# Patient Record
Sex: Female | Born: 1935 | Race: White | Hispanic: No | Smoking: Never smoker
Health system: Southern US, Community
[De-identification: ages and names within clinical notes are randomized; demographics above are authoritative.]

## PROBLEM LIST (undated history)

## (undated) DIAGNOSIS — C3490 Malignant neoplasm of unspecified part of unspecified bronchus or lung: Secondary | ICD-10-CM

## (undated) DIAGNOSIS — F410 Panic disorder [episodic paroxysmal anxiety] without agoraphobia: Secondary | ICD-10-CM

## (undated) DIAGNOSIS — E785 Hyperlipidemia, unspecified: Secondary | ICD-10-CM

## (undated) DIAGNOSIS — M1990 Unspecified osteoarthritis, unspecified site: Secondary | ICD-10-CM

## (undated) DIAGNOSIS — T4145XA Adverse effect of unspecified anesthetic, initial encounter: Secondary | ICD-10-CM

## (undated) DIAGNOSIS — T8859XA Other complications of anesthesia, initial encounter: Secondary | ICD-10-CM

## (undated) DIAGNOSIS — C44301 Unspecified malignant neoplasm of skin of nose: Secondary | ICD-10-CM

## (undated) DIAGNOSIS — I219 Acute myocardial infarction, unspecified: Secondary | ICD-10-CM

## (undated) DIAGNOSIS — J189 Pneumonia, unspecified organism: Secondary | ICD-10-CM

## (undated) DIAGNOSIS — I10 Essential (primary) hypertension: Secondary | ICD-10-CM

## (undated) DIAGNOSIS — I447 Left bundle-branch block, unspecified: Secondary | ICD-10-CM

## (undated) DIAGNOSIS — R918 Other nonspecific abnormal finding of lung field: Secondary | ICD-10-CM

## (undated) DIAGNOSIS — Z952 Presence of prosthetic heart valve: Secondary | ICD-10-CM

## (undated) DIAGNOSIS — S2239XA Fracture of one rib, unspecified side, initial encounter for closed fracture: Secondary | ICD-10-CM

## (undated) DIAGNOSIS — J449 Chronic obstructive pulmonary disease, unspecified: Secondary | ICD-10-CM

## (undated) HISTORY — PX: CARDIAC SURGERY: SHX584

## (undated) HISTORY — PX: TONSILLECTOMY: SUR1361

## (undated) HISTORY — PX: SKIN CANCER EXCISION: SHX779

## (undated) HISTORY — PX: BYPASS GRAFT: SHX909

## (undated) HISTORY — PX: CARDIAC VALVE REPLACEMENT: SHX585

## (undated) HISTORY — PX: AORTIC VALVE REPLACEMENT: SHX41

---

## 1957-08-23 HISTORY — PX: CARPAL TUNNEL RELEASE: SHX101

## 1961-08-23 HISTORY — PX: DILATION AND CURETTAGE OF UTERUS: SHX78

## 2011-08-24 DIAGNOSIS — C3490 Malignant neoplasm of unspecified part of unspecified bronchus or lung: Secondary | ICD-10-CM

## 2011-08-24 HISTORY — PX: LOBECTOMY: SHX5089

## 2011-08-24 HISTORY — DX: Malignant neoplasm of unspecified part of unspecified bronchus or lung: C34.90

## 2012-02-13 ENCOUNTER — Encounter (HOSPITAL_BASED_OUTPATIENT_CLINIC_OR_DEPARTMENT_OTHER): Payer: Self-pay | Admitting: *Deleted

## 2012-02-13 ENCOUNTER — Emergency Department (HOSPITAL_BASED_OUTPATIENT_CLINIC_OR_DEPARTMENT_OTHER)
Admission: EM | Admit: 2012-02-13 | Discharge: 2012-02-13 | Disposition: A | Discharge status: Other Acute Inpatient Hospital | Payer: Medicare Other | Attending: Emergency Medicine | Admitting: Emergency Medicine

## 2012-02-13 ENCOUNTER — Emergency Department (HOSPITAL_BASED_OUTPATIENT_CLINIC_OR_DEPARTMENT_OTHER): Payer: Medicare Other

## 2012-02-13 DIAGNOSIS — J45901 Unspecified asthma with (acute) exacerbation: Secondary | ICD-10-CM | POA: Insufficient documentation

## 2012-02-13 DIAGNOSIS — I10 Essential (primary) hypertension: Secondary | ICD-10-CM | POA: Insufficient documentation

## 2012-02-13 DIAGNOSIS — E785 Hyperlipidemia, unspecified: Secondary | ICD-10-CM | POA: Insufficient documentation

## 2012-02-13 DIAGNOSIS — Z7982 Long term (current) use of aspirin: Secondary | ICD-10-CM | POA: Insufficient documentation

## 2012-02-13 DIAGNOSIS — Z79899 Other long term (current) drug therapy: Secondary | ICD-10-CM | POA: Insufficient documentation

## 2012-02-13 DIAGNOSIS — D3A090 Benign carcinoid tumor of the bronchus and lung: Secondary | ICD-10-CM | POA: Insufficient documentation

## 2012-02-13 HISTORY — DX: Other nonspecific abnormal finding of lung field: R91.8

## 2012-02-13 HISTORY — DX: Essential (primary) hypertension: I10

## 2012-02-13 HISTORY — DX: Presence of prosthetic heart valve: Z95.2

## 2012-02-13 HISTORY — DX: Hyperlipidemia, unspecified: E78.5

## 2012-02-13 HISTORY — DX: Fracture of one rib, unspecified side, initial encounter for closed fracture: S22.39XA

## 2012-02-13 HISTORY — DX: Acute myocardial infarction, unspecified: I21.9

## 2012-02-13 HISTORY — DX: Panic disorder (episodic paroxysmal anxiety): F41.0

## 2012-02-13 LAB — BASIC METABOLIC PANEL
BUN: 16 mg/dL (ref 6–23)
Calcium: 9.8 mg/dL (ref 8.4–10.5)
GFR calc Af Amer: 82 mL/min — ABNORMAL LOW (ref >90)
GFR calc non Af Amer: 70 mL/min — ABNORMAL LOW (ref >90)
Potassium: 4.1 mEq/L (ref 3.5–5.1)
Sodium: 139 mEq/L (ref 135–145)

## 2012-02-13 LAB — DIFFERENTIAL
Basophils Relative: 0 % (ref 0–1)
Eosinophils Absolute: 0.3 10*3/uL (ref 0.0–0.7)
Monocytes Relative: 8 % (ref 3–12)
Neutrophils Relative %: 68 % (ref 43–77)

## 2012-02-13 LAB — CBC
MCH: 30.1 pg (ref 26.0–34.0)
MCHC: 34.3 g/dL (ref 30.0–36.0)
Platelets: 225 10*3/uL (ref 150–400)

## 2012-02-13 MED ORDER — ALBUTEROL (5 MG/ML) CONTINUOUS INHALATION SOLN
10.0000 mg/h | INHALATION_SOLUTION | RESPIRATORY_TRACT | Status: AC
  Administered 2012-02-13: 10 mg/h via RESPIRATORY_TRACT

## 2012-02-13 MED ORDER — ALBUTEROL SULFATE (5 MG/ML) 0.5% IN NEBU
5.0000 mg | INHALATION_SOLUTION | Freq: Once | RESPIRATORY_TRACT | Status: AC
  Administered 2012-02-13: 5 mg via RESPIRATORY_TRACT
  Filled 2012-02-13: qty 1

## 2012-02-13 MED ORDER — IPRATROPIUM BROMIDE 0.02 % IN SOLN
RESPIRATORY_TRACT | Status: AC
  Administered 2012-02-13: 0.5 mg
  Filled 2012-02-13: qty 2.5

## 2012-02-13 MED ORDER — ALBUTEROL SULFATE (5 MG/ML) 0.5% IN NEBU
INHALATION_SOLUTION | RESPIRATORY_TRACT | Status: AC
  Filled 2012-02-13: qty 1

## 2012-02-13 MED ORDER — ALBUTEROL SULFATE (5 MG/ML) 0.5% IN NEBU
INHALATION_SOLUTION | RESPIRATORY_TRACT | Status: AC
  Filled 2012-02-13: qty 2

## 2012-02-13 MED ORDER — METHYLPREDNISOLONE SODIUM SUCC 125 MG IJ SOLR
125.0000 mg | Freq: Once | INTRAMUSCULAR | Status: AC
  Administered 2012-02-13: 125 mg via INTRAVENOUS
  Filled 2012-02-13: qty 2

## 2012-02-13 MED ORDER — ALBUTEROL SULFATE (5 MG/ML) 0.5% IN NEBU
5.0000 mg | INHALATION_SOLUTION | Freq: Once | RESPIRATORY_TRACT | Status: AC
  Administered 2012-02-13: 5 mg via RESPIRATORY_TRACT

## 2012-02-13 MED ORDER — LORAZEPAM 1 MG PO TABS
1.0000 mg | ORAL_TABLET | Freq: Once | ORAL | Status: AC
  Administered 2012-02-13: 1 mg via ORAL
  Filled 2012-02-13: qty 1

## 2012-02-13 NOTE — ED Notes (Addendum)
Walked unit off on room air with Portable Pulse OX.  HR 105, SpO2 92% at lowest, RR 24-26 while walking, BBS wheezes

## 2012-02-13 NOTE — ED Provider Notes (Signed)
History     CSN: [id number]  Arrival date & time 02/13/12  1107   First MD Initiated Contact with Patient 02/13/12 1114      Chief Complaint  Patient presents with  . 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 She has had some SOB over the last several days, worse this morning. She denies any cough or fever. She has no prior history of asthma but states she has had some baseline SOB for years improved with albuterol during recent PFTs. No CP, vomiting, diarrhea.   Past Medical History  Diagnosis Date  . Hypertension   . Hyperlipidemia   . Panic attack   . Lung mass   . Cancer   . Heart valve replaced   . Rib fracture     Past Surgical History  Procedure Date  . Tonsillectomy   . Valve replacement     History reviewed. No pertinent family history.  History  Substance Use Topics  . Smoking status: Never Smoker   . Smokeless tobacco: Never Used  . Alcohol Use: Yes     glass of wine every couple of weeks    OB History    Grav Para Term Preterm Abortions TAB SAB Ect Mult Living                  Review of Systems All other systems reviewed and are negative except as noted in HPI.   Allergies  Fig extract  Home Medications   Current Outpatient Rx  Name Route Sig Dispense Refill  . ASPIRIN 81 MG PO CHEW Oral Chew 81 mg by mouth daily.    Marland Kitchen LISINOPRIL-HYDROCHLOROTHIAZIDE 20-12.5 MG PO TABS Oral Take 1 tablet by mouth daily.    Marland Kitchen ROSUVASTATIN CALCIUM 20 MG PO TABS Oral Take 20 mg by mouth daily.    Marland Kitchen ZOLPIDEM TARTRATE 5 MG PO TABS Oral Take 7 mg by mouth at bedtime as needed.      BP 175/84  Pulse 80  Temp 97.7 F (36.5 C) (Oral)  Resp 28  Ht 5\' 4"  (1.626 m)  Wt 170 lb (77.111 kg)  BMI 29.18 kg/m2  SpO2 100%  Physical Exam  Nursing note and vitals  reviewed. Constitutional: She is oriented to person, place, and time. She appears well-developed and well-nourished.  HENT:  Head: Normocephalic and atraumatic.  Eyes: EOM are normal. Pupils are equal, round, and reactive to light.  Neck: Normal range of motion. Neck supple.  Cardiovascular: Normal rate, normal heart sounds and intact distal pulses.   Pulmonary/Chest: Tachypnea noted. She is in respiratory distress. She has wheezes. She has no rales. She exhibits no tenderness.  Abdominal: Bowel sounds are normal. She exhibits no distension. There is no tenderness.  Musculoskeletal: Normal range of motion. She exhibits no edema and no tenderness.  Neurological: She is alert and oriented to person, place, and time. She has normal strength. No cranial nerve deficit or sensory deficit.  Skin: Skin is warm and dry. No rash noted.  Psychiatric: She has a normal mood and affect.    ED Course  Procedures (including critical care time)  Labs Reviewed  BASIC METABOLIC PANEL - Abnormal; Notable for the following:    Glucose, Bld 103 (*)     GFR calc non Af Amer 70 (*)     GFR calc Af Amer 82 (*)     All other components within  normal limits  CBC  DIFFERENTIAL   Dg Chest 2 View  02/13/2012  *RADIOLOGY REPORT*  Clinical Data: Shortness of breath. Recently diagnosed with carcinoid tumor.  CHEST - 2 VIEW  Comparison: None.  Findings: There is a rounded 2.5 cm mass in the left lower lobe likely reflecting the patient's carcinoid tumor.  No infiltrates or effusions.  Mild bronchitic lung changes.  The cardiac silhouette, mediastinal and hilar contours are normal.  There are surgical changes from bypass surgery and valve replacement surgery.  The bony thorax is intact.  IMPRESSION: 2.5 cm left lower lobe lung mass. No acute cardiopulmonary findings.  Original Report Authenticated By: P. Loralie Champagne, M.D.     No diagnosis found.    MDM  Pt given 5mg  neb and 10mg  CAT along with solumedrol and  now feeling better. Will check room air sats.     2:49 PM Pt has sats drop to 92% on RA while ambulating and became short of breath again. Will admit for further treatment. Spoke with Dr. Benard Rink at [facility] who will accept for transfer.     Cordelle Dahmen B. Bernette Mayers, MD 02/13/12 [phone]

## 2012-02-13 NOTE — ED Notes (Signed)
Pt reports SOB since this am. States that she was prescribed an inhaler this past Friday and used it twice with some relief. States she woke up this am with extreme SOB. Pt reports being diagnosed recently with a malignant mass on her lung. Is supposed to go to her MD for follow up.

## 2012-04-28 ENCOUNTER — Encounter (HOSPITAL_BASED_OUTPATIENT_CLINIC_OR_DEPARTMENT_OTHER): Payer: Self-pay | Admitting: *Deleted

## 2012-04-28 ENCOUNTER — Emergency Department (HOSPITAL_BASED_OUTPATIENT_CLINIC_OR_DEPARTMENT_OTHER)
Admission: EM | Admit: 2012-04-28 | Discharge: 2012-04-28 | Disposition: A | Discharge status: Home / Self Care | Payer: Medicare Other | Attending: Emergency Medicine | Admitting: Emergency Medicine

## 2012-04-28 ENCOUNTER — Emergency Department (HOSPITAL_BASED_OUTPATIENT_CLINIC_OR_DEPARTMENT_OTHER): Payer: Medicare Other

## 2012-04-28 DIAGNOSIS — Z85118 Personal history of other malignant neoplasm of bronchus and lung: Secondary | ICD-10-CM | POA: Insufficient documentation

## 2012-04-28 DIAGNOSIS — M549 Dorsalgia, unspecified: Secondary | ICD-10-CM | POA: Insufficient documentation

## 2012-04-28 DIAGNOSIS — Z951 Presence of aortocoronary bypass graft: Secondary | ICD-10-CM | POA: Insufficient documentation

## 2012-04-28 DIAGNOSIS — K3189 Other diseases of stomach and duodenum: Secondary | ICD-10-CM | POA: Insufficient documentation

## 2012-04-28 DIAGNOSIS — R110 Nausea: Secondary | ICD-10-CM | POA: Insufficient documentation

## 2012-04-28 DIAGNOSIS — Z954 Presence of other heart-valve replacement: Secondary | ICD-10-CM | POA: Insufficient documentation

## 2012-04-28 DIAGNOSIS — Z9889 Other specified postprocedural states: Secondary | ICD-10-CM | POA: Insufficient documentation

## 2012-04-28 DIAGNOSIS — R1013 Epigastric pain: Secondary | ICD-10-CM

## 2012-04-28 DIAGNOSIS — I10 Essential (primary) hypertension: Secondary | ICD-10-CM | POA: Insufficient documentation

## 2012-04-28 DIAGNOSIS — R109 Unspecified abdominal pain: Secondary | ICD-10-CM

## 2012-04-28 HISTORY — DX: Malignant neoplasm of unspecified part of unspecified bronchus or lung: C34.90

## 2012-04-28 LAB — CBC WITH DIFFERENTIAL/PLATELET
Basophils Absolute: 0.1 10*3/uL (ref 0.0–0.1)
Basophils Relative: 1 % (ref 0–1)
Eosinophils Absolute: 0.4 10*3/uL (ref 0.0–0.7)
Eosinophils Relative: 6 % — ABNORMAL HIGH (ref 0–5)
Lymphocytes Relative: 21 % (ref 12–46)
MCHC: 34.2 g/dL (ref 30.0–36.0)
MCV: 88.1 fL (ref 78.0–100.0)
Platelets: 242 10*3/uL (ref 150–400)
RDW: 13 % (ref 11.5–15.5)
WBC: 6.7 10*3/uL (ref 4.0–10.5)

## 2012-04-28 LAB — COMPREHENSIVE METABOLIC PANEL
ALT: 12 U/L (ref 0–35)
AST: 19 U/L (ref 0–37)
Albumin: 3.7 g/dL (ref 3.5–5.2)
CO2: 22 mEq/L (ref 19–32)
Calcium: 9.3 mg/dL (ref 8.4–10.5)
Creatinine, Ser: 0.8 mg/dL (ref 0.50–1.10)
Sodium: 131 mEq/L — ABNORMAL LOW (ref 135–145)
Total Protein: 6.2 g/dL (ref 6.0–8.3)

## 2012-04-28 MED ORDER — FLEET ENEMA 7-19 GM/118ML RE ENEM: 1.0000 | ENEMA | Freq: Once | RECTAL | Status: DC

## 2012-04-28 MED ORDER — ONDANSETRON HCL 4 MG/2ML IJ SOLN
4.0000 mg | Freq: Once | INTRAMUSCULAR | Status: AC
  Administered 2012-04-28: 4 mg via INTRAVENOUS

## 2012-04-28 MED ORDER — MILK AND MOLASSES ENEMA
Freq: Once | RECTAL | Status: DC
  Filled 2012-04-28: qty 250

## 2012-04-28 MED ORDER — SODIUM CHLORIDE 0.9 % IV BOLUS (SEPSIS)
500.0000 mL | Freq: Once | INTRAVENOUS | Status: AC
  Administered 2012-04-28: 1000 mL via INTRAVENOUS

## 2012-04-28 MED ORDER — FAMOTIDINE 20 MG PO TABS
40.0000 mg | ORAL_TABLET | Freq: Once | ORAL | Status: AC
  Administered 2012-04-28: 40 mg via ORAL
  Filled 2012-04-28: qty 1

## 2012-04-28 MED ORDER — FLEET ENEMA 7-19 GM/118ML RE ENEM
ENEMA | RECTAL | Status: AC
  Administered 2012-04-28: 1
  Filled 2012-04-28: qty 1

## 2012-04-28 MED ORDER — IOHEXOL 300 MG/ML  SOLN
100.0000 mL | Freq: Once | INTRAMUSCULAR | Status: AC | PRN
  Administered 2012-04-28: 100 mL via INTRAVENOUS

## 2012-04-28 MED ORDER — ONDANSETRON HCL 4 MG/2ML IJ SOLN
INTRAMUSCULAR | Status: AC
  Filled 2012-04-28: qty 2

## 2012-04-28 MED ORDER — FAMOTIDINE 20 MG PO TABS: 20.0000 mg | ORAL_TABLET | Freq: Two times a day (BID) | ORAL | Status: DC

## 2012-04-28 MED ORDER — ALUM & MAG HYDROXIDE-SIMETH 200-200-20 MG/5ML PO SUSP
30.0000 mL | Freq: Once | ORAL | Status: AC
  Administered 2012-04-28: 30 mL via ORAL
  Filled 2012-04-28: qty 30

## 2012-04-28 NOTE — ED Provider Notes (Signed)
History     CSN: [id number]  Arrival date & time 04/28/12  1404   First MD Initiated Contact with Patient 04/28/12 1447      Chief Complaint  Patient presents with  . Abdominal Pain    (Consider location/radiation/quality/duration/timing/severity/associated sxs/prior treatment) HPI Hartlee Amedee is a 77 y.o. female is a pertinent medical history of carcinoid tumor that was removed 3 weeks ago today at [facility]. It was removed by VATS procedure. Her recovery has been uneventful, she has had decreasing pain and scope sites and incisions.  She does have some persistent pain near the left-sided scope site inferior lateral to her scapula, and some mild tenderness to the main incision site between her ribs, the pain here is been resolving slowly. Patient has been taking oxycodone for pain over the last 3 weeks has been on a bowel regimen and docusate and MiraLax once daily. Patient has not had a satisfying bowel movement in the last 3 weeks. She has had this epigastric pain constantly since being hospitalized 3 weeks ago. Is been mild to moderate every day - however today got a little bit worse and her daughter decided to come to the emergency department. It is a burning sensation it doesn't ascend to the center of the chest, it is be associated with eating. The pain is not radiate anywhere else, patient has not taken anything for reflux, and has not received relief from her regular medicines. She does have some associated nausea with the burning sensation in the epigastric region. She denies any shortness of breath, diaphoresis, radiation to the arms with this pain.   Past Medical History  Diagnosis Date  . Hypertension   . Hyperlipidemia   . Panic attack   . Lung mass   . Cancer   . Heart valve replaced   . Rib fracture   . Myocardial infarct   . Lung cancer     Past Surgical History  Procedure Date  . Tonsillectomy   . Valve replacement   . Cardiac surgery   . Bypass graft   .  Lobectomy     History reviewed. No pertinent family history.  History  Substance Use Topics  . Smoking status: Never Smoker   . Smokeless tobacco: Never Used  . Alcohol Use: Yes     glass of wine every couple of weeks    OB History    Grav Para Term Preterm Abortions TAB SAB Ect Mult Living                  Review of Systems  Allergies  Fig extract and Sulfa antibiotics  Home Medications   Current Outpatient Rx  Name Route Sig Dispense Refill  . DOCUSATE SODIUM 100 MG PO CAPS Oral Take 100 mg by mouth 2 (two) times daily.    Marland Kitchen ONDANSETRON HCL 4 MG PO TABS Oral Take 4 mg by mouth every 8 (eight) hours as needed.    . OXYCODONE HCL ER 10 MG PO TB12 Oral Take 10 mg by mouth every 12 (twelve) hours.    . ALBUTEROL SULFATE HFA 108 (90 BASE) MCG/ACT IN AERS Inhalation Inhale 2 puffs into the lungs every 6 (six) hours as needed. Patient uses 2 puffs of this medication at 6 am.    . ASPIRIN 81 MG PO CHEW Oral Chew 81 mg by mouth daily.    Marland Kitchen LISINOPRIL-HYDROCHLOROTHIAZIDE 20-12.5 MG PO TABS Oral Take 1 tablet by mouth daily.    Marland Kitchen ROSUVASTATIN CALCIUM  20 MG PO TABS Oral Take 20 mg by mouth daily.    Marland Kitchen ZOLPIDEM TARTRATE 5 MG PO TABS Oral Take 5 mg by mouth at bedtime as needed. Patient is using 7.5 milligrams of this medication.      BP 120/63  Pulse 77  Temp 97.9 F (36.6 C)  Resp 16  Ht 5\' 3"  (1.6 m)  Wt 168 lb (76.204 kg)  BMI 29.76 kg/m2  SpO2 100%  Physical Exam VITAL SIGNS:   Filed Vitals:   04/28/12 1409  BP: 120/63  Pulse: 77  Temp: 97.9 F (36.6 C)  Resp: 16   CONSTITUTIONAL: Awake, oriented, appears non-toxic HENT: Atraumatic, normocephalic, oral mucosa pink and moist, airway patent. Nares patent without drainage. External ears normal. EYES: Conjunctiva clear, EOMI, PERRLA NECK: Trachea midline, non-tender, supple CARDIOVASCULAR: Normal heart rate, Normal rhythm PULMONARY/CHEST: Clear to auscultation, no rhonchi, wheezes, or rales. Well-healed median  sternotomy scar. Very mild decrease breath sounds on the left base.. 2 cm incision inferior lateral to the left scapula well-healed and mildly tender to palpation. About 5 cm well-healed incision in the midaxillary line about the fifth rib well-healed mildly tender to palpation. ABDOMINAL: Non-distended, soft, mildly tender in the left lower quadrant - no rebound or guarding.  BS decreased. NEUROLOGIC: Non-focal, moving all four extremities, no gross sensory or motor deficits. EXTREMITIES: No clubbing, cyanosis, or edema SKIN: Warm, Dry, No erythema, No rash  ED Course  Procedures (including critical care time)  Labs Reviewed  CBC WITH DIFFERENTIAL - Abnormal; Notable for the following:    RBC 3.69 (*)     Hemoglobin 11.1 (*)     HCT 32.5 (*)     Eosinophils Relative 6 (*)     All other components within normal limits  COMPREHENSIVE METABOLIC PANEL - Abnormal; Notable for the following:    Sodium 131 (*)     Potassium 3.3 (*)     Glucose, Bld 128 (*)     GFR calc non Af Amer 70 (*)     GFR calc Af Amer 82 (*)     All other components within normal limits  TROPONIN I  LIPASE, BLOOD  LACTIC ACID, PLASMA  BILIRUBIN, DIRECT   No results found.   No diagnosis found.    MDM  Daryle Boyington is a 77 y.o. female presenting with epigastric pain and some incisional site pain. She says her incisional sites have decreased in pain since his surgery, but she's been concerned about the epigastric pain, and associated nausea which is typically resolved with Zofran but today was not. Patient has not had adequate bowel movements per her report. Patient is likely constipated. She is slightly tender in the left lower quadrant and she has slowed bowel sounds which are consistent with opioid use post op.  Pt likely is constipated but has a host of co-morbidities and is 77 years old increasing her risk for intra-abdominal problems, though her abdominal exam is benign, and my pre-test probability of  diverticulitis, viscus perforation, colitis, is extremely low.  Patient does have a heart history of MI, and does present with a left bundle branch block however her EKG shows no change from prior EKG done at [facility] 03/28/2012.  This epigastric pain could be representative of an atypical presentation of MI especially in an elderly female however this is been going on for 3 weeks, the patient's vital signs have been stable and within normal limits, and my suspicion for ACS at this point is extremely  low also.  EKG shows: A sinus rhythm 79 beats per minute with a left bundle branch block pattern the pattern is appropriately discordant she's having no chest pain makes me think this is ACS, and does not meet Sgarbossa criteria. QRS is borderline at 110 ms, otherwise normal axis normal intervals.  Patient's labs are relatively unremarkable mild anemia hemoglobin of 11.1, troponin is negative, mild decrease in sodium and potassium which may be due to dietary ounces the patient says she's not been eating very much due to her nausea and pain that occurs about 30 minutes after she eats.  Chest x-ray shows redemonstrated CABG and bowel replacement as well as small left pleural effusion with left basilar atelectasis.  I do not think this atelectasis represents consolidation.  CT is nonacute. Diverticulosis without diverticulitis, no acute inflammation. Normal stool transverse colon. And redemonstrates a small left pleural effusion and cholelithiasis as seen on prior CT as faxed by [facility] records which have been reviewed.    Patient is feeling fine right now, we'll give her some Maalox and start her on Pepcid, she can followup with her physician to reevaluate her dyspepsia. We'll give her milk of molasses enema she should get some relief out of the stool in the transverse colon. Patient has not had any acute intrathoracic or intra-abdominal process at this time.    I explained the diagnosis and have given explicit  precautions to return to the ER including chest pain, dyspnea, worsening abdominal pain or any other new or worsening symptoms. The patient understands and accepts the medical plan as it's been dictated and I have answered their questions. Discharge instructions concerning home care and prescriptions have been given.  The patient is STABLE and is discharged to home in good condition.         Jones Skene, MD 04/28/12 1721

## 2012-04-28 NOTE — ED Notes (Addendum)
Pt c/o upper abd pain and upper back pain  , recent lobectomy x 3 weeks ago for CA , little BM x 3 weeks pt taking oxycontin

## 2014-09-23 HISTORY — PX: KNEE ARTHROSCOPY: SHX127

## 2016-09-16 ENCOUNTER — Inpatient Hospital Stay (HOSPITAL_COMMUNITY): Payer: Medicare Other

## 2016-09-16 ENCOUNTER — Encounter (HOSPITAL_BASED_OUTPATIENT_CLINIC_OR_DEPARTMENT_OTHER): Payer: Self-pay

## 2016-09-16 ENCOUNTER — Inpatient Hospital Stay (HOSPITAL_BASED_OUTPATIENT_CLINIC_OR_DEPARTMENT_OTHER)
Admission: EM | Admit: 2016-09-16 | Discharge: 2016-09-19 | DRG: 417 | Disposition: A | Discharge status: Home / Self Care | Payer: Medicare Other | Attending: Internal Medicine | Admitting: Internal Medicine

## 2016-09-16 ENCOUNTER — Emergency Department (HOSPITAL_BASED_OUTPATIENT_CLINIC_OR_DEPARTMENT_OTHER): Payer: Medicare Other

## 2016-09-16 DIAGNOSIS — Z953 Presence of xenogenic heart valve: Secondary | ICD-10-CM

## 2016-09-16 DIAGNOSIS — M1990 Unspecified osteoarthritis, unspecified site: Secondary | ICD-10-CM | POA: Diagnosis not present

## 2016-09-16 DIAGNOSIS — Z952 Presence of prosthetic heart valve: Secondary | ICD-10-CM

## 2016-09-16 DIAGNOSIS — D72829 Elevated white blood cell count, unspecified: Secondary | ICD-10-CM

## 2016-09-16 DIAGNOSIS — K8000 Calculus of gallbladder with acute cholecystitis without obstruction: Principal | ICD-10-CM | POA: Diagnosis present

## 2016-09-16 DIAGNOSIS — E784 Other hyperlipidemia: Secondary | ICD-10-CM | POA: Diagnosis not present

## 2016-09-16 DIAGNOSIS — Z7982 Long term (current) use of aspirin: Secondary | ICD-10-CM | POA: Diagnosis not present

## 2016-09-16 DIAGNOSIS — R739 Hyperglycemia, unspecified: Secondary | ICD-10-CM

## 2016-09-16 DIAGNOSIS — K859 Acute pancreatitis without necrosis or infection, unspecified: Secondary | ICD-10-CM | POA: Diagnosis present

## 2016-09-16 DIAGNOSIS — R1011 Right upper quadrant pain: Secondary | ICD-10-CM | POA: Diagnosis present

## 2016-09-16 DIAGNOSIS — Z882 Allergy status to sulfonamides status: Secondary | ICD-10-CM | POA: Diagnosis not present

## 2016-09-16 DIAGNOSIS — Z951 Presence of aortocoronary bypass graft: Secondary | ICD-10-CM | POA: Diagnosis not present

## 2016-09-16 DIAGNOSIS — Z91018 Allergy to other foods: Secondary | ICD-10-CM | POA: Diagnosis not present

## 2016-09-16 DIAGNOSIS — R262 Difficulty in walking, not elsewhere classified: Secondary | ICD-10-CM

## 2016-09-16 DIAGNOSIS — J439 Emphysema, unspecified: Secondary | ICD-10-CM | POA: Diagnosis not present

## 2016-09-16 DIAGNOSIS — Z85828 Personal history of other malignant neoplasm of skin: Secondary | ICD-10-CM | POA: Diagnosis not present

## 2016-09-16 DIAGNOSIS — I10 Essential (primary) hypertension: Secondary | ICD-10-CM | POA: Diagnosis not present

## 2016-09-16 DIAGNOSIS — N281 Cyst of kidney, acquired: Secondary | ICD-10-CM | POA: Diagnosis not present

## 2016-09-16 DIAGNOSIS — J449 Chronic obstructive pulmonary disease, unspecified: Secondary | ICD-10-CM

## 2016-09-16 DIAGNOSIS — Z7951 Long term (current) use of inhaled steroids: Secondary | ICD-10-CM | POA: Diagnosis not present

## 2016-09-16 DIAGNOSIS — R011 Cardiac murmur, unspecified: Secondary | ICD-10-CM | POA: Diagnosis not present

## 2016-09-16 DIAGNOSIS — Z01818 Encounter for other preprocedural examination: Secondary | ICD-10-CM

## 2016-09-16 DIAGNOSIS — K819 Cholecystitis, unspecified: Secondary | ICD-10-CM | POA: Diagnosis not present

## 2016-09-16 DIAGNOSIS — Z85118 Personal history of other malignant neoplasm of bronchus and lung: Secondary | ICD-10-CM

## 2016-09-16 DIAGNOSIS — I119 Hypertensive heart disease without heart failure: Secondary | ICD-10-CM | POA: Diagnosis present

## 2016-09-16 DIAGNOSIS — E785 Hyperlipidemia, unspecified: Secondary | ICD-10-CM | POA: Diagnosis not present

## 2016-09-16 DIAGNOSIS — R600 Localized edema: Secondary | ICD-10-CM | POA: Diagnosis not present

## 2016-09-16 DIAGNOSIS — I252 Old myocardial infarction: Secondary | ICD-10-CM | POA: Diagnosis not present

## 2016-09-16 HISTORY — DX: Pneumonia, unspecified organism: J18.9

## 2016-09-16 HISTORY — DX: Adverse effect of unspecified anesthetic, initial encounter: T41.45XA

## 2016-09-16 HISTORY — DX: Other complications of anesthesia, initial encounter: T88.59XA

## 2016-09-16 HISTORY — DX: Unspecified malignant neoplasm of skin of nose: C44.301

## 2016-09-16 HISTORY — DX: Unspecified osteoarthritis, unspecified site: M19.90

## 2016-09-16 HISTORY — DX: Chronic obstructive pulmonary disease, unspecified: J44.9

## 2016-09-16 LAB — COMPREHENSIVE METABOLIC PANEL
ALBUMIN: 4.1 g/dL (ref 3.5–5.0)
ALT: 24 U/L (ref 14–54)
AST: 29 U/L (ref 15–41)
Alkaline Phosphatase: 76 U/L (ref 38–126)
Anion gap: 9 (ref 5–15)
BUN: 16 mg/dL (ref 6–20)
CHLORIDE: 101 mmol/L (ref 101–111)
CO2: 22 mmol/L (ref 22–32)
CREATININE: 0.84 mg/dL (ref 0.44–1.00)
Calcium: 9 mg/dL (ref 8.9–10.3)
GFR calc Af Amer: >60 mL/min (ref >60)
GLUCOSE: 157 mg/dL — AB (ref 65–99)
POTASSIUM: 3.6 mmol/L (ref 3.5–5.1)
Sodium: 132 mmol/L — ABNORMAL LOW (ref 135–145)
Total Bilirubin: 1 mg/dL (ref 0.3–1.2)
Total Protein: 6.9 g/dL (ref 6.5–8.1)

## 2016-09-16 LAB — BASIC METABOLIC PANEL
Anion gap: 10 (ref 5–15)
BUN: 11 mg/dL (ref 6–20)
CHLORIDE: 102 mmol/L (ref 101–111)
CO2: 21 mmol/L — ABNORMAL LOW (ref 22–32)
Calcium: 8.5 mg/dL — ABNORMAL LOW (ref 8.9–10.3)
Creatinine, Ser: 0.79 mg/dL (ref 0.44–1.00)
GFR calc Af Amer: >60 mL/min (ref >60)
GFR calc non Af Amer: >60 mL/min (ref >60)
Glucose, Bld: 145 mg/dL — ABNORMAL HIGH (ref 65–99)
POTASSIUM: 3.8 mmol/L (ref 3.5–5.1)
SODIUM: 133 mmol/L — AB (ref 135–145)

## 2016-09-16 LAB — CBC
HEMATOCRIT: 40.4 % (ref 36.0–46.0)
Hemoglobin: 13.8 g/dL (ref 12.0–15.0)
MCH: 30.3 pg (ref 26.0–34.0)
MCHC: 34.2 g/dL (ref 30.0–36.0)
MCV: 88.6 fL (ref 78.0–100.0)
Platelets: 175 10*3/uL (ref 150–400)
RBC: 4.56 MIL/uL (ref 3.87–5.11)
RDW: 13 % (ref 11.5–15.5)
WBC: 15.4 10*3/uL — AB (ref 4.0–10.5)

## 2016-09-16 LAB — GLUCOSE, CAPILLARY
GLUCOSE-CAPILLARY: 169 mg/dL — AB (ref 65–99)
Glucose-Capillary: 142 mg/dL — ABNORMAL HIGH (ref 65–99)

## 2016-09-16 LAB — PROTIME-INR
INR: 1.07
PROTHROMBIN TIME: 13.9 s (ref 11.4–15.2)

## 2016-09-16 LAB — CBC WITH DIFFERENTIAL/PLATELET
BASOS PCT: 0 %
Basophils Absolute: 0 10*3/uL (ref 0.0–0.1)
EOS PCT: 1 %
Eosinophils Absolute: 0.2 10*3/uL (ref 0.0–0.7)
HCT: 40.5 % (ref 36.0–46.0)
Hemoglobin: 13.9 g/dL (ref 12.0–15.0)
LYMPHS PCT: 9 %
Lymphs Abs: 1.3 10*3/uL (ref 0.7–4.0)
MCH: 30.5 pg (ref 26.0–34.0)
MCHC: 34.3 g/dL (ref 30.0–36.0)
MCV: 88.8 fL (ref 78.0–100.0)
Monocytes Absolute: 1.1 10*3/uL — ABNORMAL HIGH (ref 0.1–1.0)
Monocytes Relative: 7 %
NEUTROS ABS: 11.6 10*3/uL — AB (ref 1.7–7.7)
Neutrophils Relative %: 83 %
PLATELETS: 178 10*3/uL (ref 150–400)
RBC: 4.56 MIL/uL (ref 3.87–5.11)
RDW: 13 % (ref 11.5–15.5)
WBC: 14.2 10*3/uL — AB (ref 4.0–10.5)

## 2016-09-16 LAB — TROPONIN I: TROPONIN I: 0.05 ng/mL — AB (ref <0.03)

## 2016-09-16 LAB — APTT: APTT: 31 s (ref 24–36)

## 2016-09-16 LAB — I-STAT CG4 LACTIC ACID, ED: Lactic Acid, Venous: 1.08 mmol/L (ref 0.5–1.9)

## 2016-09-16 LAB — LIPASE, BLOOD: Lipase: 20 U/L (ref 11–51)

## 2016-09-16 LAB — BRAIN NATRIURETIC PEPTIDE: B Natriuretic Peptide: 471 pg/mL — ABNORMAL HIGH (ref 0.0–100.0)

## 2016-09-16 MED ORDER — SODIUM CHLORIDE 0.9 % IV SOLN
INTRAVENOUS | Status: DC
  Administered 2016-09-17: 11:00:00 via INTRAVENOUS

## 2016-09-16 MED ORDER — MOMETASONE FURO-FORMOTEROL FUM 200-5 MCG/ACT IN AERO
2.0000 | INHALATION_SPRAY | Freq: Two times a day (BID) | RESPIRATORY_TRACT | Status: DC
  Filled 2016-09-16 (×2): qty 8.8

## 2016-09-16 MED ORDER — FENTANYL CITRATE (PF) 100 MCG/2ML IJ SOLN
50.0000 ug | INTRAMUSCULAR | Status: DC | PRN
  Administered 2016-09-16 – 2016-09-17 (×9): 50 ug via INTRAVENOUS
  Filled 2016-09-16 (×10): qty 2

## 2016-09-16 MED ORDER — SODIUM CHLORIDE 0.9 % IV SOLN
INTRAVENOUS | Status: AC
  Administered 2016-09-16: 16:00:00 via INTRAVENOUS

## 2016-09-16 MED ORDER — PIPERACILLIN-TAZOBACTAM 3.375 G IVPB
3.3750 g | Freq: Three times a day (TID) | INTRAVENOUS | Status: DC
  Administered 2016-09-16 – 2016-09-19 (×8): 3.375 g via INTRAVENOUS
  Filled 2016-09-16 (×10): qty 50

## 2016-09-16 MED ORDER — TIOTROPIUM BROMIDE MONOHYDRATE 18 MCG IN CAPS
18.0000 ug | ORAL_CAPSULE | Freq: Every day | RESPIRATORY_TRACT | Status: DC
  Filled 2016-09-16 (×2): qty 5

## 2016-09-16 MED ORDER — ACETAMINOPHEN 325 MG PO TABS
650.0000 mg | ORAL_TABLET | Freq: Once | ORAL | Status: AC
  Administered 2016-09-16: 650 mg via ORAL
  Filled 2016-09-16: qty 2

## 2016-09-16 MED ORDER — ALBUTEROL SULFATE (2.5 MG/3ML) 0.083% IN NEBU: 2.5000 mg | INHALATION_SOLUTION | Freq: Four times a day (QID) | RESPIRATORY_TRACT | Status: DC | PRN

## 2016-09-16 MED ORDER — METRONIDAZOLE IN NACL 5-0.79 MG/ML-% IV SOLN
500.0000 mg | Freq: Once | INTRAVENOUS | Status: AC
  Administered 2016-09-16: 500 mg via INTRAVENOUS
  Filled 2016-09-16: qty 100

## 2016-09-16 MED ORDER — SODIUM CHLORIDE 0.9% FLUSH
3.0000 mL | Freq: Two times a day (BID) | INTRAVENOUS | Status: DC
  Administered 2016-09-17 – 2016-09-18 (×3): 3 mL via INTRAVENOUS

## 2016-09-16 MED ORDER — ONDANSETRON HCL 4 MG/2ML IJ SOLN
INTRAMUSCULAR | Status: AC
  Administered 2016-09-16: 4 mg
  Filled 2016-09-16: qty 2

## 2016-09-16 MED ORDER — FENTANYL CITRATE (PF) 100 MCG/2ML IJ SOLN
INTRAMUSCULAR | Status: AC
  Administered 2016-09-16: 100 ug
  Filled 2016-09-16: qty 2

## 2016-09-16 MED ORDER — LEVOFLOXACIN IN D5W 500 MG/100ML IV SOLN
500.0000 mg | Freq: Once | INTRAVENOUS | Status: AC
  Administered 2016-09-16: 500 mg via INTRAVENOUS
  Filled 2016-09-16: qty 100

## 2016-09-16 NOTE — Progress Notes (Signed)
CRITICAL VALUE STICKER  CRITICAL VALUE: Troponin 0.05  RECEIVER (on-site recipient of call):  DATE & TIME NOTIFIED: 09/16/16 1815  MESSENGER (representative from lab):  MD NOTIFIED: Marily Memos   TIME OF NOTIFICATION:1830  RESPONSE: none

## 2016-09-16 NOTE — Progress Notes (Signed)
Patient arrived from [place], alert and oriented, in moderate pain. Iv site intact, on room air, no family present at the moment. Patient oriented to room and staff, will continue to monitor.

## 2016-09-16 NOTE — ED Provider Notes (Signed)
[facility] Provider Note: Tracy Spurling, MD, [name]  CSN: [id number] MRN: [id number] ARRIVAL: 09/16/16 at [place]: [id number]   CHIEF COMPLAINT  Abdominal Pain   HISTORY OF PRESENT ILLNESS  Tracy Buck is a 82 y.o. female who has had some intermittent abdominal pain for about the last 48 hours. This was not severe until about an hour prior to arrival. The pain had her writhing and she described it is located in the upper abdomen, right upper quadrant and right lower quadrant. There was associated vomiting but no diarrhea. She was given IV Zofran and fentanyl prior to my evaluation with significant improvement in the pain. She now localizes the pain to the right upper quadrant radiating to back. Pain is worse with movement or palpation. She has no history of biliary colic and has not had a cholecystectomy.   Past Medical History:  Diagnosis Date  . Arthritis    "fingers, knees" (09/16/2016)  . Complication of anesthesia    "I often go into severe panic attacks when they try to bring me out and they have to put me back under; sometimes happens when they are starting the anesthesia also" (09/16/2016)  . COPD (chronic obstructive pulmonary disease) ([place])    "I think this has been recently dx'd" (09/16/2016)  . Heart valve replaced   . Hyperlipidemia   . Hypertension   . Lung cancer ([place]) 2013  . Lung mass   . Myocardial infarct   . Panic attack   . Pneumonia    "when I was very young" (09/16/2016)  . Rib fracture   . Skin cancer of nose     Past Surgical History:  Procedure Laterality Date  . AORTIC VALVE REPLACEMENT     "bovine valve"  . BYPASS GRAFT     does not know if she had CABG (09/16/2016)  . CARDIAC SURGERY     does not know if she had CABG (09/16/2016)  . CARDIAC VALVE REPLACEMENT    . CARPAL TUNNEL RELEASE Right 1959  . DILATION AND CURETTAGE OF UTERUS  1963  . KNEE ARTHROSCOPY Left 09/2014  . LOBECTOMY Left 2013   "took bottom lobe off"  .  SKIN CANCER EXCISION     "tip of my nose"  . TONSILLECTOMY      Family History  Problem Relation Age of Onset  . Transient ischemic attack Mother   . Colon cancer Father     Social History  Substance Use Topics  . Smoking status: Never Smoker  . Smokeless tobacco: Never Used  . Alcohol use Yes     Comment: 09/16/2016 "2-3 glasses of wine when at the beach; once/year"    Prior to Admission medications   Medication Sig Start Date End Date Taking? Authorizing Provider  acetaminophen (TYLENOL) 500 MG tablet Take 500 mg by mouth every 6 (six) hours as needed. For pain.   Yes Historical Provider, MD  albuterol (PROVENTIL HFA;VENTOLIN HFA) 108 (90 BASE) MCG/ACT inhaler Inhale 2 puffs into the lungs every 6 (six) hours as needed. Patient uses 2 puffs of this medication at 6 am.   Yes Historical Provider, MD  ALPRAZolam Duanne Moron) 0.25 MG tablet Take 0.25-0.375 mg by mouth daily as needed for anxiety. 08/20/16  Yes Historical Provider, MD  aspirin 81 MG chewable tablet Chew 81 mg by mouth daily.   Yes Historical Provider, MD  Fluticasone-Salmeterol (ADVAIR) 250-50 MCG/DOSE AEPB Inhale 1 puff into the lungs every 12 (twelve) hours.   Yes Historical Provider,  MD  lisinopril-hydrochlorothiazide (PRINZIDE,ZESTORETIC) 20-12.5 MG per tablet Take 0.5 tablets by mouth daily.    Yes Historical Provider, MD  loratadine (CLARITIN) 10 MG tablet Take 10 mg by mouth daily.   Yes Historical Provider, MD  rosuvastatin (CRESTOR) 20 MG tablet Take 20 mg by mouth daily.   Yes Historical Provider, MD  tiotropium (SPIRIVA HANDIHALER) 18 MCG inhalation capsule Place 18 mcg into inhaler and inhale daily.   Yes Historical Provider, MD  zolpidem (AMBIEN) 10 MG tablet Take 10 mg by mouth at bedtime as needed for sleep.   Yes Historical Provider, MD  albuterol (PROVENTIL) (2.5 MG/3ML) 0.083% nebulizer solution Take 2.5 mg by nebulization every 6 (six) hours as needed.    Historical Provider, MD  benzonatate (TESSALON) 200  MG capsule Take 200 mg by mouth 3 (three) times daily as needed. For cough.    Historical Provider, MD  calcium carbonate (TITRALAC) 420 MG CHEW Chew 420 mg by mouth daily as needed.    Historical Provider, MD  docusate sodium (COLACE) 100 MG capsule Take 100 mg by mouth 2 (two) times daily.    Historical Provider, MD  famotidine (PEPCID) 20 MG tablet Take 1 tablet (20 mg total) by mouth 2 (two) times daily. Patient not taking: Reported on 09/16/2016 04/28/12 09/16/16  Rhunette Croft, MD  ondansetron (ZOFRAN) 4 MG tablet Take 4 mg by mouth every 8 (eight) hours as needed.    Historical Provider, MD    Allergies Fig extract [ficus] and Sulfa antibiotics   REVIEW OF SYSTEMS  Negative except as noted here or in the History of Present Illness.   PHYSICAL EXAMINATION  Initial Vital Signs Blood pressure 170/75, temperature 97.7 F (36.5 C), temperature source Oral, resp. rate 20, height '5\' 4"'$  (1.626 m), weight 170 lb (77.1 kg), SpO2 99 %.  Examination General: Well-developed, well-nourished female in no acute distress; appearance consistent with age of record HENT: normocephalic; atraumatic Eyes: pupils equal, round and reactive to light; extraocular muscles intact Neck: supple Heart: regular rate and rhythm Lungs: clear to auscultation bilaterally Abdomen: soft; nondistended; right upper quadrant tenderness; no masses or hepatosplenomegaly; bowel sounds present Extremities: No deformity; full range of motion; pulses normal Neurologic: Awake, alert and oriented; motor function intact in all extremities and symmetric; no facial droop Skin: Warm and dry Psychiatric: Flat affect   RESULTS  Summary of this visit's results, reviewed by myself:   EKG Interpretation  Date/Time:    Ventricular Rate:    PR Interval:    QRS Duration:   QT Interval:    QTC Calculation:   R Axis:     Text Interpretation:        Laboratory Studies: Results for orders placed or performed during the  hospital encounter of 09/16/16 (from the past 24 hour(s))  CBC with Differential     Status: Abnormal   Collection Time: 09/16/16  4:40 AM  Result Value Ref Range   WBC 14.2 (H) 4.0 - 10.5 K/uL   RBC 4.56 3.87 - 5.11 MIL/uL   Hemoglobin 13.9 12.0 - 15.0 g/dL   HCT 40.5 36.0 - 46.0 %   MCV 88.8 78.0 - 100.0 fL   MCH 30.5 26.0 - 34.0 pg   MCHC 34.3 30.0 - 36.0 g/dL   RDW 13.0 11.5 - 15.5 %   Platelets 178 150 - 400 K/uL   Neutrophils Relative % 83 %   Neutro Abs 11.6 (H) 1.7 - 7.7 K/uL   Lymphocytes Relative 9 %  Lymphs Abs 1.3 0.7 - 4.0 K/uL   Monocytes Relative 7 %   Monocytes Absolute 1.1 (H) 0.1 - 1.0 K/uL   Eosinophils Relative 1 %   Eosinophils Absolute 0.2 0.0 - 0.7 K/uL   Basophils Relative 0 %   Basophils Absolute 0.0 0.0 - 0.1 K/uL  Comprehensive metabolic panel     Status: Abnormal   Collection Time: 09/16/16  4:40 AM  Result Value Ref Range   Sodium 132 (L) 135 - 145 mmol/L   Potassium 3.6 3.5 - 5.1 mmol/L   Chloride 101 101 - 111 mmol/L   CO2 22 22 - 32 mmol/L   Glucose, Bld 157 (H) 65 - 99 mg/dL   BUN 16 6 - 20 mg/dL   Creatinine, Ser 0.84 0.44 - 1.00 mg/dL   Calcium 9.0 8.9 - 10.3 mg/dL   Total Protein 6.9 6.5 - 8.1 g/dL   Albumin 4.1 3.5 - 5.0 g/dL   AST 29 15 - 41 U/L   ALT 24 14 - 54 U/L   Alkaline Phosphatase 76 38 - 126 U/L   Total Bilirubin 1.0 0.3 - 1.2 mg/dL   GFR calc non Af Amer >60 >60 mL/min   GFR calc Af Amer >60 >60 mL/min   Anion gap 9 5 - 15  Lipase, blood     Status: None   Collection Time: 09/16/16  4:40 AM  Result Value Ref Range   Lipase 20 11 - 51 U/L  I-Stat CG4 Lactic Acid, ED     Status: None   Collection Time: 09/16/16  4:51 AM  Result Value Ref Range   Lactic Acid, Venous 1.08 0.5 - 1.9 mmol/L  APTT     Status: None   Collection Time: 09/16/16  4:55 PM  Result Value Ref Range   aPTT 31 24 - 36 seconds  Protime-INR     Status: None   Collection Time: 09/16/16  4:55 PM  Result Value Ref Range   Prothrombin Time 13.9 11.4  - 15.2 seconds   INR 1.07   Troponin I     Status: Abnormal   Collection Time: 09/16/16  4:55 PM  Result Value Ref Range   Troponin I 0.05 (HH) <0.03 ng/mL  Brain natriuretic peptide     Status: Abnormal   Collection Time: 09/16/16  4:55 PM  Result Value Ref Range   B Natriuretic Peptide 471.0 (H) 0.0 - 100.0 pg/mL  Basic metabolic panel     Status: Abnormal   Collection Time: 09/16/16  4:55 PM  Result Value Ref Range   Sodium 133 (L) 135 - 145 mmol/L   Potassium 3.8 3.5 - 5.1 mmol/L   Chloride 102 101 - 111 mmol/L   CO2 21 (L) 22 - 32 mmol/L   Glucose, Bld 145 (H) 65 - 99 mg/dL   BUN 11 6 - 20 mg/dL   Creatinine, Ser 0.79 0.44 - 1.00 mg/dL   Calcium 8.5 (L) 8.9 - 10.3 mg/dL   GFR calc non Af Amer >60 >60 mL/min   GFR calc Af Amer >60 >60 mL/min   Anion gap 10 5 - 15  CBC     Status: Abnormal   Collection Time: 09/16/16  4:55 PM  Result Value Ref Range   WBC 15.4 (H) 4.0 - 10.5 K/uL   RBC 4.56 3.87 - 5.11 MIL/uL   Hemoglobin 13.8 12.0 - 15.0 g/dL   HCT 40.4 36.0 - 46.0 %   MCV 88.6 78.0 - 100.0 fL  MCH 30.3 26.0 - 34.0 pg   MCHC 34.2 30.0 - 36.0 g/dL   RDW 13.0 11.5 - 15.5 %   Platelets 175 150 - 400 K/uL  Glucose, capillary     Status: Abnormal   Collection Time: 09/16/16  5:10 PM  Result Value Ref Range   Glucose-Capillary 142 (H) 65 - 99 mg/dL   Comment 1 Notify RN   Glucose, capillary     Status: Abnormal   Collection Time: 09/16/16  9:12 PM  Result Value Ref Range   Glucose-Capillary 169 (H) 65 - 99 mg/dL   Imaging Studies: US Abdomen Complete  Result Date: 09/16/2016 CLINICAL DATA:  Zero severe right upper quadrant pain associated with nausea for the past 2 days. History of gallstones, hyperlipidemia, lung malignancy. EXAM: ABDOMEN ULTRASOUND COMPLETE COMPARISON:  Abdominal and pelvic CT scan of April 28, 2012. FINDINGS: The patient was unable to cooperate with deep breathing maneuvers which limits the study. Gallbladder: The gallbladder is adequately  distended. There are at least 2 echogenic mobile shadowing stones measuring up to 1.6 cm in diameter. Sludge is present as well. The gallbladder wall is top normal at 3 mm. There is a positive sonographic Murphy's sign reported by the technologist. Common bile duct: Diameter: 6 mm where visualized. Liver: The hepatic echotexture is normal. There is no discrete mass or ductal dilation. IVC: Bowel gas limits evaluation of the IVC. Bowel gas limits evaluation of the pancreas. Pancreas: Visualized portion unremarkable. Spleen: Size and appearance within normal limits. Right Kidney: Length: 10.4 cm. In the upper pole of the right kidney there is a cystic structure with a few internal echoes measuring 4.4 cm in diameter. A cyst was previously demonstrated on the 2013 CT scan. Left Kidney: Length: 11 cm. The echotexture is normal. There is a prominent column of Bertin. Abdominal aorta: Normal where visualized. Other findings: There is no ascites. IMPRESSION: Gallstones. Positive sonographic Murphy's sign and top-normal gallbladder wall thickness may reflect acute cholecystitis. Non-simple cystic structure in the upper pole of the right kidney. This has increased slightly in size since the CT scan of September 2013. Electronically Signed   By: David  Martinique M.D.   On: 09/16/2016 08:52   Dg Chest Port 1 View  Result Date: 09/16/2016 CLINICAL DATA:  Preoperative examination. History of hypertension, aortic valve replacement, previous MI, partial left lower lobectomy. EXAM: PORTABLE CHEST 1 VIEW COMPARISON:  Chest x-ray of September 16, 2016 FINDINGS: The lungs are adequately inflated. The cardiac apex is obscured on the current exam but was normal on the earlier study. The heart is normal in size. There is a prosthetic aortic valve. The sternal wires are intact. There is calcification in the wall of the aortic arch. The pulmonary vascularity is not clearly engorged. IMPRESSION: New obscuration of the left heart border since  this morning's acute abdominal series may reflect overlap of normal structures but acute lingular atelectasis is not excluded. Correlation with the patient's clinical and laboratory values is needed. No evidence of pulmonary edema. Thoracic aortic atherosclerosis. Electronically Signed   By: David  Martinique M.D.   On: 09/16/2016 17:12   Dg Abd Acute W/chest  Result Date: 09/16/2016 CLINICAL DATA:  Initial evaluation for periodic right upper and left upper quadrant pain, right flank pain, and right lower quadrant pain. EXAM: DG ABDOMEN ACUTE W/ 1V CHEST COMPARISON:  Prior radiograph from 04/28/2012. FINDINGS: Median sternotomy wires underlying surgical clips and prostatic valve noted. Mild cardiomegaly is stable. Mediastinal silhouette within normal limits. Aortic  atherosclerosis noted. Lungs normally inflated. Chronic coarsening of the interstitial markings. Blunting of left costophrenic angle felt to most likely reflect chronic pleural reaction/ scarring. No definite pleural effusion. No pulmonary edema. No focal infiltrates. No pneumothorax. Bowel gas pattern within normal limits without evidence for obstruction or ileus. No free air. No abnormal bowel wall thickening. Moderate amount of retained stool throughout the colon. No soft tissue mass or abnormal calcifications. Multilevel degenerate spondylolysis noted within the visualized spine. Osteoarthritic changes present about the hips. Osteopenia. No acute osseous abnormality. IMPRESSION: 1. Nonobstructive bowel gas pattern with no radiographic evidence for acute intra-abdominal process. 2. Moderate amount of retained stool throughout the colon, suggesting constipation. 3. Blunting of the left costophrenic angle, suspected to likely reflect chronic pleural reaction/scarring. No other active cardiopulmonary disease identified. Electronically Signed   By: Jeannine Boga M.D.   On: 09/16/2016 05:56    ED COURSE  Nursing notes and initial vitals signs,  including pulse oximetry, reviewed.  Vitals:   09/16/16 1414 09/16/16 1415 09/16/16 1548 09/16/16 2100  BP: 130/74  131/68 (!) 91/54  Pulse: 83 86 80 81  Resp: 20  18   Temp: 98.8 F (37.1 C)  99.4 F (37.4 C) 99.5 F (37.5 C)  TempSrc: Oral  Oral Oral  SpO2: 96% 94%  95%  Weight:      Height:        PROCEDURES    ED DIAGNOSES     ICD-9-CM ICD-10-CM   1. Cholecystitis 575.10 K81.9   2. Calculus of gallbladder with acute cholecystitis without obstruction 574.00 K80.00   3. Leukocytosis, unspecified type 288.60 D72.829   4. Pre-op evaluation V72.84 Z01.818 DG CHEST PORT 1 VIEW     DG CHEST PORT 1 VIEW       Shanon Rosser, MD 09/16/16 2238

## 2016-09-16 NOTE — ED Triage Notes (Signed)
Pt c/o upper abdominal pain radiating to URQ to [facility], states pain started Tuesday evening and increase in severity last night, too 3 ASA at 0315 with no relief

## 2016-09-16 NOTE — Consult Note (Signed)
Reason for Consult:cholecystitis Referring Physician: Dr. Jerilee Field is an 82 y.o. female.  HPI: The patient is a 82 year old female who comes in today secondary to a 2 day history of epigastric/right upper quadrant pain. Patient states that this began Tuesday night. She states that this will be continued. She states that it is localized to the right upper quadrant. Patient was seen at an outside ER and underwent an ultrasound which revealed gallstones and signs consistent with acute cholecystitis.   Patient was transferred to [facility] for surgical evaluation and further treatment.  Patient's has a history of valve replacement as well as CABG. Patient also had a left lower lobectomy for lung cancer.  Past Medical History:  Diagnosis Date  . Arthritis    "fingers, knees" (09/16/2016)  . Complication of anesthesia    "I often go into severe panic attacks when they try to bring me out and they have to put me back under; sometimes happens when they are starting the anesthesia also" (09/16/2016)  . COPD (chronic obstructive pulmonary disease) ([place])    "I think this has been recently dx'd" (09/16/2016)  . Heart valve replaced   . Hyperlipidemia   . Hypertension   . Lung cancer ([place]) 2013  . Lung mass   . Myocardial infarct   . Panic attack   . Pneumonia    "when I was very young" (09/16/2016)  . Rib fracture   . Skin cancer of nose     Past Surgical History:  Procedure Laterality Date  . AORTIC VALVE REPLACEMENT     "bovine valve"  . BYPASS GRAFT     does not know if she had CABG (09/16/2016)  . CARDIAC SURGERY     does not know if she had CABG (09/16/2016)  . CARDIAC VALVE REPLACEMENT    . CARPAL TUNNEL RELEASE Right 1959  . DILATION AND CURETTAGE OF UTERUS  1963  . KNEE ARTHROSCOPY Left 09/2014  . LOBECTOMY Left 2013   "took bottom lobe off"  . SKIN CANCER EXCISION     "tip of my nose"  . TONSILLECTOMY      Family History  Problem Relation Age of Onset  .  Transient ischemic attack Mother   . Colon cancer Father     Social History:  reports that she has never smoked. She has never used smokeless tobacco. She reports that she drinks alcohol. She reports that she does not use drugs.  Allergies:  Allergies  Allergen Reactions  . Fig Extract [Ficus] Anaphylaxis  . Sulfa Antibiotics Other (See Comments)    Severe nervousness.    Medications: I have reviewed the patient's current medications.  Results for orders placed or performed during the hospital encounter of 09/16/16 (from the past 48 hour(s))  CBC with Differential     Status: Abnormal   Collection Time: 09/16/16  4:40 AM  Result Value Ref Range   WBC 14.2 (H) 4.0 - 10.5 K/uL   RBC 4.56 3.87 - 5.11 MIL/uL   Hemoglobin 13.9 12.0 - 15.0 g/dL   HCT 40.5 36.0 - 46.0 %   MCV 88.8 78.0 - 100.0 fL   MCH 30.5 26.0 - 34.0 pg   MCHC 34.3 30.0 - 36.0 g/dL   RDW 13.0 11.5 - 15.5 %   Platelets 178 150 - 400 K/uL   Neutrophils Relative % 83 %   Neutro Abs 11.6 (H) 1.7 - 7.7 K/uL   Lymphocytes Relative 9 %   Lymphs Abs 1.3  0.7 - 4.0 K/uL   Monocytes Relative 7 %   Monocytes Absolute 1.1 (H) 0.1 - 1.0 K/uL   Eosinophils Relative 1 %   Eosinophils Absolute 0.2 0.0 - 0.7 K/uL   Basophils Relative 0 %   Basophils Absolute 0.0 0.0 - 0.1 K/uL  Comprehensive metabolic panel     Status: Abnormal   Collection Time: 09/16/16  4:40 AM  Result Value Ref Range   Sodium 132 (L) 135 - 145 mmol/L   Potassium 3.6 3.5 - 5.1 mmol/L   Chloride 101 101 - 111 mmol/L   CO2 22 22 - 32 mmol/L   Glucose, Bld 157 (H) 65 - 99 mg/dL   BUN 16 6 - 20 mg/dL   Creatinine, Ser 0.84 0.44 - 1.00 mg/dL   Calcium 9.0 8.9 - 10.3 mg/dL   Total Protein 6.9 6.5 - 8.1 g/dL   Albumin 4.1 3.5 - 5.0 g/dL   AST 29 15 - 41 U/L   ALT 24 14 - 54 U/L   Alkaline Phosphatase 76 38 - 126 U/L   Total Bilirubin 1.0 0.3 - 1.2 mg/dL   GFR calc non Af Amer >60 >60 mL/min   GFR calc Af Amer >60 >60 mL/min    Comment: (NOTE) The eGFR  has been calculated using the CKD EPI equation. This calculation has not been validated in all clinical situations. eGFR's persistently <60 mL/min signify possible Chronic Kidney Disease.    Anion gap 9 5 - 15  Lipase, blood     Status: None   Collection Time: 09/16/16  4:40 AM  Result Value Ref Range   Lipase 20 11 - 51 U/L  I-Stat CG4 Lactic Acid, ED     Status: None   Collection Time: 09/16/16  4:51 AM  Result Value Ref Range   Lactic Acid, Venous 1.08 0.5 - 1.9 mmol/L    US Abdomen Complete  Result Date: 09/16/2016 CLINICAL DATA:  Zero severe right upper quadrant pain associated with nausea for the past 2 days. History of gallstones, hyperlipidemia, lung malignancy. EXAM: ABDOMEN ULTRASOUND COMPLETE COMPARISON:  Abdominal and pelvic CT scan of April 28, 2012. FINDINGS: The patient was unable to cooperate with deep breathing maneuvers which limits the study. Gallbladder: The gallbladder is adequately distended. There are at least 2 echogenic mobile shadowing stones measuring up to 1.6 cm in diameter. Sludge is present as well. The gallbladder wall is top normal at 3 mm. There is a positive sonographic Murphy's sign reported by the technologist. Common bile duct: Diameter: 6 mm where visualized. Liver: The hepatic echotexture is normal. There is no discrete mass or ductal dilation. IVC: Bowel gas limits evaluation of the IVC. Bowel gas limits evaluation of the pancreas. Pancreas: Visualized portion unremarkable. Spleen: Size and appearance within normal limits. Right Kidney: Length: 10.4 cm. In the upper pole of the right kidney there is a cystic structure with a few internal echoes measuring 4.4 cm in diameter. A cyst was previously demonstrated on the 2013 CT scan. Left Kidney: Length: 11 cm. The echotexture is normal. There is a prominent column of Bertin. Abdominal aorta: Normal where visualized. Other findings: There is no ascites. IMPRESSION: Gallstones. Positive sonographic Murphy's  sign and top-normal gallbladder wall thickness may reflect acute cholecystitis. Non-simple cystic structure in the upper pole of the right kidney. This has increased slightly in size since the CT scan of September 2013. Electronically Signed   By: David  Martinique M.D.   On: 09/16/2016 08:52   Dg  Abd Acute W/chest  Result Date: 09/16/2016 CLINICAL DATA:  Initial evaluation for periodic right upper and left upper quadrant pain, right flank pain, and right lower quadrant pain. EXAM: DG ABDOMEN ACUTE W/ 1V CHEST COMPARISON:  Prior radiograph from 04/28/2012. FINDINGS: Median sternotomy wires underlying surgical clips and prostatic valve noted. Mild cardiomegaly is stable. Mediastinal silhouette within normal limits. Aortic atherosclerosis noted. Lungs normally inflated. Chronic coarsening of the interstitial markings. Blunting of left costophrenic angle felt to most likely reflect chronic pleural reaction/ scarring. No definite pleural effusion. No pulmonary edema. No focal infiltrates. No pneumothorax. Bowel gas pattern within normal limits without evidence for obstruction or ileus. No free air. No abnormal bowel wall thickening. Moderate amount of retained stool throughout the colon. No soft tissue mass or abnormal calcifications. Multilevel degenerate spondylolysis noted within the visualized spine. Osteoarthritic changes present about the hips. Osteopenia. No acute osseous abnormality. IMPRESSION: 1. Nonobstructive bowel gas pattern with no radiographic evidence for acute intra-abdominal process. 2. Moderate amount of retained stool throughout the colon, suggesting constipation. 3. Blunting of the left costophrenic angle, suspected to likely reflect chronic pleural reaction/scarring. No other active cardiopulmonary disease identified. Electronically Signed   By: Jeannine Boga M.D.   On: 09/16/2016 05:56    Review of Systems  Constitutional: Negative for chills, fever and weight loss.  HENT: Negative  for ear discharge, hearing loss and tinnitus.   Eyes: Negative for blurred vision, photophobia and pain.  Respiratory: Negative for cough, hemoptysis, sputum production and shortness of breath.   Cardiovascular: Negative for chest pain, palpitations and leg swelling.  Gastrointestinal: Positive for abdominal pain and nausea. Negative for constipation, diarrhea, heartburn and vomiting.  Genitourinary: Negative for dysuria, frequency and urgency.  Musculoskeletal: Negative for back pain, joint pain and myalgias.   Blood pressure 131/68, pulse 80, temperature 99.4 F (37.4 C), temperature source Oral, resp. rate 18, height 5' 4"  (1.626 m), weight 77.1 kg (170 lb), SpO2 94 %. Physical Exam  Constitutional: She is oriented to person, place, and time. She appears well-developed and well-nourished. No distress.  HENT:  Head: Normocephalic and atraumatic.  Right Ear: External ear normal.  Left Ear: External ear normal.  Eyes: Conjunctivae and EOM are normal. Pupils are equal, round, and reactive to light. Right eye exhibits no discharge. Left eye exhibits no discharge. No scleral icterus.  Neck: Normal range of motion. Neck supple. No JVD present. No tracheal deviation present. No thyromegaly present.  Cardiovascular: Normal rate, regular rhythm and normal heart sounds.  Exam reveals no gallop and no friction rub.   No murmur heard. Respiratory: Breath sounds normal. No stridor. No respiratory distress. She has no wheezes. She has no rales. She exhibits no tenderness.  GI: Soft. Bowel sounds are normal. She exhibits no distension and no mass. There is tenderness (RUQ). There is no rebound and no guarding.  Musculoskeletal: Normal range of motion. She exhibits no edema, tenderness or deformity.  Neurological: She is alert and oriented to person, place, and time. No cranial nerve deficit.  Skin: Skin is warm and dry. No rash noted. She is not diaphoretic. No erythema. No pallor.     Assessment/Plan: 82 year old female with previous cardiac history as stated above. Patient is to be acute cholecystitis.  1. We will have the patient cleared by cardiology prior to scheduling surgery. As long as the patient is cleared by cardiology we will proceed to the operating room tomorrow for a laparoscopic cholecystectomy. 2. Continue antibiotics 3. Nothing by mouth  Rosario Jacks., Bradan Congrove 09/16/2016, 5:13 PM

## 2016-09-16 NOTE — Progress Notes (Signed)
Pharmacy Antibiotic Note  Tracy Buck is a 82 y.o. female admitted on 09/16/2016 with cholecysititis.  Pharmacy has been consulted for Zosyn dosing.  Plan: Zosyn 3.375g IV q8h (4 hour infusion).  Height: '5\' 4"'$  (162.6 cm) Weight: 170 lb (77.1 kg) IBW/kg (Calculated) : 54.7  Temp (24hrs), Avg:98.6 F (37 C), Min:97.7 F (36.5 C), Max:99.4 F (37.4 C)   Recent Labs Lab 09/16/16 0440 09/16/16 0451  WBC 14.2*  --   CREATININE 0.84  --   LATICACIDVEN  --  1.08    Estimated Creatinine Clearance: 54.6 mL/min (by C-G formula based on SCr of 0.84 mg/dL).    Allergies  Allergen Reactions  . Fig Extract [Ficus] Anaphylaxis  . Sulfa Antibiotics Other (See Comments)    Severe nervousness.    Antimicrobials this admission: Zosyn 1/25 >>  Levaquin 1/25 x 1 Flagyl 1/25 x 1  Dose adjustments this admission:   Microbiology results: ** BCx: * ** UCx: * ** Sputum: *  ** MRSA PCR: *  Thank you for allowing pharmacy to be a part of this patient's care.  Uvaldo Rising, BCPS  Clinical Pharmacist Pager [phone]  09/16/2016 5:13 PM

## 2016-09-16 NOTE — Progress Notes (Signed)
   Coming from [facility] for apparent cholecystitis. Ultrasound positive with Murphy sign and gallbladder wall thickening with gallstones. Patient with an elevated white blood cell count 14. Patient originally requesting go to [facility] but there are no beds available and patient care has been transferred to [facility]. Patient except to [facility] confirmed that they will review the case with on-call surgical group as patient likely to need cholecystectomy. Patient appears to be afebrile and vital signs are stable.   Linna Darner, MD [facility] 09/16/2016, 10:24 AM

## 2016-09-16 NOTE — ED Provider Notes (Signed)
9:32 AM ultrasound shows gallstones and thickening of gallbladder wall c/w choleycystitis- elevated WBC is c/w this as well.  Pt's primary doctor is at [facility]- we have called [facility] and there are no beds available at this time.  Pt and family are agreeable to transfer to [facility] or [facility] if there are no [facility] beds.    10:22 AM d/w Dr. Saralyn Pilar, [facility] who accepts patient for admission [facility].  Inpatient med surg bed. Will notify surgery about patient as well.    10:54 AM d/w Dr. Rosendo Gros, surgery at [facility]- he is aware of the patient and will see in consultation.     Alfonzo Beers, MD 09/16/16 1055

## 2016-09-16 NOTE — H&P (Addendum)
History and Physical    Tracy Buck WCB:[id number] DOB: September 30, 1934 DOA: 09/16/2016  PCP: Drake Leach, MD Patient [facility]: [facility] [place]  Chief Complaint: Abd pain  HPI: Tracy Buck is a 82 y.o. female with medical history significant of  lung cancer, COPD, hypertension, hyperlipidemia, aortic valve replacement with bovine valve. Patient presenting with approximately 2 day history of initially intermittent right upper quadrant abdominal pain. Associated with nausea and vomiting. Pain radiates to the right upper back. Overnight pain became constant. Getting worse. Worse with movement and with meals. Fever reported by EMS during transport from [facility] [place] [facility] 100.4. Pain relieved with IV narcotics. Patient's last oral intake was at approximately 1900 on 09/15/2016.  ED Course: Objective findings outlined below. CCS consult. Multiple rounds of IV narcotics given with relief of pain. Flagyl and Levaquin.  Review of Systems: As per HPI otherwise 10 point review of systems negative.   Ambulatory Status: no restrictions  Past Medical History:  Diagnosis Date  . Arthritis    "fingers, knees" (09/16/2016)  . Complication of anesthesia    "I often go into severe panic attacks when they try to bring me out and they have to put me back under; sometimes happens when they are starting the anesthesia also" (09/16/2016)  . COPD (chronic obstructive pulmonary disease) ([place])    "I think this has been recently dx'd" (09/16/2016)  . Heart valve replaced   . Hyperlipidemia   . Hypertension   . Lung cancer ([place]) 2013  . Lung mass   . Myocardial infarct   . Panic attack   . Pneumonia    "when I was very young" (09/16/2016)  . Rib fracture   . Skin cancer of nose     Past Surgical History:  Procedure Laterality Date  . AORTIC VALVE REPLACEMENT     "bovine valve"  . BYPASS GRAFT     does not know if she had CABG (09/16/2016)  . CARDIAC SURGERY     does  not know if she had CABG (09/16/2016)  . CARDIAC VALVE REPLACEMENT    . CARPAL TUNNEL RELEASE Right 1959  . DILATION AND CURETTAGE OF UTERUS  1963  . KNEE ARTHROSCOPY Left 09/2014  . LOBECTOMY Left 2013   "took bottom lobe off"  . SKIN CANCER EXCISION     "tip of my nose"  . TONSILLECTOMY      Social History   Social History  . Marital status: Widowed    Spouse name: N/A  . Number of children: N/A  . Years of education: N/A   Occupational History  . Not on file.   Social History Main Topics  . Smoking status: Never Smoker  . Smokeless tobacco: Never Used  . Alcohol use Yes     Comment: 09/16/2016 "2-3 glasses of wine when at the beach; once/year"  . Drug use: No  . Sexual activity: No   Other Topics Concern  . Not on file   Social History Narrative  . No narrative on file    Allergies  Allergen Reactions  . Fig Extract [Ficus] Anaphylaxis  . Sulfa Antibiotics Other (See Comments)    Severe nervousness.    Family History  Problem Relation Age of Onset  . Transient ischemic attack Mother   . Colon cancer Father     Prior to Admission medications   Medication Sig Start Date End Date Taking? Authorizing Provider  acetaminophen (TYLENOL) 500 MG tablet Take 500 mg  by mouth every 6 (six) hours as needed. For pain.    Historical Provider, MD  albuterol (PROVENTIL HFA;VENTOLIN HFA) 108 (90 BASE) MCG/ACT inhaler Inhale 2 puffs into the lungs every 6 (six) hours as needed. Patient uses 2 puffs of this medication at 6 am.    Historical Provider, MD  albuterol (PROVENTIL) (2.5 MG/3ML) 0.083% nebulizer solution Take 2.5 mg by nebulization every 6 (six) hours as needed.    Historical Provider, MD  aspirin 81 MG chewable tablet Chew 81 mg by mouth daily.    Historical Provider, MD  benzonatate (TESSALON) 200 MG capsule Take 200 mg by mouth 3 (three) times daily as needed. For cough.    Historical Provider, MD  calcium carbonate (TITRALAC) 420 MG CHEW Chew 420 mg by mouth daily  as needed.    Historical Provider, MD  docusate sodium (COLACE) 100 MG capsule Take 100 mg by mouth 2 (two) times daily.    Historical Provider, MD  famotidine (PEPCID) 20 MG tablet Take 1 tablet (20 mg total) by mouth 2 (two) times daily. 04/28/12 04/28/13  John-Adam Bonk, MD  Fluticasone-Salmeterol (ADVAIR) 250-50 MCG/DOSE AEPB Inhale 1 puff into the lungs every 12 (twelve) hours.    Historical Provider, MD  lisinopril-hydrochlorothiazide (PRINZIDE,ZESTORETIC) 20-12.5 MG per tablet Take 1 tablet by mouth daily.    Historical Provider, MD  ondansetron (ZOFRAN) 4 MG tablet Take 4 mg by mouth every 8 (eight) hours as needed.    Historical Provider, MD  oxyCODONE (OXYCONTIN) 10 MG 12 hr tablet Take 10 mg by mouth every 12 (twelve) hours.    Historical Provider, MD  rosuvastatin (CRESTOR) 20 MG tablet Take 20 mg by mouth daily.    Historical Provider, MD  tiotropium (SPIRIVA HANDIHALER) 18 MCG inhalation capsule Place 18 mcg into inhaler and inhale daily.    Historical Provider, MD  zolpidem (AMBIEN) 5 MG tablet Take 5 mg by mouth at bedtime as needed. Patient is using 7.5 milligrams of this medication.    Historical Provider, MD    Physical Exam: Vitals:   09/16/16 1400 09/16/16 1414 09/16/16 1415 09/16/16 1548  BP: 130/74 130/74  131/68  Pulse: 84 83 86 80  Resp:  20  18  Temp:  98.8 F (37.1 C)  99.4 F (37.4 C)  TempSrc:  Oral  Oral  SpO2: 96% 96% 94%   Weight:      Height:         General:  Appears calm and comfortable Eyes:  PERRL, EOMI, normal lids, iris ENT:  grossly normal hearing, lips & tongue, mmm Neck:  no LAD, masses or thyromegaly Cardiovascular:  Regular rate and rhythm, 4/6 systolic murmur, 4-4+ lower extremity pitting edema.  Respiratory:  CTA bilaterally, no w/r/r. Normal respiratory effort. Abdomen:  Hypoactive bowel sounds, positive Murphy sign, nondistended` Skin:  no rash or induration seen on limited exam Musculoskeletal:  grossly normal tone BUE/BLE, good ROM, no  bony abnormality Psychiatric:  grossly normal mood and affect, speech fluent and appropriate, AOx3 Neurologic:  CN 2-12 grossly intact, moves all extremities in coordinated fashion, sensation intact  Labs on Admission: I have personally reviewed following labs and imaging studies  CBC:  Recent Labs Lab 09/16/16 0440  WBC 14.2*  NEUTROABS 11.6*  HGB 13.9  HCT 40.5  MCV 88.8  PLT 010   Basic Metabolic Panel:  Recent Labs Lab 09/16/16 0440  NA 132*  K 3.6  CL 101  CO2 22  GLUCOSE 157*  BUN 16  CREATININE  0.84  CALCIUM 9.0   GFR: Estimated Creatinine Clearance: 54.6 mL/min (by C-G formula based on SCr of 0.84 mg/dL). Liver Function Tests:  Recent Labs Lab 09/16/16 0440  AST 29  ALT 24  ALKPHOS 76  BILITOT 1.0  PROT 6.9  ALBUMIN 4.1    Recent Labs Lab 09/16/16 0440  LIPASE 20   No results for input(s): AMMONIA in the last 168 hours. Coagulation Profile: No results for input(s): INR, PROTIME in the last 168 hours. Cardiac Enzymes: No results for input(s): CKTOTAL, CKMB, CKMBINDEX, TROPONINI in the last 168 hours. BNP (last 3 results) No results for input(s): PROBNP in the last 8760 hours. HbA1C: No results for input(s): HGBA1C in the last 72 hours. CBG:  Recent Labs Lab 09/16/16 1710  GLUCAP 142*   Lipid Profile: No results for input(s): CHOL, HDL, LDLCALC, TRIG, CHOLHDL, LDLDIRECT in the last 72 hours. Thyroid Function Tests: No results for input(s): TSH, T4TOTAL, FREET4, T3FREE, THYROIDAB in the last 72 hours. Anemia Panel: No results for input(s): VITAMINB12, FOLATE, FERRITIN, TIBC, IRON, RETICCTPCT in the last 72 hours. Urine analysis: No results found for: COLORURINE, APPEARANCEUR, LABSPEC, PHURINE, GLUCOSEU, HGBUR, BILIRUBINUR, KETONESUR, PROTEINUR, UROBILINOGEN, NITRITE, LEUKOCYTESUR  Creatinine Clearance: Estimated Creatinine Clearance: 54.6 mL/min (by C-G formula based on SCr of 0.84 mg/dL).  Sepsis  Labs: '@LABRCNTIP'$ (procalcitonin:4,lacticidven:4) )No results found for this or any previous visit (from the past 240 hour(s)).   Radiological Exams on Admission: US Abdomen Complete  Result Date: 09/16/2016 CLINICAL DATA:  Zero severe right upper quadrant pain associated with nausea for the past 2 days. History of gallstones, hyperlipidemia, lung malignancy. EXAM: ABDOMEN ULTRASOUND COMPLETE COMPARISON:  Abdominal and pelvic CT scan of April 28, 2012. FINDINGS: The patient was unable to cooperate with deep breathing maneuvers which limits the study. Gallbladder: The gallbladder is adequately distended. There are at least 2 echogenic mobile shadowing stones measuring up to 1.6 cm in diameter. Sludge is present as well. The gallbladder wall is top normal at 3 mm. There is a positive sonographic Murphy's sign reported by the technologist. Common bile duct: Diameter: 6 mm where visualized. Liver: The hepatic echotexture is normal. There is no discrete mass or ductal dilation. IVC: Bowel gas limits evaluation of the IVC. Bowel gas limits evaluation of the pancreas. Pancreas: Visualized portion unremarkable. Spleen: Size and appearance within normal limits. Right Kidney: Length: 10.4 cm. In the upper pole of the right kidney there is a cystic structure with a few internal echoes measuring 4.4 cm in diameter. A cyst was previously demonstrated on the 2013 CT scan. Left Kidney: Length: 11 cm. The echotexture is normal. There is a prominent column of Bertin. Abdominal aorta: Normal where visualized. Other findings: There is no ascites. IMPRESSION: Gallstones. Positive sonographic Murphy's sign and top-normal gallbladder wall thickness may reflect acute cholecystitis. Non-simple cystic structure in the upper pole of the right kidney. This has increased slightly in size since the CT scan of September 2013. Electronically Signed   By: Zamari Bonsall  [place] M.D.   On: 09/16/2016 08:52   Dg Chest Port 1 View  Result Date:  09/16/2016 CLINICAL DATA:  Preoperative examination. History of hypertension, aortic valve replacement, previous MI, partial left lower lobectomy. EXAM: PORTABLE CHEST 1 VIEW COMPARISON:  Chest x-ray of September 16, 2016 FINDINGS: The lungs are adequately inflated. The cardiac apex is obscured on the current exam but was normal on the earlier study. The heart is normal in size. There is a prosthetic aortic valve. The sternal wires are  intact. There is calcification in the wall of the aortic arch. The pulmonary vascularity is not clearly engorged. IMPRESSION: New obscuration of the left heart border since this morning's acute abdominal series may reflect overlap of normal structures but acute lingular atelectasis is not excluded. Correlation with the patient's clinical and laboratory values is needed. No evidence of pulmonary edema. Thoracic aortic atherosclerosis. Electronically Signed   By: Arley Garant  [place] M.D.   On: 09/16/2016 17:12   Dg Abd Acute W/chest  Result Date: 09/16/2016 CLINICAL DATA:  Initial evaluation for periodic right upper and left upper quadrant pain, right flank pain, and right lower quadrant pain. EXAM: DG ABDOMEN ACUTE W/ 1V CHEST COMPARISON:  Prior radiograph from 04/28/2012. FINDINGS: Median sternotomy wires underlying surgical clips and prostatic valve noted. Mild cardiomegaly is stable. Mediastinal silhouette within normal limits. Aortic atherosclerosis noted. Lungs normally inflated. Chronic coarsening of the interstitial markings. Blunting of left costophrenic angle felt to most likely reflect chronic pleural reaction/ scarring. No definite pleural effusion. No pulmonary edema. No focal infiltrates. No pneumothorax. Bowel gas pattern within normal limits without evidence for obstruction or ileus. No free air. No abnormal bowel wall thickening. Moderate amount of retained stool throughout the colon. No soft tissue mass or abnormal calcifications. Multilevel degenerate spondylolysis noted  within the visualized spine. Osteoarthritic changes present about the hips. Osteopenia. No acute osseous abnormality. IMPRESSION: 1. Nonobstructive bowel gas pattern with no radiographic evidence for acute intra-abdominal process. 2. Moderate amount of retained stool throughout the colon, suggesting constipation. 3. Blunting of the left costophrenic angle, suspected to likely reflect chronic pleural reaction/scarring. No other active cardiopulmonary disease identified. Electronically Signed   By: Jeannine Boga M.D.   On: 09/16/2016 05:56    MMI:[id number].  Assessment/Plan Active Problems:   Hyperlipidemia   Cholecystitis   S/P AVR   S/P CABG (coronary artery bypass graft)   Essential hypertension   COPD (chronic obstructive pulmonary disease) ([facility])   Hyperglycemia    Acute cholecystitis: confirmed on Abd [place]. WBC 14. Fever to 100.4 by EMS. Surgery following and planning cholecystectomy on 09/17/16 (Dr. Rosendo Gros) - Zosyn - Pre-op labs/CXR - IVF - NPO after midnight - Fentanyl - further mgt per surgery  S/p AVR/CABG: IV/VI systolic murmur w/ 2+ LE edema. - Cards consulted for pre-op clearance - EKG, Trop - Cont asa, lipitor when taking PO - mild cardiomegaly on CXR. Will obtain Echo and BNP  HTN: - resume lisinopril/HCTZ when taking PO - Hydralazine IV prn  Hyperglycemia: likely stress related - CBG monitoring - A1c  COPD: - Continue Spiriva, Dulera   DVT prophylaxis: SCD  Code Status: FULL  Family Communication: daughter  Disposition Plan: pending cholecystectomy  Consults called: CCS  Admission status: Inpt    Heather Mckendree J MD [facility]  If 7PM-7AM, please contact night-coverage [email or web address] Password [email or web address]  09/16/2016, 5:17 PM

## 2016-09-17 ENCOUNTER — Inpatient Hospital Stay (HOSPITAL_COMMUNITY): Payer: Medicare Other | Admitting: Certified Registered"

## 2016-09-17 ENCOUNTER — Inpatient Hospital Stay (HOSPITAL_COMMUNITY): Payer: Medicare Other

## 2016-09-17 ENCOUNTER — Encounter (HOSPITAL_COMMUNITY): Payer: Self-pay | Admitting: *Deleted

## 2016-09-17 ENCOUNTER — Encounter (HOSPITAL_COMMUNITY)
Admission: EM | Disposition: A | Discharge status: Home / Self Care | Payer: Self-pay | Source: Home / Self Care | Attending: Internal Medicine

## 2016-09-17 DIAGNOSIS — E784 Other hyperlipidemia: Secondary | ICD-10-CM

## 2016-09-17 DIAGNOSIS — J439 Emphysema, unspecified: Secondary | ICD-10-CM

## 2016-09-17 DIAGNOSIS — K819 Cholecystitis, unspecified: Secondary | ICD-10-CM

## 2016-09-17 DIAGNOSIS — R011 Cardiac murmur, unspecified: Secondary | ICD-10-CM

## 2016-09-17 DIAGNOSIS — I10 Essential (primary) hypertension: Secondary | ICD-10-CM

## 2016-09-17 HISTORY — PX: CHOLECYSTECTOMY: SHX55

## 2016-09-17 LAB — BASIC METABOLIC PANEL
ANION GAP: 5 (ref 5–15)
BUN: 12 mg/dL (ref 6–20)
CALCIUM: 8.3 mg/dL — AB (ref 8.9–10.3)
CO2: 24 mmol/L (ref 22–32)
Chloride: 104 mmol/L (ref 101–111)
Creatinine, Ser: 1.05 mg/dL — ABNORMAL HIGH (ref 0.44–1.00)
GFR calc non Af Amer: 49 mL/min — ABNORMAL LOW (ref >60)
GFR, EST AFRICAN AMERICAN: 57 mL/min — AB (ref >60)
Glucose, Bld: 141 mg/dL — ABNORMAL HIGH (ref 65–99)
POTASSIUM: 3.7 mmol/L (ref 3.5–5.1)
Sodium: 133 mmol/L — ABNORMAL LOW (ref 135–145)

## 2016-09-17 LAB — MRSA PCR SCREENING: MRSA BY PCR: NEGATIVE

## 2016-09-17 LAB — CBC
HEMATOCRIT: 37.6 % (ref 36.0–46.0)
HEMOGLOBIN: 12.7 g/dL (ref 12.0–15.0)
MCH: 30.1 pg (ref 26.0–34.0)
MCHC: 33.8 g/dL (ref 30.0–36.0)
MCV: 89.1 fL (ref 78.0–100.0)
Platelets: 166 10*3/uL (ref 150–400)
RBC: 4.22 MIL/uL (ref 3.87–5.11)
RDW: 13.4 % (ref 11.5–15.5)
WBC: 17.9 10*3/uL — AB (ref 4.0–10.5)

## 2016-09-17 LAB — GLUCOSE, CAPILLARY
GLUCOSE-CAPILLARY: 140 mg/dL — AB (ref 65–99)
GLUCOSE-CAPILLARY: 133 mg/dL — AB (ref 65–99)
Glucose-Capillary: 117 mg/dL — ABNORMAL HIGH (ref 65–99)
Glucose-Capillary: 191 mg/dL — ABNORMAL HIGH (ref 65–99)

## 2016-09-17 LAB — ECHOCARDIOGRAM COMPLETE
HEIGHTINCHES: 64 in
Weight: 2720 oz

## 2016-09-17 SURGERY — LAPAROSCOPIC CHOLECYSTECTOMY WITH INTRAOPERATIVE CHOLANGIOGRAM
Anesthesia: General | Site: Abdomen

## 2016-09-17 MED ORDER — LIDOCAINE 2% (20 MG/ML) 5 ML SYRINGE
INTRAMUSCULAR | Status: AC
  Filled 2016-09-17: qty 5

## 2016-09-17 MED ORDER — LIDOCAINE 2% (20 MG/ML) 5 ML SYRINGE
INTRAMUSCULAR | Status: AC
Start: 2016-09-17 — End: 2016-09-17
  Filled 2016-09-17: qty 5

## 2016-09-17 MED ORDER — OXYCODONE HCL 5 MG/5ML PO SOLN: 5.0000 mg | Freq: Once | ORAL | Status: DC | PRN

## 2016-09-17 MED ORDER — SUCCINYLCHOLINE CHLORIDE 200 MG/10ML IV SOSY
PREFILLED_SYRINGE | INTRAVENOUS | Status: AC
  Filled 2016-09-17: qty 10

## 2016-09-17 MED ORDER — ROCURONIUM BROMIDE 50 MG/5ML IV SOSY
PREFILLED_SYRINGE | INTRAVENOUS | Status: AC
  Filled 2016-09-17: qty 5

## 2016-09-17 MED ORDER — PHENYLEPHRINE 40 MCG/ML (10ML) SYRINGE FOR IV PUSH (FOR BLOOD PRESSURE SUPPORT)
PREFILLED_SYRINGE | INTRAVENOUS | Status: DC | PRN
  Administered 2016-09-17: 80 ug via INTRAVENOUS

## 2016-09-17 MED ORDER — LACTATED RINGERS IV SOLN
INTRAVENOUS | Status: DC | PRN
  Administered 2016-09-17 (×2): via INTRAVENOUS

## 2016-09-17 MED ORDER — ONDANSETRON HCL 4 MG/2ML IJ SOLN: 4.0000 mg | Freq: Four times a day (QID) | INTRAMUSCULAR | Status: DC | PRN

## 2016-09-17 MED ORDER — ESMOLOL HCL 100 MG/10ML IV SOLN
INTRAVENOUS | Status: DC | PRN
  Administered 2016-09-17: 10 mg via INTRAVENOUS
  Administered 2016-09-17 (×2): 20 mg via INTRAVENOUS

## 2016-09-17 MED ORDER — ESMOLOL HCL 100 MG/10ML IV SOLN
INTRAVENOUS | Status: AC
  Filled 2016-09-17: qty 10

## 2016-09-17 MED ORDER — FENTANYL CITRATE (PF) 100 MCG/2ML IJ SOLN
INTRAMUSCULAR | Status: AC
  Filled 2016-09-17: qty 2

## 2016-09-17 MED ORDER — IOPAMIDOL (ISOVUE-300) INJECTION 61%
INTRAVENOUS | Status: AC
  Filled 2016-09-17: qty 50

## 2016-09-17 MED ORDER — LIDOCAINE 2% (20 MG/ML) 5 ML SYRINGE
INTRAMUSCULAR | Status: DC | PRN
  Administered 2016-09-17: 60 mg via INTRAVENOUS

## 2016-09-17 MED ORDER — SUGAMMADEX SODIUM 200 MG/2ML IV SOLN
INTRAVENOUS | Status: AC
  Filled 2016-09-17: qty 2

## 2016-09-17 MED ORDER — OXYCODONE HCL 5 MG PO TABS: 5.0000 mg | ORAL_TABLET | Freq: Once | ORAL | Status: DC | PRN

## 2016-09-17 MED ORDER — PHENYLEPHRINE 40 MCG/ML (10ML) SYRINGE FOR IV PUSH (FOR BLOOD PRESSURE SUPPORT)
PREFILLED_SYRINGE | INTRAVENOUS | Status: AC
  Filled 2016-09-17: qty 10

## 2016-09-17 MED ORDER — 0.9 % SODIUM CHLORIDE (POUR BTL) OPTIME
TOPICAL | Status: DC | PRN
  Administered 2016-09-17: 1000 mL

## 2016-09-17 MED ORDER — HYDROMORPHONE HCL 2 MG/ML IJ SOLN
INTRAMUSCULAR | Status: AC
  Administered 2016-09-17: 0.5 mg
  Filled 2016-09-17: qty 1

## 2016-09-17 MED ORDER — FENTANYL CITRATE (PF) 100 MCG/2ML IJ SOLN
INTRAMUSCULAR | Status: DC | PRN
  Administered 2016-09-17: 100 ug via INTRAVENOUS
  Administered 2016-09-17: 50 ug via INTRAVENOUS
  Administered 2016-09-17: 100 ug via INTRAVENOUS
  Administered 2016-09-17: 50 ug via INTRAVENOUS

## 2016-09-17 MED ORDER — BUPIVACAINE HCL 0.25 % IJ SOLN
INTRAMUSCULAR | Status: DC | PRN
  Administered 2016-09-17: 8 mL

## 2016-09-17 MED ORDER — BUPIVACAINE HCL (PF) 0.25 % IJ SOLN
INTRAMUSCULAR | Status: AC
  Filled 2016-09-17: qty 30

## 2016-09-17 MED ORDER — SODIUM CHLORIDE 0.9 % IR SOLN
Status: DC | PRN
  Administered 2016-09-17 (×2): 1000 mL

## 2016-09-17 MED ORDER — ONDANSETRON HCL 4 MG/2ML IJ SOLN
INTRAMUSCULAR | Status: AC
  Filled 2016-09-17: qty 2

## 2016-09-17 MED ORDER — ONDANSETRON HCL 4 MG/2ML IJ SOLN
INTRAMUSCULAR | Status: DC | PRN
  Administered 2016-09-17: 4 mg via INTRAVENOUS

## 2016-09-17 MED ORDER — HYDROMORPHONE HCL 1 MG/ML IJ SOLN
0.2500 mg | INTRAMUSCULAR | Status: DC | PRN
Start: 2016-09-17 — End: 2016-09-17
  Administered 2016-09-17: 0.5 mg via INTRAVENOUS

## 2016-09-17 MED ORDER — ROCURONIUM BROMIDE 10 MG/ML (PF) SYRINGE
PREFILLED_SYRINGE | INTRAVENOUS | Status: DC | PRN
  Administered 2016-09-17: 40 mg via INTRAVENOUS
  Administered 2016-09-17: 10 mg via INTRAVENOUS

## 2016-09-17 MED ORDER — PROPOFOL 10 MG/ML IV BOLUS
INTRAVENOUS | Status: DC | PRN
  Administered 2016-09-17: 160 mg via INTRAVENOUS

## 2016-09-17 MED ORDER — PROPOFOL 10 MG/ML IV BOLUS
INTRAVENOUS | Status: AC
  Filled 2016-09-17: qty 20

## 2016-09-17 MED ORDER — SUGAMMADEX SODIUM 200 MG/2ML IV SOLN
INTRAVENOUS | Status: DC | PRN
  Administered 2016-09-17: 200 mg via INTRAVENOUS

## 2016-09-17 MED ORDER — HEMOSTATIC AGENTS (NO CHARGE) OPTIME
TOPICAL | Status: DC | PRN
  Administered 2016-09-17: 1 via TOPICAL

## 2016-09-17 MED ORDER — PERFLUTREN LIPID MICROSPHERE
1.0000 mL | INTRAVENOUS | Status: AC | PRN
  Administered 2016-09-17: 4 mL via INTRAVENOUS
  Filled 2016-09-17: qty 10

## 2016-09-17 MED ORDER — HYDROCODONE-ACETAMINOPHEN 5-325 MG PO TABS
1.0000 | ORAL_TABLET | ORAL | Status: DC | PRN
  Administered 2016-09-17 – 2016-09-19 (×8): 2 via ORAL
  Filled 2016-09-17 (×8): qty 2

## 2016-09-17 SURGICAL SUPPLY — 48 items
APPLIER CLIP 5 13 M/L LIGAMAX5 (MISCELLANEOUS)
BENZOIN TINCTURE PRP APPL 2/3 (GAUZE/BANDAGES/DRESSINGS) ×3 IMPLANT
CANISTER SUCTION 2500CC (MISCELLANEOUS) ×3 IMPLANT
CHLORAPREP W/TINT 26ML (MISCELLANEOUS) ×3 IMPLANT
CLIP APPLIE 5 13 M/L LIGAMAX5 (MISCELLANEOUS) IMPLANT
CLIP LIGATING HEMO O LOK GREEN (MISCELLANEOUS) ×3 IMPLANT
CLOSURE WOUND 1/2 X4 (GAUZE/BANDAGES/DRESSINGS) ×1
COVER MAYO STAND STRL (DRAPES) ×3 IMPLANT
COVER SURGICAL LIGHT HANDLE (MISCELLANEOUS) ×3 IMPLANT
COVER TRANSDUCER ULTRASND (DRAPES) IMPLANT
DEVICE TROCAR PUNCTURE CLOSURE (ENDOMECHANICALS) ×3 IMPLANT
DRAPE C-ARM 42X72 X-RAY (DRAPES) ×3 IMPLANT
ELECT REM PT RETURN 9FT ADLT (ELECTROSURGICAL) ×3
ELECTRODE REM PT RTRN 9FT ADLT (ELECTROSURGICAL) ×1 IMPLANT
ENDOLOOP SUT PDS II  0 18 (SUTURE) ×2
ENDOLOOP SUT PDS II 0 18 (SUTURE) ×1 IMPLANT
GAUZE SPONGE 2X2 8PLY STRL LF (GAUZE/BANDAGES/DRESSINGS) ×1 IMPLANT
GLOVE BIO SURGEON STRL SZ7.5 (GLOVE) ×3 IMPLANT
GOWN STRL REUS W/ TWL LRG LVL3 (GOWN DISPOSABLE) ×2 IMPLANT
GOWN STRL REUS W/ TWL XL LVL3 (GOWN DISPOSABLE) ×1 IMPLANT
GOWN STRL REUS W/TWL LRG LVL3 (GOWN DISPOSABLE) ×4
GOWN STRL REUS W/TWL XL LVL3 (GOWN DISPOSABLE) ×2
HEMOSTAT SNOW SURGICEL 2X4 (HEMOSTASIS) ×3 IMPLANT
IV CATH 14GX2 1/4 (CATHETERS) ×3 IMPLANT
KIT BASIN OR (CUSTOM PROCEDURE TRAY) ×3 IMPLANT
KIT ROOM TURNOVER OR (KITS) ×3 IMPLANT
NEEDLE INSUFFLATION 14GA 120MM (NEEDLE) ×3 IMPLANT
NS IRRIG 1000ML POUR BTL (IV SOLUTION) ×3 IMPLANT
PAD ARMBOARD 7.5X6 YLW CONV (MISCELLANEOUS) ×6 IMPLANT
POUCH RETRIEVAL ECOSAC 10 (ENDOMECHANICALS) ×1 IMPLANT
POUCH RETRIEVAL ECOSAC 10MM (ENDOMECHANICALS) ×2
POUCH SPECIMEN RETRIEVAL 10MM (ENDOMECHANICALS) IMPLANT
SCISSORS LAP 5X35 DISP (ENDOMECHANICALS) ×3 IMPLANT
SET CHOLANGIOGRAPHY FRANKLIN (SET/KITS/TRAYS/PACK) ×3 IMPLANT
SET IRRIG TUBING LAPAROSCOPIC (IRRIGATION / IRRIGATOR) ×6 IMPLANT
SLEEVE ENDOPATH XCEL 5M (ENDOMECHANICALS) ×6 IMPLANT
SPECIMEN JAR SMALL (MISCELLANEOUS) ×3 IMPLANT
SPONGE GAUZE 2X2 STER 10/PKG (GAUZE/BANDAGES/DRESSINGS) ×2
STRIP CLOSURE SKIN 1/2X4 (GAUZE/BANDAGES/DRESSINGS) ×2 IMPLANT
SUT MNCRL AB 3-0 PS2 18 (SUTURE) ×3 IMPLANT
SUT MNCRL AB 4-0 PS2 18 (SUTURE) ×3 IMPLANT
SUT VICRYL 0 UR6 27IN ABS (SUTURE) ×3 IMPLANT
TOWEL OR 17X24 6PK STRL BLUE (TOWEL DISPOSABLE) ×3 IMPLANT
TOWEL OR 17X26 10 PK STRL BLUE (TOWEL DISPOSABLE) ×3 IMPLANT
TRAY LAPAROSCOPIC MC (CUSTOM PROCEDURE TRAY) ×3 IMPLANT
TROCAR XCEL NON-BLD 11X100MML (ENDOMECHANICALS) ×3 IMPLANT
TROCAR XCEL NON-BLD 5MMX100MML (ENDOMECHANICALS) ×3 IMPLANT
TUBING INSUFFLATION (TUBING) ×3 IMPLANT

## 2016-09-17 NOTE — Anesthesia Procedure Notes (Signed)
Procedure Name: Intubation Date/Time: 09/17/2016 1:07 PM Performed by: Myna Bright Pre-anesthesia Checklist: Patient identified, Emergency Drugs available, Suction available and Patient being monitored Patient Re-evaluated:Patient Re-evaluated prior to inductionOxygen Delivery Method: Circle system utilized Preoxygenation: Pre-oxygenation with 100% oxygen Intubation Type: IV induction Ventilation: Mask ventilation without difficulty Laryngoscope Size: Mac and 3 Grade View: Grade I Tube type: Oral Tube size: 7.0 mm Number of attempts: 1 Airway Equipment and Method: Stylet Placement Confirmation: ETT inserted through vocal cords under direct vision,  positive ETCO2 and breath sounds checked- equal and bilateral Secured at: 21 cm Tube secured with: Tape Dental Injury: Teeth and Oropharynx as per pre-operative assessment

## 2016-09-17 NOTE — Progress Notes (Signed)
Patient arrived from PACU back to 6N rm22 alert and oriented in moderate pain but in and out of sleep. Iv fluids running, telemetry applied, on 2L o2, family at bedside, will continue to monitor.

## 2016-09-17 NOTE — Anesthesia Preprocedure Evaluation (Signed)
Anesthesia Evaluation  Patient identified by MRN, date of birth, ID band Patient awake    Reviewed: Allergy & Precautions, H&P , NPO status , Patient's Chart, lab work & pertinent test results  Airway Mallampati: II   Neck ROM: full    Dental   Pulmonary COPD,    breath sounds clear to auscultation       Cardiovascular hypertension, + Valvular Problems/Murmurs  Rhythm:regular Rate:Normal  S/p AVR (bioprosthetic)  TTE (09/17/16): normal LV function.     Neuro/Psych PSYCHIATRIC DISORDERS Anxiety    GI/Hepatic   Endo/Other    Renal/GU      Musculoskeletal  (+) Arthritis ,   Abdominal   Peds  Hematology   Anesthesia Other Findings   Reproductive/Obstetrics                             Anesthesia Physical Anesthesia Plan  ASA: III  Anesthesia Plan: General   Post-op Pain Management:    Induction: Intravenous  Airway Management Planned: Oral ETT  Additional Equipment:   Intra-op Plan:   Post-operative Plan: Extubation in OR  Informed Consent: I have reviewed the patients History and Physical, chart, labs and discussed the procedure including the risks, benefits and alternatives for the proposed anesthesia with the patient or authorized representative who has indicated his/her understanding and acceptance.     Plan Discussed with: CRNA, Anesthesiologist and Surgeon  Anesthesia Plan Comments:         Anesthesia Quick Evaluation

## 2016-09-17 NOTE — Progress Notes (Signed)
PROGRESS NOTE        PATIENT DETAILS Name: Tracy Buck Age: 82 y.o. Sex: female Date of Birth: [birth year] Admit Date: 09/16/2016 Admitting Physician Waldemar Dickens, MD GYJ:EHUDJSH, Meda Coffee, MD  Brief Narrative: Patient is a 82 y.o. Female with h/o lung cancer, COPD, HTN, HLD, and aortic valve replacement with bovine valve. She presented to the [facility] ED on 09/16/16 with hx X2 days of RUQ abdominal pain. Overnight pain became constant and worsened with movement and meals. During EMS transport, fever spiked to 100.4. Abdominal U/S found cholelithiasis with thickening of gallbladder wall consistent with cholecystitis. No beds were available at [facility], admitted to [facility] for further management and GI surgical consult.   Subjective: Patient is a 82 y.o. Female who appears in no acute distress with pain controlled this morning.   Assessment/Plan: Acute cholecystitis with cholelithiasis: Continues to have RUQ pain this morning, remains on empiric Zosyn. Dr Sloan Leiter spoke with Dr Terrence Dupont this morning-Cardiologist on call to assess for pre-operative evaluation. Tentative plan is cholecystectomy pending cardiology clearance. Waiting for today' echocardiogram results. Continue NPO and pain management. RUQ Ultrasound on 1/25 suggestive of cholecystitis.  Acute Hyperglycemia: Due to acute pancreatitis. Glucose 141 today. No previous dx of DM found in chart, no DM home medications listed, no HgbA1C on file. Continue to monitor glucose with NPO status. Suspect will resolve with pancreatitis improvement.    COPD (chronic obstructive pulmonary disease): No exacerbations so far during this admission, O2 sat is 95% Ra. Continue daily tiotropium, and mometasone-formoterol inhalers. Use albuerol nebulizer prn.      Essential Hypertension: BP today is 114/52. Remain NPO, continue lisinopril-HCTZ post pending surgery.   Hyperlipidemia: Remain NPO, continue rosuvastatin post pending  surgery.    Hx of Right Renal Cyst: Originally found on 2013 CT scan. Present on abdominal u/s 09/16/16 as well.   S/P AVR: Bovine valve replacement, patient does not remember date. Systolic murmur audible with auscultation.Today's echocardiogram results pending. Awaiting cardiology clearance prior to cholecystectomy. On Zosyn empirically  S/P CABG (coronary artery bypass graft): Patient cannot recall the date of this procedure. ED 09/16/16 state, "does not know if she had CABG"  Awaiting today's echocardiogram results and cardiac clearance for cholecystectomy.     S/P Lobectomy: Left lower lobe 2013.    DVT Prophylaxis: Compression boot therapy  Code Status: Full code  Family Communication: None  Disposition Plan: Remain inpatient  Antimicrobial agents: Anti-infectives    Start     Dose/Rate Route Frequency Ordered Stop   09/16/16 1715  piperacillin-tazobactam (ZOSYN) IVPB 3.375 g     3.375 g 12.5 mL/hr over 240 Minutes Intravenous Every 8 hours 09/16/16 1710     09/16/16 0945  levofloxacin (LEVAQUIN) IVPB 500 mg     500 mg 100 mL/hr over 60 Minutes Intravenous  Once 09/16/16 0939 09/16/16 1415   09/16/16 0945  metroNIDAZOLE (FLAGYL) IVPB 500 mg     500 mg 100 mL/hr over 60 Minutes Intravenous  Once 09/16/16 7026 09/16/16 1148      Procedures: Echocardiogram   CONSULTS: Cardiology-spoke with Dr Terrence Dupont Gen Surgery  Time spent: 25 minutes-Greater than 50% of this time was spent in counseling, explanation of diagnosis, planning of further management, and coordination of care.  MEDICATIONS: Scheduled Meds: . mometasone-formoterol  2 puff Inhalation BID  . piperacillin-tazobactam (ZOSYN)  IV  3.375 g  Intravenous Q8H  . sodium chloride flush  3 mL Intravenous Q12H  . tiotropium  18 mcg Inhalation Daily   Continuous Infusions: . sodium chloride 50 mL/hr at 09/17/16 1103   PRN Meds:.albuterol, fentaNYL (SUBLIMAZE) injection, perflutren lipid microspheres (DEFINITY) IV  suspension   PHYSICAL EXAM: Vital signs: Vitals:   09/16/16 1415 09/16/16 1548 09/16/16 2100 09/17/16 0436  BP:  131/68 (!) 91/54 (!) 114/52  Pulse: 86 80 81 82  Resp:  18  18  Temp:  99.4 F (37.4 C) 99.5 F (37.5 C) 98.8 F (37.1 C)  TempSrc:  Oral Oral Oral  SpO2: 94%  95% 95%  Weight:      Height:       Filed Weights   09/16/16 0434  Weight: 77.1 kg (170 lb)   Body mass index is 29.18 kg/m.   General appearance: Awake, alert, not in any distress. Speech Clear. Not toxic Looking Eyes: PERRLA, no scleral icterus. Pink conjunctiva HEENT: Atraumatic and Normocephalic Neck: Supple, no JVD. No cervical lymphadenopathy. No thyromegaly Resp:Good air entry bilaterally, CTAB.  CVS: S1 S2 regular, systolic crescendo murmur heard best in aortic region.  GI: Bowel sounds present. Abdomen is distended, and tender. (+/-) Organomegaly not able to be assessed due to pain.  Extremities: B/L Lower Ext shows no edema, both legs are warm to touch Neurology:  Speech clear, Non focal, sensation is grossly intact. Psychiatric: Normal judgment and insight. AAO x 3. Normal mood. Musculoskeletal:No digital cyanosis Skin:No Rash, warm and dry Wounds:N/A  I have personally reviewed following labs and imaging studies  LABORATORY DATA: CBC:  Recent Labs Lab 09/16/16 0440 09/16/16 1655 09/17/16 0521  WBC 14.2* 15.4* 17.9*  NEUTROABS 11.6*  --   --   HGB 13.9 13.8 12.7  HCT 40.5 40.4 37.6  MCV 88.8 88.6 89.1  PLT 178 175 888    Basic Metabolic Panel:  Recent Labs Lab 09/16/16 0440 09/16/16 1655 09/17/16 0521  NA 132* 133* 133*  K 3.6 3.8 3.7  CL 101 102 104  CO2 22 21* 24  GLUCOSE 157* 145* 141*  BUN '16 11 12  '$ CREATININE 0.84 0.79 1.05*  CALCIUM 9.0 8.5* 8.3*    GFR: Estimated Creatinine Clearance: 43.7 mL/min (by C-G formula based on SCr of 1.05 mg/dL (H)).  Liver Function Tests:  Recent Labs Lab 09/16/16 0440  AST 29  ALT 24  ALKPHOS 76  BILITOT 1.0  PROT  6.9  ALBUMIN 4.1    Recent Labs Lab 09/16/16 0440  LIPASE 20   No results for input(s): AMMONIA in the last 168 hours.  Coagulation Profile:  Recent Labs Lab 09/16/16 1655  INR 1.07    Cardiac Enzymes:  Recent Labs Lab 09/16/16 1655  TROPONINI 0.05*    BNP (last 3 results) No results for input(s): PROBNP in the last 8760 hours.  HbA1C: No results for input(s): HGBA1C in the last 72 hours.  CBG:  Recent Labs Lab 09/16/16 1710 09/16/16 2112 09/17/16 0754  GLUCAP 142* 169* 140*    Lipid Profile: No results for input(s): CHOL, HDL, LDLCALC, TRIG, CHOLHDL, LDLDIRECT in the last 72 hours.  Thyroid Function Tests: No results for input(s): TSH, T4TOTAL, FREET4, T3FREE, THYROIDAB in the last 72 hours.  Anemia Panel: No results for input(s): VITAMINB12, FOLATE, FERRITIN, TIBC, IRON, RETICCTPCT in the last 72 hours.  Urine analysis: No results found for: COLORURINE, APPEARANCEUR, LABSPEC, PHURINE, GLUCOSEU, HGBUR, BILIRUBINUR, KETONESUR, PROTEINUR, UROBILINOGEN, NITRITE, LEUKOCYTESUR  Sepsis Labs: Lactic Acid, Venous  Component Value Date/Time   LATICACIDVEN 1.08 09/16/2016 0451    MICROBIOLOGY: No results found for this or any previous visit (from the past 240 hour(s)).  RADIOLOGY STUDIES/RESULTS: US Abdomen Complete  Result Date: 09/16/2016 CLINICAL DATA:  Zero severe right upper quadrant pain associated with nausea for the past 2 days. History of gallstones, hyperlipidemia, lung malignancy. EXAM: ABDOMEN ULTRASOUND COMPLETE COMPARISON:  Abdominal and pelvic CT scan of April 28, 2012. FINDINGS: The patient was unable to cooperate with deep breathing maneuvers which limits the study. Gallbladder: The gallbladder is adequately distended. There are at least 2 echogenic mobile shadowing stones measuring up to 1.6 cm in diameter. Sludge is present as well. The gallbladder wall is top normal at 3 mm. There is a positive sonographic Murphy's sign reported by  the technologist. Common bile duct: Diameter: 6 mm where visualized. Liver: The hepatic echotexture is normal. There is no discrete mass or ductal dilation. IVC: Bowel gas limits evaluation of the IVC. Bowel gas limits evaluation of the pancreas. Pancreas: Visualized portion unremarkable. Spleen: Size and appearance within normal limits. Right Kidney: Length: 10.4 cm. In the upper pole of the right kidney there is a cystic structure with a few internal echoes measuring 4.4 cm in diameter. A cyst was previously demonstrated on the 2013 CT scan. Left Kidney: Length: 11 cm. The echotexture is normal. There is a prominent column of Bertin. Abdominal aorta: Normal where visualized. Other findings: There is no ascites. IMPRESSION: Gallstones. Positive sonographic Murphy's sign and top-normal gallbladder wall thickness may reflect acute cholecystitis. Non-simple cystic structure in the upper pole of the right kidney. This has increased slightly in size since the CT scan of September 2013. Electronically Signed   By: David  Martinique M.D.   On: 09/16/2016 08:52   Dg Chest Port 1 View  Result Date: 09/16/2016 CLINICAL DATA:  Preoperative examination. History of hypertension, aortic valve replacement, previous MI, partial left lower lobectomy. EXAM: PORTABLE CHEST 1 VIEW COMPARISON:  Chest x-ray of September 16, 2016 FINDINGS: The lungs are adequately inflated. The cardiac apex is obscured on the current exam but was normal on the earlier study. The heart is normal in size. There is a prosthetic aortic valve. The sternal wires are intact. There is calcification in the wall of the aortic arch. The pulmonary vascularity is not clearly engorged. IMPRESSION: New obscuration of the left heart border since this morning's acute abdominal series may reflect overlap of normal structures but acute lingular atelectasis is not excluded. Correlation with the patient's clinical and laboratory values is needed. No evidence of pulmonary  edema. Thoracic aortic atherosclerosis. Electronically Signed   By: David  Martinique M.D.   On: 09/16/2016 17:12   Dg Abd Acute W/chest  Result Date: 09/16/2016 CLINICAL DATA:  Initial evaluation for periodic right upper and left upper quadrant pain, right flank pain, and right lower quadrant pain. EXAM: DG ABDOMEN ACUTE W/ 1V CHEST COMPARISON:  Prior radiograph from 04/28/2012. FINDINGS: Median sternotomy wires underlying surgical clips and prostatic valve noted. Mild cardiomegaly is stable. Mediastinal silhouette within normal limits. Aortic atherosclerosis noted. Lungs normally inflated. Chronic coarsening of the interstitial markings. Blunting of left costophrenic angle felt to most likely reflect chronic pleural reaction/ scarring. No definite pleural effusion. No pulmonary edema. No focal infiltrates. No pneumothorax. Bowel gas pattern within normal limits without evidence for obstruction or ileus. No free air. No abnormal bowel wall thickening. Moderate amount of retained stool throughout the colon. No soft tissue mass or abnormal calcifications. Multilevel  degenerate spondylolysis noted within the visualized spine. Osteoarthritic changes present about the hips. Osteopenia. No acute osseous abnormality. IMPRESSION: 1. Nonobstructive bowel gas pattern with no radiographic evidence for acute intra-abdominal process. 2. Moderate amount of retained stool throughout the colon, suggesting constipation. 3. Blunting of the left costophrenic angle, suspected to likely reflect chronic pleural reaction/scarring. No other active cardiopulmonary disease identified. Electronically Signed   By: Jeannine Boga M.D.   On: 09/16/2016 05:56     LOS: 1 day   Rose Clousing, PAS  [employer]   If 7PM-7AM, please contact night-coverage [email or web address] Password [facility] 09/17/2016, 11:23 AM  Attending MD note  Patient was seen, examined,treatment plan was discussed with the PA-S.  I have personally reviewed the  clinical findings, lab, imaging studies and management of this patient in detail. I agree with the documentation, as recorded by the PA-S.   Patient continues to have an acute pain. But otherwise appears comfortable.  On Exam: Gen. exam: Awake, alert, not in any distress Chest: Good air entry bilaterally, no rhonchi or rales CVS: S1-S2 regular, no murmurs Abdomen: Soft, nondistended tender RUQ.  Neurology: Non-focal Skin: No rash or lesions  Impression: Acute calculus cholecystitis Bioprosthetic aortic valve replacement Hypertension COPD-stable  Plan: Continue empiric Zosyn Await eval by Dr Terrence Dupont Gen Surgery following-tentatively planned for Lap Cholecytstectomy later today  Rest as above  [facility]

## 2016-09-17 NOTE — Progress Notes (Signed)
Patient leaving unit for surgery. Pre-op checklist complete, report given to short stay via telephone, family going down with patient.

## 2016-09-17 NOTE — Transfer of Care (Signed)
Immediate Anesthesia Transfer of Care Note  Patient: Tracy Buck  Procedure(s) Performed: Procedure(s): LAPAROSCOPIC CHOLECYSTECTOMY (N/A)  Patient Location: PACU  Anesthesia Type:General  Level of Consciousness: awake, alert , oriented and patient cooperative  Airway & Oxygen Therapy: Patient Spontanous Breathing and Patient connected to nasal cannula oxygen  Post-op Assessment: Report given to RN, Post -op Vital signs reviewed and stable and Patient moving all extremities  Post vital signs: Reviewed and stable  Last Vitals:  Vitals:   09/17/16 0436 09/17/16 1425  BP: (!) 114/52 (!) 149/69  Pulse: 82 (!) 109  Resp: 18 (!) 25  Temp: 37.1 C 36.6 C    Last Pain:  Vitals:   09/17/16 0802  TempSrc:   PainSc: 3       Patients Stated Pain Goal: 2 (25/75/05 1833)  Complications: No apparent anesthesia complications

## 2016-09-17 NOTE — Anesthesia Postprocedure Evaluation (Signed)
Anesthesia Post Note  Patient: Tracy Buck  Procedure(s) Performed: Procedure(s) (LRB): LAPAROSCOPIC CHOLECYSTECTOMY (N/A)  Patient location during evaluation: PACU Anesthesia Type: General Level of consciousness: awake and alert and patient cooperative Pain management: pain level controlled Vital Signs Assessment: post-procedure vital signs reviewed and stable Respiratory status: spontaneous breathing and respiratory function stable Cardiovascular status: stable Anesthetic complications: no       Last Vitals:  Vitals:   09/17/16 1510 09/17/16 1514  BP: 128/71   Pulse: (!) 110 (!) 110  Resp: 17 16  Temp:  36.1 C    Last Pain:  Vitals:   09/17/16 1500  TempSrc:   PainSc: [place]

## 2016-09-17 NOTE — Op Note (Signed)
09/17/2016  2:13 PM  PATIENT:  Tracy Buck  82 y.o. female  PRE-OPERATIVE DIAGNOSIS:  Acute Cholecystitis  POST-OPERATIVE DIAGNOSIS: Acute supperative Cholecystitis  PROCEDURE:  Procedure(s): LAPAROSCOPIC CHOLECYSTECTOMY (N/A)  SURGEON:  Surgeon(s) and Role:    * Ralene Ok, MD - Primary  PHYSICIAN ASSISTANT: Melina Modena, PA-C  ANESTHESIA:   local and general  EBL:  Total I/O In: 3 [I.V.:3] Out: 500 [Urine:450; Blood:50]  BLOOD ADMINISTERED:none  DRAINS: none   LOCAL MEDICATIONS USED:  BUPIVICAINE   SPECIMEN:  Source of Specimen:  gallbladder  DISPOSITION OF SPECIMEN:  PATHOLOGY  COUNTS:  YES  TOURNIQUET:  * No tourniquets in log *  DICTATION: .Dragon Dictation The patient was taken to the operating and placed in the supine position with bilateral SCDs in place. The patient was prepped and draped in the usual sterile fashion. A time out was called and all facts were verified. A pneumoperitoneum was obtained via A Veress needle technique to a pressure of 59m of mercury.  A 59mtrochar was then placed in the right upper quadrant under visualization, and there were no injuries to any abdominal organs. A 11 mm port was then placed in the umbilical region after infiltrating with local anesthesia under direct visualization. A second and third epigastric port and right lower quadrant port placement under direct visualization, respectively. The gallbladder was seen to be very inflamed.  The gallbladder contents were suctioned out.  The gallbladder was then retracted, the peritoneum was then sharply dissected from the gallbladder and this dissection was carried down to Calot's triangle. The gallbladder was identified and stripped away circumferentially and seen going into the gallbladder 360, the critical angle was obtained.  2 clips were placed proximally one distally and the cystic duct transected. The cystic artery was identified and 2 clips placed proximally and one  distally and transected. We then proceeded to remove the gallbladder off the hepatic fossa with Bovie cautery. A retrieval bag was then placed in the abdomen and gallbladder placed in the bag. The hepatic fossa was then reexamined and hemostasis was achieved with Bovie cautery and was excellent at the end of the case. A piece of Snow surgicel was placed in the gallbladder fossa.  The subhepatic fossa and perihepatic fossa was then irrigated until the effluent was clear.  The gallbladder and bag were removed from the abdominal cavity. The 11 mm trocar fascia was reapproximated with the Endo Close #1 Vicryl x4. The pneumoperitoneum was evacuated and all trochars removed under direct visulalization. The skin was then closed with 4-0 Monocryl and the skin dressed with Steri-Strips, gauze, and tape. The patient was awaken from general anesthesia and taken to the recovery room in stable condition.   PLAN OF CARE: Admit to inpatient   PATIENT DISPOSITION:  PACU - hemodynamically stable.   Delay start of Pharmacological VTE agent (>24hrs) due to surgical blood loss or risk of bleeding: not applicable

## 2016-09-17 NOTE — Progress Notes (Signed)
  Echocardiogram 2D Echocardiogram has been performed.  [name] 09/17/2016, 10:50 AM

## 2016-09-18 ENCOUNTER — Encounter (HOSPITAL_COMMUNITY): Payer: Self-pay | Admitting: General Surgery

## 2016-09-18 LAB — GLUCOSE, CAPILLARY
GLUCOSE-CAPILLARY: 155 mg/dL — AB (ref 65–99)
Glucose-Capillary: 123 mg/dL — ABNORMAL HIGH (ref 65–99)
Glucose-Capillary: 121 mg/dL — ABNORMAL HIGH (ref 65–99)
Glucose-Capillary: 102 mg/dL — ABNORMAL HIGH (ref 65–99)

## 2016-09-18 MED ORDER — ONDANSETRON HCL 4 MG/2ML IJ SOLN: 4.0000 mg | Freq: Four times a day (QID) | INTRAMUSCULAR | Status: DC | PRN

## 2016-09-18 MED ORDER — GI COCKTAIL ~~LOC~~
30.0000 mL | Freq: Three times a day (TID) | ORAL | Status: DC | PRN
  Administered 2016-09-18: 30 mL via ORAL
  Filled 2016-09-18 (×2): qty 30

## 2016-09-18 MED ORDER — ENOXAPARIN SODIUM 40 MG/0.4ML ~~LOC~~ SOLN
40.0000 mg | Freq: Every day | SUBCUTANEOUS | Status: DC
  Administered 2016-09-18 – 2016-09-19 (×2): 40 mg via SUBCUTANEOUS
  Filled 2016-09-18 (×2): qty 0.4

## 2016-09-18 MED ORDER — CALCIUM CARBONATE ANTACID 500 MG PO CHEW
400.0000 mg | CHEWABLE_TABLET | Freq: Three times a day (TID) | ORAL | Status: DC
  Administered 2016-09-18 – 2016-09-19 (×3): 400 mg via ORAL
  Filled 2016-09-18 (×3): qty 2

## 2016-09-18 MED ORDER — PANTOPRAZOLE SODIUM 40 MG PO TBEC
40.0000 mg | DELAYED_RELEASE_TABLET | Freq: Two times a day (BID) | ORAL | Status: DC
  Administered 2016-09-18 – 2016-09-19 (×3): 40 mg via ORAL
  Filled 2016-09-18 (×3): qty 1

## 2016-09-18 NOTE — Progress Notes (Signed)
Patient has put out 300 in urine since arriving back to unit at [facility]. Bladder scan was done, only 65 was noted in bladder. Fluid intake was encouraged, will continue to monitor I&O's.  Jimmie Molly, RN

## 2016-09-18 NOTE — Progress Notes (Signed)
ANTICOAGULATION CONSULT NOTE - Initial Consult  Pharmacy Consult for lovenox Indication: VTE prophylaxis  Allergies  Allergen Reactions  . Fig Extract [Ficus] Anaphylaxis  . Sulfa Antibiotics Other (See Comments)    Severe nervousness.    Patient Measurements: Height: '5\' 4"'$  (162.6 cm) Weight: 170 lb (77.1 kg) IBW/kg (Calculated) : 54.7  Vital Signs: Temp: 97.9 F (36.6 C) (01/27 0445) Temp Source: Oral (01/27 0445) BP: 108/54 (01/27 0445) Pulse Rate: 79 (01/27 0445)  Labs:  Recent Labs  09/16/16 0440 09/16/16 1655 09/17/16 0521  HGB 13.9 13.8 12.7  HCT 40.5 40.4 37.6  PLT 178 175 166  APTT  --  31  --   LABPROT  --  13.9  --   INR  --  1.07  --   CREATININE 0.84 0.79 1.05*  TROPONINI  --  0.05*  --     Estimated Creatinine Clearance: 43.7 mL/min (by C-G formula based on SCr of 1.05 mg/dL (H)).   Medical History: Past Medical History:  Diagnosis Date  . Arthritis    "fingers, knees" (09/16/2016)  . Complication of anesthesia    "I often go into severe panic attacks when they try to bring me out and they have to put me back under; sometimes happens when they are starting the anesthesia also" (09/16/2016)  . COPD (chronic obstructive pulmonary disease) ([place])    "I think this has been recently dx'd" (09/16/2016)  . Heart valve replaced   . Hyperlipidemia   . Hypertension   . Lung cancer ([place]) 2013  . Lung mass   . Myocardial infarct   . Panic attack   . Pneumonia    "when I was very young" (09/16/2016)  . Rib fracture   . Skin cancer of nose    Assessment: 82 y.o. known to pharmacy from abx dosing. To begin Lovenox for VTE prophylaxis. Est CrCl > 30 ml/min. CBC stable.  Goal of Therapy:  Prevention of VTE   Plan:  Lovenox '40mg'$  SQ q24h Pharmacy will sign off - please reconsult if needed  Sherlon Handing, PharmD, BCPS Clinical pharmacist, pager [phone] 09/18/2016,6:48 AM

## 2016-09-18 NOTE — Progress Notes (Signed)
PROGRESS NOTE        PATIENT DETAILS Name: Tracy Buck Age: 82 y.o. Sex: female Date of Birth: 1935/07/10 Admit Date: 09/16/2016 Admitting Physician Waldemar Dickens, MD JJK:[id number], Meda Coffee, MD  Brief Narrative: Patient is a 82 y.o. Female with h/o lung cancer, COPD, HTN, HLD, and aortic valve replacement with bovine valve presented to the ED on 1/25 with right upper quadrant abdominal pain, found to have acute cholecystitis. Gen. surgery consulted, she underwent laparoscopic cholecystectomy on 1/26. See below for further details.  Subjective: Abdominal pain has improved-her main complaint this morning is reflux pain.  Assessment/Plan: Acute calculus cholecystitis: Underwent laparoscopic cholecystectomy on 1/26, abdomen is appropriately tender today-diet being advanced. Gen. surgery following. Continue supportive measures and empiric antibiotics.   COPD (chronic obstructive pulmonary disease): No exacerbations so far during this admission, O2 sat is 95% Ra. Continue daily tiotropium, and mometasone-formoterol inhalers. Use albuerol nebulizer prn.  Encourage incentive spirometry    Essential Hypertension: Controlled, continue to hold antihypertensives-resume when able.   Hyperlipidemia: Resume statin in the next few days  Hx of Right Renal Cyst: Originally found on 2013 CT scan. Present on abdominal u/s 09/16/16 as well. This is stable for outpatient follow-up  S/P bioprosthetic AVR: Continue outpatient follow-up up with cardiology. 2-D echo on 1/26 without any regurgitation. On Zosyn empirically.   History of CAD S/P CABG (coronary artery bypass graft): Without any anginal symptoms, systolic function within normal limits on echocardiogram on 1/26. Seems to have tolerated surgery very well.   S/P Lobectomy: Left lower lobe 2013.   Others: Mobilize with PT/out of bed to chair/stop IV fluids   DVT Prophylaxis: Prophylactic Lovenox  Code Status: Full  code  Family Communication: None  Disposition Plan: Remain inpatient  Antimicrobial agents: Anti-infectives    Start     Dose/Rate Route Frequency Ordered Stop   09/16/16 1715  piperacillin-tazobactam (ZOSYN) IVPB 3.375 g     3.375 g 12.5 mL/hr over 240 Minutes Intravenous Every 8 hours 09/16/16 1710     09/16/16 0945  levofloxacin (LEVAQUIN) IVPB 500 mg     500 mg 100 mL/hr over 60 Minutes Intravenous  Once 09/16/16 0939 09/16/16 1415   09/16/16 0945  metroNIDAZOLE (FLAGYL) IVPB 500 mg     500 mg 100 mL/hr over 60 Minutes Intravenous  Once 09/16/16 9937 09/16/16 1148      Procedures: Echocardiogram 1/26>> - Left ventricle: The cavity size was normal. There was moderate   asymmetric hypertrophy of the septum. Systolic function was   hyperdynamic. The estimated ejection fraction was in the range of   65% to 70%. Wall motion was normal; there were no regional wall   motion abnormalities. Features are consistent with a pseudonormal   left ventricular filling pattern, with concomitant abnormal   relaxation and increased filling pressure (grade 2 diastolic   dysfunction). - Ventricular septum: Septal motion showed paradox. - Aortic valve: A bioprosthesis was present. - Mitral valve: Although there was no diagnostic evidence for   systolic anterior motion, this possibility cannot be completely   excluded on the basis of this study. - Left atrium: The atrium was mildly dilated. - Pulmonary arteries: Systolic pressure was mildly to moderately   increased. PA peak pressure: 45 mm Hg (S).  CONSULTS: Gen Surgery  Time spent: 25 minutes-Greater than 50% of this time was spent in counseling,  explanation of diagnosis, planning of further management, and coordination of care.  MEDICATIONS: Scheduled Meds: . calcium carbonate  400 mg of elemental calcium Oral TID  . enoxaparin (LOVENOX) injection  40 mg Subcutaneous Daily  . mometasone-formoterol  2 puff Inhalation BID  .  pantoprazole  40 mg Oral BID  . piperacillin-tazobactam (ZOSYN)  IV  3.375 g Intravenous Q8H  . sodium chloride flush  3 mL Intravenous Q12H  . tiotropium  18 mcg Inhalation Daily   Continuous Infusions:  PRN Meds:.albuterol, fentaNYL (SUBLIMAZE) injection, gi cocktail, HYDROcodone-acetaminophen   PHYSICAL EXAM: Vital signs: Vitals:   09/17/16 2115 09/18/16 0217 09/18/16 0445 09/18/16 1026  BP: 112/62 (!) 106/54 (!) 108/54 (!) 103/57  Pulse: 93 76 79 80  Resp: '17 18 18 18  '$ Temp: 98.2 F (36.8 C) 97.6 F (36.4 C) 97.9 F (36.6 C) 98 F (36.7 C)  TempSrc: Oral Oral Oral Oral  SpO2: 98% 99% 99% 96%  Weight:      Height:       Filed Weights   09/16/16 0434  Weight: 77.1 kg (170 lb)   Body mass index is 29.18 kg/m.   General appearance: Awake, alert, not in any distress. Speech Clear. Not toxic Looking Eyes: PERRLA, no scleral icterus. Pink conjunctiva HEENT: Atraumatic and Normocephalic Neck: Supple, no JVD. No cervical lymphadenopathy. No thyromegaly Resp:Good air entry bilaterally, CTAB.  CVS: S1 S2 regular, systolic crescendo murmur heard best in aortic region.  GI: Bowel sounds present. Abdomen is soft and appropriately tender Extremities: B/L Lower Ext shows no edema, both legs are warm to touch Neurology:  Speech clear, Non focal, sensation is grossly intact. Psychiatric: Normal judgment and insight. AAO x 3. Normal mood. Musculoskeletal:No digital cyanosis Skin:No Rash, warm and dry Wounds:N/A  I have personally reviewed following labs and imaging studies  LABORATORY DATA: CBC:  Recent Labs Lab 09/16/16 0440 09/16/16 1655 09/17/16 0521  WBC 14.2* 15.4* 17.9*  NEUTROABS 11.6*  --   --   HGB 13.9 13.8 12.7  HCT 40.5 40.4 37.6  MCV 88.8 88.6 89.1  PLT 178 175 865    Basic Metabolic Panel:  Recent Labs Lab 09/16/16 0440 09/16/16 1655 09/17/16 0521  NA 132* 133* 133*  K 3.6 3.8 3.7  CL 101 102 104  CO2 22 21* 24  GLUCOSE 157* 145* 141*  BUN  '16 11 12  '$ CREATININE 0.84 0.79 1.05*  CALCIUM 9.0 8.5* 8.3*    GFR: Estimated Creatinine Clearance: 43.7 mL/min (by C-G formula based on SCr of 1.05 mg/dL (H)).  Liver Function Tests:  Recent Labs Lab 09/16/16 0440  AST 29  ALT 24  ALKPHOS 76  BILITOT 1.0  PROT 6.9  ALBUMIN 4.1    Recent Labs Lab 09/16/16 0440  LIPASE 20   No results for input(s): AMMONIA in the last 168 hours.  Coagulation Profile:  Recent Labs Lab 09/16/16 1655  INR 1.07    Cardiac Enzymes:  Recent Labs Lab 09/16/16 1655  TROPONINI 0.05*    BNP (last 3 results) No results for input(s): PROBNP in the last 8760 hours.  HbA1C: No results for input(s): HGBA1C in the last 72 hours.  CBG:  Recent Labs Lab 09/17/16 0754 09/17/16 1151 09/17/16 1717 09/17/16 2125 09/18/16 0730  GLUCAP 140* 117* 133* 191* 123*    Lipid Profile: No results for input(s): CHOL, HDL, LDLCALC, TRIG, CHOLHDL, LDLDIRECT in the last 72 hours.  Thyroid Function Tests: No results for input(s): TSH, T4TOTAL, FREET4, T3FREE, THYROIDAB in  the last 72 hours.  Anemia Panel: No results for input(s): VITAMINB12, FOLATE, FERRITIN, TIBC, IRON, RETICCTPCT in the last 72 hours.  Urine analysis: No results found for: COLORURINE, APPEARANCEUR, LABSPEC, PHURINE, GLUCOSEU, HGBUR, BILIRUBINUR, KETONESUR, PROTEINUR, UROBILINOGEN, NITRITE, LEUKOCYTESUR  Sepsis Labs: Lactic Acid, Venous    Component Value Date/Time   LATICACIDVEN 1.08 09/16/2016 0451    MICROBIOLOGY: Recent Results (from the past 240 hour(s))  MRSA PCR Screening     Status: None   Collection Time: 09/17/16 10:36 AM  Result Value Ref Range Status   MRSA by PCR NEGATIVE NEGATIVE Final    Comment:        The GeneXpert MRSA Assay (FDA approved for NASAL specimens only), is one component of a comprehensive MRSA colonization surveillance program. It is not intended to diagnose MRSA infection nor to guide or monitor treatment for MRSA infections.      RADIOLOGY STUDIES/RESULTS: US Abdomen Complete  Result Date: 09/16/2016 CLINICAL DATA:  Zero severe right upper quadrant pain associated with nausea for the past 2 days. History of gallstones, hyperlipidemia, lung malignancy. EXAM: ABDOMEN ULTRASOUND COMPLETE COMPARISON:  Abdominal and pelvic CT scan of April 28, 2012. FINDINGS: The patient was unable to cooperate with deep breathing maneuvers which limits the study. Gallbladder: The gallbladder is adequately distended. There are at least 2 echogenic mobile shadowing stones measuring up to 1.6 cm in diameter. Sludge is present as well. The gallbladder wall is top normal at 3 mm. There is a positive sonographic Murphy's sign reported by the technologist. Common bile duct: Diameter: 6 mm where visualized. Liver: The hepatic echotexture is normal. There is no discrete mass or ductal dilation. IVC: Bowel gas limits evaluation of the IVC. Bowel gas limits evaluation of the pancreas. Pancreas: Visualized portion unremarkable. Spleen: Size and appearance within normal limits. Right Kidney: Length: 10.4 cm. In the upper pole of the right kidney there is a cystic structure with a few internal echoes measuring 4.4 cm in diameter. A cyst was previously demonstrated on the 2013 CT scan. Left Kidney: Length: 11 cm. The echotexture is normal. There is a prominent column of Bertin. Abdominal aorta: Normal where visualized. Other findings: There is no ascites. IMPRESSION: Gallstones. Positive sonographic Murphy's sign and top-normal gallbladder wall thickness may reflect acute cholecystitis. Non-simple cystic structure in the upper pole of the right kidney. This has increased slightly in size since the CT scan of September 2013. Electronically Signed   By: David  Martinique M.D.   On: 09/16/2016 08:52   Dg Chest Port 1 View  Result Date: 09/16/2016 CLINICAL DATA:  Preoperative examination. History of hypertension, aortic valve replacement, previous MI, partial left  lower lobectomy. EXAM: PORTABLE CHEST 1 VIEW COMPARISON:  Chest x-ray of September 16, 2016 FINDINGS: The lungs are adequately inflated. The cardiac apex is obscured on the current exam but was normal on the earlier study. The heart is normal in size. There is a prosthetic aortic valve. The sternal wires are intact. There is calcification in the wall of the aortic arch. The pulmonary vascularity is not clearly engorged. IMPRESSION: New obscuration of the left heart border since this morning's acute abdominal series may reflect overlap of normal structures but acute lingular atelectasis is not excluded. Correlation with the patient's clinical and laboratory values is needed. No evidence of pulmonary edema. Thoracic aortic atherosclerosis. Electronically Signed   By: David  Martinique M.D.   On: 09/16/2016 17:12   Dg Abd Acute W/chest  Result Date: 09/16/2016 CLINICAL DATA:  Initial  evaluation for periodic right upper and left upper quadrant pain, right flank pain, and right lower quadrant pain. EXAM: DG ABDOMEN ACUTE W/ 1V CHEST COMPARISON:  Prior radiograph from 04/28/2012. FINDINGS: Median sternotomy wires underlying surgical clips and prostatic valve noted. Mild cardiomegaly is stable. Mediastinal silhouette within normal limits. Aortic atherosclerosis noted. Lungs normally inflated. Chronic coarsening of the interstitial markings. Blunting of left costophrenic angle felt to most likely reflect chronic pleural reaction/ scarring. No definite pleural effusion. No pulmonary edema. No focal infiltrates. No pneumothorax. Bowel gas pattern within normal limits without evidence for obstruction or ileus. No free air. No abnormal bowel wall thickening. Moderate amount of retained stool throughout the colon. No soft tissue mass or abnormal calcifications. Multilevel degenerate spondylolysis noted within the visualized spine. Osteoarthritic changes present about the hips. Osteopenia. No acute osseous abnormality. IMPRESSION:  1. Nonobstructive bowel gas pattern with no radiographic evidence for acute intra-abdominal process. 2. Moderate amount of retained stool throughout the colon, suggesting constipation. 3. Blunting of the left costophrenic angle, suspected to likely reflect chronic pleural reaction/scarring. No other active cardiopulmonary disease identified. Electronically Signed   By: Jeannine Boga M.D.   On: 09/16/2016 05:56     LOS: 2 days   GHIMIRE,SHANKER  If 7PM-7AM, please contact night-coverage [email or web address] Password [facility] 09/18/2016, 11:16 AM

## 2016-09-18 NOTE — Progress Notes (Addendum)
1 Day Post-Op  Subjective: Pain better.  Complains of reflux.  Voiding.  Has not had anything to yet.  Has not been out of bed much. Afebrile.  BP 108/54.  Heart rate 79. Lives alone but plans to go home with daughters when ready.   Objective: Vital signs in last 24 hours: Temp:  [97 F (36.1 C)-98.2 F (36.8 C)] 97.9 F (36.6 C) (01/27 0445) Pulse Rate:  [76-115] 79 (01/27 0445) Resp:  [14-26] 18 (01/27 0445) BP: (106-149)/(43-71) 108/54 (01/27 0445) SpO2:  [93 %-99 %] 99 % (01/27 0445) Last BM Date: 09/16/16  Intake/Output from previous day: 01/26 0701 - 01/27 0700 In: 1343 [P.O.:240; I.V.:1003; IV Piggyback:100] Out: 800 [Urine:750; Blood:50] Intake/Output this shift: Total I/O In: 220 [P.O.:120; IV Piggyback:100] Out: 300 [Urine:300]  General appearance: Alert.  Pleasant.  Minimal distress. Resp: clear to auscultation bilaterally GI: Soft.  Protuberant.  Wounds look fine.  A little bit tender everywhere.  Lab Results:  Results for orders placed or performed during the hospital encounter of 09/16/16 (from the past 24 hour(s))  Glucose, capillary     Status: Abnormal   Collection Time: 09/17/16  7:54 AM  Result Value Ref Range   Glucose-Capillary 140 (H) 65 - 99 mg/dL   Comment 1 Notify RN   MRSA PCR Screening     Status: None   Collection Time: 09/17/16 10:36 AM  Result Value Ref Range   MRSA by PCR NEGATIVE NEGATIVE  Glucose, capillary     Status: Abnormal   Collection Time: 09/17/16 11:51 AM  Result Value Ref Range   Glucose-Capillary 117 (H) 65 - 99 mg/dL   Comment 1 Notify RN   Glucose, capillary     Status: Abnormal   Collection Time: 09/17/16  5:17 PM  Result Value Ref Range   Glucose-Capillary 133 (H) 65 - 99 mg/dL   Comment 1 Notify RN   Glucose, capillary     Status: Abnormal   Collection Time: 09/17/16  9:25 PM  Result Value Ref Range   Glucose-Capillary 191 (H) 65 - 99 mg/dL     Studies/Results: No results found.  . mometasone-formoterol  2  puff Inhalation BID  . piperacillin-tazobactam (ZOSYN)  IV  3.375 g Intravenous Q8H  . sodium chloride flush  3 mL Intravenous Q12H  . tiotropium  18 mcg Inhalation Daily     Assessment/Plan: s/p Procedure(s): LAPAROSCOPIC CHOLECYSTECTOMY  POD #1.  Laparoscopic cholecystectomy for acute supperative  cholecystitis Stable Mobilize out of bed Zosyn Advance diet as tolerated Probably home tomorrow.  GERD symptoms.  Will allow TUMS and proton pump inhibitors  History CABG and bovine aVR. History left lower lobectomy for lung cancer 2013 COPD-per internal medicine HTN - currently normotensive.  Per internal medicine VTE prophylaxis - start lovenox  '@PROBHOSP'$ @  LOS: 2 days    Tracy Buck M 09/18/2016  . .prob

## 2016-09-18 NOTE — Evaluation (Signed)
Physical Therapy Evaluation Patient Details Name: Tracy Buck MRN: [id number] DOB: [birth year] Today's Date: 09/18/2016   History of Present Illness  82 y.o. female with medical history significant of  lung cancer, COPD, hypertension, hyperlipidemia, aortic valve replacement with bovine valve. Pt admitted 09-16-16 with cholecystitis and underwent lap chole 09-17-16.  Clinical Impression  Pt admitted with above diagnosis. Pt currently with functional limitations due to the deficits listed below (see PT Problem List). On eval, pt required min guard assist for transfers and gait 300 feet with RW.  Pt will benefit from skilled PT to increase their independence and safety with mobility to allow discharge to the venue listed below.  She plans to discharge to her daughter's house who will provide 24-hour assist. Pt has all needed DME. PT to follow acutely. No f/u services indicated.     Follow Up Recommendations No PT follow up;Supervision/Assistance - 24 hour    Equipment Recommendations  None recommended by PT    Recommendations for Other Services       Precautions / Restrictions Precautions Precautions: Fall      Mobility  Bed Mobility               General bed mobility comments: Pt received in recliner.  Transfers Overall transfer level: Needs assistance Equipment used: Rolling walker (2 wheeled) Transfers: Sit to/from Stand Sit to Stand: Min guard         General transfer comment: min guard for safety only. Increased time to stabilize initial standing balance.  Ambulation/Gait Ambulation/Gait assistance: Min guard Ambulation Distance (Feet): 300 Feet Assistive device: Rolling walker (2 wheeled) Gait Pattern/deviations: Step-through pattern;Decreased stride length;Trunk flexed Gait velocity: decreased Gait velocity interpretation: Below normal speed for age/gender General Gait Details: mild trunk flexion due to abdominal pain. C/o slight lightheadedness. Slow, steady  gait.  Stairs            Wheelchair Mobility    Modified Rankin (Stroke Patients Only)       Balance Overall balance assessment: Needs assistance Sitting-balance support: No upper extremity supported;Feet supported Sitting balance-Leahy Scale: Good     Standing balance support: Bilateral upper extremity supported;During functional activity Standing balance-Leahy Scale: Fair Standing balance comment: RW needed for ambulation.                             Pertinent Vitals/Pain Pain Assessment: 0-10 Pain Score: 2  Pain Location: abdomen Pain Descriptors / Indicators: Sore Pain Intervention(s): Monitored during session    Home Living Family/patient expects to be discharged to:: Private residence Living Arrangements: Children Available Help at Discharge: Family;Available 24 hours/day Type of Home: House Home Access: Stairs to enter Entrance Stairs-Rails: Psychiatric nurse of Steps: 4 Home Layout: Two level;Bed/bath upstairs Home Equipment: Walker - 2 wheels;Cane - single point;Bedside commode;Shower seat Additional Comments: Pt plans to d/c to her daughter's house. Above info is for that residence.    Prior Function Level of Independence: Independent         Comments: Lives alone. Active.     Hand Dominance        Extremity/Trunk Assessment   Upper Extremity Assessment Upper Extremity Assessment: Overall WFL for tasks assessed    Lower Extremity Assessment Lower Extremity Assessment: Generalized weakness    Cervical / Trunk Assessment Cervical / Trunk Assessment: Normal  Communication   Communication: No difficulties  Cognition Arousal/Alertness: Awake/alert Behavior During Therapy: WFL for tasks assessed/performed Overall Cognitive Status: Within Functional Limits  for tasks assessed                      General Comments      Exercises     Assessment/Plan    PT Assessment Patient needs continued PT  services  PT Problem List Decreased strength;Decreased activity tolerance;Decreased balance;Decreased mobility;Pain          PT Treatment Interventions DME instruction;Gait training;Stair training;Functional mobility training;Therapeutic activities;Therapeutic exercise;Balance training;Patient/family education    PT Goals (Current goals can be found in the Care Plan section)  Acute Rehab PT Goals Patient Stated Goal: independence PT Goal Formulation: With patient Time For Goal Achievement: 10/02/16 Potential to Achieve Goals: Good    Frequency Min 3X/week   Barriers to discharge        Co-evaluation               End of Session Equipment Utilized During Treatment: Gait belt Activity Tolerance: Patient tolerated treatment well Patient left: in chair;with call bell/phone within reach;with family/visitor present Nurse Communication: Mobility status         Time: 7915-0569 PT Time Calculation (min) (ACUTE ONLY): 17 min   Charges:   PT Evaluation $PT Eval Moderate Complexity: 1 Procedure     PT G CodesLorriane Shire 09/18/2016, 10:24 AM

## 2016-09-19 LAB — GLUCOSE, CAPILLARY: Glucose-Capillary: 90 mg/dL (ref 65–99)

## 2016-09-19 MED ORDER — LISINOPRIL-HYDROCHLOROTHIAZIDE 20-12.5 MG PO TABS: 0.5000 | ORAL_TABLET | Freq: Every day | ORAL | Status: DC

## 2016-09-19 MED ORDER — POLYETHYLENE GLYCOL 3350 17 G PO PACK: 17.0000 g | PACK | Freq: Every day | ORAL | Status: DC

## 2016-09-19 MED ORDER — POLYETHYLENE GLYCOL 3350 17 G PO PACK
17.0000 g | PACK | Freq: Every day | ORAL | Status: DC
  Administered 2016-09-19: 17 g via ORAL
  Filled 2016-09-19: qty 1

## 2016-09-19 MED ORDER — HYDROCODONE-ACETAMINOPHEN 5-325 MG PO TABS: 1.0000 | ORAL_TABLET | Freq: Four times a day (QID) | ORAL | 0 refills | Status: DC | PRN

## 2016-09-19 NOTE — Progress Notes (Signed)
Physical Therapy Treatment Patient Details Name: Tracy Buck MRN: [id number] DOB: Jan 08, 1935 Today's Date: 09/19/2016    History of Present Illness 82 y.o. female with medical history significant of  lung cancer, COPD, hypertension, hyperlipidemia, aortic valve replacement with bovine valve. Pt admitted 09-16-16 with cholecystitis and underwent lap chole 09-17-16.    PT Comments    Pt presents with good mobility, but has increased abdominal discomfort with gait. Performed stair negotiation without increased discomfort. Pt requires 1 standing rest break with gait this session due to fatigue. Pt instructed on continuing with gait once returning home.    Follow Up Recommendations  No PT follow up;Supervision/Assistance - 24 hour     Equipment Recommendations  None recommended by PT    Recommendations for Other Services       Precautions / Restrictions Precautions Precautions: Fall Restrictions Weight Bearing Restrictions: No    Mobility  Bed Mobility Overal bed mobility: Needs Assistance Bed Mobility: Supine to Sit     Supine to sit: Min assist;HOB elevated     General bed mobility comments: Requires assistance to sit EOB due to abdominal discomfort  Transfers Overall transfer level: Needs assistance Equipment used: Rolling walker (2 wheeled) Transfers: Sit to/from Stand Sit to Stand: Min guard         General transfer comment: min guard for safety only. Increased time to stabilize initial standing balance.  Ambulation/Gait Ambulation/Gait assistance: Min guard Ambulation Distance (Feet): 350 Feet Assistive device: Rolling walker (2 wheeled) Gait Pattern/deviations: Step-through pattern;Decreased stride length;Trunk flexed Gait velocity: decreased Gait velocity interpretation: Below normal speed for age/gender General Gait Details: mild trunk flexion due to abdominal pain. C/o slight lightheadedness. Slow, steady gait.   Stairs Stairs: Yes   Stair  Management: One rail Left;Step to pattern;Forwards Number of Stairs: 4 General stair comments: good sequencing  Wheelchair Mobility    Modified Rankin (Stroke Patients Only)       Balance                                    Cognition Arousal/Alertness: Awake/alert Behavior During Therapy: WFL for tasks assessed/performed Overall Cognitive Status: Within Functional Limits for tasks assessed                      Exercises      General Comments        Pertinent Vitals/Pain Pain Assessment: 0-10 Pain Score: 1  Pain Location: abdomen Pain Descriptors / Indicators: Sore Pain Intervention(s): Monitored during session    Home Living                      Prior Function            PT Goals (current goals can now be found in the care plan section) Acute Rehab PT Goals Patient Stated Goal: independence Progress towards PT goals: Progressing toward goals    Frequency    Min 3X/week      PT Plan Current plan remains appropriate    Co-evaluation             End of Session Equipment Utilized During Treatment: Gait belt Activity Tolerance: Patient tolerated treatment well Patient left: in chair;with call bell/phone within reach     Time: 1026-1046 PT Time Calculation (min) (ACUTE ONLY): 20 min  Charges:  $Gait Training: 8-22 mins  G Codes:      Scheryl Marten PT, DPT  [phone]  09/19/2016, 1:14 PM

## 2016-09-19 NOTE — Discharge Summary (Signed)
PATIENT DETAILS Name: Tracy Buck Age: 82 y.o. Sex: female Date of Birth: [birth year] MRN: [id number]. Admitting Physician: Waldemar Dickens, MD OVZ:CHYIFOY, Meda Coffee, MD  Admit Date: 09/16/2016 Discharge date: 09/19/2016  Recommendations for Outpatient Follow-up:  1. Follow up with PCP in 1-2 weeks 2. Please obtain BMP/CBC in one week 3. Please follow up ensure on the following with Gen Surg 4. Has Right renal cyst-stable for outpatient follow up with PCP-defer to PCP  Admitted From:  Home  Disposition: [place]: No  Equipment/Devices: None  Discharge Condition: Stable  CODE STATUS: FULL CODE  Diet recommendation:  Heart Healthy  Brief Summary: See H&P, Labs, Consult and Test reports for all details in brief, Patient is a 82 y.o. Female with h/o lung cancer, COPD, HTN, HLD, and aortic valve replacement with bovine valve presented to the ED on 1/25 with right upper quadrant abdominal pain, found to have acute cholecystitis. Gen. surgery consulted, she underwent laparoscopic cholecystectomy on 1/26. See below for further details.  Brief Hospital Course: Acute calculus cholecystitis: Underwent laparoscopic cholecystectomy on 1/26, abdomen is only minimally but appropriately tender today-diet has been advanced. Gen. surgery recommends to d/c home today, spoke with Dr Marlene Bast need for any further antibiotics. Patient instructed to follow with Gen Surgery as instructed.   COPD (chronic obstructive pulmonary disease): No exacerbations so far during this admission, O2 sat is 95% Ra. Continue daily tiotropium, and mometasone-formoterol inhalers. Use albuerol nebulizer prn.      Essential Hypertension: Controlled, resume antihypertensives on discharge.   Hyperlipidemia: Statins initially held-Resume statin on discharge  Hx of Right Renal Cyst: Originally found on 2013 CT scan. Present on abdominal u/s 09/16/16 as well. This is stable for outpatient follow-up-defer  to PCP  S/P bioprosthetic AVR: Continue outpatient follow-up up with cardiology. 2-D echo on 1/26 without any regurgitation. Ensure follow up with cardiology-was on perioperative ZOSYN  History of CAD S/P CABG (coronary artery bypass graft): Without any anginal symptoms, systolic function within normal limits on echocardiogram on 1/26. Seems to have tolerated surgery very well.   S/P Lobectomy: Left lower lobe 2013.   Procedures/Studies: Echocardiogram 1/26>> - Left ventricle: The cavity size was normal. There was moderate asymmetric hypertrophy of the septum. Systolic function was hyperdynamic. The estimated ejection fraction was in the range of 65% to 70%. Wall motion was normal; there were no regional wall motion abnormalities. Features are consistent with a pseudonormal left ventricular filling pattern, with concomitant abnormal relaxation and increased filling pressure (grade 2 diastolic dysfunction). - Ventricular septum: Septal motion showed paradox. - Aortic valve: A bioprosthesis was present. - Mitral valve: Although there was no diagnostic evidence for systolic anterior motion, this possibility cannot be completely excluded on the basis of this study. - Left atrium: The atrium was mildly dilated. - Pulmonary arteries: Systolic pressure was mildly to moderately increased. PA peak pressure: 45 mm Hg (S).  Discharge Diagnoses:  Active Problems:   Hyperlipidemia   Cholecystitis   S/P AVR   S/P CABG (coronary artery bypass graft)   Essential hypertension   COPD (chronic obstructive pulmonary disease) (HCC)   Hyperglycemia   Pre-op evaluation   Discharge Instructions:  Activity:  As tolerated with Full fall precautions use walker/cane & assistance as needed   Discharge Instructions    Call MD for:  redness, tenderness, or signs of infection (pain, swelling, redness, odor or green/yellow discharge around incision site)    Complete by:  As  directed    Diet -  low sodium heart healthy    Complete by:  As directed    Discharge instructions    Complete by:  As directed    Follow with Primary MD  Drake Leach, MD in 1 week  Follow with Dr Rosendo Gros (Surgery) in 2-3 weeks  You have a right kidney cyst that was seen on abdominal Ultrasound-please ask your Primary MD to intiate further work up with a CT or MRI scan   Please get a complete blood count and chemistry panel checked by your Primary MD at your next visit, and again as instructed by your Primary MD.  Get Medicines reviewed and adjusted: Please take all your medications with you for your next visit with your Primary MD  Laboratory/radiological data: Please request your Primary MD to go over all hospital tests and procedure/radiological results at the follow up, please ask your Primary MD to get all Hospital records sent to his/her office.  In some cases, they will be blood work, cultures and biopsy results pending at the time of your discharge. Please request that your primary care M.D. follows up on these results.  Also Note the following: If you experience worsening of your admission symptoms, develop shortness of breath, life threatening emergency, suicidal or homicidal thoughts you must seek medical attention immediately by calling 911 or calling your MD immediately  if symptoms less severe.  You must read complete instructions/literature along with all the possible adverse reactions/side effects for all the Medicines you take and that have been prescribed to you. Take any new Medicines after you have completely understood and accpet all the possible adverse reactions/side effects.   Do not drive when taking Pain medications or sleeping medications (Benzodaizepines)  Do not take more than prescribed Pain, Sleep and Anxiety Medications. It is not advisable to combine anxiety,sleep and pain medications without talking with your primary care practitioner  Special  Instructions: If you have smoked or chewed Tobacco  in the last 2 yrs please stop smoking, stop any regular Alcohol  and or any Recreational drug use.  Wear Seat belts while driving.  Please note: You were cared for by a hospitalist during your hospital stay. Once you are discharged, your primary care physician will handle any further medical issues. Please note that NO REFILLS for any discharge medications will be authorized once you are discharged, as it is imperative that you return to your primary care physician (or establish a relationship with a primary care physician if you do not have one) for your post hospital discharge needs so that they can reassess your need for medications and monitor your lab values.   Increase activity slowly    Complete by:  As directed      Allergies as of 09/19/2016      Reactions   Fig Extract [ficus] Anaphylaxis   Sulfa Antibiotics Other (See Comments)   Severe nervousness.      Medication List    TAKE these medications   acetaminophen 500 MG tablet Commonly known as:  TYLENOL Take 500 mg by mouth every 6 (six) hours as needed. For pain.   albuterol 108 (90 Base) MCG/ACT inhaler Commonly known as:  PROVENTIL HFA;VENTOLIN HFA Inhale 2 puffs into the lungs every 6 (six) hours as needed. Patient uses 2 puffs of this medication at 6 am.   albuterol (2.5 MG/3ML) 0.083% nebulizer solution Commonly known as:  PROVENTIL Take 2.5 mg by nebulization every 6 (six) hours as needed.   ALPRAZolam 0.25 MG tablet Commonly known  as:  XANAX Take 0.25-0.375 mg by mouth daily as needed for anxiety.   aspirin 81 MG chewable tablet Chew 81 mg by mouth daily.   benzonatate 200 MG capsule Commonly known as:  TESSALON Take 200 mg by mouth 3 (three) times daily as needed. For cough.   calcium carbonate 420 MG Chew chewable tablet Commonly known as:  TITRALAC Chew 420 mg by mouth daily as needed.   docusate sodium 100 MG capsule Commonly known as:   COLACE Take 100 mg by mouth 2 (two) times daily.   famotidine 20 MG tablet Commonly known as:  PEPCID Take 1 tablet (20 mg total) by mouth 2 (two) times daily.   Fluticasone-Salmeterol 250-50 MCG/DOSE Aepb Commonly known as:  ADVAIR Inhale 1 puff into the lungs every 12 (twelve) hours.   HYDROcodone-acetaminophen 5-325 MG tablet Commonly known as:  NORCO/VICODIN Take 1-2 tablets by mouth every 6 (six) hours as needed for moderate pain or severe pain.   lisinopril-hydrochlorothiazide 20-12.5 MG tablet Commonly known as:  PRINZIDE,ZESTORETIC Take 0.5 tablets by mouth daily. Start taking on:  09/21/2016   loratadine 10 MG tablet Commonly known as:  CLARITIN Take 10 mg by mouth daily.   ondansetron 4 MG tablet Commonly known as:  ZOFRAN Take 4 mg by mouth every 8 (eight) hours as needed.   rosuvastatin 20 MG tablet Commonly known as:  CRESTOR Take 20 mg by mouth daily.   SPIRIVA HANDIHALER 18 MCG inhalation capsule Generic drug:  tiotropium Place 18 mcg into inhaler and inhale daily.   zolpidem 10 MG tablet Commonly known as:  AMBIEN Take 10 mg by mouth at bedtime as needed for sleep.      Follow-up Information    Reyes Ivan, MD. Schedule an appointment as soon as possible for a visit in 3 week(s).   Specialty:  General Surgery Contact information: [address] [place] [address] [phone]        Drake Leach, MD. Schedule an appointment as soon as possible for a visit in 1 week(s).   Specialty:  Internal Medicine Contact information: [address] [place] [address] [phone]          Allergies  Allergen Reactions  . Fig Extract [Ficus] Anaphylaxis  . Sulfa Antibiotics Other (See Comments)    Severe nervousness.    Consultations:   general surgery   Other Procedures/Studies: US Abdomen Complete  Result Date: 09/16/2016 CLINICAL DATA:  Zero severe right upper quadrant pain associated with nausea for the  past 2 days. History of gallstones, hyperlipidemia, lung malignancy. EXAM: ABDOMEN ULTRASOUND COMPLETE COMPARISON:  Abdominal and pelvic CT scan of April 28, 2012. FINDINGS: The patient was unable to cooperate with deep breathing maneuvers which limits the study. Gallbladder: The gallbladder is adequately distended. There are at least 2 echogenic mobile shadowing stones measuring up to 1.6 cm in diameter. Sludge is present as well. The gallbladder wall is top normal at 3 mm. There is a positive sonographic Murphy's sign reported by the technologist. Common bile duct: Diameter: 6 mm where visualized. Liver: The hepatic echotexture is normal. There is no discrete mass or ductal dilation. IVC: Bowel gas limits evaluation of the IVC. Bowel gas limits evaluation of the pancreas. Pancreas: Visualized portion unremarkable. Spleen: Size and appearance within normal limits. Right Kidney: Length: 10.4 cm. In the upper pole of the right kidney there is a cystic structure with a few internal echoes measuring 4.4 cm in diameter. A cyst was  previously demonstrated on the 2013 CT scan. Left Kidney: Length: 11 cm. The echotexture is normal. There is a prominent column of Bertin. Abdominal aorta: Normal where visualized. Other findings: There is no ascites. IMPRESSION: Gallstones. Positive sonographic Murphy's sign and top-normal gallbladder wall thickness may reflect acute cholecystitis. Non-simple cystic structure in the upper pole of the right kidney. This has increased slightly in size since the CT scan of September 2013. Electronically Signed   By: David  Martinique M.D.   On: 09/16/2016 08:52   Dg Chest Port 1 View  Result Date: 09/16/2016 CLINICAL DATA:  Preoperative examination. History of hypertension, aortic valve replacement, previous MI, partial left lower lobectomy. EXAM: PORTABLE CHEST 1 VIEW COMPARISON:  Chest x-ray of September 16, 2016 FINDINGS: The lungs are adequately inflated. The cardiac apex is obscured on the  current exam but was normal on the earlier study. The heart is normal in size. There is a prosthetic aortic valve. The sternal wires are intact. There is calcification in the wall of the aortic arch. The pulmonary vascularity is not clearly engorged. IMPRESSION: New obscuration of the left heart border since this morning's acute abdominal series may reflect overlap of normal structures but acute lingular atelectasis is not excluded. Correlation with the patient's clinical and laboratory values is needed. No evidence of pulmonary edema. Thoracic aortic atherosclerosis. Electronically Signed   By: David  Martinique M.D.   On: 09/16/2016 17:12   Dg Abd Acute W/chest  Result Date: 09/16/2016 CLINICAL DATA:  Initial evaluation for periodic right upper and left upper quadrant pain, right flank pain, and right lower quadrant pain. EXAM: DG ABDOMEN ACUTE W/ 1V CHEST COMPARISON:  Prior radiograph from 04/28/2012. FINDINGS: Median sternotomy wires underlying surgical clips and prostatic valve noted. Mild cardiomegaly is stable. Mediastinal silhouette within normal limits. Aortic atherosclerosis noted. Lungs normally inflated. Chronic coarsening of the interstitial markings. Blunting of left costophrenic angle felt to most likely reflect chronic pleural reaction/ scarring. No definite pleural effusion. No pulmonary edema. No focal infiltrates. No pneumothorax. Bowel gas pattern within normal limits without evidence for obstruction or ileus. No free air. No abnormal bowel wall thickening. Moderate amount of retained stool throughout the colon. No soft tissue mass or abnormal calcifications. Multilevel degenerate spondylolysis noted within the visualized spine. Osteoarthritic changes present about the hips. Osteopenia. No acute osseous abnormality. IMPRESSION: 1. Nonobstructive bowel gas pattern with no radiographic evidence for acute intra-abdominal process. 2. Moderate amount of retained stool throughout the colon, suggesting  constipation. 3. Blunting of the left costophrenic angle, suspected to likely reflect chronic pleural reaction/scarring. No other active cardiopulmonary disease identified. Electronically Signed   By: Jeannine Boga M.D.   On: 09/16/2016 05:56     TODAY-DAY OF DISCHARGE:  Subjective:   Tracy Buck today has no headache,no chest,no new weakness tingling or numbness, feels much better wants to go home today.   Objective:   Blood pressure (!) 115/48, pulse 80, temperature 98 F (36.7 C), temperature source Oral, resp. rate 18, height '5\' 4"'$  (1.626 m), weight 77.1 kg (170 lb), SpO2 95 %.  Intake/Output Summary (Last 24 hours) at 09/19/16 0923 Last data filed at 09/19/16 0643  Gross per 24 hour  Intake              420 ml  Output             1775 ml  Net            -1355 ml   [employer]  09/16/16 0434  Weight: 77.1 kg (170 lb)    Exam: Awake Alert, Oriented *3, No new F.N deficits, Normal affect [place].AT,PERRAL Supple Neck,No JVD, No cervical lymphadenopathy appriciated.  Symmetrical Chest wall movement, Good air movement bilaterally, CTAB RRR,No Gallops,Rubs or new Murmurs, No Parasternal Heave +ve B.Sounds, Abd Soft, minimally tender at operative site, No organomegaly appriciated, No rebound -guarding or rigidity. No Cyanosis, Clubbing or edema, No new Rash or bruise   PERTINENT RADIOLOGIC STUDIES: US Abdomen Complete  Result Date: 09/16/2016 CLINICAL DATA:  Zero severe right upper quadrant pain associated with nausea for the past 2 days. History of gallstones, hyperlipidemia, lung malignancy. EXAM: ABDOMEN ULTRASOUND COMPLETE COMPARISON:  Abdominal and pelvic CT scan of April 28, 2012. FINDINGS: The patient was unable to cooperate with deep breathing maneuvers which limits the study. Gallbladder: The gallbladder is adequately distended. There are at least 2 echogenic mobile shadowing stones measuring up to 1.6 cm in diameter. Sludge is present as well. The gallbladder  wall is top normal at 3 mm. There is a positive sonographic Murphy's sign reported by the technologist. Common bile duct: Diameter: 6 mm where visualized. Liver: The hepatic echotexture is normal. There is no discrete mass or ductal dilation. IVC: Bowel gas limits evaluation of the IVC. Bowel gas limits evaluation of the pancreas. Pancreas: Visualized portion unremarkable. Spleen: Size and appearance within normal limits. Right Kidney: Length: 10.4 cm. In the upper pole of the right kidney there is a cystic structure with a few internal echoes measuring 4.4 cm in diameter. A cyst was previously demonstrated on the 2013 CT scan. Left Kidney: Length: 11 cm. The echotexture is normal. There is a prominent column of Bertin. Abdominal aorta: Normal where visualized. Other findings: There is no ascites. IMPRESSION: Gallstones. Positive sonographic Murphy's sign and top-normal gallbladder wall thickness may reflect acute cholecystitis. Non-simple cystic structure in the upper pole of the right kidney. This has increased slightly in size since the CT scan of September 2013. Electronically Signed   By: David  Martinique M.D.   On: 09/16/2016 08:52   Dg Chest Port 1 View  Result Date: 09/16/2016 CLINICAL DATA:  Preoperative examination. History of hypertension, aortic valve replacement, previous MI, partial left lower lobectomy. EXAM: PORTABLE CHEST 1 VIEW COMPARISON:  Chest x-ray of September 16, 2016 FINDINGS: The lungs are adequately inflated. The cardiac apex is obscured on the current exam but was normal on the earlier study. The heart is normal in size. There is a prosthetic aortic valve. The sternal wires are intact. There is calcification in the wall of the aortic arch. The pulmonary vascularity is not clearly engorged. IMPRESSION: New obscuration of the left heart border since this morning's acute abdominal series may reflect overlap of normal structures but acute lingular atelectasis is not excluded. Correlation with  the patient's clinical and laboratory values is needed. No evidence of pulmonary edema. Thoracic aortic atherosclerosis. Electronically Signed   By: David  Martinique M.D.   On: 09/16/2016 17:12   Dg Abd Acute W/chest  Result Date: 09/16/2016 CLINICAL DATA:  Initial evaluation for periodic right upper and left upper quadrant pain, right flank pain, and right lower quadrant pain. EXAM: DG ABDOMEN ACUTE W/ 1V CHEST COMPARISON:  Prior radiograph from 04/28/2012. FINDINGS: Median sternotomy wires underlying surgical clips and prostatic valve noted. Mild cardiomegaly is stable. Mediastinal silhouette within normal limits. Aortic atherosclerosis noted. Lungs normally inflated. Chronic coarsening of the interstitial markings. Blunting of left costophrenic angle felt to most likely reflect chronic pleural  reaction/ scarring. No definite pleural effusion. No pulmonary edema. No focal infiltrates. No pneumothorax. Bowel gas pattern within normal limits without evidence for obstruction or ileus. No free air. No abnormal bowel wall thickening. Moderate amount of retained stool throughout the colon. No soft tissue mass or abnormal calcifications. Multilevel degenerate spondylolysis noted within the visualized spine. Osteoarthritic changes present about the hips. Osteopenia. No acute osseous abnormality. IMPRESSION: 1. Nonobstructive bowel gas pattern with no radiographic evidence for acute intra-abdominal process. 2. Moderate amount of retained stool throughout the colon, suggesting constipation. 3. Blunting of the left costophrenic angle, suspected to likely reflect chronic pleural reaction/scarring. No other active cardiopulmonary disease identified. Electronically Signed   By: Jeannine Boga M.D.   On: 09/16/2016 05:56     PERTINENT LAB RESULTS: CBC:  Recent Labs  09/16/16 1655 09/17/16 0521  WBC 15.4* 17.9*  HGB 13.8 12.7  HCT 40.4 37.6  PLT 175 166   CMET CMP     Component Value Date/Time   NA 133  (L) 09/17/2016 0521   K 3.7 09/17/2016 0521   CL 104 09/17/2016 0521   CO2 24 09/17/2016 0521   GLUCOSE 141 (H) 09/17/2016 0521   BUN 12 09/17/2016 0521   CREATININE 1.05 (H) 09/17/2016 0521   CALCIUM 8.3 (L) 09/17/2016 0521   PROT 6.9 09/16/2016 0440   ALBUMIN 4.1 09/16/2016 0440   AST 29 09/16/2016 0440   ALT 24 09/16/2016 0440   ALKPHOS 76 09/16/2016 0440   BILITOT 1.0 09/16/2016 0440   GFRNONAA 49 (L) 09/17/2016 0521   GFRAA 57 (L) 09/17/2016 0521    GFR Estimated Creatinine Clearance: 43.7 mL/min (by C-G formula based on SCr of 1.05 mg/dL (H)). No results for input(s): LIPASE, AMYLASE in the last 72 hours.  Recent Labs  09/16/16 1655  TROPONINI 0.05*   Invalid input(s): POCBNP No results for input(s): DDIMER in the last 72 hours. No results for input(s): HGBA1C in the last 72 hours. No results for input(s): CHOL, HDL, LDLCALC, TRIG, CHOLHDL, LDLDIRECT in the last 72 hours. No results for input(s): TSH, T4TOTAL, T3FREE, THYROIDAB in the last 72 hours.  Invalid input(s): FREET3 No results for input(s): VITAMINB12, FOLATE, FERRITIN, TIBC, IRON, RETICCTPCT in the last 72 hours. Coags:  Recent Labs  09/16/16 1655  INR 1.07   Microbiology: Recent Results (from the past 240 hour(s))  MRSA PCR Screening     Status: None   Collection Time: 09/17/16 10:36 AM  Result Value Ref Range Status   MRSA by PCR NEGATIVE NEGATIVE Final    Comment:        The GeneXpert MRSA Assay (FDA approved for NASAL specimens only), is one component of a comprehensive MRSA colonization surveillance program. It is not intended to diagnose MRSA infection nor to guide or monitor treatment for MRSA infections.     FURTHER DISCHARGE INSTRUCTIONS:  Get Medicines reviewed and adjusted: Please take all your medications with you for your next visit with your Primary MD  Laboratory/radiological data: Please request your Primary MD to go over all hospital tests and procedure/radiological  results at the follow up, please ask your Primary MD to get all Hospital records sent to his/her office.  In some cases, they will be blood work, cultures and biopsy results pending at the time of your discharge. Please request that your primary care M.D. goes through all the records of your hospital data and follows up on these results.  Also Note the following: If you experience worsening of your  admission symptoms, develop shortness of breath, life threatening emergency, suicidal or homicidal thoughts you must seek medical attention immediately by calling 911 or calling your MD immediately  if symptoms less severe.  You must read complete instructions/literature along with all the possible adverse reactions/side effects for all the Medicines you take and that have been prescribed to you. Take any new Medicines after you have completely understood and accpet all the possible adverse reactions/side effects.   Do not drive when taking Pain medications or sleeping medications (Benzodaizepines)  Do not take more than prescribed Pain, Sleep and Anxiety Medications. It is not advisable to combine anxiety,sleep and pain medications without talking with your primary care practitioner  Special Instructions: If you have smoked or chewed Tobacco  in the last 2 yrs please stop smoking, stop any regular Alcohol  and or any Recreational drug use.  Wear Seat belts while driving.  Please note: You were cared for by a hospitalist during your hospital stay. Once you are discharged, your primary care physician will handle any further medical issues. Please note that NO REFILLS for any discharge medications will be authorized once you are discharged, as it is imperative that you return to your primary care physician (or establish a relationship with a primary care physician if you do not have one) for your post hospital discharge needs so that they can reassess your need for medications and monitor your lab  values.  Total Time spent coordinating discharge including counseling, education and face to face time equals  45 minutes.  SignedOren Binet 09/19/2016 9:23 AM

## 2016-09-19 NOTE — Discharge Instructions (Signed)
[facility] ______[facility] °LAPAROSCOPIC SURGERY: POST OP INSTRUCTIONS °Always review your discharge instruction sheet given to you by the facility where your surgery was performed. °IF YOU HAVE DISABILITY OR FAMILY LEAVE FORMS, YOU MUST BRING THEM TO THE OFFICE FOR PROCESSING.   °DO NOT GIVE THEM TO YOUR DOCTOR. ° °1. A prescription for pain medication may be given to you upon discharge.  Take your pain medication as prescribed, if needed.  If narcotic pain medicine is not needed, then you may take acetaminophen (Tylenol) or ibuprofen (Advil) as needed. °2. Take your usually prescribed medications unless otherwise directed. °3. If you need a refill on your pain medication, please contact your pharmacy.  They will contact our office to request authorization. Prescriptions will not be filled after 5pm or on week-ends. °4. You should follow a light diet the first few days after arrival home, such as soup and crackers, etc.  Be sure to include lots of fluids daily. °5. Most patients will experience some swelling and bruising in the area of the incisions.  Ice packs will help.  Swelling and bruising can take several days to resolve.  °6. It is common to experience some constipation if taking pain medication after surgery.  Increasing fluid intake and taking a stool softener (such as Colace) will usually help or prevent this problem from occurring.  A mild laxative (Milk of Magnesia or Miralax) should be taken according to package instructions if there are no bowel movements after 48 hours. °7. Unless discharge instructions indicate otherwise, you may remove your bandages 24-48 hours after surgery, and you may shower at that time.  You may have steri-strips (small skin tapes) in place directly over the incision.  These strips should be left on the skin for 7-10 days.  If your surgeon used skin glue on the incision, you may shower in 24 hours.  The glue will flake off over the next 2-3 weeks.  Any sutures or  staples will be removed at the office during your follow-up visit. °8. ACTIVITIES:  You may resume regular (light) daily activities beginning the next day--such as daily self-care, walking, climbing stairs--gradually increasing activities as tolerated.  You may have sexual intercourse when it is comfortable.  Refrain from any heavy lifting or straining until approved by your doctor. °a. You may drive when you are no longer taking prescription pain medication, you can comfortably wear a seatbelt, and you can safely maneuver your car and apply brakes. °b. RETURN TO WORK:  __________________________________________________________ °9. You should see your doctor in the office for a follow-up appointment approximately 2-3 weeks after your surgery.  Make sure that you call for this appointment within a day or two after you arrive home to insure a convenient appointment time. °10. OTHER INSTRUCTIONS: __________________________________________________________________________________________________________________________ __________________________________________________________________________________________________________________________ °WHEN TO CALL YOUR DOCTOR: °1. Fever over 101.0 °2. Inability to urinate °3. Continued bleeding from incision. °4. Increased pain, redness, or drainage from the incision. °5. Increasing abdominal pain ° °The clinic staff is available to answer your questions during regular business hours.  Please don’t hesitate to call and ask to speak to one of the nurses for clinical concerns.  If you have a medical emergency, go to the nearest emergency room or call 911.  A surgeon from [facility] is always on call at the hospital. °[address], [address], [place], [place]  [address] ? [address] [address], [place], [place]   [address] °[phone] ? [phone] ? FAX [phone] °Web site:   [email or web address] °

## 2016-09-19 NOTE — Progress Notes (Signed)
Pt discharged to home.  Discharge instructions explained to pt.  Pt has no questions at the time of discharge.  Pt states she has all belongings.  IV removed.  Pt taken off unit in wheelchair by staff.

## 2016-09-19 NOTE — Progress Notes (Signed)
2 Days Post-Op  Subjective: Feeling better and wants to go home. Tolerating diet.  No difficulty swallowing.  Reflux has resolved.  Ambulating more. She has arranged to stay with one of her daughters Faythe Ghee for discharge today  Objective: Vital signs in last 24 hours: Temp:  [98 F (36.7 C)-98.3 F (36.8 C)] 98 F (36.7 C) (01/28 0400) Pulse Rate:  [78-80] 80 (01/28 0400) Resp:  [18] 18 (01/28 0400) BP: (102-116)/(48-63) 115/48 (01/28 0400) SpO2:  [95 %-99 %] 95 % (01/28 0400) Last BM Date: 09/16/16  Intake/Output from previous day: 01/27 0701 - 01/28 0700 In: 540 [P.O.:440; IV Piggyback:100] Out: 1775 [Urine:1775] Intake/Output this shift: No intake/output data recorded.  General appearance: Alert.  Very pleasant.  No distress.  Mental status normal. Resp: clear to auscultation bilaterally GI: Soft.  Borderline distended.  Active bowel sounds.  No significant tenderness.  Wounds clean. Extremities: no edema, redness or tenderness in the calves or thighs  Lab Results:  Results for orders placed or performed during the hospital encounter of 09/16/16 (from the past 24 hour(s))  Glucose, capillary     Status: Abnormal   Collection Time: 09/18/16 11:44 AM  Result Value Ref Range   Glucose-Capillary 155 (H) 65 - 99 mg/dL   Comment 1 Notify RN   Glucose, capillary     Status: Abnormal   Collection Time: 09/18/16  4:30 PM  Result Value Ref Range   Glucose-Capillary 121 (H) 65 - 99 mg/dL   Comment 1 Notify RN   Glucose, capillary     Status: Abnormal   Collection Time: 09/18/16  9:32 PM  Result Value Ref Range   Glucose-Capillary 102 (H) 65 - 99 mg/dL     Studies/Results: No results found.  . calcium carbonate  400 mg of elemental calcium Oral TID  . enoxaparin (LOVENOX) injection  40 mg Subcutaneous Daily  . mometasone-formoterol  2 puff Inhalation BID  . pantoprazole  40 mg Oral BID  . piperacillin-tazobactam (ZOSYN)  IV  3.375 g Intravenous Q8H  . polyethylene  glycol  17 g Oral Daily  . sodium chloride flush  3 mL Intravenous Q12H  . tiotropium  18 mcg Inhalation Daily     Assessment/Plan: s/p Procedure(s): LAPAROSCOPIC CHOLECYSTECTOMY   POD #2.  Laparoscopic cholecystectomy for acute supperative  cholecystitis Doing well surgically. She meets discharge criteria and may be discharged home today We will sign off. Discharge instructions loaded into AVS. Recommend follow-up appointment with Dr. Ralene Ok in 3 weeks Norco 5-325 prescription recommended, use sparingly  GERD symptoms.   resolved  History CABG and bovine aVR. History left lower lobectomy for lung cancer 2013 COPD-per internal medicine HTN - currently normotensive.  Per internal medicine VTE prophylaxis - on lovenox  '@PROBHOSP'$ @  LOS: 3 days    Socorro Kanitz M 09/19/2016  . .prob

## 2016-12-04 ENCOUNTER — Emergency Department (HOSPITAL_BASED_OUTPATIENT_CLINIC_OR_DEPARTMENT_OTHER)
Admission: EM | Admit: 2016-12-04 | Discharge: 2016-12-04 | Disposition: A | Discharge status: Home / Self Care | Payer: Medicare Other | Attending: Emergency Medicine | Admitting: Emergency Medicine

## 2016-12-04 ENCOUNTER — Encounter (HOSPITAL_BASED_OUTPATIENT_CLINIC_OR_DEPARTMENT_OTHER): Payer: Self-pay | Admitting: *Deleted

## 2016-12-04 DIAGNOSIS — Z7982 Long term (current) use of aspirin: Secondary | ICD-10-CM | POA: Diagnosis not present

## 2016-12-04 DIAGNOSIS — I10 Essential (primary) hypertension: Secondary | ICD-10-CM | POA: Diagnosis not present

## 2016-12-04 DIAGNOSIS — J449 Chronic obstructive pulmonary disease, unspecified: Secondary | ICD-10-CM | POA: Diagnosis not present

## 2016-12-04 DIAGNOSIS — Z85118 Personal history of other malignant neoplasm of bronchus and lung: Secondary | ICD-10-CM | POA: Diagnosis not present

## 2016-12-04 DIAGNOSIS — R300 Dysuria: Secondary | ICD-10-CM | POA: Diagnosis present

## 2016-12-04 DIAGNOSIS — Z79899 Other long term (current) drug therapy: Secondary | ICD-10-CM | POA: Diagnosis not present

## 2016-12-04 DIAGNOSIS — N3000 Acute cystitis without hematuria: Secondary | ICD-10-CM | POA: Insufficient documentation

## 2016-12-04 LAB — URINALYSIS, MICROSCOPIC (REFLEX)

## 2016-12-04 LAB — URINALYSIS, ROUTINE W REFLEX MICROSCOPIC
BILIRUBIN URINE: NEGATIVE
Glucose, UA: NEGATIVE mg/dL
Ketones, ur: NEGATIVE mg/dL
NITRITE: NEGATIVE
PH: 6 (ref 5.0–8.0)
Protein, ur: NEGATIVE mg/dL
SPECIFIC GRAVITY, URINE: 1.017 (ref 1.005–1.030)

## 2016-12-04 MED ORDER — CEPHALEXIN 250 MG PO CAPS
500.0000 mg | ORAL_CAPSULE | Freq: Once | ORAL | Status: AC
  Administered 2016-12-04: 500 mg via ORAL
  Filled 2016-12-04: qty 2

## 2016-12-04 MED ORDER — CEPHALEXIN 500 MG PO CAPS: 500.0000 mg | ORAL_CAPSULE | Freq: Two times a day (BID) | ORAL | 0 refills | Status: AC

## 2016-12-04 MED ORDER — PHENAZOPYRIDINE HCL 200 MG PO TABS: 200.0000 mg | ORAL_TABLET | Freq: Three times a day (TID) | ORAL | 0 refills | Status: DC

## 2016-12-04 NOTE — Discharge Instructions (Signed)
Follow up with your doctor to in about a week or so to make sure the infection has cleared, sooner for trouble with fever, vomiting

## 2016-12-04 NOTE — ED Provider Notes (Signed)
[place] DEPT [facility] Provider Note   CSN: [id number] Arrival date & time: 12/04/16  1236  By signing my name below, I, Jeanell Sparrow, attest that this documentation has been prepared under the direction and in the presence of Dorie Rank, MD. Electronically Signed: Jeanell Sparrow, Scribe. 12/04/2016. 3:36 PM.  History   Chief Complaint Chief Complaint  Patient presents with  . Urinary Tract Infection   The history is provided by the patient. No language interpreter was used.   HPI Comments: Tracy Buck is a 82 y.o. female who presents to the Emergency Department complaining of intermittent dysuria that started about 3 days ago. She states she suspects a UTI. No modifying factors. She reports associated urinary frequency (every 20 minutes).Denies any fever, vomiting, back pain, or other complaints at this time.     PCP: Drake Leach, MD  Past Medical History:  Diagnosis Date  . Arthritis    "fingers, knees" (09/16/2016)  . Complication of anesthesia    "I often go into severe panic attacks when they try to bring me out and they have to put me back under; sometimes happens when they are starting the anesthesia also" (09/16/2016)  . COPD (chronic obstructive pulmonary disease) ([place])    "I think this has been recently dx'd" (09/16/2016)  . Heart valve replaced   . Hyperlipidemia   . Hypertension   . Lung cancer ([place]) 2013  . Lung mass   . Myocardial infarct ([place])   . Panic attack   . Pneumonia    "when I was very young" (09/16/2016)  . Rib fracture   . Skin cancer of nose     Patient Active Problem List   Diagnosis Date Noted  . Cholecystitis 09/16/2016  . S/P AVR 09/16/2016  . S/P CABG (coronary artery bypass graft) 09/16/2016  . Essential hypertension 09/16/2016  . COPD (chronic obstructive pulmonary disease) ([place]) 09/16/2016  . Hyperglycemia 09/16/2016  . Pre-op evaluation   . Hyperlipidemia     Past Surgical History:  Procedure Laterality Date  . AORTIC VALVE  REPLACEMENT     "bovine valve"  . BYPASS GRAFT     does not know if she had CABG (09/16/2016)  . CARDIAC SURGERY     does not know if she had CABG (09/16/2016)  . CARDIAC VALVE REPLACEMENT    . CARPAL TUNNEL RELEASE Right 1959  . CHOLECYSTECTOMY N/A 09/17/2016   Procedure: LAPAROSCOPIC CHOLECYSTECTOMY;  Surgeon: Ralene Ok, MD;  Location: [facility];  Service: General;  Laterality: N/A;  . [place]  . KNEE ARTHROSCOPY Left 09/2014  . LOBECTOMY Left 2013   "took bottom lobe off"  . SKIN CANCER EXCISION     "tip of my nose"  . TONSILLECTOMY      OB History    No data available       Home Medications    Prior to Admission medications   Medication Sig Start Date End Date Taking? Authorizing Provider  acetaminophen (TYLENOL) 500 MG tablet Take 500 mg by mouth every 6 (six) hours as needed. For pain.    Historical Provider, MD  albuterol (PROVENTIL HFA;VENTOLIN HFA) 108 (90 BASE) MCG/ACT inhaler Inhale 2 puffs into the lungs every 6 (six) hours as needed. Patient uses 2 puffs of this medication at 6 am.    Historical Provider, MD  albuterol (PROVENTIL) (2.5 MG/3ML) 0.083% nebulizer solution Take 2.5 mg by nebulization every 6 (six) hours as needed.    Historical Provider, MD  ALPRAZolam (XANAX) 0.25 MG tablet Take 0.25-0.375 mg by mouth daily as needed for anxiety. 08/20/16   Historical Provider, MD  aspirin 81 MG chewable tablet Chew 81 mg by mouth daily.    Historical Provider, MD  benzonatate (TESSALON) 200 MG capsule Take 200 mg by mouth 3 (three) times daily as needed. For cough.    Historical Provider, MD  calcium carbonate (TITRALAC) 420 MG CHEW Chew 420 mg by mouth daily as needed.    Historical Provider, MD  cephALEXin (KEFLEX) 500 MG capsule Take 1 capsule (500 mg total) by mouth 2 (two) times daily. 12/04/16 12/09/16  Dorie Rank, MD  docusate sodium (COLACE) 100 MG capsule Take 100 mg by mouth 2 (two) times daily.    Historical Provider, MD    famotidine (PEPCID) 20 MG tablet Take 1 tablet (20 mg total) by mouth 2 (two) times daily. Patient not taking: Reported on 09/16/2016 04/28/12 09/16/16  Rhunette Croft, MD  Fluticasone-Salmeterol (ADVAIR) 250-50 MCG/DOSE AEPB Inhale 1 puff into the lungs every 12 (twelve) hours.    Historical Provider, MD  HYDROcodone-acetaminophen (NORCO/VICODIN) 5-325 MG tablet Take 1-2 tablets by mouth every 6 (six) hours as needed for moderate pain or severe pain. 09/19/16   Shanker Kristeen Mans, MD  lisinopril-hydrochlorothiazide (PRINZIDE,ZESTORETIC) 20-12.5 MG tablet Take 0.5 tablets by mouth daily. 09/21/16   Shanker Kristeen Mans, MD  loratadine (CLARITIN) 10 MG tablet Take 10 mg by mouth daily.    Historical Provider, MD  ondansetron (ZOFRAN) 4 MG tablet Take 4 mg by mouth every 8 (eight) hours as needed.    Historical Provider, MD  phenazopyridine (PYRIDIUM) 200 MG tablet Take 1 tablet (200 mg total) by mouth 3 (three) times daily. 12/04/16   Dorie Rank, MD  rosuvastatin (CRESTOR) 20 MG tablet Take 20 mg by mouth daily.    Historical Provider, MD  tiotropium (SPIRIVA HANDIHALER) 18 MCG inhalation capsule Place 18 mcg into inhaler and inhale daily.    Historical Provider, MD  zolpidem (AMBIEN) 10 MG tablet Take 10 mg by mouth at bedtime as needed for sleep.    Historical Provider, MD    Family History Family History  Problem Relation Age of Onset  . Transient ischemic attack Mother   . Colon cancer Father     Social History Social History  Substance Use Topics  . Smoking status: Never Smoker  . Smokeless tobacco: Never Used  . Alcohol use Yes     Comment: 09/16/2016 "2-3 glasses of wine when at the beach; once/year"     Allergies   Fig extract [ficus] and Sulfa antibiotics   Review of Systems Review of Systems  Constitutional: Negative for fever.  Genitourinary: Positive for dysuria.  Musculoskeletal: Negative for back pain.     Physical Exam Updated Vital Signs BP 122/72 (BP Location: Right  Arm)   Pulse (!) 55   Temp 97.6 F (36.4 C) (Oral)   Resp 16   Ht 5' 3.5" (1.613 m)   Wt 170 lb (77.1 kg)   SpO2 99%   BMI 29.64 kg/m   Physical Exam  Constitutional: She appears well-developed and well-nourished. No distress.  HENT:  Head: Normocephalic and atraumatic.  Right Ear: External ear normal.  Left Ear: External ear normal.  Eyes: Conjunctivae are normal. Right eye exhibits no discharge. Left eye exhibits no discharge. No scleral icterus.  Neck: Neck supple. No tracheal deviation present.  Cardiovascular: Normal rate.   Pulmonary/Chest: Effort normal. No stridor. No respiratory distress.  Abdominal: Soft. She  exhibits no distension. There is no tenderness. There is no guarding.  Musculoskeletal: She exhibits no edema.  Neurological: She is alert. Cranial nerve deficit: no gross deficits.  Skin: Skin is warm and dry. No rash noted.  Psychiatric: She has a normal mood and affect.  Nursing note and vitals reviewed.    ED Treatments / Results  DIAGNOSTIC STUDIES: Oxygen Saturation is 99% on RA, normal by my interpretation.    COORDINATION OF CARE: 3:40 PM- Pt advised of plan for treatment and pt agrees.  Labs (all labs ordered are listed, but only abnormal results are displayed) Labs Reviewed  URINALYSIS, ROUTINE W REFLEX MICROSCOPIC - Abnormal; Notable for the following:       Result Value   APPearance TURBID (*)    Hgb urine dipstick MODERATE (*)    Leukocytes, UA LARGE (*)    All other components within normal limits  URINALYSIS, MICROSCOPIC (REFLEX) - Abnormal; Notable for the following:    Bacteria, UA FEW (*)    Squamous Epithelial / LPF 0-5 (*)    All other components within normal limits  URINE CULTURE     Procedures Procedures (including critical care time)  Medications Ordered in ED Medications  cephALEXin (KEFLEX) capsule 500 mg (not administered)     Initial Impression / Assessment and Plan / ED Course  I have reviewed the triage vital  signs and the nursing notes.  Pertinent labs & imaging results that were available during my care of the patient were reviewed by me and considered in my medical decision making (see chart for details).   Sx and UA are consistent with a UTI.  No fever or abdominal pain.  No signs of complicated infection.  Final Clinical Impressions(s) / ED Diagnoses   Final diagnoses:  Acute cystitis without hematuria    New Prescriptions New Prescriptions   CEPHALEXIN (KEFLEX) 500 MG CAPSULE    Take 1 capsule (500 mg total) by mouth 2 (two) times daily.   PHENAZOPYRIDINE (PYRIDIUM) 200 MG TABLET    Take 1 tablet (200 mg total) by mouth 3 (three) times daily.   I personally performed the services described in this documentation, which was scribed in my presence.  The recorded information has been reviewed and is accurate.    Dorie Rank, MD 12/04/16 [id number]

## 2016-12-04 NOTE — ED Triage Notes (Signed)
Urinary frequency dysuria x 3-4 days.  Denies fever

## 2016-12-04 NOTE — ED Notes (Signed)
Urgency to urinate and discomfort during urination.

## 2016-12-07 LAB — URINE CULTURE: Culture: 30000 — AB

## 2016-12-08 ENCOUNTER — Telehealth: Payer: Self-pay | Admitting: Emergency Medicine

## 2016-12-08 NOTE — Telephone Encounter (Signed)
Post ED Visit - Positive Culture Follow-up  Culture report reviewed by antimicrobial stewardship pharmacist:  '[]'$  Elenor Quinones, Pharm.D. '[]'$  Heide Guile, Pharm.D., BCPS AQ-ID '[]'$  Parks Neptune, Pharm.D., BCPS '[]'$  Alycia Rossetti, Pharm.D., BCPS '[]'$  [place], [place].D., BCPS, AAHIVP '[]'$  Legrand Como, Pharm.D., BCPS, AAHIVP '[]'$  Salome Arnt, PharmD, BCPS '[]'$  Dimitri Ped, PharmD, BCPS '[]'$  Vincenza Hews, PharmD, BCPS Maylon Cos PharmD  Positive urine culture Treated with cephalexin, organism sensitive to the same and no further patient follow-up is required at this time.  Hazle Nordmann 12/08/2016, 2:12 PM

## 2017-08-07 ENCOUNTER — Emergency Department (HOSPITAL_BASED_OUTPATIENT_CLINIC_OR_DEPARTMENT_OTHER): Payer: Medicare Other

## 2017-08-07 ENCOUNTER — Other Ambulatory Visit: Payer: Self-pay

## 2017-08-07 ENCOUNTER — Emergency Department (HOSPITAL_BASED_OUTPATIENT_CLINIC_OR_DEPARTMENT_OTHER)
Admission: EM | Admit: 2017-08-07 | Discharge: 2017-08-07 | Disposition: A | Discharge status: Home / Self Care | Payer: Medicare Other | Attending: Emergency Medicine | Admitting: Emergency Medicine

## 2017-08-07 ENCOUNTER — Encounter (HOSPITAL_BASED_OUTPATIENT_CLINIC_OR_DEPARTMENT_OTHER): Payer: Self-pay | Admitting: Emergency Medicine

## 2017-08-07 DIAGNOSIS — I10 Essential (primary) hypertension: Secondary | ICD-10-CM | POA: Insufficient documentation

## 2017-08-07 DIAGNOSIS — W19XXXA Unspecified fall, initial encounter: Secondary | ICD-10-CM

## 2017-08-07 DIAGNOSIS — J449 Chronic obstructive pulmonary disease, unspecified: Secondary | ICD-10-CM | POA: Diagnosis not present

## 2017-08-07 DIAGNOSIS — Z7982 Long term (current) use of aspirin: Secondary | ICD-10-CM | POA: Insufficient documentation

## 2017-08-07 DIAGNOSIS — S92324A Nondisplaced fracture of second metatarsal bone, right foot, initial encounter for closed fracture: Secondary | ICD-10-CM | POA: Insufficient documentation

## 2017-08-07 DIAGNOSIS — Z79899 Other long term (current) drug therapy: Secondary | ICD-10-CM | POA: Diagnosis not present

## 2017-08-07 DIAGNOSIS — Y929 Unspecified place or not applicable: Secondary | ICD-10-CM | POA: Diagnosis not present

## 2017-08-07 DIAGNOSIS — Z85828 Personal history of other malignant neoplasm of skin: Secondary | ICD-10-CM | POA: Insufficient documentation

## 2017-08-07 DIAGNOSIS — Y9301 Activity, walking, marching and hiking: Secondary | ICD-10-CM | POA: Diagnosis not present

## 2017-08-07 DIAGNOSIS — Z952 Presence of prosthetic heart valve: Secondary | ICD-10-CM | POA: Diagnosis not present

## 2017-08-07 DIAGNOSIS — I252 Old myocardial infarction: Secondary | ICD-10-CM | POA: Insufficient documentation

## 2017-08-07 DIAGNOSIS — W109XXA Fall (on) (from) unspecified stairs and steps, initial encounter: Secondary | ICD-10-CM | POA: Diagnosis not present

## 2017-08-07 DIAGNOSIS — S60211A Contusion of right wrist, initial encounter: Secondary | ICD-10-CM | POA: Insufficient documentation

## 2017-08-07 DIAGNOSIS — S99921A Unspecified injury of right foot, initial encounter: Secondary | ICD-10-CM | POA: Diagnosis present

## 2017-08-07 DIAGNOSIS — Y999 Unspecified external cause status: Secondary | ICD-10-CM | POA: Insufficient documentation

## 2017-08-07 NOTE — Discharge Instructions (Signed)
Wear the boot anytime you are up and walking.  Follow up with your doctor

## 2017-08-07 NOTE — ED Triage Notes (Signed)
Pt was coming down stair and missed the last step causing her to fall. Pt c/o R foot pain and R wrist pain, denies hitting head.

## 2017-08-07 NOTE — ED Provider Notes (Signed)
[facility] EMERGENCY DEPARTMENT Provider Note   CSN: [id number] Arrival date & time: 08/07/17  1113     History   Chief Complaint Chief Complaint  Patient presents with  . Fall    HPI Tracy Buck is a 82 y.o. female.  The history is provided by the patient.  Fall  This is a new problem. Episode onset: 5 days ago. The problem occurs constantly. The problem has been gradually improving. Associated symptoms comments: Right wrist pain which was sore but improving.  Pain, swelling and bruising to the right foot mostly over the middles toes.  Able to walk on her heel but due to not getting a lot better came for evaluation.  Pain is achy and intermittently sharp.  It does not radiate.. The symptoms are aggravated by walking. Treatments tried: ice. The treatment provided no relief.    Past Medical History:  Diagnosis Date  . Arthritis    "fingers, knees" (09/16/2016)  . Complication of anesthesia    "I often go into severe panic attacks when they try to bring me out and they have to put me back under; sometimes happens when they are starting the anesthesia also" (09/16/2016)  . COPD (chronic obstructive pulmonary disease) ([place])    "I think this has been recently dx'd" (09/16/2016)  . Heart valve replaced   . Hyperlipidemia   . Hypertension   . Lung cancer ([place]) 2013  . Lung mass   . Myocardial infarct ([place])   . Panic attack   . Pneumonia    "when I was very young" (09/16/2016)  . Rib fracture   . Skin cancer of nose     Patient Active Problem List   Diagnosis Date Noted  . Cholecystitis 09/16/2016  . S/P AVR 09/16/2016  . S/P CABG (coronary artery bypass graft) 09/16/2016  . Essential hypertension 09/16/2016  . COPD (chronic obstructive pulmonary disease) ([place]) 09/16/2016  . Hyperglycemia 09/16/2016  . Pre-op evaluation   . Hyperlipidemia     Past Surgical History:  Procedure Laterality Date  . AORTIC VALVE REPLACEMENT     "bovine valve"  . BYPASS GRAFT      does not know if she had CABG (09/16/2016)  . CARDIAC SURGERY     does not know if she had CABG (09/16/2016)  . CARDIAC VALVE REPLACEMENT    . CARPAL TUNNEL RELEASE Right 1959  . CHOLECYSTECTOMY N/A 09/17/2016   Procedure: LAPAROSCOPIC CHOLECYSTECTOMY;  Surgeon: Ralene Ok, MD;  Location: [facility];  Service: General;  Laterality: N/A;  . [place]  . KNEE ARTHROSCOPY Left 09/2014  . LOBECTOMY Left 2013   "took bottom lobe off"  . SKIN CANCER EXCISION     "tip of my nose"  . TONSILLECTOMY      OB History    No data available       Home Medications    Prior to Admission medications   Medication Sig Start Date End Date Taking? Authorizing Provider  acetaminophen (TYLENOL) 500 MG tablet Take 500 mg by mouth every 6 (six) hours as needed. For pain.    [provider]  albuterol (PROVENTIL HFA;VENTOLIN HFA) 108 (90 BASE) MCG/ACT inhaler Inhale 2 puffs into the lungs every 6 (six) hours as needed. Patient uses 2 puffs of this medication at 6 am.    [provider]  albuterol (PROVENTIL) (2.5 MG/3ML) 0.083% nebulizer solution Take 2.5 mg by nebulization every 6 (six) hours as needed.  [provider]  ALPRAZolam Duanne Moron) 0.25 MG tablet Take 0.25-0.375 mg by mouth daily as needed for anxiety. 08/20/16   [provider]  aspirin 81 MG chewable tablet Chew 81 mg by mouth daily.    [provider]  benzonatate (TESSALON) 200 MG capsule Take 200 mg by mouth 3 (three) times daily as needed. For cough.    [provider]  calcium carbonate (TITRALAC) 420 MG CHEW Chew 420 mg by mouth daily as needed.    [provider]  docusate sodium (COLACE) 100 MG capsule Take 100 mg by mouth 2 (two) times daily.    [provider]  famotidine (PEPCID) 20 MG tablet Take 1 tablet (20 mg total) by mouth 2 (two) times daily. Patient not taking: Reported on 09/16/2016 04/28/12 09/16/16  Rhunette Croft, MD    Fluticasone-Salmeterol (ADVAIR) 250-50 MCG/DOSE AEPB Inhale 1 puff into the lungs every 12 (twelve) hours.    [provider]  HYDROcodone-acetaminophen (NORCO/VICODIN) 5-325 MG tablet Take 1-2 tablets by mouth every 6 (six) hours as needed for moderate pain or severe pain. 09/19/16   Ghimire, Henreitta Leber, MD  lisinopril-hydrochlorothiazide (PRINZIDE,ZESTORETIC) 20-12.5 MG tablet Take 0.5 tablets by mouth daily. 09/21/16   Ghimire, Henreitta Leber, MD  loratadine (CLARITIN) 10 MG tablet Take 10 mg by mouth daily.    [provider]  ondansetron (ZOFRAN) 4 MG tablet Take 4 mg by mouth every 8 (eight) hours as needed.    [provider]  phenazopyridine (PYRIDIUM) 200 MG tablet Take 1 tablet (200 mg total) by mouth 3 (three) times daily. 12/04/16   Dorie Rank, MD  rosuvastatin (CRESTOR) 20 MG tablet Take 20 mg by mouth daily.    [provider]  tiotropium (SPIRIVA HANDIHALER) 18 MCG inhalation capsule Place 18 mcg into inhaler and inhale daily.    [provider]  zolpidem (AMBIEN) 10 MG tablet Take 10 mg by mouth at bedtime as needed for sleep.    [provider]    Family History Family History  Problem Relation Age of Onset  . Transient ischemic attack Mother   . Colon cancer Father     Social History Social History   Tobacco Use  . Smoking status: Never Smoker  . Smokeless tobacco: Never Used  Substance Use Topics  . Alcohol use: Yes    Comment: 09/16/2016 "2-3 glasses of wine when at the beach; once/year"  . Drug use: No     Allergies   Fig extract [ficus] and Sulfa antibiotics   Review of Systems Review of Systems  All other systems reviewed and are negative.    Physical Exam Updated Vital Signs BP (!) 130/57 (BP Location: Right Arm)   Pulse 68   Temp 98.1 F (36.7 C) (Oral)   Resp 18   Ht 5\' 3"  (1.6 m)   Wt 78.9 kg (174 lb)   SpO2 98%   BMI 30.82 kg/m   Physical Exam  Constitutional: She is oriented to person,  place, and time. She appears well-developed and well-nourished. No distress.  HENT:  Head: Normocephalic and atraumatic.  Eyes: Pupils are equal, round, and reactive to light.  Cardiovascular: Normal rate.  Pulmonary/Chest: Effort normal.  Musculoskeletal:       Right wrist: She exhibits tenderness. She exhibits normal range of motion and no bony tenderness.       Right ankle: Normal. No head of 5th metatarsal and no proximal fibula tenderness found.       Arms:  Right foot: There is tenderness and swelling.       Feet:  Neurological: She is alert and oriented to person, place, and time.  Nursing note and vitals reviewed.    ED Treatments / Results  Labs (all labs ordered are listed, but only abnormal results are displayed) Labs Reviewed - No data to display  EKG  EKG Interpretation None       Radiology Dg Wrist Complete Right  Result Date: 08/07/2017 CLINICAL DATA:  82 year old female status post fall down stairs. Right wrist pain. EXAM: RIGHT WRIST - COMPLETE 3+ VIEW COMPARISON:  None. FINDINGS: Distal radius and ulna appear intact. Bone mineralization is within normal limits for age. Right scapholunate interval at the upper limits of normal (2 mm). Carpal bone alignment within normal limits. No carpal bone fracture identified. Scaphoid appears intact. Proximal metacarpals appear intact. IMPRESSION: No acute fracture or dislocation identified about the right wrist. Electronically Signed   By: Genevie Ann M.D.   On: 08/07/2017 12:21   Dg Foot Complete Right  Result Date: 08/07/2017 CLINICAL DATA:  82 year old female status post fall down stairs. Right foot pain. EXAM: RIGHT FOOT COMPLETE - 3+ VIEW COMPARISON:  None. FINDINGS: Comminuted but minimally displaced fracture of the right third metatarsal shaft and distal metadiaphysis. The third MTP joint appears to remain normal. There is slight plantar angulation of the third metatarsal metatarsal head suspected. Subtle  nondisplaced and extra-articular fracture also through the second metatarsal distal metadiaphysis. The first fourth and fifth metatarsals appear intact. No phalanx fracture or dislocation identified. Tarsal bones appear intact and normally aligned. Calcaneus appears intact with degenerative spurring. Grossly intact distal tibia and fibula. IMPRESSION: Minimally to nondisplaced extra-articular fractures of the distal second and third metatarsals ; the comminuted third metatarsal fracture extends into the shaft. Electronically Signed   By: Genevie Ann M.D.   On: 08/07/2017 12:23    Procedures Procedures (including critical care time)  Medications Ordered in ED Medications - No data to display   Initial Impression / Assessment and Plan / ED Course  I have reviewed the triage vital signs and the nursing notes.  Pertinent labs & imaging results that were available during my care of the patient were reviewed by me and considered in my medical decision making (see chart for details).     Pt with mechanical fall 5 days ago after she missed a step.  No head injury or LOC.  Able to walk but foot pain.  Initially wrist pain which is improving.  Wrist imaging is neg.  Foot film with distal metatarsal fx.  Pt placed in cam walker and she will f/u with her ortho Dr. Charna Archer.  Final Clinical Impressions(s) / ED Diagnoses   Final diagnoses:  Fall, initial encounter  Contusion of right wrist, initial encounter  Closed nondisplaced fracture of second metatarsal bone of right foot, initial encounter    ED Discharge Orders    None       Blanchie Dessert, MD 08/07/17 1227

## 2019-12-26 ENCOUNTER — Other Ambulatory Visit: Payer: Self-pay

## 2020-11-05 ENCOUNTER — Inpatient Hospital Stay (HOSPITAL_BASED_OUTPATIENT_CLINIC_OR_DEPARTMENT_OTHER)
Admission: EM | Admit: 2020-11-05 | Discharge: 2020-11-17 | DRG: 871 | Disposition: A | Discharge status: Home Health | Payer: Medicare Other | Attending: Family Medicine | Admitting: Family Medicine

## 2020-11-05 ENCOUNTER — Other Ambulatory Visit: Payer: Self-pay

## 2020-11-05 ENCOUNTER — Emergency Department (HOSPITAL_BASED_OUTPATIENT_CLINIC_OR_DEPARTMENT_OTHER): Payer: Medicare Other

## 2020-11-05 ENCOUNTER — Inpatient Hospital Stay (HOSPITAL_COMMUNITY): Payer: Medicare Other

## 2020-11-05 ENCOUNTER — Encounter (HOSPITAL_BASED_OUTPATIENT_CLINIC_OR_DEPARTMENT_OTHER): Payer: Self-pay | Admitting: Emergency Medicine

## 2020-11-05 DIAGNOSIS — R6520 Severe sepsis without septic shock: Secondary | ICD-10-CM | POA: Diagnosis not present

## 2020-11-05 DIAGNOSIS — E440 Moderate protein-calorie malnutrition: Secondary | ICD-10-CM | POA: Insufficient documentation

## 2020-11-05 DIAGNOSIS — Z7982 Long term (current) use of aspirin: Secondary | ICD-10-CM | POA: Diagnosis not present

## 2020-11-05 DIAGNOSIS — A419 Sepsis, unspecified organism: Principal | ICD-10-CM

## 2020-11-05 DIAGNOSIS — Z85118 Personal history of other malignant neoplasm of bronchus and lung: Secondary | ICD-10-CM | POA: Diagnosis not present

## 2020-11-05 DIAGNOSIS — Z95828 Presence of other vascular implants and grafts: Secondary | ICD-10-CM | POA: Diagnosis not present

## 2020-11-05 DIAGNOSIS — Z85820 Personal history of malignant melanoma of skin: Secondary | ICD-10-CM | POA: Diagnosis not present

## 2020-11-05 DIAGNOSIS — I340 Nonrheumatic mitral (valve) insufficiency: Secondary | ICD-10-CM | POA: Diagnosis not present

## 2020-11-05 DIAGNOSIS — E785 Hyperlipidemia, unspecified: Secondary | ICD-10-CM | POA: Diagnosis present

## 2020-11-05 DIAGNOSIS — E872 Acidosis, unspecified: Secondary | ICD-10-CM

## 2020-11-05 DIAGNOSIS — R778 Other specified abnormalities of plasma proteins: Secondary | ICD-10-CM

## 2020-11-05 DIAGNOSIS — Z902 Acquired absence of lung [part of]: Secondary | ICD-10-CM

## 2020-11-05 DIAGNOSIS — Z79899 Other long term (current) drug therapy: Secondary | ICD-10-CM

## 2020-11-05 DIAGNOSIS — G9341 Metabolic encephalopathy: Secondary | ICD-10-CM | POA: Diagnosis not present

## 2020-11-05 DIAGNOSIS — R5383 Other fatigue: Secondary | ICD-10-CM | POA: Diagnosis not present

## 2020-11-05 DIAGNOSIS — F410 Panic disorder [episodic paroxysmal anxiety] without agoraphobia: Secondary | ICD-10-CM | POA: Diagnosis present

## 2020-11-05 DIAGNOSIS — I4819 Other persistent atrial fibrillation: Secondary | ICD-10-CM

## 2020-11-05 DIAGNOSIS — Z9049 Acquired absence of other specified parts of digestive tract: Secondary | ICD-10-CM

## 2020-11-05 DIAGNOSIS — R309 Painful micturition, unspecified: Secondary | ICD-10-CM | POA: Diagnosis not present

## 2020-11-05 DIAGNOSIS — Z20822 Contact with and (suspected) exposure to covid-19: Secondary | ICD-10-CM | POA: Diagnosis not present

## 2020-11-05 DIAGNOSIS — Z952 Presence of prosthetic heart valve: Secondary | ICD-10-CM

## 2020-11-05 DIAGNOSIS — B370 Candidal stomatitis: Secondary | ICD-10-CM | POA: Diagnosis not present

## 2020-11-05 DIAGNOSIS — B379 Candidiasis, unspecified: Secondary | ICD-10-CM | POA: Diagnosis not present

## 2020-11-05 DIAGNOSIS — R0682 Tachypnea, not elsewhere classified: Secondary | ICD-10-CM

## 2020-11-05 DIAGNOSIS — I4891 Unspecified atrial fibrillation: Secondary | ICD-10-CM

## 2020-11-05 DIAGNOSIS — I272 Pulmonary hypertension, unspecified: Secondary | ICD-10-CM

## 2020-11-05 DIAGNOSIS — I2510 Atherosclerotic heart disease of native coronary artery without angina pectoris: Secondary | ICD-10-CM | POA: Diagnosis present

## 2020-11-05 DIAGNOSIS — I248 Other forms of acute ischemic heart disease: Secondary | ICD-10-CM | POA: Diagnosis present

## 2020-11-05 DIAGNOSIS — A427 Actinomycotic sepsis: Secondary | ICD-10-CM | POA: Diagnosis not present

## 2020-11-05 DIAGNOSIS — A429 Actinomycosis, unspecified: Secondary | ICD-10-CM

## 2020-11-05 DIAGNOSIS — R6521 Severe sepsis with septic shock: Secondary | ICD-10-CM | POA: Diagnosis not present

## 2020-11-05 DIAGNOSIS — I110 Hypertensive heart disease with heart failure: Secondary | ICD-10-CM | POA: Diagnosis present

## 2020-11-05 DIAGNOSIS — I447 Left bundle-branch block, unspecified: Secondary | ICD-10-CM | POA: Diagnosis present

## 2020-11-05 DIAGNOSIS — Q211 Atrial septal defect: Secondary | ICD-10-CM | POA: Diagnosis not present

## 2020-11-05 DIAGNOSIS — R9431 Abnormal electrocardiogram [ECG] [EKG]: Secondary | ICD-10-CM | POA: Diagnosis not present

## 2020-11-05 DIAGNOSIS — I361 Nonrheumatic tricuspid (valve) insufficiency: Secondary | ICD-10-CM

## 2020-11-05 DIAGNOSIS — K2980 Duodenitis without bleeding: Secondary | ICD-10-CM | POA: Diagnosis not present

## 2020-11-05 DIAGNOSIS — J449 Chronic obstructive pulmonary disease, unspecified: Secondary | ICD-10-CM | POA: Diagnosis present

## 2020-11-05 DIAGNOSIS — J918 Pleural effusion in other conditions classified elsewhere: Secondary | ICD-10-CM | POA: Diagnosis present

## 2020-11-05 DIAGNOSIS — I482 Chronic atrial fibrillation, unspecified: Secondary | ICD-10-CM | POA: Diagnosis not present

## 2020-11-05 DIAGNOSIS — E871 Hypo-osmolality and hyponatremia: Secondary | ICD-10-CM | POA: Diagnosis not present

## 2020-11-05 DIAGNOSIS — R7881 Bacteremia: Secondary | ICD-10-CM | POA: Diagnosis not present

## 2020-11-05 DIAGNOSIS — E876 Hypokalemia: Secondary | ICD-10-CM | POA: Diagnosis present

## 2020-11-05 DIAGNOSIS — I5031 Acute diastolic (congestive) heart failure: Secondary | ICD-10-CM | POA: Diagnosis present

## 2020-11-05 DIAGNOSIS — J810 Acute pulmonary edema: Secondary | ICD-10-CM | POA: Diagnosis not present

## 2020-11-05 DIAGNOSIS — Z800 Family history of malignant neoplasm of digestive organs: Secondary | ICD-10-CM

## 2020-11-05 DIAGNOSIS — J9601 Acute respiratory failure with hypoxia: Secondary | ICD-10-CM | POA: Diagnosis present

## 2020-11-05 DIAGNOSIS — Z953 Presence of xenogenic heart valve: Secondary | ICD-10-CM | POA: Diagnosis not present

## 2020-11-05 DIAGNOSIS — Z7951 Long term (current) use of inhaled steroids: Secondary | ICD-10-CM

## 2020-11-05 DIAGNOSIS — Z9889 Other specified postprocedural states: Secondary | ICD-10-CM | POA: Diagnosis not present

## 2020-11-05 DIAGNOSIS — I252 Old myocardial infarction: Secondary | ICD-10-CM

## 2020-11-05 DIAGNOSIS — N179 Acute kidney failure, unspecified: Secondary | ICD-10-CM

## 2020-11-05 DIAGNOSIS — Z951 Presence of aortocoronary bypass graft: Secondary | ICD-10-CM

## 2020-11-05 DIAGNOSIS — R7989 Other specified abnormal findings of blood chemistry: Secondary | ICD-10-CM

## 2020-11-05 DIAGNOSIS — R5381 Other malaise: Secondary | ICD-10-CM | POA: Diagnosis present

## 2020-11-05 DIAGNOSIS — Z6830 Body mass index (BMI) 30.0-30.9, adult: Secondary | ICD-10-CM

## 2020-11-05 DIAGNOSIS — J90 Pleural effusion, not elsewhere classified: Secondary | ICD-10-CM | POA: Diagnosis not present

## 2020-11-05 HISTORY — DX: Left bundle-branch block, unspecified: I44.7

## 2020-11-05 LAB — LACTIC ACID, PLASMA
Lactic Acid, Venous: 8.7 mmol/L (ref 0.5–1.9)
Lactic Acid, Venous: 2.2 mmol/L (ref 0.5–1.9)
Lactic Acid, Venous: 2.3 mmol/L (ref 0.5–1.9)

## 2020-11-05 LAB — ECHOCARDIOGRAM COMPLETE
AR max vel: 1.22 cm2
AV Area VTI: 1.18 cm2
AV Area mean vel: 1.2 cm2
AV Mean grad: 21.1 mmHg
AV Peak grad: 32.3 mmHg
Ao pk vel: 2.84 m/s
Calc EF: 52.1 %
Height: 63 in
MV M vel: 5.6 m/s
MV Peak grad: 125.4 mmHg
Radius: 0.6 cm
S' Lateral: 2.9 cm
Single Plane A2C EF: 48.7 %
Single Plane A4C EF: 56.7 %
Weight: 2758.4 oz

## 2020-11-05 LAB — CBC WITH DIFFERENTIAL/PLATELET
Abs Immature Granulocytes: 0.63 10*3/uL — ABNORMAL HIGH (ref 0.00–0.07)
Abs Immature Granulocytes: 0.3 10*3/uL — ABNORMAL HIGH (ref 0.00–0.07)
Band Neutrophils: 4 %
Basophils Absolute: 0.1 10*3/uL (ref 0.0–0.1)
Basophils Absolute: 0 10*3/uL (ref 0.0–0.1)
Basophils Relative: 1 %
Basophils Relative: 0 %
Eosinophils Absolute: 0 10*3/uL (ref 0.0–0.5)
Eosinophils Absolute: 0 10*3/uL (ref 0.0–0.5)
Eosinophils Relative: 0 %
Eosinophils Relative: 0 %
HCT: 38.6 % (ref 36.0–46.0)
HCT: 30.9 % — ABNORMAL LOW (ref 36.0–46.0)
Hemoglobin: 12.7 g/dL (ref 12.0–15.0)
Hemoglobin: 10.7 g/dL — ABNORMAL LOW (ref 12.0–15.0)
Immature Granulocytes: 3 %
Lymphocytes Relative: 7 %
Lymphocytes Relative: 1 %
Lymphs Abs: 1.5 10*3/uL (ref 0.7–4.0)
Lymphs Abs: 0.3 10*3/uL — ABNORMAL LOW (ref 0.7–4.0)
MCH: 29.9 pg (ref 26.0–34.0)
MCH: 30.1 pg (ref 26.0–34.0)
MCHC: 32.9 g/dL (ref 30.0–36.0)
MCHC: 34.6 g/dL (ref 30.0–36.0)
MCV: 90.8 fL (ref 80.0–100.0)
MCV: 87 fL (ref 80.0–100.0)
Metamyelocytes Relative: 1 %
Monocytes Absolute: 0.7 10*3/uL (ref 0.1–1.0)
Monocytes Absolute: 0.3 10*3/uL (ref 0.1–1.0)
Monocytes Relative: 4 %
Monocytes Relative: 1 %
Neutro Abs: 17.5 10*3/uL — ABNORMAL HIGH (ref 1.7–7.7)
Neutro Abs: 25.4 10*3/uL — ABNORMAL HIGH (ref 1.7–7.7)
Neutrophils Relative %: 85 %
Neutrophils Relative %: 93 %
Platelets: 161 10*3/uL (ref 150–400)
Platelets: 108 10*3/uL — ABNORMAL LOW (ref 150–400)
RBC: 4.25 MIL/uL (ref 3.87–5.11)
RBC: 3.55 MIL/uL — ABNORMAL LOW (ref 3.87–5.11)
RDW: 14.6 % (ref 11.5–15.5)
RDW: 14.3 % (ref 11.5–15.5)
Smear Review: NORMAL
WBC: 20.5 10*3/uL — ABNORMAL HIGH (ref 4.0–10.5)
WBC: 26.2 10*3/uL — ABNORMAL HIGH (ref 4.0–10.5)
nRBC: 0.3 % — ABNORMAL HIGH (ref 0.0–0.2)
nRBC: 0 % (ref 0.0–0.2)

## 2020-11-05 LAB — COMPREHENSIVE METABOLIC PANEL
ALT: 52 U/L — ABNORMAL HIGH (ref 0–44)
AST: 83 U/L — ABNORMAL HIGH (ref 15–41)
Albumin: 2 g/dL — ABNORMAL LOW (ref 3.5–5.0)
Alkaline Phosphatase: 139 U/L — ABNORMAL HIGH (ref 38–126)
Anion gap: 8 (ref 5–15)
BUN: 15 mg/dL (ref 8–23)
CO2: 18 mmol/L — ABNORMAL LOW (ref 22–32)
Calcium: 7.8 mg/dL — ABNORMAL LOW (ref 8.9–10.3)
Chloride: 101 mmol/L (ref 98–111)
Creatinine, Ser: 1.2 mg/dL — ABNORMAL HIGH (ref 0.44–1.00)
GFR, Estimated: 45 mL/min — ABNORMAL LOW (ref >60)
Glucose, Bld: 176 mg/dL — ABNORMAL HIGH (ref 70–99)
Potassium: 3.1 mmol/L — ABNORMAL LOW (ref 3.5–5.1)
Sodium: 127 mmol/L — ABNORMAL LOW (ref 135–145)
Total Bilirubin: 2.8 mg/dL — ABNORMAL HIGH (ref 0.3–1.2)
Total Protein: 4.6 g/dL — ABNORMAL LOW (ref 6.5–8.1)

## 2020-11-05 LAB — MAGNESIUM: Magnesium: 1.7 mg/dL (ref 1.7–2.4)

## 2020-11-05 LAB — BASIC METABOLIC PANEL
Anion gap: 19 — ABNORMAL HIGH (ref 5–15)
BUN: 16 mg/dL (ref 8–23)
CO2: 11 mmol/L — ABNORMAL LOW (ref 22–32)
Calcium: 8.2 mg/dL — ABNORMAL LOW (ref 8.9–10.3)
Chloride: 99 mmol/L (ref 98–111)
Creatinine, Ser: 1.33 mg/dL — ABNORMAL HIGH (ref 0.44–1.00)
GFR, Estimated: 40 mL/min — ABNORMAL LOW (ref >60)
Glucose, Bld: 278 mg/dL — ABNORMAL HIGH (ref 70–99)
Potassium: 3.7 mmol/L (ref 3.5–5.1)
Sodium: 129 mmol/L — ABNORMAL LOW (ref 135–145)

## 2020-11-05 LAB — TROPONIN I (HIGH SENSITIVITY)
Troponin I (High Sensitivity): 1716 ng/L (ref <18)
Troponin I (High Sensitivity): 1693 ng/L (ref <18)
Troponin I (High Sensitivity): 1404 ng/L (ref <18)

## 2020-11-05 LAB — I-STAT VENOUS BLOOD GAS, ED
Acid-base deficit: 10 mmol/L — ABNORMAL HIGH (ref 0.0–2.0)
Bicarbonate: 13.7 mmol/L — ABNORMAL LOW (ref 20.0–28.0)
Calcium, Ion: 1.11 mmol/L — ABNORMAL LOW (ref 1.15–1.40)
HCT: 38 % (ref 36.0–46.0)
Hemoglobin: 12.9 g/dL (ref 12.0–15.0)
O2 Saturation: 84 %
Patient temperature: 100.3
Potassium: 3.3 mmol/L — ABNORMAL LOW (ref 3.5–5.1)
Sodium: 131 mmol/L — ABNORMAL LOW (ref 135–145)
TCO2: 14 mmol/L — ABNORMAL LOW (ref 22–32)
pCO2, Ven: 26.4 mmHg — ABNORMAL LOW (ref 44.0–60.0)
pH, Ven: 7.328 (ref 7.250–7.430)
pO2, Ven: 53 mmHg — ABNORMAL HIGH (ref 32.0–45.0)

## 2020-11-05 LAB — GLUCOSE, CAPILLARY
Glucose-Capillary: 179 mg/dL — ABNORMAL HIGH (ref 70–99)
Glucose-Capillary: 164 mg/dL — ABNORMAL HIGH (ref 70–99)
Glucose-Capillary: 133 mg/dL — ABNORMAL HIGH (ref 70–99)

## 2020-11-05 LAB — PROTIME-INR
INR: 1.3 — ABNORMAL HIGH (ref 0.8–1.2)
Prothrombin Time: 15.6 seconds — ABNORMAL HIGH (ref 11.4–15.2)

## 2020-11-05 LAB — HEPATIC FUNCTION PANEL
ALT: 41 U/L (ref 0–44)
AST: 77 U/L — ABNORMAL HIGH (ref 15–41)
Albumin: 2.5 g/dL — ABNORMAL LOW (ref 3.5–5.0)
Alkaline Phosphatase: 162 U/L — ABNORMAL HIGH (ref 38–126)
Bilirubin, Direct: 0.7 mg/dL — ABNORMAL HIGH (ref 0.0–0.2)
Indirect Bilirubin: 1.2 mg/dL — ABNORMAL HIGH (ref 0.3–0.9)
Total Bilirubin: 1.9 mg/dL — ABNORMAL HIGH (ref 0.3–1.2)
Total Protein: 5.8 g/dL — ABNORMAL LOW (ref 6.5–8.1)

## 2020-11-05 LAB — URINALYSIS, ROUTINE W REFLEX MICROSCOPIC
Glucose, UA: 100 mg/dL — AB
Ketones, ur: NEGATIVE mg/dL
Leukocytes,Ua: NEGATIVE
Nitrite: NEGATIVE
Protein, ur: 30 mg/dL — AB
Specific Gravity, Urine: >1.03 — ABNORMAL HIGH (ref 1.005–1.030)
pH: 5.5 (ref 5.0–8.0)

## 2020-11-05 LAB — LIPASE, BLOOD: Lipase: 25 U/L (ref 11–51)

## 2020-11-05 LAB — APTT: aPTT: 32 seconds (ref 24–36)

## 2020-11-05 LAB — URINALYSIS, MICROSCOPIC (REFLEX)

## 2020-11-05 LAB — RESP PANEL BY RT-PCR (FLU A&B, COVID) ARPGX2
Influenza A by PCR: NEGATIVE
Influenza B by PCR: NEGATIVE
SARS Coronavirus 2 by RT PCR: NEGATIVE

## 2020-11-05 LAB — PROCALCITONIN: Procalcitonin: 68.77 ng/mL

## 2020-11-05 LAB — MRSA PCR SCREENING: MRSA by PCR: NEGATIVE

## 2020-11-05 LAB — PHOSPHORUS: Phosphorus: 2.5 mg/dL (ref 2.5–4.6)

## 2020-11-05 LAB — BETA-HYDROXYBUTYRIC ACID: Beta-Hydroxybutyric Acid: 0.46 mmol/L — ABNORMAL HIGH (ref 0.05–0.27)

## 2020-11-05 LAB — BRAIN NATRIURETIC PEPTIDE: B Natriuretic Peptide: 1682.9 pg/mL — ABNORMAL HIGH (ref 0.0–100.0)

## 2020-11-05 MED ORDER — PIPERACILLIN-TAZOBACTAM 3.375 G IVPB
3.3750 g | Freq: Three times a day (TID) | INTRAVENOUS | Status: DC
  Administered 2020-11-05 – 2020-11-09 (×11): 3.375 g via INTRAVENOUS
  Filled 2020-11-05 (×10): qty 50

## 2020-11-05 MED ORDER — HEPARIN (PORCINE) 25000 UT/250ML-% IV SOLN
1200.0000 [IU]/h | INTRAVENOUS | Status: DC
  Administered 2020-11-05: 950 [IU]/h via INTRAVENOUS
  Filled 2020-11-05 (×2): qty 250

## 2020-11-05 MED ORDER — IPRATROPIUM-ALBUTEROL 0.5-2.5 (3) MG/3ML IN SOLN: 3.0000 mL | Freq: Four times a day (QID) | RESPIRATORY_TRACT | Status: DC

## 2020-11-05 MED ORDER — POTASSIUM CHLORIDE 20 MEQ PO PACK
20.0000 meq | PACK | Freq: Once | ORAL | Status: AC
  Administered 2020-11-05: 20 meq via ORAL
  Filled 2020-11-05: qty 1

## 2020-11-05 MED ORDER — DILTIAZEM HCL 25 MG/5ML IV SOLN: 20.0000 mg | Freq: Once | INTRAVENOUS | Status: AC

## 2020-11-05 MED ORDER — ORAL CARE MOUTH RINSE
15.0000 mL | Freq: Two times a day (BID) | OROMUCOSAL | Status: DC
  Administered 2020-11-05: 15 mL via OROMUCOSAL

## 2020-11-05 MED ORDER — BUDESONIDE 0.5 MG/2ML IN SUSP
0.5000 mg | Freq: Two times a day (BID) | RESPIRATORY_TRACT | Status: DC
  Administered 2020-11-05: 0.5 mg via RESPIRATORY_TRACT
  Filled 2020-11-05 (×2): qty 2

## 2020-11-05 MED ORDER — POTASSIUM CHLORIDE 10 MEQ/100ML IV SOLN
10.0000 meq | INTRAVENOUS | Status: AC
  Administered 2020-11-05 (×4): 10 meq via INTRAVENOUS
  Filled 2020-11-05 (×4): qty 100

## 2020-11-05 MED ORDER — DILTIAZEM HCL 25 MG/5ML IV SOLN
INTRAVENOUS | Status: AC
  Administered 2020-11-05: 20 mg via INTRAVENOUS
  Filled 2020-11-05: qty 5

## 2020-11-05 MED ORDER — POLYETHYLENE GLYCOL 3350 17 G PO PACK: 17.0000 g | PACK | Freq: Every day | ORAL | Status: DC | PRN

## 2020-11-05 MED ORDER — PANTOPRAZOLE SODIUM 40 MG IV SOLR
40.0000 mg | Freq: Every day | INTRAVENOUS | Status: DC
  Administered 2020-11-05: 40 mg via INTRAVENOUS
  Filled 2020-11-05: qty 40

## 2020-11-05 MED ORDER — DOCUSATE SODIUM 100 MG PO CAPS: 100.0000 mg | ORAL_CAPSULE | Freq: Two times a day (BID) | ORAL | Status: DC | PRN

## 2020-11-05 MED ORDER — LACTATED RINGERS IV BOLUS
1000.0000 mL | Freq: Once | INTRAVENOUS | Status: AC
  Administered 2020-11-05: 1000 mL via INTRAVENOUS

## 2020-11-05 MED ORDER — MELATONIN 3 MG PO TABS
3.0000 mg | ORAL_TABLET | Freq: Every day | ORAL | Status: DC
  Administered 2020-11-05 – 2020-11-16 (×12): 3 mg via ORAL
  Filled 2020-11-05 (×12): qty 1

## 2020-11-05 MED ORDER — IPRATROPIUM-ALBUTEROL 0.5-2.5 (3) MG/3ML IN SOLN
3.0000 mL | Freq: Three times a day (TID) | RESPIRATORY_TRACT | Status: DC
  Administered 2020-11-05 – 2020-11-06 (×2): 3 mL via RESPIRATORY_TRACT
  Filled 2020-11-05 (×2): qty 3

## 2020-11-05 MED ORDER — SODIUM CHLORIDE 0.9 % IV SOLN: INTRAVENOUS | Status: DC | PRN

## 2020-11-05 MED ORDER — LORAZEPAM 2 MG/ML IJ SOLN
1.0000 mg | Freq: Once | INTRAMUSCULAR | Status: AC
  Administered 2020-11-05: 1 mg via INTRAVENOUS
  Filled 2020-11-05: qty 1

## 2020-11-05 MED ORDER — METRONIDAZOLE IN NACL 5-0.79 MG/ML-% IV SOLN
500.0000 mg | Freq: Once | INTRAVENOUS | Status: AC
  Administered 2020-11-05: 500 mg via INTRAVENOUS
  Filled 2020-11-05: qty 100

## 2020-11-05 MED ORDER — VANCOMYCIN HCL 750 MG/150ML IV SOLN
750.0000 mg | INTRAVENOUS | Status: DC
  Administered 2020-11-06: 750 mg via INTRAVENOUS
  Filled 2020-11-05 (×2): qty 150

## 2020-11-05 MED ORDER — SODIUM CHLORIDE 0.9 % IV SOLN: 2.0000 g | Freq: Two times a day (BID) | INTRAVENOUS | Status: DC

## 2020-11-05 MED ORDER — CHLORHEXIDINE GLUCONATE CLOTH 2 % EX PADS
6.0000 | MEDICATED_PAD | Freq: Every day | CUTANEOUS | Status: DC
  Administered 2020-11-05 – 2020-11-17 (×13): 6 via TOPICAL

## 2020-11-05 MED ORDER — MAGNESIUM SULFATE 2 GM/50ML IV SOLN
2.0000 g | Freq: Once | INTRAVENOUS | Status: AC
  Administered 2020-11-05: 2 g via INTRAVENOUS
  Filled 2020-11-05: qty 50

## 2020-11-05 MED ORDER — LACTATED RINGERS IV BOLUS
250.0000 mL | Freq: Once | INTRAVENOUS | Status: AC
  Administered 2020-11-05: 250 mL via INTRAVENOUS

## 2020-11-05 MED ORDER — CHLORHEXIDINE GLUCONATE 0.12 % MT SOLN
15.0000 mL | Freq: Two times a day (BID) | OROMUCOSAL | Status: DC
  Administered 2020-11-05 – 2020-11-06 (×2): 15 mL via OROMUCOSAL
  Filled 2020-11-05 (×2): qty 15

## 2020-11-05 MED ORDER — VANCOMYCIN HCL IN DEXTROSE 1-5 GM/200ML-% IV SOLN
1000.0000 mg | Freq: Once | INTRAVENOUS | Status: AC
  Administered 2020-11-05: 1000 mg via INTRAVENOUS
  Filled 2020-11-05: qty 200

## 2020-11-05 MED ORDER — ACETAMINOPHEN 325 MG PO TABS
650.0000 mg | ORAL_TABLET | Freq: Four times a day (QID) | ORAL | Status: DC | PRN
  Administered 2020-11-06 – 2020-11-17 (×5): 650 mg via ORAL
  Filled 2020-11-05 (×6): qty 2

## 2020-11-05 MED ORDER — ACETAMINOPHEN 650 MG RE SUPP
650.0000 mg | Freq: Once | RECTAL | Status: AC
  Administered 2020-11-05: 650 mg via RECTAL
  Filled 2020-11-05: qty 1

## 2020-11-05 MED ORDER — SODIUM CHLORIDE 0.9 % IV SOLN
2.0000 g | Freq: Once | INTRAVENOUS | Status: AC
  Administered 2020-11-05: 2 g via INTRAVENOUS
  Filled 2020-11-05: qty 2

## 2020-11-05 MED ORDER — HEPARIN BOLUS VIA INFUSION
3500.0000 [IU] | Freq: Once | INTRAVENOUS | Status: AC
  Administered 2020-11-05: 3500 [IU] via INTRAVENOUS
  Filled 2020-11-05: qty 3500

## 2020-11-05 MED ORDER — DILTIAZEM HCL-DEXTROSE 125-5 MG/125ML-% IV SOLN (PREMIX)
5.0000 mg/h | INTRAVENOUS | Status: DC
  Administered 2020-11-05: 5 mg/h via INTRAVENOUS
  Filled 2020-11-05: qty 125

## 2020-11-05 MED ORDER — FENTANYL CITRATE (PF) 100 MCG/2ML IJ SOLN
INTRAMUSCULAR | Status: AC
  Administered 2020-11-05: 50 ug via INTRAVENOUS
  Filled 2020-11-05: qty 2

## 2020-11-05 MED ORDER — FENTANYL CITRATE (PF) 100 MCG/2ML IJ SOLN
25.0000 ug | INTRAMUSCULAR | Status: DC | PRN
  Administered 2020-11-05: 50 ug via INTRAVENOUS
  Filled 2020-11-05 (×2): qty 2

## 2020-11-05 MED ORDER — FENTANYL CITRATE (PF) 100 MCG/2ML IJ SOLN
25.0000 ug | Freq: Once | INTRAMUSCULAR | Status: AC
  Administered 2020-11-05: 25 ug via INTRAVENOUS
  Filled 2020-11-05: qty 2

## 2020-11-05 MED ORDER — STERILE WATER FOR INJECTION IV SOLN
INTRAVENOUS | Status: AC
  Filled 2020-11-05 (×2): qty 850

## 2020-11-05 MED ORDER — FENTANYL CITRATE (PF) 100 MCG/2ML IJ SOLN: 50.0000 ug | Freq: Once | INTRAMUSCULAR | Status: AC

## 2020-11-05 MED ORDER — LACTATED RINGERS IV SOLN: INTRAVENOUS | Status: DC

## 2020-11-05 MED ORDER — LACTATED RINGERS IV BOLUS (SEPSIS)
1000.0000 mL | Freq: Once | INTRAVENOUS | Status: AC
  Administered 2020-11-05: 1000 mL via INTRAVENOUS

## 2020-11-05 MED ORDER — FENTANYL CITRATE (PF) 100 MCG/2ML IJ SOLN: 25.0000 ug | INTRAMUSCULAR | Status: DC | PRN

## 2020-11-05 NOTE — ED Triage Notes (Signed)
Patient arrived via POV c/o shob with elevated RR and SPO2 of 87%. Per daughter patient has hx of asthma, lobectomy, and recently had dosage of ambien reduced by 50% and zoloft stopped. Patient presents with increased HR, with hyperventilation. Patient encouraged to take slow deep breaths with no compliance. Provider notified. Patient is AO x 4, increased HR, RR, placed on 6L O2.

## 2020-11-05 NOTE — H&P (Signed)
NAME:  Tracy Buck, MRN:  [id number], DOB:  March 20, 1935, LOS: 0 ADMISSION DATE:  11/05/2020, CONSULTATION DATE:  3/16 REFERRING MD:  Dr Billy Fischer EDP, CHIEF COMPLAINT:  Sepsis  Brief History:  86 year old female admitted 3/16 with concerns of sepsis with unclear source and requiring BiPAP for hypoxia while at [facility]. Transferred to [facility] on empiric antibiotics.   History of Present Illness:  86 year old female with PMH as below, which is significant for COPD, HTN, bioprosthetic aortic valve (2021), CAD, and lung cancer (carcinoid) s/p lobectomy. She was in her usual state of health until approximately 3/6 when she developed progressive fatigue. She saw PCP and cardiologist with these complaints without identifiable etiology. It was felt that perhaps polypharmacy was playing a role. Her PRN alprazolam was discontinued and Ambien dose was reduced from 10mg  to 5mg  QHS.   Then 3/16 early AM she developed shortness of breath and chest tightness, prompting her to present to [facility]. No associated symptoms were described by to EDP. EKG was felt to be non-ischemic and represented AF-RVR. SHe was started on diltiazem bolus/infusion. On further evaluation she has low grade fever (100F oral) and leukocytosis, so sepsis was considered. She was started on empiric antibiotics and sepsis workup was initiated. No clear source identified although CXR did describe a left sided effusion at her former lobectomy site. Laboratory evaluation otherwise significant for Troponin 1700, BNP 1682, Creatinine 1.33, glucose 278, lactic acid 8.7. She then became hypotensive. Diltiazem was stopped and she was given IVF. Transferred to [facility] with PCCM accepting for ICU admission.   Past Medical History:   has a past medical history of Arthritis, Complication of anesthesia, COPD (chronic obstructive pulmonary disease) ([place]), Heart valve replaced, Hyperlipidemia, Hypertension, LBBB (left bundle branch block), Lung cancer  ([place]) (2013), Lung mass, Myocardial infarct ([place]), Panic attack, Pneumonia, Rib fracture, and Skin cancer of nose.   Significant Hospital Events:  3/16 admit to ICU with working diagnosis sepsis  Consults:    Procedures:    Significant Diagnostic Tests:    Micro Data:  Blood 3/16 > Urine 3/16 >  Antimicrobials:  Flagyl 3/16 ED Cefepime 3/16 > Vancomycin 3/16 >  Interim History / Subjective:    Objective   Blood pressure (!) 93/53, pulse (!) 50, temperature (!) 102.8 F (39.3 C), resp. rate (!) 27, height 5\' 3"  (1.6 m), weight 76.2 kg, SpO2 98 %.    FiO2 (%):  [30 %] 30 %   Intake/Output Summary (Last 24 hours) at 11/05/2020 0823 Last data filed at 11/05/2020 0814 Gross per 24 hour  Intake 93.55 ml  Output 17 ml  Net 76.55 ml   Filed Weights   11/05/20 0648 11/05/20 0707  Weight: 76.2 kg 76.2 kg    Examination: General: Elderly female in NAD on BiPAP HENT: [place]/AT, PERRL, no JVD Lungs: Tachypnea on BiPAP.  Cardiovascular: RRR, no MRG Abdomen: Soft, non-tender, non-distended Extremities: No acute deformity. Moving all four extremities Neuro: Alert, oriented, fatigued. Non-focal  Resolved Hospital Problem list     Assessment & Plan:   Possible sepsis: source unclear. UA without obvious infection. No clear pneumonia. L sided effusion will warrant further investigation - Continue empiric cefepime/Vancomycin - Cultures pending - Sample left pleural fluid  Troponin elevation: High sensitivity troponin elevated to 1700 on presentation. Known LBBB on previous EKG. No acute ischemic findings.  - Echocardiogram - Trend troponin - Will ask cardiology to see - Continue home ASA once taking PO  Acute hypoxemic respiratory failure COPD without acute exacerbation - PRN BiPAP - SpO2 goal 90-95% - ABG - Scheduled duonebs, budesonide to replace home advair and spiriva  Lactic acidosis: uncertain etiology.  -  Trend lactic to ensure clearance.   Atrial  fibrillation: new onset. Cardizem started in ED, but DC due to hypotension/sepsis - Telemetry monitoring - heparin infusion per pharmacy  History of LLL carcinoid s/p left lower lobectomy  Melanoma removal this year from nose - sample pleural fluid  Best practice (evaluated daily)  Diet: NPO Pain/Anxiety/Delirium protocol (if indicated): NA VAP protocol (if indicated): NA DVT prophylaxis: full dose heparin GI prophylaxis: PPI Glucose control: NA Mobility: Bedrest Disposition:ICU  Goals of Care:  Last date of multidisciplinary goals of care discussion: 3/16 Family and staff present: Dr Tamala Julian, myself, patient and both daughters Summary of discussion: Full code. No prolonged life support Follow up goals of care discussion due: 3/23 Code Status: FULL  Labs   CBC: Recent Labs  Lab 11/05/20 0649 11/05/20 0730  WBC 20.5*  --   NEUTROABS 17.5*  --   HGB 12.7 12.9  HCT 38.6 38.0  MCV 90.8  --   PLT 161  --     Basic Metabolic Panel: Recent Labs  Lab 11/05/20 0649 11/05/20 0730  NA 129* 131*  K 3.7 3.3*  CL 99  --   CO2 11*  --   GLUCOSE 278*  --   BUN 16  --   CREATININE 1.33*  --   CALCIUM 8.2*  --    GFR: Estimated Creatinine Clearance: 31.4 mL/min (A) (by C-G formula based on SCr of 1.33 mg/dL (H)). Recent Labs  Lab 11/05/20 0649 11/05/20 0650  WBC 20.5*  --   LATICACIDVEN  --  8.7*    Liver Function Tests: Recent Labs  Lab 11/05/20 0649  AST 77*  ALT 41  ALKPHOS 162*  BILITOT 1.9*  PROT 5.8*  ALBUMIN 2.5*   No results for input(s): LIPASE, AMYLASE in the last 168 hours. No results for input(s): AMMONIA in the last 168 hours.  ABG    Component Value Date/Time   HCO3 13.7 (L) 11/05/2020 0730   TCO2 14 (L) 11/05/2020 0730   ACIDBASEDEF 10.0 (H) 11/05/2020 0730   O2SAT 84.0 11/05/2020 0730     Coagulation Profile: Recent Labs  Lab 11/05/20 0726  INR 1.3*    Cardiac Enzymes: No results for input(s): CKTOTAL, CKMB, CKMBINDEX,  TROPONINI in the last 168 hours.  HbA1C: No results found for: HGBA1C  CBG: No results for input(s): GLUCAP in the last 168 hours.  Review of Systems:   Bolds are positive  Constitutional: weight loss, gain, night sweats, Fevers, chills, fatigue .  HEENT: headaches, Sore throat, sneezing, nasal congestion, post nasal drip, Difficulty swallowing, Tooth/dental problems, visual complaints visual changes, ear ache CV:  chest pain, radiates:,Orthopnea, PND, swelling in lower extremities, dizziness, palpitations, syncope.  GI  heartburn, indigestion, abdominal pain, nausea, vomiting, diarrhea, change in bowel habits, loss of appetite, bloody stools Resp: cough- non-productive, productive:, hemoptysis, dyspnea, chest pain, pleuritic.  Skin: rash or itching or icterus GU: dysuria, change in color of urine, urgency or frequency. flank pain, hematuria  MS: joint pain or swelling. decreased range of motion  Psych: change in mood or affect. depression or anxiety.  Neuro: difficulty with speech, weakness, numbness, ataxia    Past Medical History:  She,  has a past medical history of Arthritis, Complication of anesthesia, COPD (chronic obstructive pulmonary disease) ([place]), Heart  valve replaced, Hyperlipidemia, Hypertension, LBBB (left bundle branch block), Lung cancer ([place]) (2013), Lung mass, Myocardial infarct ([place]), Panic attack, Pneumonia, Rib fracture, and Skin cancer of nose.   Surgical History:   Past Surgical History:  Procedure Laterality Date  . AORTIC VALVE REPLACEMENT     "bovine valve"  . BYPASS GRAFT     does not know if she had CABG (09/16/2016)  . CARDIAC SURGERY     does not know if she had CABG (09/16/2016)  . CARDIAC VALVE REPLACEMENT    . CARPAL TUNNEL RELEASE Right 1959  . CHOLECYSTECTOMY N/A 09/17/2016   Procedure: LAPAROSCOPIC CHOLECYSTECTOMY;  Surgeon: Ralene Ok, MD;  Location: [facility];  Service: General;  Laterality: N/A;  . [place]  .  KNEE ARTHROSCOPY Left 09/2014  . LOBECTOMY Left 2013   "took bottom lobe off"  . SKIN CANCER EXCISION     "tip of my nose"  . TONSILLECTOMY       Social History:   reports that she has never smoked. She has never used smokeless tobacco. She reports current alcohol use. She reports that she does not use drugs.   Family History:  Her family history includes Colon cancer in her father; Transient ischemic attack in her mother.   Allergies Allergies  Allergen Reactions  . Fig Extract [Ficus] Anaphylaxis  . Sulfa Antibiotics Other (See Comments)    Severe nervousness.     Home Medications  Prior to Admission medications   Medication Sig Start Date End Date Taking? Authorizing Provider  acetaminophen (TYLENOL) 500 MG tablet Take 500 mg by mouth every 6 (six) hours as needed. For pain.    [provider]  albuterol (PROVENTIL HFA;VENTOLIN HFA) 108 (90 BASE) MCG/ACT inhaler Inhale 2 puffs into the lungs every 6 (six) hours as needed. Patient uses 2 puffs of this medication at 6 am.    [provider]  albuterol (PROVENTIL) (2.5 MG/3ML) 0.083% nebulizer solution Take 2.5 mg by nebulization every 6 (six) hours as needed.    [provider]  ALPRAZolam Duanne Moron) 0.25 MG tablet Take 0.25-0.375 mg by mouth daily as needed for anxiety. 08/20/16   [provider]  aspirin 81 MG chewable tablet Chew 81 mg by mouth daily.    [provider]  benzonatate (TESSALON) 200 MG capsule Take 200 mg by mouth 3 (three) times daily as needed. For cough.    [provider]  calcium carbonate (TITRALAC) 420 MG CHEW Chew 420 mg by mouth daily as needed.    [provider]  docusate sodium (COLACE) 100 MG capsule Take 100 mg by mouth 2 (two) times daily.    [provider]  famotidine (PEPCID) 20 MG tablet Take 1 tablet (20 mg total) by mouth 2 (two) times daily. Patient not taking: Reported on 09/16/2016 04/28/12 09/16/16  Rhunette Croft, MD   Fluticasone-Salmeterol (ADVAIR) 250-50 MCG/DOSE AEPB Inhale 1 puff into the lungs every 12 (twelve) hours.    [provider]  HYDROcodone-acetaminophen (NORCO/VICODIN) 5-325 MG tablet Take 1-2 tablets by mouth every 6 (six) hours as needed for moderate pain or severe pain. 09/19/16   Ghimire, Henreitta Leber, MD  lisinopril-hydrochlorothiazide (PRINZIDE,ZESTORETIC) 20-12.5 MG tablet Take 0.5 tablets by mouth daily. 09/21/16   Ghimire, Henreitta Leber, MD  loratadine (CLARITIN) 10 MG tablet Take 10 mg by mouth daily.    [provider]  ondansetron (ZOFRAN) 4 MG tablet Take 4 mg by mouth every 8 (eight) hours as  needed.    [provider]  phenazopyridine (PYRIDIUM) 200 MG tablet Take 1 tablet (200 mg total) by mouth 3 (three) times daily. 12/04/16   Dorie Rank, MD  rosuvastatin (CRESTOR) 20 MG tablet Take 20 mg by mouth daily.    [provider]  tiotropium (SPIRIVA HANDIHALER) 18 MCG inhalation capsule Place 18 mcg into inhaler and inhale daily.    [provider]  zolpidem (AMBIEN) 10 MG tablet Take 10 mg by mouth at bedtime as needed for sleep.    [provider]     Critical care time: 50 minute     Georgann Housekeeper, AGACNP-BC [place]  See Amion for personal pager PCCM on call pager [phone] until 7pm. Please call El[facility] 7p-7a. [phone]  11/05/2020 10:54 AM

## 2020-11-05 NOTE — Progress Notes (Signed)
eLink Physician-Brief Progress Note Patient Name: Tracy Buck DOB: 03-May-1935 MRN: [id number]   Date of Service  11/05/2020  HPI/Events of Note  Bedside RN asking for patient to have oral medications with sips of water when she is off BIPAP. She's been NPO due to being on BIPAP.  eICU Interventions  Nursing communication order entered allowing medications with sips of water while off BIPAP, NPO while on BIPAP. KCL 20 meq po x 1, d/c last 20 meq of iv KCL.        Kerry Kass Ishmel Acevedo 11/05/2020, 8:05 PM

## 2020-11-05 NOTE — ED Notes (Signed)
Placed patient on BiPAP per MD. 12/6 and 30%. Patient very anxious at first, but now tolerating well.

## 2020-11-05 NOTE — ED Notes (Signed)
Unable to obtain 2nd blood culture, Dr. Billy Fischer aware

## 2020-11-05 NOTE — Progress Notes (Signed)
ANTICOAGULATION CONSULT NOTE - Initial Consult  Pharmacy Consult for heparin Indication: atrial fibrillation  Allergies  Allergen Reactions  . Fig Extract [Ficus] Anaphylaxis  . Sulfa Antibiotics Other (See Comments)    Severe nervousness.    Patient Measurements: Height: 5\' 3"  (160 cm) Weight: 78.2 kg (172 lb 6.4 oz) IBW/kg (Calculated) : 52.4 Heparin Dosing Weight: 68.7kg  Vital Signs: Temp: 98.9 F (37.2 C) (03/16 1000) Temp Source: Axillary (03/16 1000) BP: 96/66 (03/16 1031) Pulse Rate: 71 (03/16 1031)  Labs: Recent Labs    11/05/20 0649 11/05/20 0650 11/05/20 0726 11/05/20 0730  HGB 12.7  --   --  12.9  HCT 38.6  --   --  38.0  PLT 161  --   --   --   APTT  --   --  32  --   LABPROT  --   --  15.6*  --   INR  --   --  1.3*  --   CREATININE 1.33*  --   --   --   TROPONINIHS  --  1,716*  --   --     Estimated Creatinine Clearance: 31.7 mL/min (A) (by C-G formula based on SCr of 1.33 mg/dL (H)).   Medical History: Past Medical History:  Diagnosis Date  . Arthritis    "fingers, knees" (09/16/2016)  . Complication of anesthesia    "I often go into severe panic attacks when they try to bring me out and they have to put me back under; sometimes happens when they are starting the anesthesia also" (09/16/2016)  . COPD (chronic obstructive pulmonary disease) ([place])    "I think this has been recently dx'd" (09/16/2016)  . Heart valve replaced   . Hyperlipidemia   . Hypertension   . LBBB (left bundle branch block)   . Lung cancer ([place]) 2013  . Lung mass   . Myocardial infarct ([place])   . Panic attack   . Pneumonia    "when I was very young" (09/16/2016)  . Rib fracture   . Skin cancer of nose    Assessment: 51 YOF presenting with SOB, in afib with RVR in ED which is new, she is not on anticoagulation PTA.  CBC wnl.    Goal of Therapy:  Heparin level 0.3-0.7 units/ml Monitor platelets by anticoagulation protocol: Yes   Plan: Heparin 3500 units IV x 1, and  gtt at 950 units/hr F/u 8 hour heparin level Daily heparin level, CBC, s/s bleeding F/u cards recs  Bertis Ruddy, PharmD Clinical Pharmacist Please check AMION for all [place] numbers 11/05/2020 11:02 AM

## 2020-11-05 NOTE — Sepsis Progress Note (Signed)
Notified provider of need to order repeat lactic acid. ° °

## 2020-11-05 NOTE — ED Provider Notes (Signed)
[facility] EMERGENCY DEPARTMENT Provider Note   CSN: [id number] Arrival date & time: 11/05/20  4098     History Chief Complaint  Patient presents with  . Shortness of Breath    Tracy Buck is a 86 y.o. female.  HPI      86 year old female with a history of hypertension, hyperlipidemia, COPD, LBBB, lung cancer with history of lobectomy, TAVR 04/2020, nonobstructive CAD, presents with concern for fatigue for 1.5 weeks and dyspnea beginning this morning.  Daughter reports she has had severe fatigue and sleepiness over the last 1.5 weeks, prior to that was very active, working on crafts, ambulatory but has not been herself for the last 10 days. She saw her PCP and Cardiologist, had labwork done with PCP and they have stopped xanax and reduced ambient o 5mg  nightly.  Had COVID and flu testing which were negative including PCR.  She has had severe anxiety over the weekend "panic attacks" since these medication changes.  This AM, developed severe shortness of breath and chest tightness.  No specific orthopnea, leg swelling or pain, no recent travel/surgeries/immobilization.  Denies known fevers at home but had been having sweats and hot and cold.  No urinary symptoms, nausea, vomiting, diarrhea, black or bloody stools, sore throat, rash.  Has chronic cough which is unchanged.   Past Medical History:  Diagnosis Date  . Arthritis    "fingers, knees" (09/16/2016)  . Complication of anesthesia    "I often go into severe panic attacks when they try to bring me out and they have to put me back under; sometimes happens when they are starting the anesthesia also" (09/16/2016)  . COPD (chronic obstructive pulmonary disease) ([place])    "I think this has been recently dx'd" (09/16/2016)  . Heart valve replaced   . Hyperlipidemia   . Hypertension   . LBBB (left bundle branch block)   . Lung cancer ([place]) 2013  . Lung mass   . Myocardial infarct ([place])   . Panic attack   . Pneumonia    "when  I was very young" (09/16/2016)  . Rib fracture   . Skin cancer of nose     Patient Active Problem List   Diagnosis Date Noted  . Cholecystitis 09/16/2016  . S/P AVR 09/16/2016  . S/P CABG (coronary artery bypass graft) 09/16/2016  . Essential hypertension 09/16/2016  . COPD (chronic obstructive pulmonary disease) ([place]) 09/16/2016  . Hyperglycemia 09/16/2016  . Pre-op evaluation   . Hyperlipidemia     Past Surgical History:  Procedure Laterality Date  . AORTIC VALVE REPLACEMENT     "bovine valve"  . BYPASS GRAFT     does not know if she had CABG (09/16/2016)  . CARDIAC SURGERY     does not know if she had CABG (09/16/2016)  . CARDIAC VALVE REPLACEMENT    . CARPAL TUNNEL RELEASE Right 1959  . CHOLECYSTECTOMY N/A 09/17/2016   Procedure: LAPAROSCOPIC CHOLECYSTECTOMY;  Surgeon: Ralene Ok, MD;  Location: [facility];  Service: General;  Laterality: N/A;  . [place]  . KNEE ARTHROSCOPY Left 09/2014  . LOBECTOMY Left 2013   "took bottom lobe off"  . SKIN CANCER EXCISION     "tip of my nose"  . TONSILLECTOMY       OB History   No obstetric history on file.     Family History  Problem Relation Age of Onset  . Transient ischemic attack Mother   . Colon cancer Father  Social History   Tobacco Use  . Smoking status: Never Smoker  . Smokeless tobacco: Never Used  Substance Use Topics  . Alcohol use: Yes    Comment: 09/16/2016 "2-3 glasses of wine when at the beach; once/year"  . Drug use: No    Home Medications Prior to Admission medications   Medication Sig Start Date End Date Taking? Authorizing Provider  acetaminophen (TYLENOL) 500 MG tablet Take 500 mg by mouth every 6 (six) hours as needed. For pain.    [provider]  albuterol (PROVENTIL HFA;VENTOLIN HFA) 108 (90 BASE) MCG/ACT inhaler Inhale 2 puffs into the lungs every 6 (six) hours as needed. Patient uses 2 puffs of this medication at 6 am.    [provider]   albuterol (PROVENTIL) (2.5 MG/3ML) 0.083% nebulizer solution Take 2.5 mg by nebulization every 6 (six) hours as needed.    [provider]  ALPRAZolam Duanne Moron) 0.25 MG tablet Take 0.25-0.375 mg by mouth daily as needed for anxiety. 08/20/16   [provider]  aspirin 81 MG chewable tablet Chew 81 mg by mouth daily.    [provider]  benzonatate (TESSALON) 200 MG capsule Take 200 mg by mouth 3 (three) times daily as needed. For cough.    [provider]  calcium carbonate (TITRALAC) 420 MG CHEW Chew 420 mg by mouth daily as needed.    [provider]  docusate sodium (COLACE) 100 MG capsule Take 100 mg by mouth 2 (two) times daily.    [provider]  famotidine (PEPCID) 20 MG tablet Take 1 tablet (20 mg total) by mouth 2 (two) times daily. Patient not taking: Reported on 09/16/2016 04/28/12 09/16/16  Rhunette Croft, MD  Fluticasone-Salmeterol (ADVAIR) 250-50 MCG/DOSE AEPB Inhale 1 puff into the lungs every 12 (twelve) hours.    [provider]  HYDROcodone-acetaminophen (NORCO/VICODIN) 5-325 MG tablet Take 1-2 tablets by mouth every 6 (six) hours as needed for moderate pain or severe pain. 09/19/16   Ghimire, Henreitta Leber, MD  lisinopril-hydrochlorothiazide (PRINZIDE,ZESTORETIC) 20-12.5 MG tablet Take 0.5 tablets by mouth daily. 09/21/16   Ghimire, Henreitta Leber, MD  loratadine (CLARITIN) 10 MG tablet Take 10 mg by mouth daily.    [provider]  ondansetron (ZOFRAN) 4 MG tablet Take 4 mg by mouth every 8 (eight) hours as needed.    [provider]  phenazopyridine (PYRIDIUM) 200 MG tablet Take 1 tablet (200 mg total) by mouth 3 (three) times daily. 12/04/16   Dorie Rank, MD  rosuvastatin (CRESTOR) 20 MG tablet Take 20 mg by mouth daily.    [provider]  tiotropium (SPIRIVA HANDIHALER) 18 MCG inhalation capsule Place 18 mcg into inhaler and inhale daily.    [provider]  zolpidem (AMBIEN) 10 MG tablet Take  10 mg by mouth at bedtime as needed for sleep.    [provider]    Allergies    Fig extract [ficus] and Sulfa antibiotics  Review of Systems   Review of Systems  Unable to perform ROS: Acuity of condition  Constitutional: Positive for activity change, appetite change, diaphoresis and fatigue. Negative for fever (temp 100 on arrival to ED).  HENT: Negative for sore throat.   Eyes: Negative for visual disturbance.  Respiratory: Positive for cough (chronic) and shortness of breath.   Cardiovascular: Positive for chest pain. Negative for leg swelling.  Gastrointestinal: Negative for abdominal pain, nausea and vomiting.  Genitourinary: Negative for difficulty urinating and dysuria.  Musculoskeletal: Negative for back pain  and neck pain.  Skin: Negative for rash.  Neurological: Negative for syncope and headaches.    Physical Exam Updated Vital Signs BP (!) 93/53   Pulse (!) 50   Temp (!) 102.8 F (39.3 C)   Resp (!) 27   Ht 5\' 3"  (1.6 m)   Wt 76.2 kg   SpO2 98%   BMI 29.78 kg/m   Physical Exam Vitals and nursing note reviewed.  Constitutional:      General: She is in acute distress.     Appearance: She is well-developed. She is ill-appearing and toxic-appearing. She is not diaphoretic.  HENT:     Head: Normocephalic and atraumatic.  Eyes:     Conjunctiva/sclera: Conjunctivae normal.  Cardiovascular:     Rate and Rhythm: Tachycardia present. Rhythm irregular.     Heart sounds: Normal heart sounds. No murmur heard. No friction rub. No gallop.   Pulmonary:     Effort: Pulmonary effort is normal. Tachypnea present. No respiratory distress.     Breath sounds: Wheezing (occasional) and rhonchi (RL) present. No rales.  Abdominal:     General: There is no distension.     Palpations: Abdomen is soft.     Tenderness: There is no abdominal tenderness. There is no guarding.  Musculoskeletal:        General: No tenderness.     Cervical back: Normal range of motion.      Right lower leg: No edema.     Left lower leg: No edema.  Skin:    General: Skin is warm and dry.     Findings: No erythema or rash.  Neurological:     Mental Status: She is oriented to person, place, and time.     ED Results / Procedures / Treatments   Labs (all labs ordered are listed, but only abnormal results are displayed) Labs Reviewed  CBC WITH DIFFERENTIAL/PLATELET - Abnormal; Notable for the following components:      Result Value   WBC 20.5 (*)    nRBC 0.3 (*)    Neutro Abs 17.5 (*)    Abs Immature Granulocytes 0.63 (*)    All other components within normal limits  BASIC METABOLIC PANEL - Abnormal; Notable for the following components:   Sodium 129 (*)    CO2 11 (*)    Glucose, Bld 278 (*)    Creatinine, Ser 1.33 (*)    Calcium 8.2 (*)    GFR, Estimated 40 (*)    Anion gap 19 (*)    All other components within normal limits  BRAIN NATRIURETIC PEPTIDE - Abnormal; Notable for the following components:   B Natriuretic Peptide 1,682.9 (*)    All other components within normal limits  LACTIC ACID, PLASMA - Abnormal; Notable for the following components:   Lactic Acid, Venous 8.7 (*)    All other components within normal limits  PROTIME-INR - Abnormal; Notable for the following components:   Prothrombin Time 15.6 (*)    INR 1.3 (*)    All other components within normal limits  HEPATIC FUNCTION PANEL - Abnormal; Notable for the following components:   Total Protein 5.8 (*)    Albumin 2.5 (*)    AST 77 (*)    Alkaline Phosphatase 162 (*)    Total Bilirubin 1.9 (*)    Bilirubin, Direct 0.7 (*)    Indirect Bilirubin 1.2 (*)    All other components within normal limits  I-STAT VENOUS BLOOD GAS, ED - Abnormal; Notable for the following  components:   pCO2, Ven 26.4 (*)    pO2, Ven 53.0 (*)    Bicarbonate 13.7 (*)    TCO2 14 (*)    Acid-base deficit 10.0 (*)    Sodium 131 (*)    Potassium 3.3 (*)    Calcium, Ion 1.11 (*)    All other components within normal  limits  TROPONIN I (HIGH SENSITIVITY) - Abnormal; Notable for the following components:   Troponin I (High Sensitivity) 1,716 (*)    All other components within normal limits  RESP PANEL BY RT-PCR (FLU A&B, COVID) ARPGX2  CULTURE, BLOOD (ROUTINE X 2)  URINE CULTURE  CULTURE, BLOOD (ROUTINE X 2)  APTT  LACTIC ACID, PLASMA  URINALYSIS, ROUTINE W REFLEX MICROSCOPIC  BETA-HYDROXYBUTYRIC ACID  TROPONIN I (HIGH SENSITIVITY)    EKG EKG Interpretation  Date/Time:  Wednesday November 05 2020 06:55:12 EDT Ventricular Rate:  99 PR Interval:    QRS Duration: 141 QT Interval:  320 QTC Calculation: 411 R Axis:   65 Text Interpretation: Atrial fibrillation Left bundle branch block Since prior ECG, rate has slowed Confirmed by Gareth Morgan [id number]) on 11/05/2020 7:19:11 AM   Radiology DG Chest Port 1 View  Result Date: 11/05/2020 CLINICAL DATA:  Questionable sepsis EXAM: PORTABLE CHEST 1 VIEW COMPARISON:  09/16/2016 FINDINGS: Normal heart size and mediastinal contours. Aortic valve replacement with interval revision. Indistinct density at the bases with small pleural effusion seen laterally on the left. No air bronchogram. No pneumothorax. IMPRESSION: 1. Small left pleural effusion. 2. Hazy density at the bases could be atelectasis or mild infiltrate. Electronically Signed   By: Monte Fantasia M.D.   On: 11/05/2020 07:51    Procedures .Critical Care Performed by: Gareth Morgan, MD Authorized by: Gareth Morgan, MD   Critical care provider statement:    Critical care time (minutes):  80   Critical care was time spent personally by me on the following activities:  Discussions with consultants, evaluation of patient's response to treatment, examination of patient, ordering and performing treatments and interventions, ordering and review of laboratory studies, ordering and review of radiographic studies, pulse oximetry, re-evaluation of patient's condition, obtaining history from patient or  surrogate and review of old charts     Medications Ordered in ED Medications  0.9 %  sodium chloride infusion ( Intravenous New Bag/Given 11/05/20 0703)  lactated ringers infusion (has no administration in time range)  lactated ringers bolus 1,000 mL (1,000 mLs Intravenous New Bag/Given 11/05/20 0746)  metroNIDAZOLE (FLAGYL) IVPB 500 mg (500 mg Intravenous New Bag/Given 11/05/20 0751)  vancomycin (VANCOCIN) IVPB 1000 mg/200 mL premix (1,000 mg Intravenous New Bag/Given 11/05/20 0814)  ceFEPIme (MAXIPIME) 2 g in sodium chloride 0.9 % 100 mL IVPB (has no administration in time range)  vancomycin (VANCOREADY) IVPB 750 mg/150 mL (has no administration in time range)  lactated ringers bolus 1,000 mL (has no administration in time range)  lactated ringers bolus 250 mL (has no administration in time range)  LORazepam (ATIVAN) injection 1 mg (1 mg Intravenous Given 11/05/20 0655)  diltiazem (CARDIZEM) injection 20 mg (20 mg Intravenous Given 11/05/20 0652)  ceFEPIme (MAXIPIME) 2 g in sodium chloride 0.9 % 100 mL IVPB (0 g Intravenous Stopped 11/05/20 0814)  fentaNYL (SUBLIMAZE) injection 25 mcg (25 mcg Intravenous Given 11/05/20 0740)  acetaminophen (TYLENOL) suppository 650 mg (650 mg Rectal Given 11/05/20 0802)    ED Course  I have reviewed the triage vital signs and the nursing notes.  Pertinent labs & imaging results that  were available during my care of the patient were reviewed by me and considered in my medical decision making (see chart for details).    MDM Rules/Calculators/A&P                          86 year old female with a history of hypertension, hyperlipidemia, COPD, LBBB, lung cancer with history of lobectomy, TAVR 04/2020, nonobstructive CAD, presents with concern for fatigue for 1.5 weeks and dyspnea beginning this morning.  Arrived tachycardic, tachypneic with wide complex tachycardia and appearance of atrial fibrillation with underlying LBBB.  She was placed on diltiazem gtt and  given ativan by Dr. Florina Ou.   Temperature elevated to 100.0 orally, WBC 20000 concerning for possible infection. Ordered blood cultures, lactic acid, 1L LR and empiric abx however did not order full 30cc/kg given initial concern for other contributors to presentation such as possible CHF/afib with RVR.  However, given concern for other underlying infection/sepsis as etiology of tachycardia discontinued diltiazem gtt.   Troponin elevated to 1700, BNP 1682.  Consider primary cardiac etiology however suspect type 2 NSTEMI in response to sepsis given history of fatigue, fever, elevated WBC with renal failure and elevated HR contributing.  Will defer cardiology consult to PCCM team in setting of sepsis.   Labs significant also for bicarb 11, lactic acid of 8. VBG shows pH 7.3 with pCO2 of 25.3.   Placed on bipap to help with work of breathing.   BP dropped to 34V systolic. Given LR to total 30cc/kg.  Will admit to PCCM for sepsis, severe lactic acidosis, tachypnea, clinical course.  Do not feel she requires intubation at this time as she is maintaining respiratory rate and tolerating bipap.  She has paperwork stating both daughters share POA and patient stated she was DNR but only if she were "brain dead" and when daughter questioned her more regarding her wishes at the bedside Tracy Buck now states she is FULL CODE.       Final Clinical Impression(s) / ED Diagnoses Final diagnoses:  Sepsis with acute renal failure without septic shock, due to unspecified organism, unspecified acute renal failure type (HCC)  Lactic acidosis  Elevated troponin  Atrial fibrillation with RVR (HCC)  Acute renal failure, unspecified acute renal failure type ([place])  Metabolic acidosis  Acute respiratory failure with hypoxia [employer])    Rx / DC Orders ED Discharge Orders    None       Gareth Morgan, MD 11/06/20 0840

## 2020-11-05 NOTE — Sepsis Progress Note (Signed)
eLink is monitoring this Code Sepsis. °

## 2020-11-05 NOTE — Progress Notes (Signed)
RT NOTE: patient taken off of bipap and placed on 5L [place].  Currently tolerating well.  Will continue to monitor.

## 2020-11-05 NOTE — ED Notes (Signed)
Patient was placed 4L [place] due to decrease in oxygen sturations. Post [place] oxygen saturation is 94%. Patient tolerating well.

## 2020-11-05 NOTE — ED Provider Notes (Signed)
Called to room at 6:50 AM  Patient came in with shortness of breath and anxiety.  Patient anxious, tachypneic and tachycardic.  Skin cool and clammy.  EKG showed wide-complex tachycardia but with PVCs.  Review of old EKGs confirm the patient does have an existing left bundle branch block, so I interpreted this to be A. fib with RVR and aberrancy. The patient was given 1 mg of Ativan IV to calm her anxiety and given 20 mg of Cardizem IV with improvement in her rate.  She was then was started on a titratable Cardizem drip.  She continued to have frequent PVCs once the rate was slowed.  Dr. Billy Fischer, the morning EDP, then arrived and took over care of the patient and will perform the formal H&P.       Shanon Rosser, MD 11/05/20 [id number]

## 2020-11-05 NOTE — Progress Notes (Signed)
Transported to CT scan and back without complications.

## 2020-11-05 NOTE — Consult Note (Addendum)
Cardiology Consultation:   Patient ID: Tracy Buck MRN: [id number]; DOB: [birth year]  Admit date: 11/05/2020 Date of Consult: 11/05/2020  PCP:  Drake Leach, MD   [place]  Cardiologist:  No primary care provider on file. New  Followed thru [facility] Dr. Bishop Limbo  In [place] Provider:  No care team member to display Electrophysiologist:  None        Patient Profile:   Tracy Buck is a 86 y.o. female with a hx of AS and hx of then bioprosthetic AV stenosis, TAVR 05/01/20,  Chronic lung disease, with lt lower lobe lobectomy, due to malignant carcinoid, CAD non obstructive on cardiac cath 03/2020, HTN, HLD who is being seen today for the evaluation of SOB at the request of Dr. Tamala Julian.  History of Present Illness:   Tracy Buck with hx of bioprosthetic aortic valve placed of stenosis in 2007 a 21 mm magna valve which was also with stenosis and pt had TAVR 05/01/2020 with a 23 mm CoreValve and post TAVR fracking with good result.  Her follow-up echo looked good with EF 55-60% no aortic regurg. Mean gradient of 15 mmHg.  Was seen by Dr. Quillian Quince 10/29/20 and had soft early systolic heart murmur, no diastolic murmurs.  She did complain of fatigue, abrupt fatigue.  She had been having some sweats.  He was mildly concerned of endocarditis, but unlikely.  She is on ASA 81 mg daily. And understands she will need lifelong SBE prophylaxis.  HR then was 85.  BP 97/66 next day pulse was same but BP 118/75  CAD non obstructive on cath 03/28/2020.        Now admitted after presentation with atrial fib RVR LBBB, SOB, anxious she was given 1 mg ativan and 20 mg dilt with improved HR.  Placed on dilt drip.  Her sp02 decreased and 02 placed and eventual Bipap  Her lactate was up resp 29. Despite improved HR.  Dx with sepsis. Question of dilated uretal duct on [place], ua unremakable.    EKG:  The EKG was personally reviewed and demonstrates:  A fib with LBBB HR 159  Follow up  atrial fib with HR controlled chronic LBBB was in SR 05/2020 Telemetry:  Telemetry was personally reviewed and demonstrates:  Atrial fib with PVCs   Na 127, K+ 3.1 Cr 1.20 ca+ 7.8 ASAT 83, ALT 52  Troponin  1716, 1693 BNP 1682 WBC 20.5, Hgb 12.7 plts 161  INR 1.3  Blood culture, urine culture pending.   Currently family in room, no chest pain some upper abd discomfort.    CT renal stone and chest pending.  Echo pending  Past Medical History:  Diagnosis Date   Arthritis    "fingers, knees" (2/48/2500)   Complication of anesthesia    "I often go into severe panic attacks when they try to bring me out and they have to put me back under; sometimes happens when they are starting the anesthesia also" (09/16/2016)   COPD (chronic obstructive pulmonary disease) ([place])    "I think this has been recently dx'd" (09/16/2016)   Heart valve replaced    Hyperlipidemia    Hypertension    LBBB (left bundle branch block)    Lung cancer ([place]) 2013   Lung mass    Myocardial infarct [facility])    Panic attack    Pneumonia    "when I was very young" (09/16/2016)   Rib fracture    Skin cancer of nose  Past Surgical History:  Procedure Laterality Date   AORTIC VALVE REPLACEMENT     "bovine valve"   BYPASS GRAFT     does not know if she had CABG (09/16/2016)   CARDIAC SURGERY     does not know if she had CABG (09/16/2016)   CARDIAC VALVE REPLACEMENT     CARPAL TUNNEL RELEASE Right 1959   CHOLECYSTECTOMY N/A 09/17/2016   Procedure: LAPAROSCOPIC CHOLECYSTECTOMY;  Surgeon: Ralene Ok, MD;  Location: [place];  Service: General;  Laterality: N/A;   [place] ARTHROSCOPY Left 09/2014   LOBECTOMY Left 2013   "took bottom lobe off"   SKIN CANCER EXCISION     "tip of my nose"   TONSILLECTOMY       Home Medications:  Prior to Admission medications   Medication Sig Start Date End Date Taking? Authorizing Provider  acetaminophen (TYLENOL)  500 MG tablet Take 500 mg by mouth every 6 (six) hours as needed. For pain.    [provider]  albuterol (PROVENTIL HFA;VENTOLIN HFA) 108 (90 BASE) MCG/ACT inhaler Inhale 2 puffs into the lungs every 6 (six) hours as needed. Patient uses 2 puffs of this medication at 6 am.    [provider]  albuterol (PROVENTIL) (2.5 MG/3ML) 0.083% nebulizer solution Take 2.5 mg by nebulization every 6 (six) hours as needed.    [provider]  aspirin 81 MG chewable tablet Chew 81 mg by mouth daily.    [provider]  benzonatate (TESSALON) 200 MG capsule Take 200 mg by mouth 3 (three) times daily as needed. For cough.    [provider]  calcium carbonate (TITRALAC) 420 MG CHEW Chew 420 mg by mouth daily as needed.    [provider]  docusate sodium (COLACE) 100 MG capsule Take 100 mg by mouth 2 (two) times daily.    [provider]  Fluticasone-Salmeterol (ADVAIR) 250-50 MCG/DOSE AEPB Inhale 1 puff into the lungs every 12 (twelve) hours.    [provider]  lisinopril-hydrochlorothiazide (PRINZIDE,ZESTORETIC) 20-12.5 MG tablet Take 0.5 tablets by mouth daily. 09/21/16   Ghimire, Henreitta Leber, MD  loratadine (CLARITIN) 10 MG tablet Take 10 mg by mouth daily.    [provider]  ondansetron (ZOFRAN) 4 MG tablet Take 4 mg by mouth every 8 (eight) hours as needed.    [provider]  rosuvastatin (CRESTOR) 20 MG tablet Take 20 mg by mouth daily.    [provider]  tiotropium (SPIRIVA) 18 MCG inhalation capsule Place 18 mcg into inhaler and inhale daily.    [provider]  zolpidem (AMBIEN) 10 MG tablet Take 5 mg by mouth at bedtime as needed for sleep.    [provider]    Inpatient Medications: Scheduled Meds:  budesonide (PULMICORT) nebulizer solution  0.5 mg Nebulization BID   chlorhexidine  15 mL Mouth Rinse BID   Chlorhexidine Gluconate Cloth  6 each Topical Daily    ipratropium-albuterol  3 mL Nebulization Q6H   mouth rinse  15 mL Mouth Rinse q12n4p   pantoprazole (PROTONIX) IV  40 mg Intravenous QHS   Continuous Infusions:  sodium chloride Stopped (11/05/20 0847)   ceFEPime (MAXIPIME) IV     heparin Stopped (11/05/20 1348)   lactated ringers Stopped (11/05/20 1428)   potassium chloride 10 mEq (11/05/20 1601)    sodium bicarbonate (isotonic) infusion in sterile water 100 mL/hr at 11/05/20 1600   [START ON 11/06/2020] vancomycin  PRN Meds: sodium chloride, docusate sodium, polyethylene glycol  Allergies:    Allergies  Allergen Reactions   Fig Extract [Ficus] Anaphylaxis   Sulfa Antibiotics Other (See Comments)    Severe nervousness.    Social History:   Social History   Socioeconomic History   Marital status: Widowed    Spouse name: Not on file   Number of children: Not on file   Years of education: Not on file   Highest education level: Not on file  Occupational History   Not on file  Tobacco Use   Smoking status: Never Smoker   Smokeless tobacco: Never Used  Substance and Sexual Activity   Alcohol use: Yes    Comment: 09/16/2016 "2-3 glasses of wine when at the beach; once/year"   Drug use: No   Sexual activity: Not Currently    Birth control/protection: Abstinence  Other Topics Concern   Not on file  Social History Narrative   Not on file   Social Determinants of Health   Financial Resource Strain: Not on file  Food Insecurity: Not on file  Transportation Needs: Not on file  Physical Activity: Not on file  Stress: Not on file  Social Connections: Not on file  Intimate Partner Violence: Not on file    Family History:    Family History  Problem Relation Age of Onset   Transient ischemic attack Mother    Colon cancer Father      ROS:  Please see the history of present illness.  General:no colds or fevers, no weight changes Skin:no rashes or ulcers HEENT:no blurred vision, no  congestion CV:see HPI PUL:see HPI GI:no diarrhea constipation or melena, no indigestion GU:no hematuria, no dysuria MS:no joint pain, no claudication Neuro:no syncope, no lightheadedness Endo:no diabetes, no thyroid disease  All other ROS reviewed and negative.     Physical Exam/Data:   Vitals:   11/05/20 1500 11/05/20 1530 11/05/20 1600 11/05/20 1630  BP: 110/76 111/72 114/76 119/87  Pulse: 80 78 88 84  Resp: 17 (!) 30 (!) 28 (!) 29  Temp:      TempSrc:      SpO2: 100% 100% 100% 100%  Weight:      Height:        Intake/Output Summary (Last 24 hours) at 11/05/2020 1639 Last data filed at 11/05/2020 1600 Gross per 24 hour  Intake 3133.89 ml  Output 17 ml  Net 3116.89 ml   Last 3 Weights 11/05/2020 11/05/2020 11/05/2020  Weight (lbs) 172 lb 6.4 oz 168 lb 1.6 oz 168 lb 1.6 oz  Weight (kg) 78.2 kg 76.25 kg 76.25 kg     Body mass index is 30.54 kg/m.  General:  Well nourished, well developed, in no acute distress on Bipap,  HEENT: normal Lymph: no adenopathy Neck: no JVD Endocrine:  No thryomegaly Vascular: No carotid bruits; FA pulses 2+ bilaterally without bruits  Cardiac:  normal S1, S2; RRR; 1/6 SEM   Lungs:  clear to auscultation bilaterally, no wheezing, rhonchi or rales  Abd: soft, nontender, no hepatomegaly  Ext: no edema Musculoskeletal:  No deformities, BUE and BLE strength normal and equal Skin: warm and dry  Neuro:  Alert and oriented X 3, MAE follows commands no focal abnormalities noted Psych:  Normal affect     Relevant CV Studies: Echo 05/30/20  SUMMARY  Left ventricular systolic function is normal.  LV ejection fraction = 55-60%.  Abnormal (paradoxical) septal motion consistent with LBBB.  Left ventricular filling pattern is prolonged relaxation.  The right ventricular systolic function is normal.  The left atrial volume is mildly increased.  Status post TAVR with Edwards Sapien prosthesis  There is mild aortic stenosis.  Aortic valve mean  pressure gradient is 15 mmHg.  There isno aortic regurgitation.  There is mild mitral regurgitation.  There is mild tricuspid regurgitation.  Mild pulmonary hypertension.  Estimated right ventricular systolic pressure is 16-96 mmHg.   -  FINDINGS:  LEFT VENTRICLE  The left ventricular size is normal. Upper septal hypertrophy (sigmoid  septum), normal variant. Left ventricular systolic function is normal.  LV ejection fraction = 55-60%. Left ventricular filling pattern is  prolonged relaxation. Mitral inflow deceleration timeNormal>150 msec.  Abnormal (paradoxical) septal motion consistent with LBBB.   LV WALL MOTION   -  RIGHT VENTRICLE  The right ventricle is normal size. The right ventricular systolic  function is normal.   LEFT ATRIUM  The left atrial volume is mildly increased. LA vol index: 37.4 ml/m2.  The left atrium is mildly dilated.   RIGHT ATRIUM  Right atrial size is normal. There is no Doppler evidence for a patent  foramen ovale.  -  AORTIC VALVE  Status post TAVR (23 mm CoreValve). Stable TAVR with a mean pressure  gradient of 15 mmHg. There is no paravalvular regurgitation. Aortic  valve mean pressure gradient is 15 mmHg. There is mild aortic  stenosis. There is no aortic regurgitation. Status post TAVR with  Edwards Sapien prosthesis.  -  MITRAL VALVE  There is mildmitral annular calcification. The mitral valve leaflets  are pliable and mobile. There is no mitral stenosis. There is mild  mitral regurgitation.  -  TRICUSPID VALVE  Structurally normal tricuspid valve. There is no tricuspid stenosis.  There is mild tricuspid regurgitation. Estimated right ventricular  systolic pressure is 78-93 mmHg. Mild pulmonary hypertension.  -  PULMONIC VALVE  Structurally normal pulmonic valve. There is no pulmonic valvular  stenosis. Trace pulmonic valvular regurgitation.  -  ARTERIES  The aortic sinus is normal size. The ascending aorta is normal size.  -   VENOUS  Pulmonary venous flow pattern is blunted. IVC size was normal.  -  EFFUSION  There is no pericardial effusion.  -  Cardiac cath 03/28/20 PROCEDURE: Coronary angiography, Left heart cath, LV gram, Right heart  cath   DIAGNOSTIC FINDINGS:   1. Severe stenosis of the prosthetic aortic valve was previously known.   The AV was not crossed.  2. Mild-moderate, nonobstructive CAD.  3. Normal right atrial and left atrial filling pressures.  4. Normal pulmonary artery pressure, 36/11 mmHg.  5. Reduced cardiac output, 3.1 L/min (cardiac index 1.7).   Left Main: The vessel was visualized by angiography, is large and is angiographically normal.  Left Anterior Descending: Prox LAD to Mid LAD lesion is 35% stenosed. Mid LAD lesion is 30% stenosed. Dist LAD lesion is 30% stenosed. First Diagonal Branch: 1st Diag lesion is 30% stenosed.  Ramus Intermedius: Ramus lesion is 25% stenosed.  Left Circumflex: Prox Cx to Mid Cx lesion is 30% stenosed.  Right Coronary Artery: Prox RCA lesion is 30% stenosed. Mid RCA lesion is 40% stenosed. Dist RCA lesion is 30% stenosed.    COMPLICATIONS: None   RECOMMENDATIONS:   We will proceed with evaluation for aortic valve replacement, likely by  valve-and-valve TAVR.  ECHO pending  Laboratory Data:  High Sensitivity Troponin:   Recent Labs  Lab 11/05/20 0650 11/05/20 1242 11/05/20 1508  TROPONINIHS 1,716* 1,693* 1,404*  Chemistry Recent Labs  Lab 11/05/20 0649 11/05/20 0730 11/05/20 1148  NA 129* 131* 127*  K 3.7 3.3* 3.1*  CL 99  --  101  CO2 11*  --  18*  GLUCOSE 278*  --  176*  BUN 16  --  15  CREATININE 1.33*  --  1.20*  CALCIUM 8.2*  --  7.8*  GFRNONAA 40*  --  45*  ANIONGAP 19*  --  8    Recent Labs  Lab 11/05/20 0649 11/05/20 1148  PROT 5.8* 4.6*  ALBUMIN 2.5* 2.0*  AST 77* 83*  ALT 41 52*  ALKPHOS 162* 139*  BILITOT 1.9* 2.8*   Hematology Recent Labs  Lab 11/05/20 0649 11/05/20 0730 11/05/20 1148   WBC 20.5*  --  26.2*  RBC 4.25  --  3.55*  HGB 12.7 12.9 10.7*  HCT 38.6 38.0 30.9*  MCV 90.8  --  87.0  MCH 29.9  --  30.1  MCHC 32.9  --  34.6  RDW 14.6  --  14.3  PLT 161  --  108*   BNP Recent Labs  Lab 11/05/20 0650  BNP 1,682.9*    DDimer No results for input(s): DDIMER in the last 168 hours.   Radiology/Studies:  DG Chest Port 1 View  Result Date: 11/05/2020 CLINICAL DATA:  Questionable sepsis EXAM: PORTABLE CHEST 1 VIEW COMPARISON:  09/16/2016 FINDINGS: Normal heart size and mediastinal contours. Aortic valve replacement with interval revision. Indistinct density at the bases with small pleural effusion seen laterally on the left. No air bronchogram. No pneumothorax. IMPRESSION: 1. Small left pleural effusion. 2. Hazy density at the bases could be atelectasis or mild infiltrate. Electronically Signed   By: Monte Fantasia M.D.   On: 11/05/2020 07:51     Assessment and Plan:   1. Atrial fib with RVR  Now off dilt, appears to be on IV heparin, also getting runs of k+.    Would keep on IV heparin. -   depending on echo may need TEE DCCV.    For now manage rate though with lower BP not on any meds, if HR up then begin amiodarone.    Echo pending  May have gone into Afib with sepsis, unsure how long she has been in, was not aware of rapid HR except SOB. 2. Elevated troponin, demand ischemia - Afib RVR with Hypoxic Resp Failure & ? Sepsis with non obstructed disease. In august 2021. 3. Septic, possible renal stone results of CT of kidneys and chest pending.   4. TAVR 04/2020 prior AVR that had stenosed, original done 2007 -echo pending, would definitely need  TEE anyway if + blood cultures.  5. Hx LLL carcinoid s/p lower lobectomy , melanoma removal this year from nose. 6. Acute hypoxemic resp failure COPD now on Bipap followed by CCM Discussed with Dr. Ellyn Hack and he is seeing pt.   Risk Assessment/Risk Scores:     CHA2DS2-VASc Score = 4  This indicates a 4.8%  annual risk of stroke. The patient's score is based upon: CHF History: Yes HTN History: No Diabetes History: No Stroke History: No Vascular Disease History: No Age Score: 2 Gender Score: 1         For questions or updates, please contact [place] Please consult [email or web address] for contact info under    Signed, Cecilie Kicks, NP  11/05/2020 4:39 PM    ATTENDING ATTESTATION  I have seen, examined and evaluated the patient this PM along with Cecilie Kicks, NP.  After reviewing all the available data and chart, we discussed the patients laboratory, study & physical findings as well as symptoms in detail. I agree with he findings, examination as well as impression recommendations as per our discussion.    Attending adjustments noted in italics.   Tracy Buck appears to be rate controlled now with her atrial fibrillation.  She is not aware of any abnormal heart rates or palpitations.  Chest pain or pressure to suggest an ischemic etiology.  I suspect this is related to her ongoing illness.  At present, no rate control required since she is currently auto rate controlled in the 80s and 90s.  Blood pressures are borderline..  The echocardiogram was not read by the time I saw the patient.  I suspect that troponin is related to demand ischemia in the setting associated with sepsis, hypoxic respiratory failure and A. fib RVR. Not unreasonable given the elevation in troponin to run IV heparin for 72 hours.  This would also be consistent with treating for the atrial fibrillation as well.  We eventually need to convert to [place], but for now probably easier to use IV heparin-I would allow it to be held if procedures are necessary.  If ultimately, she does not go out of A. fib or if her blood work shows positive Promus, she would need TEE.  In this setting we could consider cardioversion.  Otherwise TEE would not necessarily be indicated unless she is symptomatic. Is not currently on amiodarone as  her rate is controlled.  That would be the option where she can go back into a tachycardic response.   We will continue to follow.   Glenetta Hew, M.D., M.S. Interventional Cardiologist     Addendum: Echocardiogram now available:  Status post TAVR (23 mm evolute pro).  Mean gradient slightly higher across prosthetic valve likely due to A. fib RVR and sepsis.  (Degenerative mitral valve) MR is now moderate to severe.  TR is now moderate to severe.  Would have low threshold for TEE if the patient develops bacteremia with prosthetic TAVR valve and MR/TR.  No definitive vegetation seen on TTE.  Elevated gradient probably related to sepsis and A. Fib.  LVEF is 50 to 55%.  Low normal.  No significant 100 mL.  Mild LVH.  Moderate RV dilation and dilated IVC suggestive of elevated RAP.-> likely associate with COPD.    Glenetta Hew, MD

## 2020-11-05 NOTE — Progress Notes (Signed)
Pharmacy Antibiotic Note  Tracy Buck is a 86 y.o. female admitted on 11/05/2020 with sepsis.  Pharmacy was consulted for vancomycin and cefepime dosing. Pt with Tmax 100 and WBC is elevated at 20.5. SCr is above baseline at 1.33. Lactic acid is significantly elevated at 8.7.   Cefepime has been discontinued, and pharmacy now consulted for Zosyn dosing for intraabdominal infection.  Plan: Vanc 1 gm IV x 1 then 750 mg IV Q 24 hrs (estimated vancomycin AUC on this regimen, using Scr 1.33, is 517; goal vancomycin AUC is 400-550) Zosyn 3.375 gm IV Q 8 hrs (extended infusion) Monitor WBC, temp, clinical improvement, cultures, renal function, vancomycin levels as indicated  Height: 5\' 3"  (160 cm) Weight: 78.2 kg (172 lb 6.4 oz) IBW/kg (Calculated) : 52.4  Temp (24hrs), Avg:99.8 F (37.7 C), Min:97.6 F (36.4 C), Max:102.8 F (39.3 C)  Recent Labs  Lab 11/05/20 0649 11/05/20 0650 11/05/20 1148 11/05/20 1508  WBC 20.5*  --  26.2*  --   CREATININE 1.33*  --  1.20*  --   LATICACIDVEN  --  8.7* 2.2* 2.3*    Estimated Creatinine Clearance: 35.2 mL/min (A) (by C-G formula based on SCr of 1.2 mg/dL (H)).    Allergies  Allergen Reactions  . Fig Extract [Ficus] Anaphylaxis  . Sulfa Antibiotics Other (See Comments)    Severe nervousness.    Antimicrobials this admission: Vancomycin 3/16>> Cefepime 3/16 Flagyl x 1 3/16 Zosyn 3/16 >>   Microbiology results: 3/16 COVID, flu A, flu B: all negative 3/16 MRSA PCR: negative 3/16 Urine cx: pending 3/16 BCx X 1: pending  Thank you for allowing pharmacy to be a part of this patient's care.  Gillermina Hu, PharmD, BCPS, [facility] Clinical Pharmacist 11/05/2020 5:12 PM

## 2020-11-05 NOTE — Progress Notes (Signed)
Pharmacy Antibiotic Note  Tracy Buck is a 86 y.o. female admitted on 11/05/2020 with sepsis.  Pharmacy has been consulted for vancomycin and cefepime dosing. Pt with Tmax 100 and WBC is elevated at 20.5. SCr is above baseline at 1.33. Lactic acid is significantly elevated at 8.7.   Plan: Vanc 1gm IV x 1 then 750mg  IV Q24H  Cefepime 2gm IV Q12H F/u renal fxn, C&S, clinical status and peak/trough at SS  Height: 5\' 3"  (160 cm) Weight: 76.2 kg (168 lb 1.6 oz) IBW/kg (Calculated) : 52.4  Temp (24hrs), Avg:100 F (37.8 C), Min:100 F (37.8 C), Max:100 F (37.8 C)  Recent Labs  Lab 11/05/20 0649 11/05/20 0650  WBC 20.5*  --   CREATININE 1.33*  --   LATICACIDVEN  --  8.7*    Estimated Creatinine Clearance: 31.4 mL/min (A) (by C-G formula based on SCr of 1.33 mg/dL (H)).    Allergies  Allergen Reactions  . Fig Extract [Ficus] Anaphylaxis  . Sulfa Antibiotics Other (See Comments)    Severe nervousness.    Antimicrobials this admission: Vanc 3/16>> Cefepime 3/16>> Flagyl x 1 3/16  Dose adjustments this admission: N/A  Microbiology results: Pending  Thank you for allowing pharmacy to be a part of this patient's care.  [place]um[place]ger, Rande Lawman 11/05/2020 7:27 AM

## 2020-11-06 ENCOUNTER — Inpatient Hospital Stay (HOSPITAL_COMMUNITY): Payer: Medicare Other

## 2020-11-06 ENCOUNTER — Telehealth: Payer: Self-pay | Admitting: Acute Care

## 2020-11-06 DIAGNOSIS — A419 Sepsis, unspecified organism: Secondary | ICD-10-CM | POA: Diagnosis not present

## 2020-11-06 DIAGNOSIS — R778 Other specified abnormalities of plasma proteins: Secondary | ICD-10-CM | POA: Diagnosis not present

## 2020-11-06 DIAGNOSIS — I4891 Unspecified atrial fibrillation: Secondary | ICD-10-CM | POA: Diagnosis not present

## 2020-11-06 DIAGNOSIS — J9601 Acute respiratory failure with hypoxia: Secondary | ICD-10-CM | POA: Diagnosis not present

## 2020-11-06 LAB — URINE CULTURE: Culture: NO GROWTH

## 2020-11-06 LAB — CBC
HCT: 30.6 % — ABNORMAL LOW (ref 36.0–46.0)
Hemoglobin: 10.8 g/dL — ABNORMAL LOW (ref 12.0–15.0)
MCH: 30.3 pg (ref 26.0–34.0)
MCHC: 35.3 g/dL (ref 30.0–36.0)
MCV: 86 fL (ref 80.0–100.0)
Platelets: 138 10*3/uL — ABNORMAL LOW (ref 150–400)
RBC: 3.56 MIL/uL — ABNORMAL LOW (ref 3.87–5.11)
RDW: 14.5 % (ref 11.5–15.5)
WBC: 19.1 10*3/uL — ABNORMAL HIGH (ref 4.0–10.5)
nRBC: 0.1 % (ref 0.0–0.2)

## 2020-11-06 LAB — BASIC METABOLIC PANEL
Anion gap: 6 (ref 5–15)
BUN: 12 mg/dL (ref 8–23)
CO2: 24 mmol/L (ref 22–32)
Calcium: 7.7 mg/dL — ABNORMAL LOW (ref 8.9–10.3)
Chloride: 102 mmol/L (ref 98–111)
Creatinine, Ser: 0.93 mg/dL (ref 0.44–1.00)
GFR, Estimated: >60 mL/min (ref >60)
Glucose, Bld: 121 mg/dL — ABNORMAL HIGH (ref 70–99)
Potassium: 4.3 mmol/L (ref 3.5–5.1)
Sodium: 132 mmol/L — ABNORMAL LOW (ref 135–145)

## 2020-11-06 LAB — HEPATIC FUNCTION PANEL
ALT: 45 U/L — ABNORMAL HIGH (ref 0–44)
AST: 58 U/L — ABNORMAL HIGH (ref 15–41)
Albumin: 2 g/dL — ABNORMAL LOW (ref 3.5–5.0)
Alkaline Phosphatase: 130 U/L — ABNORMAL HIGH (ref 38–126)
Bilirubin, Direct: 0.7 mg/dL — ABNORMAL HIGH (ref 0.0–0.2)
Indirect Bilirubin: 1.6 mg/dL — ABNORMAL HIGH (ref 0.3–0.9)
Total Bilirubin: 2.3 mg/dL — ABNORMAL HIGH (ref 0.3–1.2)
Total Protein: 4.9 g/dL — ABNORMAL LOW (ref 6.5–8.1)

## 2020-11-06 LAB — HEPARIN LEVEL (UNFRACTIONATED)
Heparin Unfractionated: <0.1 IU/mL — ABNORMAL LOW (ref 0.30–0.70)
Heparin Unfractionated: <0.1 IU/mL — ABNORMAL LOW (ref 0.30–0.70)

## 2020-11-06 LAB — GLUCOSE, CAPILLARY: Glucose-Capillary: 107 mg/dL — ABNORMAL HIGH (ref 70–99)

## 2020-11-06 LAB — PHOSPHORUS: Phosphorus: 3.5 mg/dL (ref 2.5–4.6)

## 2020-11-06 LAB — MAGNESIUM: Magnesium: 2.2 mg/dL (ref 1.7–2.4)

## 2020-11-06 MED ORDER — APIXABAN 5 MG PO TABS
5.0000 mg | ORAL_TABLET | Freq: Two times a day (BID) | ORAL | Status: DC
  Administered 2020-11-06 – 2020-11-17 (×22): 5 mg via ORAL
  Filled 2020-11-06 (×22): qty 1

## 2020-11-06 MED ORDER — HEPARIN (PORCINE) 25000 UT/250ML-% IV SOLN
1450.0000 [IU]/h | INTRAVENOUS | Status: AC
  Administered 2020-11-06: 1200 [IU]/h via INTRAVENOUS
  Filled 2020-11-06: qty 250

## 2020-11-06 MED ORDER — ASPIRIN EC 81 MG PO TBEC
81.0000 mg | DELAYED_RELEASE_TABLET | Freq: Every day | ORAL | Status: DC
Start: 2020-11-06 — End: 2020-11-15
  Administered 2020-11-06 – 2020-11-15 (×10): 81 mg via ORAL
  Filled 2020-11-06 (×10): qty 1

## 2020-11-06 MED ORDER — APIXABAN 5 MG PO TABS: 5.0000 mg | ORAL_TABLET | Freq: Two times a day (BID) | ORAL | Status: DC

## 2020-11-06 MED ORDER — WARFARIN SODIUM 2.5 MG PO TABS
2.5000 mg | ORAL_TABLET | Freq: Once | ORAL | Status: DC
  Filled 2020-11-06: qty 1

## 2020-11-06 MED ORDER — BOOST / RESOURCE BREEZE PO LIQD CUSTOM
1.0000 | Freq: Three times a day (TID) | ORAL | Status: DC
  Administered 2020-11-06 – 2020-11-13 (×20): 1 via ORAL

## 2020-11-06 MED ORDER — HEPARIN (PORCINE) 25000 UT/250ML-% IV SOLN
1200.0000 [IU]/h | INTRAVENOUS | Status: DC
  Administered 2020-11-06: 1200 [IU]/h via INTRAVENOUS
  Filled 2020-11-06: qty 250

## 2020-11-06 MED ORDER — WARFARIN - PHARMACIST DOSING INPATIENT: Freq: Every day | Status: DC

## 2020-11-06 MED ORDER — LORATADINE 10 MG PO TABS
10.0000 mg | ORAL_TABLET | Freq: Every day | ORAL | Status: DC
  Administered 2020-11-06 – 2020-11-17 (×12): 10 mg via ORAL
  Filled 2020-11-06 (×12): qty 1

## 2020-11-06 MED ORDER — FLUTICASONE FUROATE-VILANTEROL 200-25 MCG/INH IN AEPB
1.0000 | INHALATION_SPRAY | Freq: Every day | RESPIRATORY_TRACT | Status: DC
  Administered 2020-11-09 – 2020-11-17 (×8): 1 via RESPIRATORY_TRACT
  Filled 2020-11-06 (×2): qty 28

## 2020-11-06 MED ORDER — ROSUVASTATIN CALCIUM 20 MG PO TABS
20.0000 mg | ORAL_TABLET | Freq: Every day | ORAL | Status: DC
  Administered 2020-11-06 – 2020-11-17 (×12): 20 mg via ORAL
  Filled 2020-11-06 (×3): qty 1
  Filled 2020-11-06: qty 4
  Filled 2020-11-06 (×5): qty 1
  Filled 2020-11-06: qty 4
  Filled 2020-11-06 (×2): qty 1

## 2020-11-06 MED ORDER — TIOTROPIUM BROMIDE MONOHYDRATE 18 MCG IN CAPS
18.0000 ug | ORAL_CAPSULE | Freq: Every day | RESPIRATORY_TRACT | Status: DC
  Filled 2020-11-06: qty 5

## 2020-11-06 MED ORDER — UMECLIDINIUM BROMIDE 62.5 MCG/INH IN AEPB
1.0000 | INHALATION_SPRAY | Freq: Every day | RESPIRATORY_TRACT | Status: DC
  Administered 2020-11-09 – 2020-11-17 (×8): 1 via RESPIRATORY_TRACT
  Filled 2020-11-06 (×2): qty 7

## 2020-11-06 NOTE — Progress Notes (Signed)
eLink Physician-Brief Progress Note Patient Name: Tracy Buck DOB: [birth year] MRN: [id number]   Date of Service  11/06/2020  HPI/Events of Note  Bicarb gtt has fallen off the MAR. Last  Co2 on a.m. labs yesterday was 18 and recent BNP was 1682.  eICU Interventions  Will keep Bicarb infusion discontinued and check a.m. BMP.        Kerry Kass Jagger Demonte 11/06/2020, 12:16 AM

## 2020-11-06 NOTE — Progress Notes (Addendum)
Progress Note  Patient Name: Tracy Buck Date of Encounter: 11/06/2020  [facility] Cardiologist:  [facility] Dr. Bishop Limbo  In [facility]  Subjective   Breathing improving. No chest pain.  Inpatient Medications    Scheduled Meds: . aspirin EC  81 mg Oral Daily  . chlorhexidine  15 mL Mouth Rinse BID  . Chlorhexidine Gluconate Cloth  6 each Topical Daily  . feeding supplement  1 Container Oral TID BM  . fluticasone furoate-vilanterol  1 puff Inhalation Daily  . loratadine  10 mg Oral Daily  . mouth rinse  15 mL Mouth Rinse q12n4p  . melatonin  3 mg Oral QHS  . rosuvastatin  20 mg Oral Daily  . umeclidinium bromide  1 puff Inhalation Daily   Continuous Infusions: . sodium chloride Stopped (11/05/20 0847)  . heparin 1,200 Units/hr (11/06/20 1000)  . piperacillin-tazobactam (ZOSYN)  IV 12.5 mL/hr at 11/06/20 1000   PRN Meds: sodium chloride, acetaminophen, docusate sodium, fentaNYL (SUBLIMAZE) injection, polyethylene glycol   Vital Signs    Vitals:   11/06/20 0800 11/06/20 0832 11/06/20 0900 11/06/20 1000  BP: 115/67  117/69 97/61  Pulse: (!) 101 98 86 97  Resp: (!) 30 16 (!) 30 (!) 30  Temp:      TempSrc:      SpO2: 97% 97% 98% 99%  Weight:      Height:        Intake/Output Summary (Last 24 hours) at 11/06/2020 1105 Last data filed at 11/06/2020 1000 Gross per 24 hour  Intake 2248.73 ml  Output 1150 ml  Net 1098.73 ml   Last 3 Weights 11/05/2020 11/05/2020 11/05/2020  Weight (lbs) 172 lb 6.4 oz 168 lb 1.6 oz 168 lb 1.6 oz  Weight (kg) 78.2 kg 76.25 kg 76.25 kg      Telemetry    Atrial fibrillation 90-100s - Personally Reviewed  ECG    N/A  Physical Exam   GEN: ill appearing elderly female in no acute distress.   Neck: No JVD Cardiac: RRR, + murmurs, rubs, or gallops.  Respiratory: diminished breath sound at base  GI: Soft, nontender, non-distended  MS: No edema; No deformity. Neuro:  Nonfocal  Psych: Normal affect   Labs    High Sensitivity  Troponin:   Recent Labs  Lab 11/05/20 0650 11/05/20 1242 11/05/20 1508  TROPONINIHS 1,716* 1,693* 1,404*      Chemistry Recent Labs  Lab 11/05/20 0649 11/05/20 0730 11/05/20 1148 11/06/20 0137  NA 129* 131* 127* 132*  K 3.7 3.3* 3.1* 4.3  CL 99  --  101 102  CO2 11*  --  18* 24  GLUCOSE 278*  --  176* 121*  BUN 16  --  15 12  CREATININE 1.33*  --  1.20* 0.93  CALCIUM 8.2*  --  7.8* 7.7*  PROT 5.8*  --  4.6* 4.9*  ALBUMIN 2.5*  --  2.0* 2.0*  AST 77*  --  83* 58*  ALT 41  --  52* 45*  ALKPHOS 162*  --  139* 130*  BILITOT 1.9*  --  2.8* 2.3*  GFRNONAA 40*  --  45* >60  ANIONGAP 19*  --  8 6     Hematology Recent Labs  Lab 11/05/20 0649 11/05/20 0730 11/05/20 1148 11/06/20 0137  WBC 20.5*  --  26.2* 19.1*  RBC 4.25  --  3.55* 3.56*  HGB 12.7 12.9 10.7* 10.8*  HCT 38.6 38.0 30.9* 30.6*  MCV 90.8  --  87.0 86.0  MCH 29.9  --  30.1 30.3  MCHC 32.9  --  34.6 35.3  RDW 14.6  --  14.3 14.5  PLT 161  --  108* 138*    BNP Recent Labs  Lab 11/05/20 0650  BNP 1,682.9*     DDimer No results for input(s): DDIMER in the last 168 hours.   Radiology    CT CHEST WO CONTRAST  Result Date: 11/05/2020 CLINICAL DATA:  86 year old female with history of respiratory failure. Low back pain. EXAM: CT CHEST, ABDOMEN AND PELVIS WITHOUT CONTRAST TECHNIQUE: Multidetector CT imaging of the chest, abdomen and pelvis was performed following the standard protocol without IV contrast. COMPARISON:  Chest CT 03/27/2020. CT the abdomen and pelvis 10/29/2013. FINDINGS: CT CHEST FINDINGS Cardiovascular: Heart size is normal. There is no significant pericardial fluid, thickening or pericardial calcification. There is aortic atherosclerosis, as well as atherosclerosis of the great vessels of the mediastinum and the coronary arteries, including calcified atherosclerotic plaque in the left main, left anterior descending, left circumflex and right coronary arteries. Status post median sternotomy  for CABG. Status post TAVR. Mild calcifications of the mitral annulus. Mediastinum/Nodes: No pathologically enlarged mediastinal or hilar lymph nodes. Please note that accurate exclusion of hilar adenopathy is limited on noncontrast CT scans. Esophagus is unremarkable in appearance. No axillary lymphadenopathy. Lungs/Pleura: Status post left lower lobectomy with compensatory hyperexpansion of the left upper lobe. Several small pulmonary nodules are stable in number and size compared to the prior examination, largest of which is a 7 mm left upper lobe pulmonary nodule (axial image 73 of series 4). No other new suspicious appearing pulmonary nodules or masses are noted. Small bilateral pleural effusions lying dependently with some areas of passive subsegmental atelectasis in the right lower lobe and dependent portion of the left upper lobe. Musculoskeletal: Median sternotomy wires. There are no aggressive appearing lytic or blastic lesions noted in the visualized portions of the skeleton. CT ABDOMEN PELVIS FINDINGS Hepatobiliary: No definite suspicious cystic or solid hepatic lesions are confidently identified on today's noncontrast CT examination. Status post cholecystectomy. Pancreas: No definite pancreatic mass or peripancreatic fluid collections. Peripancreatic inflammatory changes noted lateral to the head of the pancreas (in close association with the duodenum and porta hepatis). Spleen: Unremarkable. Adrenals/Urinary Tract: No calcifications are identified in the collecting system of either kidney, along the course of either ureter, or within the lumen of the urinary bladder. No hydroureteronephrosis. Multiple renal lesions bilaterally, incompletely characterized on today's non-contrast CT examination, but likely to represent cysts, largest of which is exophytic extending off the upper pole of the right kidney measuring 5.5 cm in diameter. Unenhanced appearance of the urinary bladder is normal. Bilateral  adrenal glands are normal in appearance. Stomach/Bowel: Unenhanced appearance of the stomach is normal. Inflammatory changes noted surrounding the duodenum, extending into the adjacent porta hepatis. No pathologic dilatation of small bowel or colon. Numerous colonic diverticulae are noted, without surrounding inflammatory changes to suggest a colonic diverticulitis at this time. Normal appendix. Vascular/Lymphatic: Aortic atherosclerosis. No lymphadenopathy noted in the abdomen or pelvis. Reproductive: Uterus and ovaries are unremarkable in appearance. Other: Trace volume of ascites adjacent to the liver. No pneumoperitoneum. Musculoskeletal: There are no aggressive appearing lytic or blastic lesions noted in the visualized portions of the skeleton. IMPRESSION: 1. Inflammatory changes surrounding the duodenum, and adjacent to the head of the pancreas, with extension into the region of the porta hepatis. Findings are concerning for probable duodenitis. No inflammatory changes are noted adjacent  to the body or tail of the pancreas, but correlation with lipase levels is also recommended. 2. Small bilateral pleural effusions lying dependently with some associated passive subsegmental atelectasis in the dependent portions of the lungs. 3. Trace volume of ascites most evident adjacent to the liver. 4. Colonic diverticulosis without evidence of acute diverticulitis at this time. 5. Aortic atherosclerosis, in addition to left main and 3 vessel coronary artery disease. Status post CABG and TAVR. 6. All previously noted small pulmonary nodules are stable compared to prior examinations dating back to at least 2019, considered benign. 7. Additional incidental findings, as above. Electronically Signed   By: Vinnie Langton M.D.   On: 11/05/2020 16:48   DG Chest Port 1 View  Result Date: 11/06/2020 CLINICAL DATA:  Hypoxia EXAM: PORTABLE CHEST 1 VIEW COMPARISON:  11/05/2020 FINDINGS: Prior CABG and TAVR. Mild cardiomegaly,  vascular congestion. Interstitial prominence throughout the lungs, increasing since prior study, favor interstitial edema. Small left pleural effusion. No pneumothorax or acute bony abnormality. IMPRESSION: Increasing vascular congestion and interstitial prominence, likely interstitial edema. Small left pleural effusion. Electronically Signed   By: Rolm Baptise M.D.   On: 11/06/2020 08:07   DG Chest Port 1 View  Result Date: 11/05/2020 CLINICAL DATA:  Questionable sepsis EXAM: PORTABLE CHEST 1 VIEW COMPARISON:  09/16/2016 FINDINGS: Normal heart size and mediastinal contours. Aortic valve replacement with interval revision. Indistinct density at the bases with small pleural effusion seen laterally on the left. No air bronchogram. No pneumothorax. IMPRESSION: 1. Small left pleural effusion. 2. Hazy density at the bases could be atelectasis or mild infiltrate. Electronically Signed   By: Monte Fantasia M.D.   On: 11/05/2020 07:51   ECHOCARDIOGRAM COMPLETE  Result Date: 11/05/2020    ECHOCARDIOGRAM REPORT   Patient Name:   Tracy Buck Date of Exam: 11/05/2020 Medical Rec #:  [id number]    Height:       63.0 in Accession #:    [id number]   Weight:       172.4 lb Date of Birth:  December 01, 1934    BSA:          1.815 m Patient Age:    48 years     BP:           105/77 mmHg Patient Gender: F            HR:           93 bpm. Exam Location:  Inpatient Procedure: 2D Echo, Cardiac Doppler and Color Doppler Indications:    Abnormal EKG  History:        Patient has prior history of Echocardiogram examinations, most                 recent 09/17/2016. Previous Myocardial Infarction, Prior CABG;                 Arrythmias:Atrial Fibrillation. S/p AVR.                 Aortic Valve: 23 mm CoreValve-Evolut Pro prosthetic, stented                 (TAVR) valve is present in the aortic position. Procedure Date:                 05/01/2020.  Sonographer:    Luisa Hart RDCS Referring Phys: [place]  1. S/p TAVR (23  mm evolout pro). MG slightly higher across prosthesis likely due to Afib with RVR and  sepsis. MR is now moderate to severe. TR is now moderate to severe. Would have low threshold for TEE if the patient develops bacteremia with prosthetic  AVR and change in MR/TR. No definitive vegetation seen on this TTE.  2. 23 mm evolute pro (ViV procedure for prior 21 mm Magna ease) in the aortic position. Vmax 2.8 m/s, MG 21 mmHG, EOA 1.18 cm2, DI 0.28. MG slightly higher than what was reported at [facility] (15 mmHG 05/30/2020), likely due to Afib and sepsis. The AT is slightly prolonged ~114 msec. If bacteremia is present would recommend TEE. The aortic valve has been repaired/replaced. Aortic valve regurgitation is not visualized. There is a 23 mm CoreValve-Evolut Pro prosthetic (TAVR) valve present in the aortic position. Procedure Date: 05/01/2020.  3. Left ventricular ejection fraction, by estimation, is 50 to 55%. The left ventricle has low normal function. The left ventricle has no regional wall motion abnormalities. There is mild concentric left ventricular hypertrophy. Left ventricular diastolic function could not be evaluated.  4. Right ventricular systolic function is moderately reduced. The right ventricular size is mildly enlarged. There is moderately elevated pulmonary artery systolic pressure. The estimated right ventricular systolic pressure is 81.8 mmHg.  5. Right atrial size was mildly dilated.  6. The mitral valve is degenerative. Moderate to severe mitral valve regurgitation. No evidence of mitral stenosis.  7. The tricuspid valve is abnormal. Tricuspid valve regurgitation is moderate to severe.  8. The inferior vena cava is dilated in size with >50% respiratory variability, suggesting right atrial pressure of 8 mmHg. Comparison(s): Changes from prior study are noted. FINDINGS  Left Ventricle: Left ventricular ejection fraction, by estimation, is 50 to 55%. The left ventricle has low normal function. The left  ventricle has no regional wall motion abnormalities. The left ventricular internal cavity size was normal in size. There is mild concentric left ventricular hypertrophy. Left ventricular diastolic function could not be evaluated due to atrial fibrillation. Left ventricular diastolic function could not be evaluated. Right Ventricle: The right ventricular size is mildly enlarged. No increase in right ventricular wall thickness. Right ventricular systolic function is moderately reduced. There is moderately elevated pulmonary artery systolic pressure. The tricuspid regurgitant velocity is 3.27 m/s, and with an assumed right atrial pressure of 8 mmHg, the estimated right ventricular systolic pressure is 56.3 mmHg. Left Atrium: Left atrial size was normal in size. Right Atrium: Right atrial size was mildly dilated. Prominent Crista terminalis. Pericardium: Trivial pericardial effusion is present. Presence of pericardial fat pad. Mitral Valve: The mitral valve is degenerative in appearance. Moderate to severe mitral valve regurgitation. No evidence of mitral valve stenosis. MV peak gradient, 13.4 mmHg. The mean mitral valve gradient is 6.0 mmHg. Tricuspid Valve: The tricuspid valve is abnormal. Tricuspid valve regurgitation is moderate to severe. No evidence of tricuspid stenosis. Aortic Valve: 23 mm evolute pro (ViV procedure for prior 21 mm Magna ease) in the aortic position. Vmax 2.8 m/s, MG 21 mmHG, EOA 1.18 cm2, DI 0.28. MG slightly higher than what was reported at [facility] (15 mmHG 05/30/2020), likely due to Afib and sepsis. The AT is slightly prolonged ~114 msec. If bacteremia is present would recommend TEE. The aortic valve has been repaired/replaced. Aortic valve regurgitation is not visualized. Aortic valve mean gradient measures 21.1 mmHg. Aortic valve peak gradient measures 32.3 mmHg. Aortic valve area, by VTI measures 1.18 cm. There is a 23 mm CoreValve-Evolut Pro prosthetic, stented (TAVR) valve present in the  aortic position. Procedure Date: 05/01/2020. Pulmonic Valve:  The pulmonic valve was grossly normal. Pulmonic valve regurgitation is not visualized. No evidence of pulmonic stenosis. Aorta: The aortic root and ascending aorta are structurally normal, with no evidence of dilitation. Venous: The inferior vena cava is dilated in size with greater than 50% respiratory variability, suggesting right atrial pressure of 8 mmHg. IAS/Shunts: The atrial septum is grossly normal.  LEFT VENTRICLE PLAX 2D LVIDd:         3.90 cm LVIDs:         2.90 cm LV PW:         1.30 cm LV IVS:        1.38 cm LVOT diam:     2.30 cm LV SV:         61 LV SV Index:   34 LVOT Area:     4.15 cm  LV Volumes (MOD) LV vol d, MOD A2C: 39.2 ml LV vol d, MOD A4C: 72.5 ml LV vol s, MOD A2C: 20.1 ml LV vol s, MOD A4C: 31.4 ml LV SV MOD A2C:     19.1 ml LV SV MOD A4C:     72.5 ml LV SV MOD BP:      27.4 ml RIGHT VENTRICLE RV S prime:     7.51 cm/s TAPSE (M-mode): 1.1 cm LEFT ATRIUM             Index       RIGHT ATRIUM           Index LA Vol (A2C):   81.2 ml 44.73 ml/m RA Area:     19.80 cm LA Vol (A4C):   58.1 ml 32.00 ml/m RA Volume:   59.40 ml  32.72 ml/m LA Biplane Vol: 68.5 ml 37.73 ml/m  AORTIC VALVE                    PULMONIC VALVE AV Area (Vmax):    1.22 cm     PV Vmax:       0.80 m/s AV Area (Vmean):   1.20 cm     PV Vmean:      59.400 cm/s AV Area (VTI):     1.18 cm     PV VTI:        0.134 m AV Vmax:           284.16 cm/s  PV Peak grad:  2.6 mmHg AV Vmean:          201.916 cm/s PV Mean grad:  2.0 mmHg AV VTI:            0.517 m AV Peak Grad:      32.3 mmHg AV Mean Grad:      21.1 mmHg LVOT Vmax:         83.31 cm/s LVOT Vmean:        58.377 cm/s LVOT VTI:          0.147 m LVOT/AV VTI ratio: 0.28  AORTA Ao Root diam: 3.50 cm Ao Asc diam:  3.15 cm MITRAL VALVE                 TRICUSPID VALVE MV Peak grad: 13.4 mmHg      TR Peak grad:   42.8 mmHg MV Mean grad: 6.0 mmHg       TR Vmax:        327.00 cm/s MV Vmax:      1.83 m/s MV Vmean:     108.0  cm/s     SHUNTS MR Peak  grad:    125.4 mmHg  Systemic VTI:  0.15 m MR Mean grad:    78.0 mmHg   Systemic Diam: 2.30 cm MR Vmax:         560.00 cm/s MR Vmean:        417.0 cm/s MR PISA:         2.26 cm MR PISA Eff ROA: 9 mm MR PISA Radius:  0.60 cm Eleonore Chiquito MD Electronically signed by Eleonore Chiquito MD Signature Date/Time: 11/05/2020/5:22:55 PM    Final    CT RENAL STONE STUDY  Result Date: 11/05/2020 CLINICAL DATA:  86 year old female with history of respiratory failure. Low back pain. EXAM: CT CHEST, ABDOMEN AND PELVIS WITHOUT CONTRAST TECHNIQUE: Multidetector CT imaging of the chest, abdomen and pelvis was performed following the standard protocol without IV contrast. COMPARISON:  Chest CT 03/27/2020. CT the abdomen and pelvis 10/29/2013. FINDINGS: CT CHEST FINDINGS Cardiovascular: Heart size is normal. There is no significant pericardial fluid, thickening or pericardial calcification. There is aortic atherosclerosis, as well as atherosclerosis of the great vessels of the mediastinum and the coronary arteries, including calcified atherosclerotic plaque in the left main, left anterior descending, left circumflex and right coronary arteries. Status post median sternotomy for CABG. Status post TAVR. Mild calcifications of the mitral annulus. Mediastinum/Nodes: No pathologically enlarged mediastinal or hilar lymph nodes. Please note that accurate exclusion of hilar adenopathy is limited on noncontrast CT scans. Esophagus is unremarkable in appearance. No axillary lymphadenopathy. Lungs/Pleura: Status post left lower lobectomy with compensatory hyperexpansion of the left upper lobe. Several small pulmonary nodules are stable in number and size compared to the prior examination, largest of which is a 7 mm left upper lobe pulmonary nodule (axial image 73 of series 4). No other new suspicious appearing pulmonary nodules or masses are noted. Small bilateral pleural effusions lying dependently with some areas of  passive subsegmental atelectasis in the right lower lobe and dependent portion of the left upper lobe. Musculoskeletal: Median sternotomy wires. There are no aggressive appearing lytic or blastic lesions noted in the visualized portions of the skeleton. CT ABDOMEN PELVIS FINDINGS Hepatobiliary: No definite suspicious cystic or solid hepatic lesions are confidently identified on today's noncontrast CT examination. Status post cholecystectomy. Pancreas: No definite pancreatic mass or peripancreatic fluid collections. Peripancreatic inflammatory changes noted lateral to the head of the pancreas (in close association with the duodenum and porta hepatis). Spleen: Unremarkable. Adrenals/Urinary Tract: No calcifications are identified in the collecting system of either kidney, along the course of either ureter, or within the lumen of the urinary bladder. No hydroureteronephrosis. Multiple renal lesions bilaterally, incompletely characterized on today's non-contrast CT examination, but likely to represent cysts, largest of which is exophytic extending off the upper pole of the right kidney measuring 5.5 cm in diameter. Unenhanced appearance of the urinary bladder is normal. Bilateral adrenal glands are normal in appearance. Stomach/Bowel: Unenhanced appearance of the stomach is normal. Inflammatory changes noted surrounding the duodenum, extending into the adjacent porta hepatis. No pathologic dilatation of small bowel or colon. Numerous colonic diverticulae are noted, without surrounding inflammatory changes to suggest a colonic diverticulitis at this time. Normal appendix. Vascular/Lymphatic: Aortic atherosclerosis. No lymphadenopathy noted in the abdomen or pelvis. Reproductive: Uterus and ovaries are unremarkable in appearance. Other: Trace volume of ascites adjacent to the liver. No pneumoperitoneum. Musculoskeletal: There are no aggressive appearing lytic or blastic lesions noted in the visualized portions of the  skeleton. IMPRESSION: 1. Inflammatory changes surrounding the duodenum, and adjacent to the  head of the pancreas, with extension into the region of the porta hepatis. Findings are concerning for probable duodenitis. No inflammatory changes are noted adjacent to the body or tail of the pancreas, but correlation with lipase levels is also recommended. 2. Small bilateral pleural effusions lying dependently with some associated passive subsegmental atelectasis in the dependent portions of the lungs. 3. Trace volume of ascites most evident adjacent to the liver. 4. Colonic diverticulosis without evidence of acute diverticulitis at this time. 5. Aortic atherosclerosis, in addition to left main and 3 vessel coronary artery disease. Status post CABG and TAVR. 6. All previously noted small pulmonary nodules are stable compared to prior examinations dating back to at least 2019, considered benign. 7. Additional incidental findings, as above. Electronically Signed   By: Vinnie Langton M.D.   On: 11/05/2020 16:48    Cardiac Studies   Echo 11/05/2020 1. S/p TAVR (23 mm evolout pro). MG slightly higher across prosthesis  likely due to Afib with RVR and sepsis. MR is now moderate to severe. TR  is now moderate to severe. Would have low threshold for TEE if the patient  develops bacteremia with prosthetic  AVR and change in MR/TR. No definitive vegetation seen on this TTE.  2. 23 mm evolute pro (ViV procedure for prior 21 mm Magna ease) in the  aortic position. Vmax 2.8 m/s, MG 21 mmHG, EOA 1.18 cm2, DI 0.28. MG  slightly higher than what was reported at [facility] (15 mmHG 05/30/2020),  likely due to Afib and sepsis. The AT is  slightly prolonged ~114 msec. If bacteremia is present would recommend  TEE. The aortic valve has been repaired/replaced. Aortic valve  regurgitation is not visualized. There is a 23 mm CoreValve-Evolut Pro  prosthetic (TAVR) valve present in the aortic  position. Procedure Date: 05/01/2020.   3. Left ventricular ejection fraction, by estimation, is 50 to 55%. The  left ventricle has low normal function. The left ventricle has no regional  wall motion abnormalities. There is mild concentric left ventricular  hypertrophy. Left ventricular  diastolic function could not be evaluated.  4. Right ventricular systolic function is moderately reduced. The right  ventricular size is mildly enlarged. There is moderately elevated  pulmonary artery systolic pressure. The estimated right ventricular  systolic pressure is 40.0 mmHg.  5. Right atrial size was mildly dilated.  6. The mitral valve is degenerative. Moderate to severe mitral valve  regurgitation. No evidence of mitral stenosis.  7. The tricuspid valve is abnormal. Tricuspid valve regurgitation is  moderate to severe.  8. The inferior vena cava is dilated in size with >50% respiratory  variability, suggesting right atrial pressure of 8 mmHg.   Patient Profile     86 y.o. 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   Assessment & Plan    1. New onset afib RVR  - Started on IV Cardizem with improved HR then discontinued.  - HR stable in 90-100s without rate control agent >> will improved with resolving sepsis  - LVEF low normal to 50-55% - On heparin for anticoagulation>> change to DOAC  2. Elevated troponin  - not ACS pattern - Likely demand - On heparin for anticoagulation   3. Valvular heart dz - Echo with LVEF  of 50-55% - S/p TAVR (23 mm evolout pro). MG 21 mmHG, EOA 1.18 cm2, DI 0.28. MG  slightly higher than what was reported at [facility] (15 mmHG 05/30/2020),  likely due to Afib and sepsis. -MR is now moderate to severe. TR  is now moderate to severe.  - No plan for TEE unless positive  blood culture   For questions or updates, please contact [place] Please consult [email or web address] for contact info under        Signed, Leanor Kail, PA  11/06/2020, 11:05 AM     ATTENDING ATTESTATION  I have seen, examined and evaluated the patient this morning along with Mr. Curly [place], [place].  After reviewing all the available data and chart, we discussed the patients laboratory, study & physical findings as well as symptoms in detail. I agree with his findings, examination as well as impression recommendations as per our discussion.    Continues to be in A. fib, but now with more ectopy.  Rate still controlled relatively well.  I suspect that this is being driven by the underlying infection/sepsis.  Elevated troponin in the setting is still probably most likely demand ischemia in the setting of A. fib and sepsis, however we are treating possible ACS with IV heparin.  She never had any chest pain, and no significant wall motion abnormality on echo.  She seems to be more awake and alert.  No longer on BiPAP.  Complaining now more of a headache and arm pain  I agreed that we eventually wanted to switch to DOAC, provided no further instrumentation is can be required, could start Eliquis this evening.  There has been discussion about whether or not they would want to do Eliquis versus warfarin.  Eliquis is easiest and can be switched simply tonight, warfarin require continued IV heparin while bridging to therapeutic INR.  Blood cultures are still pending.  TAVR valve appears to be stable but MR seems to be more significant (likely related to A. fib.  This would also swelling TR.  The increased gradient across the valve is probably related to A. fib RVR and sepsis. ->  Clearly with prosthetic valve, if blood cultures are positive, would need TEE.  Once stabilized, and A. fib controlled, we can potentially reimage with echo to reassess valve function.   Glenetta Hew, M.D.,  M.S. Interventional Cardiologist   Pager # [phone] Phone # [phone] [address] [place], [place] [address]

## 2020-11-06 NOTE — Progress Notes (Signed)
[facility] for heparin Indication: atrial fibrillation  Allergies  Allergen Reactions  . Fig Extract [Ficus] Anaphylaxis  . Citalopram Hydrobromide Other (See Comments)    Pt unsure if this is accurate  . Sulfa Antibiotics Other (See Comments)    Severe nervousness.  . Prednisone Other (See Comments)    Other reaction(s): Other (See Comments) So nervous I could climb the walls So nervous I could climb the walls     Patient Measurements: Height: 5\' 3"  (160 cm) Weight: 78.2 kg (172 lb 6.4 oz) IBW/kg (Calculated) : 52.4 Heparin Dosing Weight: 68.7kg  Vital Signs: Temp: 98 F (36.7 C) (03/17 0338) Temp Source: Oral (03/17 0338) BP: 99/67 (03/17 0600) Pulse Rate: 97 (03/17 0600)  Labs: Recent Labs    11/05/20 0649 11/05/20 0650 11/05/20 0726 11/05/20 0730 11/05/20 1148 11/05/20 1242 11/05/20 1508 11/06/20 0137  HGB 12.7  --   --  12.9 10.7*  --   --  10.8*  HCT 38.6  --   --  38.0 30.9*  --   --  30.6*  PLT 161  --   --   --  108*  --   --  138*  APTT  --   --  32  --   --   --   --   --   LABPROT  --   --  15.6*  --   --   --   --   --   INR  --   --  1.3*  --   --   --   --   --   HEPARINUNFRC  --   --   --   --   --   --   --  <0.10*  CREATININE 1.33*  --   --   --  1.20*  --   --  0.93  TROPONINIHS  --  1,716*  --   --   --  1,693* 1,404*  --     Estimated Creatinine Clearance: 45.4 mL/min (by C-G formula based on SCr of 0.93 mg/dL).   Assessment: 34 YOF presenting with SOB, in afib with RVR in ED which is new, she is not on anticoagulation PTA.  CBC wnl.    Heparin level undetectable on gtt at 950 units/hr. No issues with line or bleeding reported per RN. Cards suspects demand ischemia and plan to run heparin for 72 hours which will also treat afib. Eventual plan to convert to DOAC  Goal of Therapy:  Heparin level 0.3-0.7 units/ml Monitor platelets by anticoagulation protocol: Yes   Plan: Increase heparin to 1200  units/hr F/u 8 hour heparin level  Sherlon Handing, PharmD, BCPS Please see amion for complete clinical pharmacist phone list 11/06/2020 6:02 AM

## 2020-11-06 NOTE — Telephone Encounter (Signed)
Will keep message in triage until discharge.

## 2020-11-06 NOTE — Progress Notes (Addendum)
NAME:  Tracy Buck, MRN:  [id number], DOB:  [birth year], LOS: 1 ADMISSION DATE:  11/05/2020, CONSULTATION DATE:  3/16 REFERRING MD:  Dr Billy Fischer EDP, CHIEF COMPLAINT:  Sepsis  Brief History:  86 year old female admitted 3/16 with concerns of sepsis with unclear source and requiring BiPAP for hypoxia while at [facility]. Transferred to [facility] on empiric antibiotics.   History of Present Illness:  86 year old female with PMH as below, which is significant for COPD, HTN, bioprosthetic aortic valve (2021), CAD, and lung cancer (carcinoid) s/p lobectomy. She was in her usual state of health until approximately 3/6 when she developed progressive fatigue. She saw PCP and cardiologist with these complaints without identifiable etiology. It was felt that perhaps polypharmacy was playing a role. Her PRN alprazolam was discontinued and Ambien dose was reduced from 10mg  to 5mg  QHS.   Then 3/16 early AM she developed shortness of breath and chest tightness, prompting her to present to [place]. No associated symptoms were described by to EDP. EKG was felt to be non-ischemic and represented AF-RVR. SHe was started on diltiazem bolus/infusion. On further evaluation she has low grade fever (100F oral) and leukocytosis, so sepsis was considered. She was started on empiric antibiotics and sepsis workup was initiated. No clear source identified although CXR did describe a left sided effusion at her former lobectomy site. Laboratory evaluation otherwise significant for Troponin 1700, BNP 1682, Creatinine 1.33, glucose 278, lactic acid 8.7. She then became hypotensive. Diltiazem was stopped and she was given IVF. Transferred to [facility] with PCCM accepting for ICU admission.   Past Medical History:   has a past medical history of Arthritis, Complication of anesthesia, COPD (chronic obstructive pulmonary disease) ([place]), Heart valve replaced, Hyperlipidemia, Hypertension, LBBB (left bundle branch block), Lung cancer  ([place]) (2013), Lung mass, Myocardial infarct ([place]), Panic attack, Pneumonia, Rib fracture, and Skin cancer of nose.   Significant Hospital Events:  3/16 admit to ICU with working diagnosis sepsis  Consults:    Procedures:    Significant Diagnostic Tests:    Micro Data:  Blood 3/16 > Urine 3/16 >  Antimicrobials:  Flagyl 3/16 ED Cefepime 3/16 > Vancomycin 3/16 >  Interim History / Subjective:  No events, looks/feels better. Willing to try some liquids. Afib with controlled rate.  Objective   Blood pressure 110/77, pulse 78, temperature 98 F (36.7 C), temperature source Oral, resp. rate (!) 28, height 5\' 3"  (1.6 m), weight 78.2 kg, SpO2 99 %.    FiO2 (%):  [30 %] 30 %   Intake/Output Summary (Last 24 hours) at 11/06/2020 0755 Last data filed at 11/06/2020 6734 Gross per 24 hour  Intake 4313.95 ml  Output 1067 ml  Net 3246.95 ml   Filed Weights   11/05/20 0648 11/05/20 0707 11/05/20 1002  Weight: 76.2 kg 76.2 kg 78.2 kg    Examination: Constitutional: elderly woman in NAD  Eyes: EOMI, pupils equal Ears, nose, mouth, and throat: trachea midline Cardiovascular: irregular, ext warm Respiratory: Diminished bases Gastrointestinal: soft, less TTP Skin: No rashes, normal turgor Neurologic: moves all 4 ext to command Psychiatric: more awake and interactive  WBC improved Cr improved Lactate improved Trop peaked Echo changes noted  Resolved Hospital Problem list     Assessment & Plan:  Severe sepsis secondary to duodenitis- cultures negative to date; MRSA swab neg.  Appropriately fluid-rescuscitated. - f/u blood cultures - DC vanc - zosyn for now, can narrow to augmentin nearer to DC pending culture data -  would do 7-10 day therapy  Echo changes- worsened MR, TAVR position slightly off Hx CABG, TAVR Afib RVR now rate controlled Trop Leak - All together would be most c/w sepsis-associated demand ischemia with valvular issues enhanced by septic state and  afib/RVR.  Should bacterial cultures be positive, TEE is a must.  If not, would just repeat echo at [facility] in a month or so. - Patient probably should be on AC until Afib is considered completely ablated, will defer to cardiology regarding this  Abnormal [place] R costovertebral angle and tenderness- looks like this is an old renal cyst, similar in size to previous in 2013  L effusion, hx melanoma, LLL carcinoid post resection- suspicion this is mostly third spacing but will have patient come to pulmonary office in 4 weeks to see if residual fluid that may be worth sampling  Questionable COPD diagnosis- resume home LABA/LAMA/ICS   Allergies, HTN, HLD- resume home statin, hold antihypertensives for now given borderline pressures  AKI- improved with fluids, encourage PO fluids, bicarb drip off  Best practice (evaluated daily)  Diet: boost + clear liquid, advance as tolerated Pain/Anxiety/Delirium protocol (if indicated): NA VAP protocol (if indicated): NA DVT prophylaxis: full dose heparin GI prophylaxis: PPI Glucose control: NA Mobility: Bedrest Disposition: stable for transfer to progressive, appreciate [facility] taking over care starting 3/18.  Remaining issues are (A) awaiting culture maturity to see if needs TEE, (B) PT consult, (C) final cardiology recommendations, (D) assure tolerating PO.  Goals of Care:  Last date of multidisciplinary goals of care discussion: 3/16 Family and staff present: Dr Tamala Julian, myself, patient and both daughters Summary of discussion: Full code. No prolonged life support Follow up goals of care discussion due: 3/23 Code Status: FULL  Erskine Emery MD PCCM

## 2020-11-06 NOTE — Progress Notes (Signed)
PHARMACY - PHYSICIAN COMMUNICATION CRITICAL VALUE ALERT - BLOOD CULTURE IDENTIFICATION (BCID)  Tracy Buck is an 86 y.o. female who presented to [place] on 11/05/2020  Assessment:  50 yof admitted with concerns of sepsis of unclear source, continuing on Zosyn with plans for 7-10 days of therapy, possibly transitioning to Augmentin near d/c. Now 1 of 2 BCx bottles (only 1 set drawn) growing GPR.  Name of physician (or Provider) Contacted: Ogan, O (CCM Elink)  Current antibiotics: Zosyn  Changes to prescribed antibiotics recommended:  None - likely contaminant  No results found for this or any previous visit.   Arturo Morton, PharmD, BCPS Please check AMION for all [place] contact numbers Clinical Pharmacist 11/06/2020 8:20 PM

## 2020-11-06 NOTE — Progress Notes (Signed)
[facility] for heparin Indication: atrial fibrillation  Allergies  Allergen Reactions  . Fig Extract [Ficus] Anaphylaxis  . Citalopram Hydrobromide Other (See Comments)    Pt unsure if this is accurate  . Sulfa Antibiotics Other (See Comments)    Severe nervousness.  . Prednisone Other (See Comments)    Other reaction(s): Other (See Comments) So nervous I could climb the walls So nervous I could climb the walls     Patient Measurements: Height: 5\' 3"  (160 cm) Weight: 78.2 kg (172 lb 6.4 oz) IBW/kg (Calculated) : 52.4 Heparin Dosing Weight: 68.7kg  Vital Signs: Temp: 97.9 F (36.6 C) (03/17 1100) Temp Source: Oral (03/17 1100) BP: 97/57 (03/17 1200) Pulse Rate: 100 (03/17 1233)  Labs: Recent Labs    11/05/20 0649 11/05/20 0650 11/05/20 0726 11/05/20 0730 11/05/20 1148 11/05/20 1242 11/05/20 1508 11/06/20 0137 11/06/20 1429  HGB 12.7  --   --  12.9 10.7*  --   --  10.8*  --   HCT 38.6  --   --  38.0 30.9*  --   --  30.6*  --   PLT 161  --   --   --  108*  --   --  138*  --   APTT  --   --  32  --   --   --   --   --   --   LABPROT  --   --  15.6*  --   --   --   --   --   --   INR  --   --  1.3*  --   --   --   --   --   --   HEPARINUNFRC  --   --   --   --   --   --   --  <0.10* <0.10*  CREATININE 1.33*  --   --   --  1.20*  --   --  0.93  --   TROPONINIHS  --  1,716*  --   --   --  1,693* 1,404*  --   --     Estimated Creatinine Clearance: 45.4 mL/min (by C-G formula based on SCr of 0.93 mg/dL).   Assessment: 89 YOF presenting with SOB, in afib with RVR in ED which is new, she is not on anticoagulation PTA.  She does not have insurance so plans are to start warfarin (discusses with Dr. Ellyn Hack) this PM.   Heparin level remains undetectable on 1200 units/hr. Heparin running well per RN. PIV at upper site on same arm with bleeding. RN to move IV Heparin to other arm to see if this can help. No other overt bleeding noted.  Hgb is stable. Platelets are trending up.    Goal of Therapy:  Heparin level 0.3-0.7 units/ml Monitor platelets by anticoagulation protocol: Yes   Plan: Increase heparin to 1450 units/hr Recheck in 8 hours.  Daily INR, heparin level and CBC  Sloan Leiter, PharmD, BCPS, BCCCP Clinical Pharmacist Please refer to [facility] for [place] numbers 11/06/2020, 3:12 PM

## 2020-11-06 NOTE — Progress Notes (Signed)
Nutrition Follow-up  DOCUMENTATION CODES:   Non-severe (moderate) malnutrition in context of acute illness/injury  INTERVENTION:   - Continue Boost Breeze po TID, each supplement provides 250 kcal and 9 grams of protein  - RD will monitor for diet advancement and adjust supplement regimen as appropriate  NUTRITION DIAGNOSIS:   Moderate Malnutrition related to acute illness (sepsis 2/2 duodenitis) as evidenced by mild fat depletion,mild muscle depletion,energy intake < 75% for > 7 days.  GOAL:   Patient will meet greater than or equal to 90% of their needs  MONITOR:   PO intake,Supplement acceptance,Diet advancement,Weight trends  REASON FOR ASSESSMENT:   Malnutrition Screening Tool    ASSESSMENT:   86 year old female who presented to the ED on 3/16 with SOB. PMH of COPD, HTN, HLD, bioprosthetic aortic valve (2021), CAD, and lung cancer s/p left lower lobectomy. Pt admitted with concerns of sepsis secondary to duodenitis and requiring BiPAP for hypoxia.   Discussed pt with RN and during ICU rounds. Diet advanced to clear liquids this morning with Boost Breeze added.  Spoke with pt at bedside. Pt reports decreased PO intake and decreased appetite for about 1 month PTA. Pt is unsure why her appetite decreased but thinks that it may be due to her infection. Pt states that she was still eating 3 times a day but only "picking" at her meals (eating a few bites of each food item at a meal).  Pt is unsure whether she has lost any weight recently. She reports her UBW is 165 lbs. Weight on admission documented as 168.1 lbs. Weight appears stable over the last 4 years.  Pt with an empty Boost Breeze at bedside. She is willing to consume these while on a clear liquid diet. Pt also consumed some jello this morning without issue. She reports some nausea at time of RD visit but states that it is not too bad. RD will monitor for diet advancement and adjust supplement regimen as  appropriate.  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 Pt reports being very independent prior to becoming sick.  Medications reviewed and include: Boost Breeze TID, melatonin, IV protonix, heparin, IV abx  Labs reviewed: sodium 132, elevated LFTs CBG's: 107-179 x 24 hours  UOP: 1067 ml x 24 hours I/O's: +3.3 L since admit  NUTRITION - FOCUSED PHYSICAL EXAM:  Flowsheet Row Most Recent Value  Orbital Region Mild depletion  Upper Arm Region No depletion  Thoracic and Lumbar Region No depletion  Buccal Region Mild depletion  Temple Region Mild depletion  Clavicle Bone Region Mild depletion  Clavicle and Acromion Bone Region Mild depletion  Scapular Bone Region Mild depletion  Dorsal Hand No depletion  Patellar Region No depletion  Anterior Thigh Region Mild depletion  Posterior Calf Region Mild depletion  Edema (RD Assessment) Mild  Hair Reviewed  Eyes Reviewed  Mouth Reviewed  Skin Reviewed  Nails Reviewed       Diet Order:   Diet Order            Diet clear liquid Room service appropriate? Yes; Fluid consistency: Thin  Diet effective now                 EDUCATION NEEDS:   No education needs have been identified at this time  Skin:  Skin Assessment: Reviewed RN Assessment  Last BM:  11/05/20  Height:   Ht Readings from Last 1 Encounters:  11/05/20 5\' 3"  (1.6 m)  Weight:   Wt Readings from Last 1 Encounters:  11/05/20 78.2 kg    BMI:  Body mass index is 30.54 kg/m.  Estimated Nutritional Needs:   Kcal:  1600-1800  Protein:  80-95 grams  Fluid:  1.6-1.8 L    Gustavus Bryant, MS, RD, LDN Inpatient Clinical Dietitian Please see AMiON for contact information.

## 2020-11-06 NOTE — Evaluation (Signed)
Physical Therapy Evaluation Patient Details Name: Tracy Buck MRN: [id number] DOB: [birth year] Today's Date: 11/06/2020   History of Present Illness  86 yo admitted to [facility] 3/16 with sepsis and hypoxia requiring Bipap with transfer to [facility] pt with Afib with RVR. PMhx: COPD, HTN, TAVR, CAD, lung CA s/p lobectomy, HLD  Clinical Impression  Pt pleasant and eager to get OOB today with ability to transfer from bed to chair but unable to walk due to fatigue and weakness. Pt with decreased strength, transfers, balance and activity tolerance who will benefit from acute therapy to maximize mobility, safety and independence to decrease burden of care.   Supine 97/57 (67) Sitting 100/64 (69) HR 101-103 SpO2 98% on 4L with drop to 88% on RA and able to maintain 94% on 2L    Follow Up Recommendations Home health PT;Supervision/Assistance - 24 hour    Equipment Recommendations  None recommended by PT    Recommendations for Other Services OT consult     Precautions / Restrictions Precautions Precautions: Fall      Mobility  Bed Mobility Overal bed mobility: Needs Assistance Bed Mobility: Supine to Sit     Supine to sit: Min assist;HOB elevated     General bed mobility comments: HOB 30 degrees with increased time and cues to pivot to left side of bed with assist to fully elevate trunk and scoot to EOB    Transfers Overall transfer level: Needs assistance   Transfers: Sit to/from Stand;Stand Pivot Transfers Sit to Stand: Min assist Stand pivot transfers: Min assist       General transfer comment: min assist to stand from bed and from recliner with 6 total transfers with single UE hooking to stand and pivot to chair  Ambulation/Gait             General Gait Details: pt denied attempting due to fatigue  Stairs            Wheelchair Mobility    Modified Rankin (Stroke Patients Only)       Balance Overall balance assessment: Needs assistance    Sitting balance-Leahy Scale: Fair Sitting balance - Comments: pt able to sit EOB without UE support   Standing balance support: Single extremity supported Standing balance-Leahy Scale: Poor Standing balance comment: UE support for balance                             Pertinent Vitals/Pain Pain Assessment: No/denies pain    Home Living Family/patient expects to be discharged to:: Private residence Living Arrangements: Children Available Help at Discharge: Family;Available 24 hours/day   Home Access: Stairs to enter Entrance Stairs-Rails: Right;Left;Can reach both Entrance Stairs-Number of Steps: 5 Home Layout: Two level;Bed/bath upstairs Home Equipment: Cane - single point;Walker - 2 wheels;Wheelchair - [employer];Shower seat      Prior Function Level of Independence: Independent         Comments: back to [employer]. son in law works from home. 1 fall in the last year     Hand Dominance        Extremity/Trunk Assessment   Upper Extremity Assessment Upper Extremity Assessment: Generalized weakness    Lower Extremity Assessment Lower Extremity Assessment: Generalized weakness    Cervical / Trunk Assessment Cervical / Trunk Assessment: Normal  Communication   Communication: No difficulties  Cognition Arousal/Alertness: Awake/alert Behavior During Therapy: WFL for tasks assessed/performed Overall Cognitive Status: Within Functional Limits for tasks assessed  General Comments      Exercises     Assessment/Plan    PT Assessment Patient needs continued PT services  PT Problem List Decreased mobility;Decreased activity tolerance;Decreased knowledge of use of DME;Decreased balance;Decreased strength       PT Treatment Interventions Gait training;Balance training;Functional mobility training;Stair training;Therapeutic activities;Patient/family education;DME  instruction;Therapeutic exercise    PT Goals (Current goals can be found in the Care Plan section)  Acute Rehab PT Goals Patient Stated Goal: return to crafts doing [employer] PT Goal Formulation: With patient Time For Goal Achievement: 11/20/20 Potential to Achieve Goals: Good    Frequency Min 3X/week   Barriers to discharge        Co-evaluation               AM-PAC PT "6 Clicks" Mobility  Outcome Measure Help needed turning from your back to your side while in a flat bed without using bedrails?: A Little Help needed moving from lying on your back to sitting on the side of a flat bed without using bedrails?: A Little Help needed moving to and from a bed to a chair (including a wheelchair)?: A Little Help needed standing up from a chair using your arms (e.g., wheelchair or bedside chair)?: A Little Help needed to walk in hospital room?: A Lot Help needed climbing 3-5 steps with a railing? : Total 6 Click Score: 15    End of Session Equipment Utilized During Treatment: Gait belt Activity Tolerance: Patient tolerated treatment well Patient left: in chair;with call bell/phone within reach;with chair alarm set;with family/visitor present Nurse Communication: Mobility status PT Visit Diagnosis: Other abnormalities of gait and mobility (R26.89);Difficulty in walking, not elsewhere classified (R26.2);Muscle weakness (generalized) (M62.81)    Time: 4782-9562 PT Time Calculation (min) (ACUTE ONLY): 24 min   Charges:   PT Evaluation $PT Eval Moderate Complexity: 1 Mod PT Treatments $Therapeutic Activity: 8-22 mins        Deandra Goering P, PT Acute Rehabilitation Services Pager: [phone] Office: [place] [facility] [facility] 11/06/2020, 1:56 PM

## 2020-11-06 NOTE — Progress Notes (Signed)
[facility] for heparin Indication: atrial fibrillation  Allergies  Allergen Reactions  . Fig Extract [Ficus] Anaphylaxis  . Citalopram Hydrobromide Other (See Comments)    Pt unsure if this is accurate  . Sulfa Antibiotics Other (See Comments)    Severe nervousness.  . Prednisone Other (See Comments)    Other reaction(s): Other (See Comments) So nervous I could climb the walls So nervous I could climb the walls     Patient Measurements: Height: 5\' 3"  (160 cm) Weight: 78.2 kg (172 lb 6.4 oz) IBW/kg (Calculated) : 52.4 Heparin Dosing Weight: 68.7kg  Vital Signs: Temp: 97.9 F (36.6 C) (03/17 1100) Temp Source: Oral (03/17 1100) BP: 97/57 (03/17 1200) Pulse Rate: 100 (03/17 1233)  Labs: Recent Labs    11/05/20 0649 11/05/20 0650 11/05/20 0726 11/05/20 0730 11/05/20 1148 11/05/20 1242 11/05/20 1508 11/06/20 0137  HGB 12.7  --   --  12.9 10.7*  --   --  10.8*  HCT 38.6  --   --  38.0 30.9*  --   --  30.6*  PLT 161  --   --   --  108*  --   --  138*  APTT  --   --  32  --   --   --   --   --   LABPROT  --   --  15.6*  --   --   --   --   --   INR  --   --  1.3*  --   --   --   --   --   HEPARINUNFRC  --   --   --   --   --   --   --  <0.10*  CREATININE 1.33*  --   --   --  1.20*  --   --  0.93  TROPONINIHS  --  1,716*  --   --   --  1,693* 1,404*  --     Estimated Creatinine Clearance: 45.4 mL/min (by C-G formula based on SCr of 0.93 mg/dL).   Assessment: 14 YOF presenting with SOB, in afib with RVR in ED which is new, she is not on anticoagulation PTA.  She does not have insurance so plans are to start warfarin (discusses with Dr. Ellyn Hack) -baseline INR= 1.3 -hg= 10.8, plt= 132 -t bili= 2.3   Goal of Therapy:  Heparin level 0.3-0.7 units/ml Monitor platelets by anticoagulation protocol: Yes   Plan: Continue heparin 1200 units/hr Start warfarin 2.5mg  x1 tonight Daily INR, heparin level and CBC  Hildred Laser,  PharmD Clinical Pharmacist **Pharmacist phone directory can now be found on [email or web address] ([email or web address]).  Listed under [place].

## 2020-11-06 NOTE — Progress Notes (Signed)
Trainer for heparin Indication: atrial fibrillation  Allergies  Allergen Reactions  . Fig Extract [Ficus] Anaphylaxis  . Citalopram Hydrobromide Other (See Comments)    Pt unsure if this is accurate  . Sulfa Antibiotics Other (See Comments)    Severe nervousness.  . Prednisone Other (See Comments)    Other reaction(s): Other (See Comments) So nervous I could climb the walls So nervous I could climb the walls     Patient Measurements: Height: 5\' 3"  (160 cm) Weight: 78.2 kg (172 lb 6.4 oz) IBW/kg (Calculated) : 52.4 Heparin Dosing Weight: 68.7kg  Vital Signs: Temp: 98.1 F (36.7 C) (03/17 1515) Temp Source: Oral (03/17 1515) BP: 108/69 (03/17 1515) Pulse Rate: 83 (03/17 1515)  Labs: Recent Labs    11/05/20 0649 11/05/20 0650 11/05/20 0726 11/05/20 0730 11/05/20 1148 11/05/20 1242 11/05/20 1508 11/06/20 0137 11/06/20 1429  HGB 12.7  --   --  12.9 10.7*  --   --  10.8*  --   HCT 38.6  --   --  38.0 30.9*  --   --  30.6*  --   PLT 161  --   --   --  108*  --   --  138*  --   APTT  --   --  32  --   --   --   --   --   --   LABPROT  --   --  15.6*  --   --   --   --   --   --   INR  --   --  1.3*  --   --   --   --   --   --   HEPARINUNFRC  --   --   --   --   --   --   --  <0.10* <0.10*  CREATININE 1.33*  --   --   --  1.20*  --   --  0.93  --   TROPONINIHS  --  1,716*  --   --   --  1,693* 1,404*  --   --     Estimated Creatinine Clearance: 45.4 mL/min (by C-G formula based on SCr of 0.93 mg/dL).   Assessment: 55 YOF presenting with SOB, in afib with RVR in ED which is new, she is not on anticoagulation PTA.  She does not have insurance and initial plans were for warfarin. The family has discussed and due to concerns of patients ability to get INRs checked they prefer using apixaban.   Goal of Therapy:  Heparin level 0.3-0.7 units/ml Monitor platelets by anticoagulation protocol: Yes   Plan: -apixaban 5mg  po bid  (start tonight) -d/c heparin when apixaban starts  Hildred Laser, PharmD Clinical Pharmacist **Pharmacist phone directory can now be found on [email or web address] ([email or web address]).  Listed under [place].

## 2020-11-07 DIAGNOSIS — I4891 Unspecified atrial fibrillation: Secondary | ICD-10-CM | POA: Diagnosis not present

## 2020-11-07 DIAGNOSIS — J9601 Acute respiratory failure with hypoxia: Secondary | ICD-10-CM | POA: Diagnosis not present

## 2020-11-07 DIAGNOSIS — N179 Acute kidney failure, unspecified: Secondary | ICD-10-CM | POA: Diagnosis not present

## 2020-11-07 DIAGNOSIS — R778 Other specified abnormalities of plasma proteins: Secondary | ICD-10-CM | POA: Diagnosis not present

## 2020-11-07 DIAGNOSIS — E440 Moderate protein-calorie malnutrition: Secondary | ICD-10-CM | POA: Insufficient documentation

## 2020-11-07 LAB — CBC
HCT: 32.1 % — ABNORMAL LOW (ref 36.0–46.0)
Hemoglobin: 11.3 g/dL — ABNORMAL LOW (ref 12.0–15.0)
MCH: 30.5 pg (ref 26.0–34.0)
MCHC: 35.2 g/dL (ref 30.0–36.0)
MCV: 86.5 fL (ref 80.0–100.0)
Platelets: 160 10*3/uL (ref 150–400)
RBC: 3.71 MIL/uL — ABNORMAL LOW (ref 3.87–5.11)
RDW: 14.9 % (ref 11.5–15.5)
WBC: 14.5 10*3/uL — ABNORMAL HIGH (ref 4.0–10.5)
nRBC: 0.1 % (ref 0.0–0.2)

## 2020-11-07 LAB — HEPATIC FUNCTION PANEL
ALT: 50 U/L — ABNORMAL HIGH (ref 0–44)
AST: 43 U/L — ABNORMAL HIGH (ref 15–41)
Albumin: 2 g/dL — ABNORMAL LOW (ref 3.5–5.0)
Alkaline Phosphatase: 110 U/L (ref 38–126)
Bilirubin, Direct: 0.5 mg/dL — ABNORMAL HIGH (ref 0.0–0.2)
Indirect Bilirubin: 1.2 mg/dL — ABNORMAL HIGH (ref 0.3–0.9)
Total Bilirubin: 1.7 mg/dL — ABNORMAL HIGH (ref 0.3–1.2)
Total Protein: 5 g/dL — ABNORMAL LOW (ref 6.5–8.1)

## 2020-11-07 NOTE — TOC Initial Note (Signed)
Transition of Care [facility]) - Initial/Assessment Note    Patient Details  Name: Tracy Buck MRN: [id number] Date of Birth: Nov 25, 1934  Transition of Care [facility]) CM/SW Contact:    Joanne Chars, LCSW Phone Number: 11/07/2020, 3:53 PM  Clinical Narrative:   CSW met with pt and daughter Tracy Buck to discuss discharge plan.  They are agreeable to [facility] referral, choice document given, no preference for provider.  Permission given to speak with daughter Tracy Buck and other daughter Tracy Buck.  PCP in place.  Pt is vaccinated for covid and boosted.  Equipment in home: walker, bedside commode, shower chair.                  Expected Discharge Plan: [place] Barriers to Discharge: Continued Medical Work up   Patient Goals and CMS Choice   CMS Medicare.gov Compare Post Acute Care list provided to:: Patient Represenative (must comment) Choice offered to / list presented to : Adult Children  Expected Discharge Plan and Services Expected Discharge Plan: [place] In-house Referral: Clinical Social Work   Post Acute Care Choice: [place] arrangements for the past 2 months: [place]: PT [place]: [place] Date [place]: 11/07/20 Time [place]: [id number] Representative spoke with at [place]: Tommi Rumps  Prior Living Arrangements/Services Living arrangements for the past 2 months: [place] with:: Adult Children (daughter and her family) Patient language and need for interpreter reviewed:: Yes Do you feel safe going back to the place where you live?: Yes      Need for Family Participation in Patient Care: Yes (Comment) Care giver support system in place?: Yes (comment) Current home services: Other (comment) (none) Criminal Activity/Legal Involvement Pertinent to Current Situation/Hospitalization: No - Comment as needed  Activities of Daily Living Home  Assistive Devices/Equipment: None ADL Screening (condition at time of admission) Patient's cognitive ability adequate to safely complete daily activities?: Yes Is the patient deaf or have difficulty hearing?: No Does the patient have difficulty seeing, even when wearing glasses/contacts?: No Does the patient have difficulty concentrating, remembering, or making decisions?: No Patient able to express need for assistance with ADLs?: No Does the patient have difficulty dressing or bathing?: No Independently performs ADLs?: Yes (appropriate for developmental age) Does the patient have difficulty walking or climbing stairs?: No Weakness of Legs: None Weakness of Arms/Hands: None  Permission Sought/Granted Permission sought to share information with : Family Supports Permission granted to share information with : Yes, Verbal Permission Granted  Share Information with NAME: daughters Tracy Buck and Tracy Buck           Emotional Assessment Appearance:: Appears stated age Attitude/Demeanor/Rapport: Engaged Affect (typically observed): Appropriate Orientation: : Oriented to Self,Oriented to Place,Oriented to  Time,Oriented to Situation Alcohol / Substance Use: Not Applicable Psych Involvement: No (comment)  Admission diagnosis:  Lactic acidosis [W10.2] Metabolic acidosis [V25.3] Elevated troponin [R77.8] Acute respiratory failure with hypoxia (HCC) [J96.01] Atrial fibrillation with RVR ([place]) [I48.91] Acute renal failure, unspecified acute renal failure type ([place]) [N17.9] Sepsis with acute renal failure without septic shock, due to unspecified organism, unspecified acute renal failure type ([place]) [A41.9, R65.20, N17.9] Patient Active Problem List   Diagnosis Date Noted  . Malnutrition of moderate degree 11/07/2020  . Acute respiratory failure with hypoxia ([place])  11/05/2020  . Lactic acidosis   . Elevated troponin   . Atrial fibrillation with RVR ([place])   . Sepsis ([place])   . Cholecystitis  09/16/2016  . S/P AVR 09/16/2016  . S/P CABG (coronary artery bypass graft) 09/16/2016  . Essential hypertension 09/16/2016  . COPD (chronic obstructive pulmonary disease) ([place]) 09/16/2016  . Hyperglycemia 09/16/2016  . Pre-op evaluation   . Hyperlipidemia    PCP:  Drake Leach, MD Pharmacy:   [facility], [address] [place] [address] Phone: [phone] Fax: [phone]  [id number] [address], [address] AT [facility] [address] [place] [address] Phone: [phone] Fax: [phone]     Social Determinants of Health ([place]) Interventions    Readmission Risk Interventions No flowsheet data found.

## 2020-11-07 NOTE — Progress Notes (Signed)
[facility]                                                                              Patient Demographics  Tracy Buck, is a 86 y.o. female, DOB - 12-06-1934, HBZ:[id number]  Admit date - 11/05/2020   Admitting Physician Candee Furbish, MD  Outpatient Primary MD for the patient is Drake Leach, MD  Outpatient specialists:   LOS - 2  days   Medical records reviewed and are as summarized below:    Chief Complaint  Patient presents with  . Shortness of Breath       Brief summary   Patient is a 86 year old female with history of COPD, hypertension, bioprosthetic aortic valve, 2021, CAD and lung cancer (carcinoid) s/p lobectomy. She was in her usual state of health until approximately 3/6 when she developed progressive fatigue. She saw PCP and cardiologist with these complaints without identifiable etiology. It was felt that perhaps polypharmacy was playing a role. Her PRN alprazolam was discontinued and Ambien dose was reduced from 10mg  to 5mg  QHS. On 3/16, early a.m., developed shortness of breath and chest tightness, presented to [place].  EKG was felt nonischemic, had atrial fibrillation with RVR.  Patient was started on diltiazem bolus and infusion.  Patient also had low-grade fever 100 F, leukocytosis, placed on empiric antibiotic and sepsis work-up was initiated.  Chest x-ray showed left-sided effusion at her former lobectomy site, creatinine 1.3, lactic acid 8.7, troponin 1700, BNP 1682. Patient then became hypotensive and was admitted to ICU.  Severe sepsis was presumed due to duodenitis and antibiotics were narrowed, now on IV Zosyn, for 7 to 10 days.  Cardiology was also consulted for atrial fibrillation with RVR and abnormal echo changes (worsened MR, TAVR position slightly off). Patient was transferred to [facility] service on 11/07/2020   Assessment & Plan   Principal problem Severe sepsis presumed due to duodenitis, present on  admission -Urine culture negative.  Blood culture positive for gram-positive rods, sensitivities pending -CT renal stone study showed inflammatory changes surrounding the duodenum and adjacent to the head of the pancreas with extension into the region of porta hepatis, concerning for probable duodenitis. -Leukocytosis improving, afebrile -IV antibiotics narrowed to Zosyn (previously on IV vancomycin, cefepime and Flagyl), continue for total of 7 to 10 days.  Can narrow to Augmentin closer to discharge. -Advance diet to solids  Active Problems: Atrial fibrillation with RVR, new onset -Patient was placed on IV Cardizem drip, heart rate improved however patient developed hypotension -Cardiology consulted, heart rate now improving without rate control agent -Patient was placed on IV heparin, now transitioned to apixaban -Elevated troponin likely due to demand ischemia, atrial fibrillation with RVR, not ACS -2D echo with LVEF of 50 to 55%  Abnormal EKG changes, worsened MR, TR, TAVR position change -Cardiology following, MR is now moderate to severe, TR is now moderate to severe -No plan for TEE unless positive blood cultures  Acute kidney injury with metabolic acidosis, lactic acidosis -Baseline creatinine 0.9-1.05 -Presented with creatinine of 1.3, lactic acid 8.7 on admission -Patient was placed on bicarb drip, lactic acid improved to 2.3,  bicarb normal 24, creatinine 0.9 -Diet advanced to solids today  COPD -Currently stable, no wheezing, continue outpatient inhalers  Hypertension -BP currently stable, holding antihypertensives  Hyperlipidemia Continue statin  Progressive fatigue, dizziness, generalized debility -Patient was seen by her PCP, cardiologist, no identifiable etiology and.  Perhaps polypharmacy playing a role.  Xanax was discontinued.  Ambien dose decreased from 10 mg to 5 mg, now discontinued -Continue melatonin -Would not resume Xanax outpatient, discussed with  patient's daughter -Continue PT OT, mobilization, very deconditioned  Malnutrition of moderate degree -Very deconditioned, diet advanced today, albumin 2.0 Estimated body mass index is 30.54 kg/m as calculated from the following:   Height as of this encounter: 5\' 3"  (1.6 m).   Weight as of this encounter: 78.2 kg.  Code Status: Full CODE STATUS DVT Prophylaxis:  SCDs Start: 11/05/20 1021 apixaban (ELIQUIS) tablet 5 mg   Level of Care: Level of care: Progressive, transfer to the floor today Family Communication: Discussed all imaging results, lab results, explained to the patient's daughter, Levada Dy.  Per daughter, patient lives with her, had been driving and functional prior to admission.  Continue physical therapy, goal to return home, may need home health PT   Disposition Plan:     Status is: Inpatient  Remains inpatient appropriate because:Inpatient level of care appropriate due to severity of illness   Dispo: The patient is from: Home              Anticipated d/c is to: Home              Patient currently is not medically stable to d/c.  Diet advanced today, very deconditioned.   Difficult to place patient No      Time Spent in minutes 35 minutes  Procedures:  2D echo  Consultants:   Patient was admitted by critical care Cardiology  Antimicrobials:   Anti-infectives (From admission, onward)   Start     Dose/Rate Route Frequency Ordered Stop   11/06/20 0600  vancomycin (VANCOREADY) IVPB 750 mg/150 mL  Status:  Discontinued        750 mg 150 mL/hr over 60 Minutes Intravenous Every 24 hours 11/05/20 0757 11/06/20 0745   11/05/20 2000  ceFEPIme (MAXIPIME) 2 g in sodium chloride 0.9 % 100 mL IVPB  Status:  Discontinued        2 g 200 mL/hr over 30 Minutes Intravenous Every 12 hours 11/05/20 0757 11/05/20 1704   11/05/20 1745  piperacillin-tazobactam (ZOSYN) IVPB 3.375 g        3.375 g 12.5 mL/hr over 240 Minutes Intravenous Every 8 hours 11/05/20 1720     11/05/20  0730  ceFEPIme (MAXIPIME) 2 g in sodium chloride 0.9 % 100 mL IVPB        2 g 200 mL/hr over 30 Minutes Intravenous  Once 11/05/20 0716 11/05/20 0814   11/05/20 0730  metroNIDAZOLE (FLAGYL) IVPB 500 mg        500 mg 100 mL/hr over 60 Minutes Intravenous  Once 11/05/20 0716 11/05/20 0850   11/05/20 0730  vancomycin (VANCOCIN) IVPB 1000 mg/200 mL premix        1,000 mg 200 mL/hr over 60 Minutes Intravenous  Once 11/05/20 0716 11/05/20 0914          Medications  Scheduled Meds: . apixaban  5 mg Oral BID  . aspirin EC  81 mg Oral Daily  . Chlorhexidine Gluconate Cloth  6 each Topical Daily  . feeding supplement  1 Container Oral TID  BM  . fluticasone furoate-vilanterol  1 puff Inhalation Daily  . loratadine  10 mg Oral Daily  . melatonin  3 mg Oral QHS  . rosuvastatin  20 mg Oral Daily  . umeclidinium bromide  1 puff Inhalation Daily   Continuous Infusions: . sodium chloride Stopped (11/05/20 0847)  . piperacillin-tazobactam (ZOSYN)  IV 12.5 mL/hr at 11/07/20 0900   PRN Meds:.sodium chloride, acetaminophen, docusate sodium, fentaNYL (SUBLIMAZE) injection, polyethylene glycol      Subjective:   Kahlyn Shippey was seen and examined today. Very deconditioned, somewhat dizzy on standing up.  Had a BM this morning, no hematochezia or melena. + Anxiety Patient denies dizziness, chest pain, shortness of breath, abdominal pain, N/V/D/C.  Afebrile  Objective:   Vitals:   11/07/20 0630 11/07/20 0700 11/07/20 0730 11/07/20 0808  BP:  (!) 137/58    Pulse: 88 (!) 103 98   Resp: (!) 30 (!) 31 (!) 28   Temp:    98 F (36.7 C)  TempSrc:    Oral  SpO2: 98% 99% 98%   Weight:      Height:        Intake/Output Summary (Last 24 hours) at 11/07/2020 1031 Last data filed at 11/07/2020 0900 Gross per 24 hour  Intake 778.64 ml  Output 1400 ml  Net -621.36 ml     Wt Readings from Last 3 Encounters:  11/07/20 78.2 kg  08/07/17 78.9 kg  12/04/16 77.1 kg     Exam  General:  Alert and oriented x 3, NAD, weak and deconditioned  Cardiovascular: S1 S2 auscultated, 1/6 SEM, RRR  Respiratory: Diminished breath sound at the bases  Gastrointestinal: Soft, nontender, nondistended, + bowel sounds  Ext: no pedal edema bilaterally  Neuro: no new deficits  Musculoskeletal: No digital cyanosis, clubbing  Skin: No rashes  Psych: Normal affect and demeanor, alert and oriented x3    Data Reviewed:  I have personally reviewed following labs and imaging studies  Micro Results Recent Results (from the past 240 hour(s))  Resp Panel by RT-PCR (Flu A&B, Covid) Nasopharyngeal Swab     Status: None   Collection Time: 11/05/20  7:19 AM   Specimen: Nasopharyngeal Swab; Nasopharyngeal(NP) swabs in vial transport medium  Result Value Ref Range Status   SARS Coronavirus 2 by RT PCR NEGATIVE NEGATIVE Final    Comment: (NOTE) SARS-CoV-2 target nucleic acids are NOT DETECTED.  The SARS-CoV-2 RNA is generally detectable in upper respiratory specimens during the acute phase of infection. The lowest concentration of SARS-CoV-2 viral copies this assay can detect is 138 copies/mL. A negative result does not preclude SARS-Cov-2 infection and should not be used as the sole basis for treatment or other patient management decisions. A negative result may occur with  improper specimen collection/handling, submission of specimen other than nasopharyngeal swab, presence of viral mutation(s) within the areas targeted by this assay, and inadequate number of viral copies(<138 copies/mL). A negative result must be combined with clinical observations, patient history, and epidemiological information. The expected result is Negative.  Fact Sheet for Patients:  EntrepreneurPulse.com.au  Fact Sheet for Healthcare Providers:  IncredibleEmployment.be  This test is no t yet approved or cleared by the [place] FDA and  has been authorized for detection  and/or diagnosis of SARS-CoV-2 by FDA under an Emergency Use Authorization (EUA). This EUA will remain  in effect (meaning this test can be used) for the duration of the COVID-19 declaration under Section 564(b)(1) of the Act, 21 U.S.C.section 360bbb-3(b)(1), unless  the authorization is terminated  or revoked sooner.       Influenza A by PCR NEGATIVE NEGATIVE Final   Influenza B by PCR NEGATIVE NEGATIVE Final    Comment: (NOTE) The Xpert Xpress SARS-CoV-2/FLU/RSV plus assay is intended as an aid in the diagnosis of influenza from Nasopharyngeal swab specimens and should not be used as a sole basis for treatment. Nasal washings and aspirates are unacceptable for Xpert Xpress SARS-CoV-2/FLU/RSV testing.  Fact Sheet for Patients: EntrepreneurPulse.com.au  Fact Sheet for Healthcare Providers: IncredibleEmployment.be  This test is not yet approved or cleared by the [place] FDA and has been authorized for detection and/or diagnosis of SARS-CoV-2 by FDA under an Emergency Use Authorization (EUA). This EUA will remain in effect (meaning this test can be used) for the duration of the COVID-19 declaration under Section 564(b)(1) of the Act, 21 U.S.C. section 360bbb-3(b)(1), unless the authorization is terminated or revoked.  Performed at [facility], [address] [place], [place] [address]   Urine culture     Status: None   Collection Time: 11/05/20  7:26 AM   Specimen: In/Out Cath Urine  Result Value Ref Range Status   Specimen Description   Final    IN/OUT CATH URINE Performed at [facility], [address] [place], [place] [address]    Special Requests   Final    NONE Performed at [facility], [address] [place], [place] [address]    Culture   Final    NO GROWTH Performed at [facility], [place] [address], [place], [place] [address]    Report Status 11/06/2020 FINAL  Final  Blood  Culture (routine x 2)     Status: None (Preliminary result)   Collection Time: 11/05/20  7:27 AM   Specimen: BLOOD RIGHT HAND  Result Value Ref Range Status   Specimen Description   Final    BLOOD RIGHT HAND Performed at [facility], [address] [place], [place] [address]    Special Requests   Final    BOTTLES DRAWN AEROBIC AND ANAEROBIC Blood Culture adequate volume Performed at [facility], [address] [place], [place] [address]    Culture  Setup Time   Final    GRAM POSITIVE RODS IN BOTH AEROBIC AND ANAEROBIC BOTTLES CRITICAL RESULT CALLED TO, READ BACK BY AND VERIFIED WITH: PHARMD HALEY B. [id number] FCP    Culture   Final    GRAM POSITIVE RODS IDENTIFICATION TO FOLLOW Performed at [facility], [place] [address], [place], [place] [address]    Report Status PENDING  Incomplete  MRSA PCR Screening     Status: None   Collection Time: 11/05/20 11:19 AM   Specimen: Nasal Mucosa; Nasopharyngeal  Result Value Ref Range Status   MRSA by PCR NEGATIVE NEGATIVE Final    Comment:        The GeneXpert MRSA Assay (FDA approved for NASAL specimens only), is one component of a comprehensive MRSA colonization surveillance program. It is not intended to diagnose MRSA infection nor to guide or monitor treatment for MRSA infections. Performed at [facility], [place] [address] [place], [place] [address]     Radiology Reports CT CHEST WO CONTRAST  Result Date: 11/05/2020 CLINICAL DATA:  86 year old female with history of respiratory failure. Low back pain. EXAM: CT CHEST, ABDOMEN AND PELVIS WITHOUT CONTRAST TECHNIQUE: Multidetector CT imaging of the  chest, abdomen and pelvis was performed following the standard protocol without IV contrast. COMPARISON:  Chest CT 03/27/2020. CT the abdomen and pelvis 10/29/2013. FINDINGS: CT CHEST FINDINGS Cardiovascular: Heart size is normal. There is no significant pericardial fluid, thickening or pericardial  calcification. There is aortic atherosclerosis, as well as atherosclerosis of the great vessels of the mediastinum and the coronary arteries, including calcified atherosclerotic plaque in the left main, left anterior descending, left circumflex and right coronary arteries. Status post median sternotomy for CABG. Status post TAVR. Mild calcifications of the mitral annulus. Mediastinum/Nodes: No pathologically enlarged mediastinal or hilar lymph nodes. Please note that accurate exclusion of hilar adenopathy is limited on noncontrast CT scans. Esophagus is unremarkable in appearance. No axillary lymphadenopathy. Lungs/Pleura: Status post left lower lobectomy with compensatory hyperexpansion of the left upper lobe. Several small pulmonary nodules are stable in number and size compared to the prior examination, largest of which is a 7 mm left upper lobe pulmonary nodule (axial image 73 of series 4). No other new suspicious appearing pulmonary nodules or masses are noted. Small bilateral pleural effusions lying dependently with some areas of passive subsegmental atelectasis in the right lower lobe and dependent portion of the left upper lobe. Musculoskeletal: Median sternotomy wires. There are no aggressive appearing lytic or blastic lesions noted in the visualized portions of the skeleton. CT ABDOMEN PELVIS FINDINGS Hepatobiliary: No definite suspicious cystic or solid hepatic lesions are confidently identified on today's noncontrast CT examination. Status post cholecystectomy. Pancreas: No definite pancreatic mass or peripancreatic fluid collections. Peripancreatic inflammatory changes noted lateral to the head of the pancreas (in close association with the duodenum and porta hepatis). Spleen: Unremarkable. Adrenals/Urinary Tract: No calcifications are identified in the collecting system of either kidney, along the course of either ureter, or within the lumen of the urinary bladder. No hydroureteronephrosis. Multiple  renal lesions bilaterally, incompletely characterized on today's non-contrast CT examination, but likely to represent cysts, largest of which is exophytic extending off the upper pole of the right kidney measuring 5.5 cm in diameter. Unenhanced appearance of the urinary bladder is normal. Bilateral adrenal glands are normal in appearance. Stomach/Bowel: Unenhanced appearance of the stomach is normal. Inflammatory changes noted surrounding the duodenum, extending into the adjacent porta hepatis. No pathologic dilatation of small bowel or colon. Numerous colonic diverticulae are noted, without surrounding inflammatory changes to suggest a colonic diverticulitis at this time. Normal appendix. Vascular/Lymphatic: Aortic atherosclerosis. No lymphadenopathy noted in the abdomen or pelvis. Reproductive: Uterus and ovaries are unremarkable in appearance. Other: Trace volume of ascites adjacent to the liver. No pneumoperitoneum. Musculoskeletal: There are no aggressive appearing lytic or blastic lesions noted in the visualized portions of the skeleton. IMPRESSION: 1. Inflammatory changes surrounding the duodenum, and adjacent to the head of the pancreas, with extension into the region of the porta hepatis. Findings are concerning for probable duodenitis. No inflammatory changes are noted adjacent to the body or tail of the pancreas, but correlation with lipase levels is also recommended. 2. Small bilateral pleural effusions lying dependently with some associated passive subsegmental atelectasis in the dependent portions of the lungs. 3. Trace volume of ascites most evident adjacent to the liver. 4. Colonic diverticulosis without evidence of acute diverticulitis at this time. 5. Aortic atherosclerosis, in addition to left main and 3 vessel coronary artery disease. Status post CABG and TAVR. 6. All previously noted small pulmonary nodules are stable compared to prior examinations dating back to at least 2019, considered  benign. 7. Additional incidental findings,  as above. Electronically Signed   By: Vinnie Langton M.D.   On: 11/05/2020 16:48   DG Chest Port 1 View  Result Date: 11/06/2020 CLINICAL DATA:  Hypoxia EXAM: PORTABLE CHEST 1 VIEW COMPARISON:  11/05/2020 FINDINGS: Prior CABG and TAVR. Mild cardiomegaly, vascular congestion. Interstitial prominence throughout the lungs, increasing since prior study, favor interstitial edema. Small left pleural effusion. No pneumothorax or acute bony abnormality. IMPRESSION: Increasing vascular congestion and interstitial prominence, likely interstitial edema. Small left pleural effusion. Electronically Signed   By: Rolm Baptise M.D.   On: 11/06/2020 08:07   DG Chest Port 1 View  Result Date: 11/05/2020 CLINICAL DATA:  Questionable sepsis EXAM: PORTABLE CHEST 1 VIEW COMPARISON:  09/16/2016 FINDINGS: Normal heart size and mediastinal contours. Aortic valve replacement with interval revision. Indistinct density at the bases with small pleural effusion seen laterally on the left. No air bronchogram. No pneumothorax. IMPRESSION: 1. Small left pleural effusion. 2. Hazy density at the bases could be atelectasis or mild infiltrate. Electronically Signed   By: Monte Fantasia M.D.   On: 11/05/2020 07:51   ECHOCARDIOGRAM COMPLETE  Result Date: 11/05/2020    ECHOCARDIOGRAM REPORT   Patient Name:   Tracy Buck Date of Exam: 11/05/2020 Medical Rec #:  [id number]    Height:       63.0 in Accession #:    [id number]   Weight:       172.4 lb Date of Birth:  [birth year]    BSA:          1.815 m Patient Age:    57 years     BP:           105/77 mmHg Patient Gender: F            HR:           93 bpm. Exam Location:  Inpatient Procedure: 2D Echo, Cardiac Doppler and Color Doppler Indications:    Abnormal EKG  History:        Patient has prior history of Echocardiogram examinations, most                 recent 09/17/2016. Previous Myocardial Infarction, Prior CABG;                 Arrythmias:Atrial  Fibrillation. S/p AVR.                 Aortic Valve: 23 mm CoreValve-Evolut Pro prosthetic, stented                 (TAVR) valve is present in the aortic position. Procedure Date:                 05/01/2020.  Sonographer:    Luisa Hart RDCS Referring Phys: [place]  1. S/p TAVR (23 mm evolout pro). MG slightly higher across prosthesis likely due to Afib with RVR and sepsis. MR is now moderate to severe. TR is now moderate to severe. Would have low threshold for TEE if the patient develops bacteremia with prosthetic  AVR and change in MR/TR. No definitive vegetation seen on this TTE.  2. 23 mm evolute pro (ViV procedure for prior 21 mm Magna ease) in the aortic position. Vmax 2.8 m/s, MG 21 mmHG, EOA 1.18 cm2, DI 0.28. MG slightly higher than what was reported at [facility] (15 mmHG 05/30/2020), likely due to Afib and sepsis. The AT is slightly prolonged ~114 msec. If bacteremia is present would recommend TEE. The aortic valve  has been repaired/replaced. Aortic valve regurgitation is not visualized. There is a 23 mm CoreValve-Evolut Pro prosthetic (TAVR) valve present in the aortic position. Procedure Date: 05/01/2020.  3. Left ventricular ejection fraction, by estimation, is 50 to 55%. The left ventricle has low normal function. The left ventricle has no regional wall motion abnormalities. There is mild concentric left ventricular hypertrophy. Left ventricular diastolic function could not be evaluated.  4. Right ventricular systolic function is moderately reduced. The right ventricular size is mildly enlarged. There is moderately elevated pulmonary artery systolic pressure. The estimated right ventricular systolic pressure is 41.6 mmHg.  5. Right atrial size was mildly dilated.  6. The mitral valve is degenerative. Moderate to severe mitral valve regurgitation. No evidence of mitral stenosis.  7. The tricuspid valve is abnormal. Tricuspid valve regurgitation is moderate to severe.  8. The inferior  vena cava is dilated in size with >50% respiratory variability, suggesting right atrial pressure of 8 mmHg. Comparison(s): Changes from prior study are noted. FINDINGS  Left Ventricle: Left ventricular ejection fraction, by estimation, is 50 to 55%. The left ventricle has low normal function. The left ventricle has no regional wall motion abnormalities. The left ventricular internal cavity size was normal in size. There is mild concentric left ventricular hypertrophy. Left ventricular diastolic function could not be evaluated due to atrial fibrillation. Left ventricular diastolic function could not be evaluated. Right Ventricle: The right ventricular size is mildly enlarged. No increase in right ventricular wall thickness. Right ventricular systolic function is moderately reduced. There is moderately elevated pulmonary artery systolic pressure. The tricuspid regurgitant velocity is 3.27 m/s, and with an assumed right atrial pressure of 8 mmHg, the estimated right ventricular systolic pressure is 60.6 mmHg. Left Atrium: Left atrial size was normal in size. Right Atrium: Right atrial size was mildly dilated. Prominent Crista terminalis. Pericardium: Trivial pericardial effusion is present. Presence of pericardial fat pad. Mitral Valve: The mitral valve is degenerative in appearance. Moderate to severe mitral valve regurgitation. No evidence of mitral valve stenosis. MV peak gradient, 13.4 mmHg. The mean mitral valve gradient is 6.0 mmHg. Tricuspid Valve: The tricuspid valve is abnormal. Tricuspid valve regurgitation is moderate to severe. No evidence of tricuspid stenosis. Aortic Valve: 23 mm evolute pro (ViV procedure for prior 21 mm Magna ease) in the aortic position. Vmax 2.8 m/s, MG 21 mmHG, EOA 1.18 cm2, DI 0.28. MG slightly higher than what was reported at [facility] (15 mmHG 05/30/2020), likely due to Afib and sepsis. The AT is slightly prolonged ~114 msec. If bacteremia is present would recommend TEE. The aortic  valve has been repaired/replaced. Aortic valve regurgitation is not visualized. Aortic valve mean gradient measures 21.1 mmHg. Aortic valve peak gradient measures 32.3 mmHg. Aortic valve area, by VTI measures 1.18 cm. There is a 23 mm CoreValve-Evolut Pro prosthetic, stented (TAVR) valve present in the aortic position. Procedure Date: 05/01/2020. Pulmonic Valve: The pulmonic valve was grossly normal. Pulmonic valve regurgitation is not visualized. No evidence of pulmonic stenosis. Aorta: The aortic root and ascending aorta are structurally normal, with no evidence of dilitation. Venous: The inferior vena cava is dilated in size with greater than 50% respiratory variability, suggesting right atrial pressure of 8 mmHg. IAS/Shunts: The atrial septum is grossly normal.  LEFT VENTRICLE PLAX 2D LVIDd:         3.90 cm LVIDs:         2.90 cm LV PW:         1.30 cm LV IVS:  1.38 cm LVOT diam:     2.30 cm LV SV:         61 LV SV Index:   34 LVOT Area:     4.15 cm  LV Volumes (MOD) LV vol d, MOD A2C: 39.2 ml LV vol d, MOD A4C: 72.5 ml LV vol s, MOD A2C: 20.1 ml LV vol s, MOD A4C: 31.4 ml LV SV MOD A2C:     19.1 ml LV SV MOD A4C:     72.5 ml LV SV MOD BP:      27.4 ml RIGHT VENTRICLE RV S prime:     7.51 cm/s TAPSE (M-mode): 1.1 cm LEFT ATRIUM             Index       RIGHT ATRIUM           Index LA Vol (A2C):   81.2 ml 44.73 ml/m RA Area:     19.80 cm LA Vol (A4C):   58.1 ml 32.00 ml/m RA Volume:   59.40 ml  32.72 ml/m LA Biplane Vol: 68.5 ml 37.73 ml/m  AORTIC VALVE                    PULMONIC VALVE AV Area (Vmax):    1.22 cm     PV Vmax:       0.80 m/s AV Area (Vmean):   1.20 cm     PV Vmean:      59.400 cm/s AV Area (VTI):     1.18 cm     PV VTI:        0.134 m AV Vmax:           284.16 cm/s  PV Peak grad:  2.6 mmHg AV Vmean:          201.916 cm/s PV Mean grad:  2.0 mmHg AV VTI:            0.517 m AV Peak Grad:      32.3 mmHg AV Mean Grad:      21.1 mmHg LVOT Vmax:         83.31 cm/s LVOT Vmean:        58.377  cm/s LVOT VTI:          0.147 m LVOT/AV VTI ratio: 0.28  AORTA Ao Root diam: 3.50 cm Ao Asc diam:  3.15 cm MITRAL VALVE                 TRICUSPID VALVE MV Peak grad: 13.4 mmHg      TR Peak grad:   42.8 mmHg MV Mean grad: 6.0 mmHg       TR Vmax:        327.00 cm/s MV Vmax:      1.83 m/s MV Vmean:     108.0 cm/s     SHUNTS MR Peak grad:    125.4 mmHg  Systemic VTI:  0.15 m MR Mean grad:    78.0 mmHg   Systemic Diam: 2.30 cm MR Vmax:         560.00 cm/s MR Vmean:        417.0 cm/s MR PISA:         2.26 cm MR PISA Eff ROA: 9 mm MR PISA Radius:  0.60 cm Eleonore Chiquito MD Electronically signed by Eleonore Chiquito MD Signature Date/Time: 11/05/2020/5:22:55 PM    Final    CT RENAL STONE STUDY  Result Date: 11/05/2020 CLINICAL DATA:  86 year old female with history of  respiratory failure. Low back pain. EXAM: CT CHEST, ABDOMEN AND PELVIS WITHOUT CONTRAST TECHNIQUE: Multidetector CT imaging of the chest, abdomen and pelvis was performed following the standard protocol without IV contrast. COMPARISON:  Chest CT 03/27/2020. CT the abdomen and pelvis 10/29/2013. FINDINGS: CT CHEST FINDINGS Cardiovascular: Heart size is normal. There is no significant pericardial fluid, thickening or pericardial calcification. There is aortic atherosclerosis, as well as atherosclerosis of the great vessels of the mediastinum and the coronary arteries, including calcified atherosclerotic plaque in the left main, left anterior descending, left circumflex and right coronary arteries. Status post median sternotomy for CABG. Status post TAVR. Mild calcifications of the mitral annulus. Mediastinum/Nodes: No pathologically enlarged mediastinal or hilar lymph nodes. Please note that accurate exclusion of hilar adenopathy is limited on noncontrast CT scans. Esophagus is unremarkable in appearance. No axillary lymphadenopathy. Lungs/Pleura: Status post left lower lobectomy with compensatory hyperexpansion of the left upper lobe. Several small pulmonary  nodules are stable in number and size compared to the prior examination, largest of which is a 7 mm left upper lobe pulmonary nodule (axial image 73 of series 4). No other new suspicious appearing pulmonary nodules or masses are noted. Small bilateral pleural effusions lying dependently with some areas of passive subsegmental atelectasis in the right lower lobe and dependent portion of the left upper lobe. Musculoskeletal: Median sternotomy wires. There are no aggressive appearing lytic or blastic lesions noted in the visualized portions of the skeleton. CT ABDOMEN PELVIS FINDINGS Hepatobiliary: No definite suspicious cystic or solid hepatic lesions are confidently identified on today's noncontrast CT examination. Status post cholecystectomy. Pancreas: No definite pancreatic mass or peripancreatic fluid collections. Peripancreatic inflammatory changes noted lateral to the head of the pancreas (in close association with the duodenum and porta hepatis). Spleen: Unremarkable. Adrenals/Urinary Tract: No calcifications are identified in the collecting system of either kidney, along the course of either ureter, or within the lumen of the urinary bladder. No hydroureteronephrosis. Multiple renal lesions bilaterally, incompletely characterized on today's non-contrast CT examination, but likely to represent cysts, largest of which is exophytic extending off the upper pole of the right kidney measuring 5.5 cm in diameter. Unenhanced appearance of the urinary bladder is normal. Bilateral adrenal glands are normal in appearance. Stomach/Bowel: Unenhanced appearance of the stomach is normal. Inflammatory changes noted surrounding the duodenum, extending into the adjacent porta hepatis. No pathologic dilatation of small bowel or colon. Numerous colonic diverticulae are noted, without surrounding inflammatory changes to suggest a colonic diverticulitis at this time. Normal appendix. Vascular/Lymphatic: Aortic atherosclerosis. No  lymphadenopathy noted in the abdomen or pelvis. Reproductive: Uterus and ovaries are unremarkable in appearance. Other: Trace volume of ascites adjacent to the liver. No pneumoperitoneum. Musculoskeletal: There are no aggressive appearing lytic or blastic lesions noted in the visualized portions of the skeleton. IMPRESSION: 1. Inflammatory changes surrounding the duodenum, and adjacent to the head of the pancreas, with extension into the region of the porta hepatis. Findings are concerning for probable duodenitis. No inflammatory changes are noted adjacent to the body or tail of the pancreas, but correlation with lipase levels is also recommended. 2. Small bilateral pleural effusions lying dependently with some associated passive subsegmental atelectasis in the dependent portions of the lungs. 3. Trace volume of ascites most evident adjacent to the liver. 4. Colonic diverticulosis without evidence of acute diverticulitis at this time. 5. Aortic atherosclerosis, in addition to left main and 3 vessel coronary artery disease. Status post CABG and TAVR. 6. All previously noted small pulmonary nodules  are stable compared to prior examinations dating back to at least 2019, considered benign. 7. Additional incidental findings, as above. Electronically Signed   By: Vinnie Langton M.D.   On: 11/05/2020 16:48    Lab Data:  CBC: Recent Labs  Lab 11/05/20 0649 11/05/20 0730 11/05/20 1148 11/06/20 0137 11/07/20 0117  WBC 20.5*  --  26.2* 19.1* 14.5*  NEUTROABS 17.5*  --  25.4*  --   --   HGB 12.7 12.9 10.7* 10.8* 11.3*  HCT 38.6 38.0 30.9* 30.6* 32.1*  MCV 90.8  --  87.0 86.0 86.5  PLT 161  --  108* 138* 094   Basic Metabolic Panel: Recent Labs  Lab 11/05/20 0649 11/05/20 0730 11/05/20 1148 11/06/20 0137  NA 129* 131* 127* 132*  K 3.7 3.3* 3.1* 4.3  CL 99  --  101 102  CO2 11*  --  18* 24  GLUCOSE 278*  --  176* 121*  BUN 16  --  15 12  CREATININE 1.33*  --  1.20* 0.93  CALCIUM 8.2*  --  7.8*  7.7*  MG  --   --  1.7 2.2  PHOS  --   --  2.5 3.5   GFR: Estimated Creatinine Clearance: 45.4 mL/min (by C-G formula based on SCr of 0.93 mg/dL). Liver Function Tests: Recent Labs  Lab 11/05/20 0649 11/05/20 1148 11/06/20 0137 11/07/20 0117  AST 77* 83* 58* 43*  ALT 41 52* 45* 50*  ALKPHOS 162* 139* 130* 110  BILITOT 1.9* 2.8* 2.3* 1.7*  PROT 5.8* 4.6* 4.9* 5.0*  ALBUMIN 2.5* 2.0* 2.0* 2.0*   Recent Labs  Lab 11/05/20 1823  LIPASE 25   No results for input(s): AMMONIA in the last 168 hours. Coagulation Profile: Recent Labs  Lab 11/05/20 0726  INR 1.3*   Cardiac Enzymes: No results for input(s): CKTOTAL, CKMB, CKMBINDEX, TROPONINI in the last 168 hours. BNP (last 3 results) No results for input(s): PROBNP in the last 8760 hours. HbA1C: No results for input(s): HGBA1C in the last 72 hours. CBG: Recent Labs  Lab 11/05/20 1155 11/05/20 1600 11/05/20 1936 11/06/20 0333  GLUCAP 179* 164* 133* 107*   Lipid Profile: No results for input(s): CHOL, HDL, LDLCALC, TRIG, CHOLHDL, LDLDIRECT in the last 72 hours. Thyroid Function Tests: No results for input(s): TSH, T4TOTAL, FREET4, T3FREE, THYROIDAB in the last 72 hours. Anemia Panel: No results for input(s): VITAMINB12, FOLATE, FERRITIN, TIBC, IRON, RETICCTPCT in the last 72 hours. Urine analysis:    Component Value Date/Time   COLORURINE YELLOW 11/05/2020 0726   APPEARANCEUR CLEAR 11/05/2020 0726   LABSPEC >1.030 (H) 11/05/2020 0726   PHURINE 5.5 11/05/2020 0726   GLUCOSEU 100 (A) 11/05/2020 0726   HGBUR SMALL (A) 11/05/2020 0726   BILIRUBINUR SMALL (A) 11/05/2020 0726   KETONESUR NEGATIVE 11/05/2020 0726   PROTEINUR 30 (A) 11/05/2020 0726   NITRITE NEGATIVE 11/05/2020 0726   LEUKOCYTESUR NEGATIVE 11/05/2020 0726     Ryaan Vanwagoner M.D. [facility] 11/07/2020, 10:31 AM  Available via Epic secure chat 7am-7pm After 7 pm, please refer to night coverage provider listed on amion.

## 2020-11-07 NOTE — Progress Notes (Signed)
Physical Therapy Treatment Patient Details Name: Tracy Buck MRN: [id number] DOB: 10/24/1934 Today's Date: 11/07/2020    History of Present Illness 86 yo admitted to [facility] 3/16 with sepsis and hypoxia requiring Bipap with transfer to [facility] pt with Afib with RVR. PMhx: COPD, HTN, TAVR, CAD, lung CA s/p lobectomy, HLD    PT Comments    Pt received in bed on 1L O2, SpO2 96%. Min assist bed mobility, min assist transfers, and min/HHA + IV pole support ambulation 12' on RA SpO2 90%. Pt fatigues quickly. 1L O2 replaced at end of session with SpO2 96%. Pt assisted to wheelchair for transfer from 70M to 2W. Pt with c/o nausea at end of session. Emesis bag provided and RN notified. Daughter present in room and engaged in session.   Follow Up Recommendations  Home health PT;Supervision/Assistance - 24 hour     Equipment Recommendations  None recommended by PT    Recommendations for Other Services       Precautions / Restrictions Precautions Precautions: Fall    Mobility  Bed Mobility Overal bed mobility: Needs Assistance Bed Mobility: Supine to Sit     Supine to sit: Min assist;HOB elevated     General bed mobility comments: assist to elevate trunk, cues for sequencing, increased time    Transfers Overall transfer level: Needs assistance Equipment used: 1 person hand held assist Transfers: Sit to/from Stand Sit to Stand: Min assist         General transfer comment: assist to power up, increased time to stabilize balance  Ambulation/Gait Ambulation/Gait assistance: Min assist Gait Distance (Feet): 12 Feet Assistive device: 1 person hand held assist;IV Pole Gait Pattern/deviations: Step-through pattern;Decreased stride length Gait velocity: decreased   General Gait Details: slow, guarded gait, HHA on R from therapist, pt using IV pole for support on L with daughter assisting with pushing IV pole   Stairs             Wheelchair Mobility     Modified Rankin (Stroke Patients Only)       Balance Overall balance assessment: Needs assistance   Sitting balance-Leahy Scale: Fair Sitting balance - Comments: pt able to sit EOB without UE support   Standing balance support: Bilateral upper extremity supported;Single extremity supported;During functional activity Standing balance-Leahy Scale: Poor Standing balance comment: reliant on external support                            Cognition Arousal/Alertness: Awake/alert Behavior During Therapy: WFL for tasks assessed/performed Overall Cognitive Status: Within Functional Limits for tasks assessed                                        Exercises      General Comments General comments (skin integrity, edema, etc.): SpO2 96% at rest on 1L. Mobilized on RA with desat to 90%. Pt fatigues quickly. 1L O2 replaced at end of session with SpO2 96%.      Pertinent Vitals/Pain Pain Assessment: No/denies pain    Home Living                      Prior Function            PT Goals (current goals can now be found in the care plan section) Acute Rehab PT Goals Patient Stated Goal: home  Progress towards PT goals: Progressing toward goals    Frequency    Min 3X/week      PT Plan Current plan remains appropriate    Co-evaluation              AM-PAC PT "6 Clicks" Mobility   Outcome Measure  Help needed turning from your back to your side while in a flat bed without using bedrails?: A Little Help needed moving from lying on your back to sitting on the side of a flat bed without using bedrails?: A Little Help needed moving to and from a bed to a chair (including a wheelchair)?: A Little Help needed standing up from a chair using your arms (e.g., wheelchair or bedside chair)?: A Little Help needed to walk in hospital room?: A Little Help needed climbing 3-5 steps with a railing? : A Lot 6 Click Score: 17    End of Session  Equipment Utilized During Treatment: Gait belt;Oxygen Activity Tolerance: Patient tolerated treatment well Patient left: in chair;with call bell/phone within reach;with family/visitor present Nurse Communication: Mobility status PT Visit Diagnosis: Other abnormalities of gait and mobility (R26.89);Difficulty in walking, not elsewhere classified (R26.2);Muscle weakness (generalized) (M62.81)     Time: 1106-1130 PT Time Calculation (min) (ACUTE ONLY): 24 min  Charges:  $Gait Training: 23-37 mins                     Lorrin Goodell, [place]  Office # [phone] Pager [place], [place] 11/07/2020, 11:59 AM

## 2020-11-08 DIAGNOSIS — J9601 Acute respiratory failure with hypoxia: Secondary | ICD-10-CM | POA: Diagnosis not present

## 2020-11-08 DIAGNOSIS — I4891 Unspecified atrial fibrillation: Secondary | ICD-10-CM | POA: Diagnosis not present

## 2020-11-08 DIAGNOSIS — A419 Sepsis, unspecified organism: Secondary | ICD-10-CM | POA: Diagnosis not present

## 2020-11-08 DIAGNOSIS — E872 Acidosis: Secondary | ICD-10-CM | POA: Diagnosis not present

## 2020-11-08 DIAGNOSIS — E440 Moderate protein-calorie malnutrition: Secondary | ICD-10-CM

## 2020-11-08 DIAGNOSIS — R778 Other specified abnormalities of plasma proteins: Secondary | ICD-10-CM | POA: Diagnosis not present

## 2020-11-08 DIAGNOSIS — N179 Acute kidney failure, unspecified: Secondary | ICD-10-CM | POA: Diagnosis not present

## 2020-11-08 LAB — HEPATIC FUNCTION PANEL
ALT: 35 U/L (ref 0–44)
AST: 23 U/L (ref 15–41)
Albumin: 2 g/dL — ABNORMAL LOW (ref 3.5–5.0)
Alkaline Phosphatase: 100 U/L (ref 38–126)
Bilirubin, Direct: 0.4 mg/dL — ABNORMAL HIGH (ref 0.0–0.2)
Indirect Bilirubin: 1.1 mg/dL — ABNORMAL HIGH (ref 0.3–0.9)
Total Bilirubin: 1.5 mg/dL — ABNORMAL HIGH (ref 0.3–1.2)
Total Protein: 5.3 g/dL — ABNORMAL LOW (ref 6.5–8.1)

## 2020-11-08 LAB — CBC
HCT: 34.2 % — ABNORMAL LOW (ref 36.0–46.0)
Hemoglobin: 11.5 g/dL — ABNORMAL LOW (ref 12.0–15.0)
MCH: 29.5 pg (ref 26.0–34.0)
MCHC: 33.6 g/dL (ref 30.0–36.0)
MCV: 87.7 fL (ref 80.0–100.0)
Platelets: 170 10*3/uL (ref 150–400)
RBC: 3.9 MIL/uL (ref 3.87–5.11)
RDW: 14.9 % (ref 11.5–15.5)
WBC: 13.3 10*3/uL — ABNORMAL HIGH (ref 4.0–10.5)
nRBC: 0 % (ref 0.0–0.2)

## 2020-11-08 NOTE — Progress Notes (Signed)
Pharmacy Antibiotic Note  Tracy Buck is a 86 y.o. female admitted on 11/05/2020 with sepsis. Patient initially on vanc + cefepime. Pharmacy was consulted for Zosyn dosing for intraabdominal infection.    Patient remains afebrile, WBC 13.3. Blood cultures with GPRs in 2/2 bottles (one set). Currently Day 4 of antibiotics on Zosyn.   Plan: Continue Zosyn 3.375 gm IV Q 8 hrs (extended infusion) - Monitor WBC, temp, clinical improvement, cultures, renal function - F/u LOT, likely 7-10 days  - F/u transition to orals, likely Augmentin, closer to discharge   Height: 5\' 3"  (160 cm) Weight: 78.2 kg (172 lb 6.4 oz) IBW/kg (Calculated) : 52.4  Temp (24hrs), Avg:98.1 F (36.7 C), Min:97.6 F (36.4 C), Max:98.4 F (36.9 C)  Recent Labs  Lab 11/05/20 0649 11/05/20 0650 11/05/20 1148 11/05/20 1508 11/06/20 0137 11/07/20 0117 11/08/20 0159  WBC 20.5*  --  26.2*  --  19.1* 14.5* 13.3*  CREATININE 1.33*  --  1.20*  --  0.93  --   --   LATICACIDVEN  --  8.7* 2.2* 2.3*  --   --   --     Estimated Creatinine Clearance: 45.4 mL/min (by C-G formula based on SCr of 0.93 mg/dL).    Allergies  Allergen Reactions  . Fig Extract [Ficus] Anaphylaxis  . Citalopram Hydrobromide Other (See Comments)    Pt unsure if this is accurate  . Sulfa Antibiotics Other (See Comments)    Severe nervousness.  . Prednisone Other (See Comments)    Other reaction(s): Other (See Comments) So nervous I could climb the walls So nervous I could climb the walls     Antimicrobials this admission: Vancomycin 3/16>>3/17 Cefepime 3/16 Flagyl x 1 3/16 Zosyn 3/16 >>   Microbiology results: 3/16 COVID, flu A, flu B: all negative 3/16 MRSA PCR: negative 3/16 Urine cx: negative  3/16 BCx X 1: GPRs   Claudina Lick, PharmD PGY1 Acute Care Pharmacy Resident 11/08/2020 10:32 AM  Please check [email or web address] for unit-specific pharmacy phone numbers.

## 2020-11-08 NOTE — Progress Notes (Signed)
[facility]                                                                              Patient Demographics  Tracy Buck, is a 86 y.o. female, DOB - 20-Sep-1934, HWE:[id number]  Admit date - 11/05/2020   Admitting Physician Candee Furbish, MD  Outpatient Primary MD for the patient is Drake Leach, MD  Outpatient specialists:   LOS - 3  days   Medical records reviewed and are as summarized below:    Chief Complaint  Patient presents with  . Shortness of Breath       Brief summary   Patient is a 86 year old female with history of COPD, hypertension, bioprosthetic aortic valve, 2021, CAD and lung cancer (carcinoid) s/p lobectomy. She was in her usual state of health until approximately 3/6 when she developed progressive fatigue. She saw PCP and cardiologist with these complaints without identifiable etiology. It was felt that perhaps polypharmacy was playing a role. Her PRN alprazolam was discontinued and Ambien dose was reduced from 10mg  to 5mg  QHS. On 3/16, early a.m., developed shortness of breath and chest tightness, presented to [facility].  EKG was felt nonischemic, had atrial fibrillation with RVR.  Patient was started on diltiazem bolus and infusion.  Patient also had low-grade fever 100 F, leukocytosis, placed on empiric antibiotic and sepsis work-up was initiated.  Chest x-ray showed left-sided effusion at her former lobectomy site, creatinine 1.3, lactic acid 8.7, troponin 1700, BNP 1682. Patient then became hypotensive and was admitted to ICU.  Severe sepsis was presumed due to duodenitis and antibiotics were narrowed, now on IV Zosyn, for 7 to 10 days.  Cardiology was also consulted for atrial fibrillation with RVR and abnormal echo changes (worsened MR, TAVR position slightly off). Patient was transferred to [facility] service on 11/07/2020.  11/08/2020: Patient was seen alongside patient's daughter.  According to the patient's daughter, patient  looks a lot better.  Patient only complains of mild epigastric/mid abdominal discomfort/pain.  Patient has history of duodenitis.  Blood culture shows growing GPI.  Will await final culture results.  Possibly, these may just be contaminant.  Patient is on IV Zosyn.  Patient has history of bioprosthetic aortic valve.   Assessment & Plan   Principal problem Severe sepsis presumed due to duodenitis, present on admission -Urine culture negative.  Blood culture positive for gram-positive rods, sensitivities pending -CT renal stone study showed inflammatory changes surrounding the duodenum and adjacent to the head of the pancreas with extension into the region of porta hepatis, concerning for probable duodenitis. -Leukocytosis improving, afebrile -IV antibiotics narrowed to Zosyn (previously on IV vancomycin, cefepime and Flagyl), continue for total of 7 to 10 days.  Can narrow to Augmentin closer to discharge. -Advance diet to solids 11/08/2020: See above.  Patient is on IV Zosyn.  Follow final culture results.  Active Problems: Atrial fibrillation with RVR, new onset -Patient was placed on IV Cardizem drip, heart rate improved however patient developed hypotension -Cardiology consulted, heart rate now improving without rate control agent -Patient was placed on IV heparin, now transitioned to apixaban -Elevated troponin likely due to  demand ischemia, atrial fibrillation with RVR, not ACS -2D echo with LVEF of 50 to 55%  Abnormal EKG changes, worsened MR, TR, TAVR position change -Cardiology following, MR is now moderate to severe, TR is now moderate to severe -No plan for TEE unless positive blood cultures.  Blood cultures growing gram-positive rods and may be contaminants.  Will await final cultures.  Acute kidney injury with metabolic acidosis, lactic acidosis -Baseline creatinine 0.9-1.05 -Presented with creatinine of 1.3, lactic acid 8.7 on admission -Patient was placed on bicarb drip,  lactic acid improved to 2.3, bicarb normal 24, creatinine 0.9 -Diet advanced to solids today 11/08/2020: Acute kidney injury has resolved.  Metabolic acidosis has resolved.  Last CO2 was 24.  COPD -Currently stable, no wheezing, continue outpatient inhalers  Hypertension -BP currently stable, holding antihypertensives  Hyperlipidemia Continue statin  Progressive fatigue, dizziness, generalized debility -Patient was seen by her PCP, cardiologist, no identifiable etiology and.  Perhaps polypharmacy playing a role.  Xanax was discontinued.  Ambien dose decreased from 10 mg to 5 mg, now discontinued -Continue melatonin -Would not resume Xanax outpatient, discussed with patient's daughter -Continue PT OT, mobilization, very deconditioned  Malnutrition of moderate degree -Very deconditioned, diet advanced today, albumin 2.0 Estimated body mass index is 30.54 kg/m as calculated from the following:   Height as of this encounter: 5\' 3"  (1.6 m).   Weight as of this encounter: 78.2 kg.  Code Status: Full CODE STATUS DVT Prophylaxis:  SCDs Start: 11/05/20 1021 apixaban (ELIQUIS) tablet 5 mg   Level of Care: Level of care: Progressive, transfer to the floor today Family Communication: Discussed all imaging results, lab results, explained to the patient's daughter, Levada Dy.  Per daughter, patient lives with her, had been driving and functional prior to admission.  Continue physical therapy, goal to return home, may need home health PT   Disposition Plan:     Status is: Inpatient  Remains inpatient appropriate because:Inpatient level of care appropriate due to severity of illness   Dispo: The patient is from: Home              Anticipated d/c is to: Home              Patient currently is not medically stable to d/c.  Diet advanced today, very deconditioned.   Difficult to place patient No      Time Spent in minutes 35 minutes  Procedures:  2D echo  Consultants:   Patient was  admitted by critical care Cardiology  Antimicrobials:   Anti-infectives (From admission, onward)   Start     Dose/Rate Route Frequency Ordered Stop   11/06/20 0600  vancomycin (VANCOREADY) IVPB 750 mg/150 mL  Status:  Discontinued        750 mg 150 mL/hr over 60 Minutes Intravenous Every 24 hours 11/05/20 0757 11/06/20 0745   11/05/20 2000  ceFEPIme (MAXIPIME) 2 g in sodium chloride 0.9 % 100 mL IVPB  Status:  Discontinued        2 g 200 mL/hr over 30 Minutes Intravenous Every 12 hours 11/05/20 0757 11/05/20 1704   11/05/20 1745  piperacillin-tazobactam (ZOSYN) IVPB 3.375 g        3.375 g 12.5 mL/hr over 240 Minutes Intravenous Every 8 hours 11/05/20 1720     11/05/20 0730  ceFEPIme (MAXIPIME) 2 g in sodium chloride 0.9 % 100 mL IVPB        2 g 200 mL/hr over 30 Minutes Intravenous  Once 11/05/20 0716 11/05/20 2409  11/05/20 0730  metroNIDAZOLE (FLAGYL) IVPB 500 mg        500 mg 100 mL/hr over 60 Minutes Intravenous  Once 11/05/20 0716 11/05/20 0850   11/05/20 0730  vancomycin (VANCOCIN) IVPB 1000 mg/200 mL premix        1,000 mg 200 mL/hr over 60 Minutes Intravenous  Once 11/05/20 0716 11/05/20 0914         Medications  Scheduled Meds: . apixaban  5 mg Oral BID  . aspirin EC  81 mg Oral Daily  . Chlorhexidine Gluconate Cloth  6 each Topical Daily  . feeding supplement  1 Container Oral TID BM  . fluticasone furoate-vilanterol  1 puff Inhalation Daily  . loratadine  10 mg Oral Daily  . melatonin  3 mg Oral QHS  . rosuvastatin  20 mg Oral Daily  . umeclidinium bromide  1 puff Inhalation Daily   Continuous Infusions: . sodium chloride Stopped (11/05/20 0847)  . piperacillin-tazobactam (ZOSYN)  IV 3.375 g (11/08/20 0954)   PRN Meds:.sodium chloride, acetaminophen, docusate sodium, fentaNYL (SUBLIMAZE) injection, polyethylene glycol      Subjective:   -Seen alongside patient's daughter. -Reports mild epigastric/mid abdomen pain/discomfort.  Objective:    Vitals:   11/08/20 0400 11/08/20 0748 11/08/20 0817 11/08/20 1007  BP:  136/64    Pulse:   (!) 101   Resp:  20 (!) 32 20  Temp: 98.2 F (36.8 C)     TempSrc: Oral     SpO2:  93% 95%   Weight:      Height:        Intake/Output Summary (Last 24 hours) at 11/08/2020 1010 Last data filed at 11/08/2020 0856 Gross per 24 hour  Intake 690 ml  Output --  Net 690 ml     Wt Readings from Last 3 Encounters:  11/07/20 78.2 kg  08/07/17 78.9 kg  12/04/16 77.1 kg     Exam  General: Patient is not in any distress.  Patient is awake and alert.  Patient continues to look weak and deconditioned.    Cardiovascular: S1 S2   Respiratory: Decreased air entry.  Gastrointestinal: Obese.  Soft and no objective tenderness elicited.  Ext: Bilateral leg edema. Neuro: Patient is awake and alert.  Patient moves all extremities.     Data Reviewed:  I have personally reviewed following labs and imaging studies  Micro Results Recent Results (from the past 240 hour(s))  Resp Panel by RT-PCR (Flu A&B, Covid) Nasopharyngeal Swab     Status: None   Collection Time: 11/05/20  7:19 AM   Specimen: Nasopharyngeal Swab; Nasopharyngeal(NP) swabs in vial transport medium  Result Value Ref Range Status   SARS Coronavirus 2 by RT PCR NEGATIVE NEGATIVE Final    Comment: (NOTE) SARS-CoV-2 target nucleic acids are NOT DETECTED.  The SARS-CoV-2 RNA is generally detectable in upper respiratory specimens during the acute phase of infection. The lowest concentration of SARS-CoV-2 viral copies this assay can detect is 138 copies/mL. A negative result does not preclude SARS-Cov-2 infection and should not be used as the sole basis for treatment or other patient management decisions. A negative result may occur with  improper specimen collection/handling, submission of specimen other than nasopharyngeal swab, presence of viral mutation(s) within the areas targeted by this assay, and inadequate number of  viral copies(<138 copies/mL). A negative result must be combined with clinical observations, patient history, and epidemiological information. The expected result is Negative.  Fact Sheet for Patients:  EntrepreneurPulse.com.au  Fact Sheet  for Healthcare Providers:  IncredibleEmployment.be  This test is no t yet approved or cleared by the [place] and  has been authorized for detection and/or diagnosis of SARS-CoV-2 by FDA under an Emergency Use Authorization (EUA). This EUA will remain  in effect (meaning this test can be used) for the duration of the COVID-19 declaration under Section 564(b)(1) of the Act, 21 U.S.C.section 360bbb-3(b)(1), unless the authorization is terminated  or revoked sooner.       Influenza A by PCR NEGATIVE NEGATIVE Final   Influenza B by PCR NEGATIVE NEGATIVE Final    Comment: (NOTE) The Xpert Xpress SARS-CoV-2/FLU/RSV plus assay is intended as an aid in the diagnosis of influenza from Nasopharyngeal swab specimens and should not be used as a sole basis for treatment. Nasal washings and aspirates are unacceptable for Xpert Xpress SARS-CoV-2/FLU/RSV testing.  Fact Sheet for Patients: EntrepreneurPulse.com.au  Fact Sheet for Healthcare Providers: IncredibleEmployment.be  This test is not yet approved or cleared by the [place] FDA and has been authorized for detection and/or diagnosis of SARS-CoV-2 by FDA under an Emergency Use Authorization (EUA). This EUA will remain in effect (meaning this test can be used) for the duration of the COVID-19 declaration under Section 564(b)(1) of the Act, 21 U.S.C. section 360bbb-3(b)(1), unless the authorization is terminated or revoked.  Performed at [facility], [address] [place], [place] [address]   Urine culture     Status: None   Collection Time: 11/05/20  7:26 AM   Specimen: In/Out Cath Urine  Result  Value Ref Range Status   Specimen Description   Final    IN/OUT CATH URINE Performed at [facility], [address] [place], [place] [address]    Special Requests   Final    NONE Performed at [facility], [address] [place], [place] [address]    Culture   Final    NO GROWTH Performed at [facility], [place] [address], [place], [place] [address]    Report Status 11/06/2020 FINAL  Final  Blood Culture (routine x 2)     Status: None (Preliminary result)   Collection Time: 11/05/20  7:27 AM   Specimen: BLOOD RIGHT HAND  Result Value Ref Range Status   Specimen Description   Final    BLOOD RIGHT HAND Performed at [facility], [address] [place], [place] [address]    Special Requests   Final    BOTTLES DRAWN AEROBIC AND ANAEROBIC Blood Culture adequate volume Performed at [facility], [address] [place], [place] [address]    Culture  Setup Time   Final    GRAM POSITIVE RODS IN BOTH AEROBIC AND ANAEROBIC BOTTLES CRITICAL RESULT CALLED TO, READ BACK BY AND VERIFIED WITH: PHARMD HALEY B. [id number] FCP    Culture   Final    GRAM POSITIVE RODS IDENTIFICATION TO FOLLOW Performed at [facility], [place] [address] [place], [place] [address]    Report Status PENDING  Incomplete  MRSA PCR Screening     Status: None   Collection Time: 11/05/20 11:19 AM   Specimen: Nasal Mucosa; Nasopharyngeal  Result Value Ref Range Status   MRSA by PCR NEGATIVE NEGATIVE Final    Comment:        The GeneXpert MRSA Assay (FDA approved for NASAL specimens only), is one component of a comprehensive MRSA colonization surveillance program. It is  not intended to diagnose MRSA infection nor to guide or monitor treatment for MRSA infections. Performed at [facility], [place] [address], [place], [place] [address]     Radiology Reports CT CHEST WO CONTRAST  Result Date: 11/05/2020 CLINICAL DATA:  86 year old female with  history of respiratory failure. Low back pain. EXAM: CT CHEST, ABDOMEN AND PELVIS WITHOUT CONTRAST TECHNIQUE: Multidetector CT imaging of the chest, abdomen and pelvis was performed following the standard protocol without IV contrast. COMPARISON:  Chest CT 03/27/2020. CT the abdomen and pelvis 10/29/2013. FINDINGS: CT CHEST FINDINGS Cardiovascular: Heart size is normal. There is no significant pericardial fluid, thickening or pericardial calcification. There is aortic atherosclerosis, as well as atherosclerosis of the great vessels of the mediastinum and the coronary arteries, including calcified atherosclerotic plaque in the left main, left anterior descending, left circumflex and right coronary arteries. Status post median sternotomy for CABG. Status post TAVR. Mild calcifications of the mitral annulus. Mediastinum/Nodes: No pathologically enlarged mediastinal or hilar lymph nodes. Please note that accurate exclusion of hilar adenopathy is limited on noncontrast CT scans. Esophagus is unremarkable in appearance. No axillary lymphadenopathy. Lungs/Pleura: Status post left lower lobectomy with compensatory hyperexpansion of the left upper lobe. Several small pulmonary nodules are stable in number and size compared to the prior examination, largest of which is a 7 mm left upper lobe pulmonary nodule (axial image 73 of series 4). No other new suspicious appearing pulmonary nodules or masses are noted. Small bilateral pleural effusions lying dependently with some areas of passive subsegmental atelectasis in the right lower lobe and dependent portion of the left upper lobe. Musculoskeletal: Median sternotomy wires. There are no aggressive appearing lytic or blastic lesions noted in the visualized portions of the skeleton. CT ABDOMEN PELVIS FINDINGS Hepatobiliary: No definite suspicious cystic or solid hepatic lesions are confidently identified on today's noncontrast CT examination. Status post cholecystectomy. Pancreas:  No definite pancreatic mass or peripancreatic fluid collections. Peripancreatic inflammatory changes noted lateral to the head of the pancreas (in close association with the duodenum and porta hepatis). Spleen: Unremarkable. Adrenals/Urinary Tract: No calcifications are identified in the collecting system of either kidney, along the course of either ureter, or within the lumen of the urinary bladder. No hydroureteronephrosis. Multiple renal lesions bilaterally, incompletely characterized on today's non-contrast CT examination, but likely to represent cysts, largest of which is exophytic extending off the upper pole of the right kidney measuring 5.5 cm in diameter. Unenhanced appearance of the urinary bladder is normal. Bilateral adrenal glands are normal in appearance. Stomach/Bowel: Unenhanced appearance of the stomach is normal. Inflammatory changes noted surrounding the duodenum, extending into the adjacent porta hepatis. No pathologic dilatation of small bowel or colon. Numerous colonic diverticulae are noted, without surrounding inflammatory changes to suggest a colonic diverticulitis at this time. Normal appendix. Vascular/Lymphatic: Aortic atherosclerosis. No lymphadenopathy noted in the abdomen or pelvis. Reproductive: Uterus and ovaries are unremarkable in appearance. Other: Trace volume of ascites adjacent to the liver. No pneumoperitoneum. Musculoskeletal: There are no aggressive appearing lytic or blastic lesions noted in the visualized portions of the skeleton. IMPRESSION: 1. Inflammatory changes surrounding the duodenum, and adjacent to the head of the pancreas, with extension into the region of the porta hepatis. Findings are concerning for probable duodenitis. No inflammatory changes are noted adjacent to the body or tail of the pancreas, but correlation with lipase levels is also recommended. 2. Small bilateral pleural effusions lying dependently with some associated passive subsegmental atelectasis  in the dependent portions  of the lungs. 3. Trace volume of ascites most evident adjacent to the liver. 4. Colonic diverticulosis without evidence of acute diverticulitis at this time. 5. Aortic atherosclerosis, in addition to left main and 3 vessel coronary artery disease. Status post CABG and TAVR. 6. All previously noted small pulmonary nodules are stable compared to prior examinations dating back to at least 2019, considered benign. 7. Additional incidental findings, as above. Electronically Signed   By: Vinnie Langton M.D.   On: 11/05/2020 16:48   DG Chest Port 1 View  Result Date: 11/06/2020 CLINICAL DATA:  Hypoxia EXAM: PORTABLE CHEST 1 VIEW COMPARISON:  11/05/2020 FINDINGS: Prior CABG and TAVR. Mild cardiomegaly, vascular congestion. Interstitial prominence throughout the lungs, increasing since prior study, favor interstitial edema. Small left pleural effusion. No pneumothorax or acute bony abnormality. IMPRESSION: Increasing vascular congestion and interstitial prominence, likely interstitial edema. Small left pleural effusion. Electronically Signed   By: Rolm Baptise M.D.   On: 11/06/2020 08:07   DG Chest Port 1 View  Result Date: 11/05/2020 CLINICAL DATA:  Questionable sepsis EXAM: PORTABLE CHEST 1 VIEW COMPARISON:  09/16/2016 FINDINGS: Normal heart size and mediastinal contours. Aortic valve replacement with interval revision. Indistinct density at the bases with small pleural effusion seen laterally on the left. No air bronchogram. No pneumothorax. IMPRESSION: 1. Small left pleural effusion. 2. Hazy density at the bases could be atelectasis or mild infiltrate. Electronically Signed   By: Monte Fantasia M.D.   On: 11/05/2020 07:51   ECHOCARDIOGRAM COMPLETE  Result Date: 11/05/2020    ECHOCARDIOGRAM REPORT   Patient Name:   MARYSSA GIAMPIETRO Date of Exam: 11/05/2020 Medical Rec #:  [id number]    Height:       63.0 in Accession #:    [id number]   Weight:       172.4 lb Date of Birth:  04/02/1935     BSA:          1.815 m Patient Age:    42 years     BP:           105/77 mmHg Patient Gender: F            HR:           93 bpm. Exam Location:  Inpatient Procedure: 2D Echo, Cardiac Doppler and Color Doppler Indications:    Abnormal EKG  History:        Patient has prior history of Echocardiogram examinations, most                 recent 09/17/2016. Previous Myocardial Infarction, Prior CABG;                 Arrythmias:Atrial Fibrillation. S/p AVR.                 Aortic Valve: 23 mm CoreValve-Evolut Pro prosthetic, stented                 (TAVR) valve is present in the aortic position. Procedure Date:                 05/01/2020.  Sonographer:    Luisa Hart RDCS Referring Phys: [place]  1. S/p TAVR (23 mm evolout pro). MG slightly higher across prosthesis likely due to Afib with RVR and sepsis. MR is now moderate to severe. TR is now moderate to severe. Would have low threshold for TEE if the patient develops bacteremia with prosthetic  AVR and change in MR/TR. No definitive  vegetation seen on this TTE.  2. 23 mm evolute pro (ViV procedure for prior 21 mm Magna ease) in the aortic position. Vmax 2.8 m/s, MG 21 mmHG, EOA 1.18 cm2, DI 0.28. MG slightly higher than what was reported at [facility] (15 mmHG 05/30/2020), likely due to Afib and sepsis. The AT is slightly prolonged ~114 msec. If bacteremia is present would recommend TEE. The aortic valve has been repaired/replaced. Aortic valve regurgitation is not visualized. There is a 23 mm CoreValve-Evolut Pro prosthetic (TAVR) valve present in the aortic position. Procedure Date: 05/01/2020.  3. Left ventricular ejection fraction, by estimation, is 50 to 55%. The left ventricle has low normal function. The left ventricle has no regional wall motion abnormalities. There is mild concentric left ventricular hypertrophy. Left ventricular diastolic function could not be evaluated.  4. Right ventricular systolic function is moderately reduced. The right  ventricular size is mildly enlarged. There is moderately elevated pulmonary artery systolic pressure. The estimated right ventricular systolic pressure is 37.6 mmHg.  5. Right atrial size was mildly dilated.  6. The mitral valve is degenerative. Moderate to severe mitral valve regurgitation. No evidence of mitral stenosis.  7. The tricuspid valve is abnormal. Tricuspid valve regurgitation is moderate to severe.  8. The inferior vena cava is dilated in size with >50% respiratory variability, suggesting right atrial pressure of 8 mmHg. Comparison(s): Changes from prior study are noted. FINDINGS  Left Ventricle: Left ventricular ejection fraction, by estimation, is 50 to 55%. The left ventricle has low normal function. The left ventricle has no regional wall motion abnormalities. The left ventricular internal cavity size was normal in size. There is mild concentric left ventricular hypertrophy. Left ventricular diastolic function could not be evaluated due to atrial fibrillation. Left ventricular diastolic function could not be evaluated. Right Ventricle: The right ventricular size is mildly enlarged. No increase in right ventricular wall thickness. Right ventricular systolic function is moderately reduced. There is moderately elevated pulmonary artery systolic pressure. The tricuspid regurgitant velocity is 3.27 m/s, and with an assumed right atrial pressure of 8 mmHg, the estimated right ventricular systolic pressure is 28.3 mmHg. Left Atrium: Left atrial size was normal in size. Right Atrium: Right atrial size was mildly dilated. Prominent Crista terminalis. Pericardium: Trivial pericardial effusion is present. Presence of pericardial fat pad. Mitral Valve: The mitral valve is degenerative in appearance. Moderate to severe mitral valve regurgitation. No evidence of mitral valve stenosis. MV peak gradient, 13.4 mmHg. The mean mitral valve gradient is 6.0 mmHg. Tricuspid Valve: The tricuspid valve is abnormal.  Tricuspid valve regurgitation is moderate to severe. No evidence of tricuspid stenosis. Aortic Valve: 23 mm evolute pro (ViV procedure for prior 21 mm Magna ease) in the aortic position. Vmax 2.8 m/s, MG 21 mmHG, EOA 1.18 cm2, DI 0.28. MG slightly higher than what was reported at [facility] (15 mmHG 05/30/2020), likely due to Afib and sepsis. The AT is slightly prolonged ~114 msec. If bacteremia is present would recommend TEE. The aortic valve has been repaired/replaced. Aortic valve regurgitation is not visualized. Aortic valve mean gradient measures 21.1 mmHg. Aortic valve peak gradient measures 32.3 mmHg. Aortic valve area, by VTI measures 1.18 cm. There is a 23 mm CoreValve-Evolut Pro prosthetic, stented (TAVR) valve present in the aortic position. Procedure Date: 05/01/2020. Pulmonic Valve: The pulmonic valve was grossly normal. Pulmonic valve regurgitation is not visualized. No evidence of pulmonic stenosis. Aorta: The aortic root and ascending aorta are structurally normal, with no evidence of dilitation. Venous: The  inferior vena cava is dilated in size with greater than 50% respiratory variability, suggesting right atrial pressure of 8 mmHg. IAS/Shunts: The atrial septum is grossly normal.  LEFT VENTRICLE PLAX 2D LVIDd:         3.90 cm LVIDs:         2.90 cm LV PW:         1.30 cm LV IVS:        1.38 cm LVOT diam:     2.30 cm LV SV:         61 LV SV Index:   34 LVOT Area:     4.15 cm  LV Volumes (MOD) LV vol d, MOD A2C: 39.2 ml LV vol d, MOD A4C: 72.5 ml LV vol s, MOD A2C: 20.1 ml LV vol s, MOD A4C: 31.4 ml LV SV MOD A2C:     19.1 ml LV SV MOD A4C:     72.5 ml LV SV MOD BP:      27.4 ml RIGHT VENTRICLE RV S prime:     7.51 cm/s TAPSE (M-mode): 1.1 cm LEFT ATRIUM             Index       RIGHT ATRIUM           Index LA Vol (A2C):   81.2 ml 44.73 ml/m RA Area:     19.80 cm LA Vol (A4C):   58.1 ml 32.00 ml/m RA Volume:   59.40 ml  32.72 ml/m LA Biplane Vol: 68.5 ml 37.73 ml/m  AORTIC VALVE                     PULMONIC VALVE AV Area (Vmax):    1.22 cm     PV Vmax:       0.80 m/s AV Area (Vmean):   1.20 cm     PV Vmean:      59.400 cm/s AV Area (VTI):     1.18 cm     PV VTI:        0.134 m AV Vmax:           284.16 cm/s  PV Peak grad:  2.6 mmHg AV Vmean:          201.916 cm/s PV Mean grad:  2.0 mmHg AV VTI:            0.517 m AV Peak Grad:      32.3 mmHg AV Mean Grad:      21.1 mmHg LVOT Vmax:         83.31 cm/s LVOT Vmean:        58.377 cm/s LVOT VTI:          0.147 m LVOT/AV VTI ratio: 0.28  AORTA Ao Root diam: 3.50 cm Ao Asc diam:  3.15 cm MITRAL VALVE                 TRICUSPID VALVE MV Peak grad: 13.4 mmHg      TR Peak grad:   42.8 mmHg MV Mean grad: 6.0 mmHg       TR Vmax:        327.00 cm/s MV Vmax:      1.83 m/s MV Vmean:     108.0 cm/s     SHUNTS MR Peak grad:    125.4 mmHg  Systemic VTI:  0.15 m MR Mean grad:    78.0 mmHg   Systemic Diam: 2.30 cm MR Vmax:  560.00 cm/s MR Vmean:        417.0 cm/s MR PISA:         2.26 cm MR PISA Eff ROA: 9 mm MR PISA Radius:  0.60 cm Eleonore Chiquito MD Electronically signed by Eleonore Chiquito MD Signature Date/Time: 11/05/2020/5:22:55 PM    Final    CT RENAL STONE STUDY  Result Date: 11/05/2020 CLINICAL DATA:  86 year old female with history of respiratory failure. Low back pain. EXAM: CT CHEST, ABDOMEN AND PELVIS WITHOUT CONTRAST TECHNIQUE: Multidetector CT imaging of the chest, abdomen and pelvis was performed following the standard protocol without IV contrast. COMPARISON:  Chest CT 03/27/2020. CT the abdomen and pelvis 10/29/2013. FINDINGS: CT CHEST FINDINGS Cardiovascular: Heart size is normal. There is no significant pericardial fluid, thickening or pericardial calcification. There is aortic atherosclerosis, as well as atherosclerosis of the great vessels of the mediastinum and the coronary arteries, including calcified atherosclerotic plaque in the left main, left anterior descending, left circumflex and right coronary arteries. Status post median sternotomy for  CABG. Status post TAVR. Mild calcifications of the mitral annulus. Mediastinum/Nodes: No pathologically enlarged mediastinal or hilar lymph nodes. Please note that accurate exclusion of hilar adenopathy is limited on noncontrast CT scans. Esophagus is unremarkable in appearance. No axillary lymphadenopathy. Lungs/Pleura: Status post left lower lobectomy with compensatory hyperexpansion of the left upper lobe. Several small pulmonary nodules are stable in number and size compared to the prior examination, largest of which is a 7 mm left upper lobe pulmonary nodule (axial image 73 of series 4). No other new suspicious appearing pulmonary nodules or masses are noted. Small bilateral pleural effusions lying dependently with some areas of passive subsegmental atelectasis in the right lower lobe and dependent portion of the left upper lobe. Musculoskeletal: Median sternotomy wires. There are no aggressive appearing lytic or blastic lesions noted in the visualized portions of the skeleton. CT ABDOMEN PELVIS FINDINGS Hepatobiliary: No definite suspicious cystic or solid hepatic lesions are confidently identified on today's noncontrast CT examination. Status post cholecystectomy. Pancreas: No definite pancreatic mass or peripancreatic fluid collections. Peripancreatic inflammatory changes noted lateral to the head of the pancreas (in close association with the duodenum and porta hepatis). Spleen: Unremarkable. Adrenals/Urinary Tract: No calcifications are identified in the collecting system of either kidney, along the course of either ureter, or within the lumen of the urinary bladder. No hydroureteronephrosis. Multiple renal lesions bilaterally, incompletely characterized on today's non-contrast CT examination, but likely to represent cysts, largest of which is exophytic extending off the upper pole of the right kidney measuring 5.5 cm in diameter. Unenhanced appearance of the urinary bladder is normal. Bilateral adrenal  glands are normal in appearance. Stomach/Bowel: Unenhanced appearance of the stomach is normal. Inflammatory changes noted surrounding the duodenum, extending into the adjacent porta hepatis. No pathologic dilatation of small bowel or colon. Numerous colonic diverticulae are noted, without surrounding inflammatory changes to suggest a colonic diverticulitis at this time. Normal appendix. Vascular/Lymphatic: Aortic atherosclerosis. No lymphadenopathy noted in the abdomen or pelvis. Reproductive: Uterus and ovaries are unremarkable in appearance. Other: Trace volume of ascites adjacent to the liver. No pneumoperitoneum. Musculoskeletal: There are no aggressive appearing lytic or blastic lesions noted in the visualized portions of the skeleton. IMPRESSION: 1. Inflammatory changes surrounding the duodenum, and adjacent to the head of the pancreas, with extension into the region of the porta hepatis. Findings are concerning for probable duodenitis. No inflammatory changes are noted adjacent to the body or tail of the pancreas, but correlation with  lipase levels is also recommended. 2. Small bilateral pleural effusions lying dependently with some associated passive subsegmental atelectasis in the dependent portions of the lungs. 3. Trace volume of ascites most evident adjacent to the liver. 4. Colonic diverticulosis without evidence of acute diverticulitis at this time. 5. Aortic atherosclerosis, in addition to left main and 3 vessel coronary artery disease. Status post CABG and TAVR. 6. All previously noted small pulmonary nodules are stable compared to prior examinations dating back to at least 2019, considered benign. 7. Additional incidental findings, as above. Electronically Signed   By: Vinnie Langton M.D.   On: 11/05/2020 16:48    Lab Data:  CBC: Recent Labs  Lab 11/05/20 0649 11/05/20 0730 11/05/20 1148 11/06/20 0137 11/07/20 0117 11/08/20 0159  WBC 20.5*  --  26.2* 19.1* 14.5* 13.3*  NEUTROABS  17.5*  --  25.4*  --   --   --   HGB 12.7 12.9 10.7* 10.8* 11.3* 11.5*  HCT 38.6 38.0 30.9* 30.6* 32.1* 34.2*  MCV 90.8  --  87.0 86.0 86.5 87.7  PLT 161  --  108* 138* 160 025   Basic Metabolic Panel: Recent Labs  Lab 11/05/20 0649 11/05/20 0730 11/05/20 1148 11/06/20 0137  NA 129* 131* 127* 132*  K 3.7 3.3* 3.1* 4.3  CL 99  --  101 102  CO2 11*  --  18* 24  GLUCOSE 278*  --  176* 121*  BUN 16  --  15 12  CREATININE 1.33*  --  1.20* 0.93  CALCIUM 8.2*  --  7.8* 7.7*  MG  --   --  1.7 2.2  PHOS  --   --  2.5 3.5   GFR: Estimated Creatinine Clearance: 45.4 mL/min (by C-G formula based on SCr of 0.93 mg/dL). Liver Function Tests: Recent Labs  Lab 11/05/20 0649 11/05/20 1148 11/06/20 0137 11/07/20 0117 11/08/20 0159  AST 77* 83* 58* 43* 23  ALT 41 52* 45* 50* 35  ALKPHOS 162* 139* 130* 110 100  BILITOT 1.9* 2.8* 2.3* 1.7* 1.5*  PROT 5.8* 4.6* 4.9* 5.0* 5.3*  ALBUMIN 2.5* 2.0* 2.0* 2.0* 2.0*   Recent Labs  Lab 11/05/20 1823  LIPASE 25   No results for input(s): AMMONIA in the last 168 hours. Coagulation Profile: Recent Labs  Lab 11/05/20 0726  INR 1.3*   Cardiac Enzymes: No results for input(s): CKTOTAL, CKMB, CKMBINDEX, TROPONINI in the last 168 hours. BNP (last 3 results) No results for input(s): PROBNP in the last 8760 hours. HbA1C: No results for input(s): HGBA1C in the last 72 hours. CBG: Recent Labs  Lab 11/05/20 1155 11/05/20 1600 11/05/20 1936 11/06/20 0333  GLUCAP 179* 164* 133* 107*   Lipid Profile: No results for input(s): CHOL, HDL, LDLCALC, TRIG, CHOLHDL, LDLDIRECT in the last 72 hours. Thyroid Function Tests: No results for input(s): TSH, T4TOTAL, FREET4, T3FREE, THYROIDAB in the last 72 hours. Anemia Panel: No results for input(s): VITAMINB12, FOLATE, FERRITIN, TIBC, IRON, RETICCTPCT in the last 72 hours. Urine analysis:    Component Value Date/Time   COLORURINE YELLOW 11/05/2020 0726   APPEARANCEUR CLEAR 11/05/2020 0726    LABSPEC >1.030 (H) 11/05/2020 0726   PHURINE 5.5 11/05/2020 0726   GLUCOSEU 100 (A) 11/05/2020 0726   HGBUR SMALL (A) 11/05/2020 0726   BILIRUBINUR SMALL (A) 11/05/2020 0726   KETONESUR NEGATIVE 11/05/2020 0726   PROTEINUR 30 (A) 11/05/2020 0726   NITRITE NEGATIVE 11/05/2020 0726   LEUKOCYTESUR NEGATIVE 11/05/2020 0726     Yehuda Savannah I  Brendolyn Stockley M.D. [facility] 11/08/2020, 10:10 AM  Available via Epic secure chat 7am-7pm After 7 pm, please refer to night coverage provider listed on amion.

## 2020-11-08 NOTE — Progress Notes (Signed)
Progress Note  Patient Name: Tracy Buck Date of Encounter: 11/08/2020  [facility] Cardiologist:  [facility] Dr. Bishop Limbo  In [facility]  Subjective   She denies any chest pain.  SOB continues to improve  Inpatient Medications    Scheduled Meds: . apixaban  5 mg Oral BID  . aspirin EC  81 mg Oral Daily  . Chlorhexidine Gluconate Cloth  6 each Topical Daily  . feeding supplement  1 Container Oral TID BM  . fluticasone furoate-vilanterol  1 puff Inhalation Daily  . loratadine  10 mg Oral Daily  . melatonin  3 mg Oral QHS  . rosuvastatin  20 mg Oral Daily  . umeclidinium bromide  1 puff Inhalation Daily   Continuous Infusions: . sodium chloride Stopped (11/05/20 0847)  . piperacillin-tazobactam (ZOSYN)  IV 3.375 g (11/08/20 0954)   PRN Meds: sodium chloride, acetaminophen, docusate sodium, fentaNYL (SUBLIMAZE) injection, polyethylene glycol   Vital Signs    Vitals:   11/08/20 0400 11/08/20 0748 11/08/20 0817 11/08/20 1007  BP:  136/64    Pulse:   (!) 101   Resp:  20 (!) 32 20  Temp: 98.2 F (36.8 C)     TempSrc: Oral     SpO2:  93% 95%   Weight:      Height:        Intake/Output Summary (Last 24 hours) at 11/08/2020 1145 Last data filed at 11/08/2020 0856 Gross per 24 hour  Intake 690 ml  Output --  Net 690 ml   Last 3 Weights 11/07/2020 11/05/2020 11/05/2020  Weight (lbs) 172 lb 6.4 oz 172 lb 6.4 oz 168 lb 1.6 oz  Weight (kg) 78.2 kg 78.2 kg 76.25 kg      Telemetry    Atrial fibrillation with HR in the 100-110's - Personally Reviewed  ECG    N/A  Physical Exam   GEN: ill appearing HEENT: Normal NECK: No JVD; No carotid bruits LYMPHATICS: No lymphadenopathy CARDIAC:irregularly irregular and tachy, no murmurs, rubs, gallops RESPIRATORY: decreased BS ABDOMEN: Soft, non-tender, non-distended MUSCULOSKELETAL:  No edema; No deformity  SKIN: Warm and dry NEUROLOGIC:  Alert and oriented x 3 PSYCHIATRIC:  Normal affect    Labs    High  Sensitivity Troponin:   Recent Labs  Lab 11/05/20 0650 11/05/20 1242 11/05/20 1508  TROPONINIHS 1,716* 1,693* 1,404*      Chemistry Recent Labs  Lab 11/05/20 0649 11/05/20 0730 11/05/20 1148 11/06/20 0137 11/07/20 0117 11/08/20 0159  NA 129* 131* 127* 132*  --   --   K 3.7 3.3* 3.1* 4.3  --   --   CL 99  --  101 102  --   --   CO2 11*  --  18* 24  --   --   GLUCOSE 278*  --  176* 121*  --   --   BUN 16  --  15 12  --   --   CREATININE 1.33*  --  1.20* 0.93  --   --   CALCIUM 8.2*  --  7.8* 7.7*  --   --   PROT 5.8*  --  4.6* 4.9* 5.0* 5.3*  ALBUMIN 2.5*  --  2.0* 2.0* 2.0* 2.0*  AST 77*  --  83* 58* 43* 23  ALT 41  --  52* 45* 50* 35  ALKPHOS 162*  --  139* 130* 110 100  BILITOT 1.9*  --  2.8* 2.3* 1.7* 1.5*  GFRNONAA 40*  --  45* >  60  --   --   ANIONGAP 19*  --  8 6  --   --      Hematology Recent Labs  Lab 11/06/20 0137 11/07/20 0117 11/08/20 0159  WBC 19.1* 14.5* 13.3*  RBC 3.56* 3.71* 3.90  HGB 10.8* 11.3* 11.5*  HCT 30.6* 32.1* 34.2*  MCV 86.0 86.5 87.7  MCH 30.3 30.5 29.5  MCHC 35.3 35.2 33.6  RDW 14.5 14.9 14.9  PLT 138* 160 170    BNP Recent Labs  Lab 11/05/20 0650  BNP 1,682.9*     DDimer No results for input(s): DDIMER in the last 168 hours.   Radiology    No results found.  Cardiac Studies   Echo 11/05/2020 1. S/p TAVR (23 mm evolout pro). MG slightly higher across prosthesis  likely due to Afib with RVR and sepsis. MR is now moderate to severe. TR  is now moderate to severe. Would have low threshold for TEE if the patient  develops bacteremia with prosthetic  AVR and change in MR/TR. No definitive vegetation seen on this TTE.  2. 23 mm evolute pro (ViV procedure for prior 21 mm Magna ease) in the  aortic position. Vmax 2.8 m/s, MG 21 mmHG, EOA 1.18 cm2, DI 0.28. MG  slightly higher than what was reported at [facility] (15 mmHG 05/30/2020),  likely due to Afib and sepsis. The AT is  slightly prolonged ~114 msec. If bacteremia is  present would recommend  TEE. The aortic valve has been repaired/replaced. Aortic valve  regurgitation is not visualized. There is a 23 mm CoreValve-Evolut Pro  prosthetic (TAVR) valve present in the aortic  position. Procedure Date: 05/01/2020.  3. Left ventricular ejection fraction, by estimation, is 50 to 55%. The  left ventricle has low normal function. The left ventricle has no regional  wall motion abnormalities. There is mild concentric left ventricular  hypertrophy. Left ventricular  diastolic function could not be evaluated.  4. Right ventricular systolic function is moderately reduced. The right  ventricular size is mildly enlarged. There is moderately elevated  pulmonary artery systolic pressure. The estimated right ventricular  systolic pressure is 01.7 mmHg.  5. Right atrial size was mildly dilated.  6. The mitral valve is degenerative. Moderate to severe mitral valve  regurgitation. No evidence of mitral stenosis.  7. The tricuspid valve is abnormal. Tricuspid valve regurgitation is  moderate to severe.  8. The inferior vena cava is dilated in size with >50% respiratory  variability, suggesting right atrial pressure of 8 mmHg.   Patient Profile     86 y.o. 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   Assessment & Plan    1. New onset afib RVR  - Started on IV Cardizem with improved HR then discontinued due to hypotension - HR stable in 90-110s without rate control agent >> will improved with resolving sepsis  - LVEF low normal to 50-55% - continue Apixaban 5mg  BID (CHADS2VASC score 5)  2. Elevated troponin  - not ACS pattern >>flat trend ([id number]) - she has never had CP and no focal WMA on echo - Likely demand - On heparin for  anticoagulation   3. Valvular heart dz - Echo with LVEF of 50-55% - S/p TAVR (23 mm evolout pro).  MG 21 mmHG, EOA 1.18 cm2, DI 0.28. MG  slightly higher than what was reported at [facility] (15 mmHG 05/30/2020),  likely due to Afib and sepsis. - MR is now moderate to severe. TR  is now moderate to severe.  - The increased gradient across the valve is probably related to A. fib RVR and sepsis - No plan for TEE unless positive blood culture   For questions or updates, please contact [facility] Please consult [email or web address] for contact info under        Signed, Fransico Him, MD  11/08/2020, 11:45 AM

## 2020-11-09 ENCOUNTER — Inpatient Hospital Stay (HOSPITAL_COMMUNITY): Payer: Medicare Other

## 2020-11-09 DIAGNOSIS — Z952 Presence of prosthetic heart valve: Secondary | ICD-10-CM

## 2020-11-09 DIAGNOSIS — R778 Other specified abnormalities of plasma proteins: Secondary | ICD-10-CM | POA: Diagnosis not present

## 2020-11-09 DIAGNOSIS — A427 Actinomycotic sepsis: Secondary | ICD-10-CM

## 2020-11-09 DIAGNOSIS — K2980 Duodenitis without bleeding: Secondary | ICD-10-CM | POA: Diagnosis not present

## 2020-11-09 DIAGNOSIS — I340 Nonrheumatic mitral (valve) insufficiency: Secondary | ICD-10-CM | POA: Diagnosis not present

## 2020-11-09 DIAGNOSIS — R6521 Severe sepsis with septic shock: Secondary | ICD-10-CM | POA: Diagnosis not present

## 2020-11-09 DIAGNOSIS — R6520 Severe sepsis without septic shock: Secondary | ICD-10-CM | POA: Diagnosis not present

## 2020-11-09 DIAGNOSIS — R7881 Bacteremia: Secondary | ICD-10-CM

## 2020-11-09 DIAGNOSIS — I4891 Unspecified atrial fibrillation: Secondary | ICD-10-CM | POA: Diagnosis not present

## 2020-11-09 DIAGNOSIS — N179 Acute kidney failure, unspecified: Secondary | ICD-10-CM | POA: Diagnosis not present

## 2020-11-09 DIAGNOSIS — A419 Sepsis, unspecified organism: Secondary | ICD-10-CM | POA: Diagnosis not present

## 2020-11-09 LAB — RENAL FUNCTION PANEL
Albumin: 1.9 g/dL — ABNORMAL LOW (ref 3.5–5.0)
Anion gap: 8 (ref 5–15)
BUN: 9 mg/dL (ref 8–23)
CO2: 22 mmol/L (ref 22–32)
Calcium: 7.8 mg/dL — ABNORMAL LOW (ref 8.9–10.3)
Chloride: 100 mmol/L (ref 98–111)
Creatinine, Ser: 0.77 mg/dL (ref 0.44–1.00)
GFR, Estimated: >60 mL/min (ref >60)
Glucose, Bld: 122 mg/dL — ABNORMAL HIGH (ref 70–99)
Phosphorus: 3.3 mg/dL (ref 2.5–4.6)
Potassium: 3.7 mmol/L (ref 3.5–5.1)
Sodium: 130 mmol/L — ABNORMAL LOW (ref 135–145)

## 2020-11-09 LAB — CULTURE, BLOOD (ROUTINE X 2): Special Requests: ADEQUATE

## 2020-11-09 LAB — CBC
HCT: 33 % — ABNORMAL LOW (ref 36.0–46.0)
Hemoglobin: 11.2 g/dL — ABNORMAL LOW (ref 12.0–15.0)
MCH: 30.1 pg (ref 26.0–34.0)
MCHC: 33.9 g/dL (ref 30.0–36.0)
MCV: 88.7 fL (ref 80.0–100.0)
Platelets: 170 10*3/uL (ref 150–400)
RBC: 3.72 MIL/uL — ABNORMAL LOW (ref 3.87–5.11)
RDW: 14.9 % (ref 11.5–15.5)
WBC: 11.8 10*3/uL — ABNORMAL HIGH (ref 4.0–10.5)
nRBC: 0 % (ref 0.0–0.2)

## 2020-11-09 LAB — HEPATIC FUNCTION PANEL
ALT: 27 U/L (ref 0–44)
AST: 21 U/L (ref 15–41)
Albumin: 1.9 g/dL — ABNORMAL LOW (ref 3.5–5.0)
Alkaline Phosphatase: 93 U/L (ref 38–126)
Bilirubin, Direct: 0.4 mg/dL — ABNORMAL HIGH (ref 0.0–0.2)
Indirect Bilirubin: 0.9 mg/dL (ref 0.3–0.9)
Total Bilirubin: 1.3 mg/dL — ABNORMAL HIGH (ref 0.3–1.2)
Total Protein: 5.5 g/dL — ABNORMAL LOW (ref 6.5–8.1)

## 2020-11-09 LAB — MAGNESIUM: Magnesium: 1.9 mg/dL (ref 1.7–2.4)

## 2020-11-09 MED ORDER — SODIUM CHLORIDE 0.9 % IV SOLN
3.0000 g | Freq: Four times a day (QID) | INTRAVENOUS | Status: DC
  Administered 2020-11-09 – 2020-11-11 (×8): 3 g via INTRAVENOUS
  Filled 2020-11-09: qty 8
  Filled 2020-11-09: qty 3
  Filled 2020-11-09 (×2): qty 8
  Filled 2020-11-09: qty 3
  Filled 2020-11-09: qty 8
  Filled 2020-11-09 (×3): qty 3
  Filled 2020-11-09: qty 8

## 2020-11-09 MED ORDER — IOHEXOL 9 MG/ML PO SOLN
ORAL | Status: AC
  Filled 2020-11-09: qty 1000

## 2020-11-09 MED ORDER — IOHEXOL 300 MG/ML  SOLN
99.0000 mL | Freq: Once | INTRAMUSCULAR | Status: AC | PRN
  Administered 2020-11-09: 99 mL via INTRAVENOUS

## 2020-11-09 MED ORDER — IOHEXOL 9 MG/ML PO SOLN
500.0000 mL | ORAL | Status: AC
  Administered 2020-11-09 (×2): 500 mL via ORAL

## 2020-11-09 NOTE — Progress Notes (Signed)
[facility]                                                                              Patient Demographics  Tracy Buck, is a 86 y.o. female, DOB - 03/07/1935, TKP:[id number]  Admit date - 11/05/2020   Admitting Physician Candee Furbish, MD  Outpatient Primary MD for the patient is Drake Leach, MD  Outpatient specialists:   LOS - 4  days   Medical records reviewed and are as summarized below:    Chief Complaint  Patient presents with  . Shortness of Breath       Brief summary   Patient is a 86 year old female with history of COPD, hypertension, bioprosthetic aortic valve, 2021, CAD and lung cancer (carcinoid) s/p lobectomy. She was in her usual state of health until approximately 3/6 when she developed progressive fatigue. She saw PCP and cardiologist with these complaints without identifiable etiology. It was felt that perhaps polypharmacy was playing a role. Her PRN alprazolam was discontinued and Ambien dose was reduced from 10mg  to 5mg  QHS. On 3/16, early a.m., developed shortness of breath and chest tightness, presented to [place].  EKG was felt nonischemic, had atrial fibrillation with RVR.  Patient was started on diltiazem bolus and infusion.  Patient also had low-grade fever 100 F, leukocytosis, placed on empiric antibiotic and sepsis work-up was initiated.  Chest x-ray showed left-sided effusion at her former lobectomy site, creatinine 1.3, lactic acid 8.7, troponin 1700, BNP 1682. Patient then became hypotensive and was admitted to ICU.  Severe sepsis was presumed due to duodenitis and antibiotics were narrowed, now on IV Zosyn, for 7 to 10 days.  Cardiology was also consulted for atrial fibrillation with RVR and abnormal echo changes (worsened MR, TAVR position slightly off). Patient was transferred to [facility] service on 11/07/2020.  11/08/2020: Patient was seen alongside patient's daughter.  According to the patient's daughter, patient  looks a lot better.  Patient only complains of mild epigastric/mid abdominal discomfort/pain.  Patient has history of duodenitis.  Blood culture shows growing GPI.  Will await final culture results.  Possibly, these may just be contaminant.  Patient is on IV Zosyn.  Patient has history of bioprosthetic aortic valve.  11/09/2020: Blood culture grew actinomyces neuii.  Discussed with infectious disease team, CT abdomen, pelvis and chest with contrast advised.  Will repeat blood cultures.  Patient will likely need TEE, will defer the decision to proceed with TEE to the infectious disease team and cardiology team.  Patient continues to improve.   Assessment & Plan   Principal problem Severe sepsis presumed due to duodenitis, present on admission -Urine culture negative.  Blood culture positive for gram-positive rods, sensitivities pending -CT renal stone study showed inflammatory changes surrounding the duodenum and adjacent to the head of the pancreas with extension into the region of porta hepatis, concerning for probable duodenitis. -Leukocytosis improving, afebrile -IV antibiotics narrowed to Zosyn (previously on IV vancomycin, cefepime and Flagyl), continue for total of 7 to 10 days.  Can narrow to Augmentin closer to discharge. -Advance diet to solids 11/08/2020: See above.  Patient is on IV Zosyn.  Follow final culture results. 11/09/2020: Blood culture grew actinomyces species.  Infectious disease team consulted.  For CT chest, abdomen and pelvis with contrast.  Active Problems: Atrial fibrillation with RVR, new onset -Patient was placed on IV Cardizem drip, heart rate improved however patient developed hypotension -Cardiology consulted, heart rate now improving without rate control agent -Patient was placed on IV heparin, now transitioned to apixaban -Elevated troponin likely due to demand ischemia, atrial fibrillation with RVR, not ACS -2D echo with LVEF of 50 to 55% 11/09/2020: Heart rate  is controlled.  Patient is currently on Eliquis.  Abnormal EKG changes, worsened MR, TR, TAVR position change -Cardiology following, MR is now moderate to severe, TR is now moderate to severe -No plan for TEE unless positive blood cultures.  Blood cultures growing gram-positive rods and may be contaminants.  Will await final cultures. 11/09/2020: Final culture grew actinomyces species.  Patient will likely need TEE.  Will await infectious disease input.  Acute kidney injury with metabolic acidosis, lactic acidosis -Baseline creatinine 0.9-1.05 -Presented with creatinine of 1.3, lactic acid 8.7 on admission -Patient was placed on bicarb drip, lactic acid improved to 2.3, bicarb normal 24, creatinine 0.9 -Diet advanced to solids today 11/08/2020: Acute kidney injury has resolved.  Metabolic acidosis has resolved.  Last CO2 was 24.  COPD -Currently stable, no wheezing, continue outpatient inhalers  Hypertension -BP currently stable, holding antihypertensives  Hyperlipidemia Continue statin  Progressive fatigue, dizziness, generalized debility -Patient was seen by her PCP, cardiologist, no identifiable etiology and.  Perhaps polypharmacy playing a role.  Xanax was discontinued.  Ambien dose decreased from 10 mg to 5 mg, now discontinued -Continue melatonin -Would not resume Xanax outpatient, discussed with patient's daughter -Continue PT OT, mobilization, very deconditioned  Malnutrition of moderate degree -Very deconditioned, diet advanced today, albumin 2.0 Estimated body mass index is 30.54 kg/m as calculated from the following:   Height as of this encounter: 5\' 3"  (1.6 m).   Weight as of this encounter: 78.2 kg.  Code Status: Full CODE STATUS DVT Prophylaxis:  SCDs Start: 11/05/20 1021 apixaban (ELIQUIS) tablet 5 mg   Level of Care: Level of care: Progressive, transfer to the floor today Family Communication: Daughter was present during consultation.   Disposition Plan:      Status is: Inpatient  Remains inpatient appropriate because:Inpatient level of care appropriate due to severity of illness   Dispo: The patient is from: Home              Anticipated d/c is to: Home              Patient currently is not medically stable to d/c.  Diet advanced today, very deconditioned.   Difficult to place patient No      Time Spent in minutes 35 minutes  Procedures:  2D echo  Consultants:   Patient was admitted by critical care Cardiology  Antimicrobials:   Anti-infectives (From admission, onward)   Start     Dose/Rate Route Frequency Ordered Stop   11/09/20 1130  Ampicillin-Sulbactam (UNASYN) 3 g in sodium chloride 0.9 % 100 mL IVPB        3 g 200 mL/hr over 30 Minutes Intravenous Every 6 hours 11/09/20 1025     11/06/20 0600  vancomycin (VANCOREADY) IVPB 750 mg/150 mL  Status:  Discontinued        750 mg 150 mL/hr over 60 Minutes Intravenous Every 24 hours 11/05/20 0757 11/06/20 0745   11/05/20 2000  ceFEPIme (MAXIPIME) 2  g in sodium chloride 0.9 % 100 mL IVPB  Status:  Discontinued        2 g 200 mL/hr over 30 Minutes Intravenous Every 12 hours 11/05/20 0757 11/05/20 1704   11/05/20 1745  piperacillin-tazobactam (ZOSYN) IVPB 3.375 g  Status:  Discontinued        3.375 g 12.5 mL/hr over 240 Minutes Intravenous Every 8 hours 11/05/20 1720 11/09/20 1015   11/05/20 0730  ceFEPIme (MAXIPIME) 2 g in sodium chloride 0.9 % 100 mL IVPB        2 g 200 mL/hr over 30 Minutes Intravenous  Once 11/05/20 0716 11/05/20 0814   11/05/20 0730  metroNIDAZOLE (FLAGYL) IVPB 500 mg        500 mg 100 mL/hr over 60 Minutes Intravenous  Once 11/05/20 0716 11/05/20 0850   11/05/20 0730  vancomycin (VANCOCIN) IVPB 1000 mg/200 mL premix        1,000 mg 200 mL/hr over 60 Minutes Intravenous  Once 11/05/20 0716 11/05/20 0914         Medications  Scheduled Meds: . apixaban  5 mg Oral BID  . aspirin EC  81 mg Oral Daily  . Chlorhexidine Gluconate Cloth  6 each Topical  Daily  . feeding supplement  1 Container Oral TID BM  . fluticasone furoate-vilanterol  1 puff Inhalation Daily  . iohexol  500 mL Oral Q1H  . iohexol      . loratadine  10 mg Oral Daily  . melatonin  3 mg Oral QHS  . rosuvastatin  20 mg Oral Daily  . umeclidinium bromide  1 puff Inhalation Daily   Continuous Infusions: . sodium chloride Stopped (11/05/20 0847)  . ampicillin-sulbactam (UNASYN) IV 3 g (11/09/20 1146)   PRN Meds:.sodium chloride, acetaminophen, docusate sodium, fentaNYL (SUBLIMAZE) injection, polyethylene glycol      Subjective:   -Seen alongside patient's daughter. -No new complaints today.    Objective:   Vitals:   11/09/20 0800 11/09/20 0947 11/09/20 1211 11/09/20 1400  BP:   123/87 120/77  Pulse: 62   75  Resp:    (!) 22  Temp: 98.4 F (36.9 C)   98 F (36.7 C)  TempSrc:      SpO2:  96%    Weight:      Height:        Intake/Output Summary (Last 24 hours) at 11/09/2020 1519 Last data filed at 11/09/2020 1200 Gross per 24 hour  Intake 786.82 ml  Output --  Net 786.82 ml     Wt Readings from Last 3 Encounters:  11/07/20 78.2 kg  08/07/17 78.9 kg  12/04/16 77.1 kg     Exam  General: Patient is not in any distress.  Patient is awake and alert.  Patient continues to look weak and deconditioned.    Cardiovascular: S1 S2   Respiratory: Decreased air entry.  Gastrointestinal: Obese.  Soft and no objective tenderness elicited.  Ext: Bilateral leg edema. Neuro: Patient is awake and alert.  Patient moves all extremities.     Data Reviewed:  I have personally reviewed following labs and imaging studies  Micro Results Recent Results (from the past 240 hour(s))  Resp Panel by RT-PCR (Flu A&B, Covid) Nasopharyngeal Swab     Status: None   Collection Time: 11/05/20  7:19 AM   Specimen: Nasopharyngeal Swab; Nasopharyngeal(NP) swabs in vial transport medium  Result Value Ref Range Status   SARS Coronavirus 2 by RT PCR NEGATIVE NEGATIVE  Final  Comment: (NOTE) SARS-CoV-2 target nucleic acids are NOT DETECTED.  The SARS-CoV-2 RNA is generally detectable in upper respiratory specimens during the acute phase of infection. The lowest concentration of SARS-CoV-2 viral copies this assay can detect is 138 copies/mL. A negative result does not preclude SARS-Cov-2 infection and should not be used as the sole basis for treatment or other patient management decisions. A negative result may occur with  improper specimen collection/handling, submission of specimen other than nasopharyngeal swab, presence of viral mutation(s) within the areas targeted by this assay, and inadequate number of viral copies(<138 copies/mL). A negative result must be combined with clinical observations, patient history, and epidemiological information. The expected result is Negative.  Fact Sheet for Patients:  EntrepreneurPulse.com.au  Fact Sheet for Healthcare Providers:  IncredibleEmployment.be  This test is no t yet approved or cleared by the [place] FDA and  has been authorized for detection and/or diagnosis of SARS-CoV-2 by FDA under an Emergency Use Authorization (EUA). This EUA will remain  in effect (meaning this test can be used) for the duration of the COVID-19 declaration under Section 564(b)(1) of the Act, 21 U.S.C.section 360bbb-3(b)(1), unless the authorization is terminated  or revoked sooner.       Influenza A by PCR NEGATIVE NEGATIVE Final   Influenza B by PCR NEGATIVE NEGATIVE Final    Comment: (NOTE) The Xpert Xpress SARS-CoV-2/FLU/RSV plus assay is intended as an aid in the diagnosis of influenza from Nasopharyngeal swab specimens and should not be used as a sole basis for treatment. Nasal washings and aspirates are unacceptable for Xpert Xpress SARS-CoV-2/FLU/RSV testing.  Fact Sheet for Patients: EntrepreneurPulse.com.au  Fact Sheet for Healthcare  Providers: IncredibleEmployment.be  This test is not yet approved or cleared by the [place] FDA and has been authorized for detection and/or diagnosis of SARS-CoV-2 by FDA under an Emergency Use Authorization (EUA). This EUA will remain in effect (meaning this test can be used) for the duration of the COVID-19 declaration under Section 564(b)(1) of the Act, 21 U.S.C. section 360bbb-3(b)(1), unless the authorization is terminated or revoked.  Performed at [facility], [address] [place], [place] [address]   Urine culture     Status: None   Collection Time: 11/05/20  7:26 AM   Specimen: In/Out Cath Urine  Result Value Ref Range Status   Specimen Description   Final    IN/OUT CATH URINE Performed at [facility], [address] [place], [place] [address]    Special Requests   Final    NONE Performed at [facility], [address] [place], [place] [address]    Culture   Final    NO GROWTH Performed at [facility], [place] [address] [place], [place] [address]    Report Status 11/06/2020 FINAL  Final  Blood Culture (routine x 2)     Status: Abnormal   Collection Time: 11/05/20  7:27 AM   Specimen: BLOOD RIGHT HAND  Result Value Ref Range Status   Specimen Description   Final    BLOOD RIGHT HAND Performed at [facility], [address] [place], [place] [address]    Special Requests   Final    BOTTLES DRAWN AEROBIC AND ANAEROBIC Blood Culture adequate volume Performed at [facility], [address] [place], [place] [address]    Culture  Setup Time   Final    GRAM POSITIVE RODS IN BOTH AEROBIC  AND ANAEROBIC BOTTLES CRITICAL RESULT CALLED TO, READ BACK BY AND VERIFIED WITH: PHARMD HALEY B. 1936 [id number] FCP    Culture (A)  Final    ACTINOMYCES NEUII Standardized susceptibility testing for this organism is not available. Performed at [facility], [place] [address],  [place], [place] [address]    Report Status 11/09/2020 FINAL  Final  MRSA PCR Screening     Status: None   Collection Time: 11/05/20 11:19 AM   Specimen: Nasal Mucosa; Nasopharyngeal  Result Value Ref Range Status   MRSA by PCR NEGATIVE NEGATIVE Final    Comment:        The GeneXpert MRSA Assay (FDA approved for NASAL specimens only), is one component of a comprehensive MRSA colonization surveillance program. It is not intended to diagnose MRSA infection nor to guide or monitor treatment for MRSA infections. Performed at [facility], [place] [address] [place], [place] [address]     Radiology Reports CT CHEST WO CONTRAST  Result Date: 11/05/2020 CLINICAL DATA:  86 year old female with history of respiratory failure. Low back pain. EXAM: CT CHEST, ABDOMEN AND PELVIS WITHOUT CONTRAST TECHNIQUE: Multidetector CT imaging of the chest, abdomen and pelvis was performed following the standard protocol without IV contrast. COMPARISON:  Chest CT 03/27/2020. CT the abdomen and pelvis 10/29/2013. FINDINGS: CT CHEST FINDINGS Cardiovascular: Heart size is normal. There is no significant pericardial fluid, thickening or pericardial calcification. There is aortic atherosclerosis, as well as atherosclerosis of the great vessels of the mediastinum and the coronary arteries, including calcified atherosclerotic plaque in the left main, left anterior descending, left circumflex and right coronary arteries. Status post median sternotomy for CABG. Status post TAVR. Mild calcifications of the mitral annulus. Mediastinum/Nodes: No pathologically enlarged mediastinal or hilar lymph nodes. Please note that accurate exclusion of hilar adenopathy is limited on noncontrast CT scans. Esophagus is unremarkable in appearance. No axillary lymphadenopathy. Lungs/Pleura: Status post left lower lobectomy with compensatory hyperexpansion of the left upper lobe. Several small pulmonary nodules are stable in number and size  compared to the prior examination, largest of which is a 7 mm left upper lobe pulmonary nodule (axial image 73 of series 4). No other new suspicious appearing pulmonary nodules or masses are noted. Small bilateral pleural effusions lying dependently with some areas of passive subsegmental atelectasis in the right lower lobe and dependent portion of the left upper lobe. Musculoskeletal: Median sternotomy wires. There are no aggressive appearing lytic or blastic lesions noted in the visualized portions of the skeleton. CT ABDOMEN PELVIS FINDINGS Hepatobiliary: No definite suspicious cystic or solid hepatic lesions are confidently identified on today's noncontrast CT examination. Status post cholecystectomy. Pancreas: No definite pancreatic mass or peripancreatic fluid collections. Peripancreatic inflammatory changes noted lateral to the head of the pancreas (in close association with the duodenum and porta hepatis). Spleen: Unremarkable. Adrenals/Urinary Tract: No calcifications are identified in the collecting system of either kidney, along the course of either ureter, or within the lumen of the urinary bladder. No hydroureteronephrosis. Multiple renal lesions bilaterally, incompletely characterized on today's non-contrast CT examination, but likely to represent cysts, largest of which is exophytic extending off the upper pole of the right kidney measuring 5.5 cm in diameter. Unenhanced appearance of the urinary bladder is normal. Bilateral adrenal glands are normal in appearance. Stomach/Bowel: Unenhanced appearance of the stomach is normal. Inflammatory changes noted surrounding the duodenum, extending into the adjacent porta hepatis. No pathologic dilatation of small bowel or colon. Numerous colonic diverticulae are noted,  without surrounding inflammatory changes to suggest a colonic diverticulitis at this time. Normal appendix. Vascular/Lymphatic: Aortic atherosclerosis. No lymphadenopathy noted in the abdomen or  pelvis. Reproductive: Uterus and ovaries are unremarkable in appearance. Other: Trace volume of ascites adjacent to the liver. No pneumoperitoneum. Musculoskeletal: There are no aggressive appearing lytic or blastic lesions noted in the visualized portions of the skeleton. IMPRESSION: 1. Inflammatory changes surrounding the duodenum, and adjacent to the head of the pancreas, with extension into the region of the porta hepatis. Findings are concerning for probable duodenitis. No inflammatory changes are noted adjacent to the body or tail of the pancreas, but correlation with lipase levels is also recommended. 2. Small bilateral pleural effusions lying dependently with some associated passive subsegmental atelectasis in the dependent portions of the lungs. 3. Trace volume of ascites most evident adjacent to the liver. 4. Colonic diverticulosis without evidence of acute diverticulitis at this time. 5. Aortic atherosclerosis, in addition to left main and 3 vessel coronary artery disease. Status post CABG and TAVR. 6. All previously noted small pulmonary nodules are stable compared to prior examinations dating back to at least 2019, considered benign. 7. Additional incidental findings, as above. Electronically Signed   By: Vinnie Langton M.D.   On: 11/05/2020 16:48   DG Chest Port 1 View  Result Date: 11/06/2020 CLINICAL DATA:  Hypoxia EXAM: PORTABLE CHEST 1 VIEW COMPARISON:  11/05/2020 FINDINGS: Prior CABG and TAVR. Mild cardiomegaly, vascular congestion. Interstitial prominence throughout the lungs, increasing since prior study, favor interstitial edema. Small left pleural effusion. No pneumothorax or acute bony abnormality. IMPRESSION: Increasing vascular congestion and interstitial prominence, likely interstitial edema. Small left pleural effusion. Electronically Signed   By: Rolm Baptise M.D.   On: 11/06/2020 08:07   DG Chest Port 1 View  Result Date: 11/05/2020 CLINICAL DATA:  Questionable sepsis EXAM:  PORTABLE CHEST 1 VIEW COMPARISON:  09/16/2016 FINDINGS: Normal heart size and mediastinal contours. Aortic valve replacement with interval revision. Indistinct density at the bases with small pleural effusion seen laterally on the left. No air bronchogram. No pneumothorax. IMPRESSION: 1. Small left pleural effusion. 2. Hazy density at the bases could be atelectasis or mild infiltrate. Electronically Signed   By: Monte Fantasia M.D.   On: 11/05/2020 07:51   ECHOCARDIOGRAM COMPLETE  Result Date: 11/05/2020    ECHOCARDIOGRAM REPORT   Patient Name:   Tracy Buck Date of Exam: 11/05/2020 Medical Rec #:  [id number]    Height:       63.0 in Accession #:    [id number]   Weight:       172.4 lb Date of Birth:  01/05/1935    BSA:          1.815 m Patient Age:    72 years     BP:           105/77 mmHg Patient Gender: F            HR:           93 bpm. Exam Location:  Inpatient Procedure: 2D Echo, Cardiac Doppler and Color Doppler Indications:    Abnormal EKG  History:        Patient has prior history of Echocardiogram examinations, most                 recent 09/17/2016. Previous Myocardial Infarction, Prior CABG;                 Arrythmias:Atrial Fibrillation. S/p AVR.  Aortic Valve: 23 mm CoreValve-Evolut Pro prosthetic, stented                 (TAVR) valve is present in the aortic position. Procedure Date:                 05/01/2020.  Sonographer:    Luisa Hart RDCS Referring Phys: [facility]  1. S/p TAVR (23 mm evolout pro). MG slightly higher across prosthesis likely due to Afib with RVR and sepsis. MR is now moderate to severe. TR is now moderate to severe. Would have low threshold for TEE if the patient develops bacteremia with prosthetic  AVR and change in MR/TR. No definitive vegetation seen on this TTE.  2. 23 mm evolute pro (ViV procedure for prior 21 mm Magna ease) in the aortic position. Vmax 2.8 m/s, MG 21 mmHG, EOA 1.18 cm2, DI 0.28. MG slightly higher than what was reported  at [facility] (15 mmHG 05/30/2020), likely due to Afib and sepsis. The AT is slightly prolonged ~114 msec. If bacteremia is present would recommend TEE. The aortic valve has been repaired/replaced. Aortic valve regurgitation is not visualized. There is a 23 mm CoreValve-Evolut Pro prosthetic (TAVR) valve present in the aortic position. Procedure Date: 05/01/2020.  3. Left ventricular ejection fraction, by estimation, is 50 to 55%. The left ventricle has low normal function. The left ventricle has no regional wall motion abnormalities. There is mild concentric left ventricular hypertrophy. Left ventricular diastolic function could not be evaluated.  4. Right ventricular systolic function is moderately reduced. The right ventricular size is mildly enlarged. There is moderately elevated pulmonary artery systolic pressure. The estimated right ventricular systolic pressure is 42.3 mmHg.  5. Right atrial size was mildly dilated.  6. The mitral valve is degenerative. Moderate to severe mitral valve regurgitation. No evidence of mitral stenosis.  7. The tricuspid valve is abnormal. Tricuspid valve regurgitation is moderate to severe.  8. The inferior vena cava is dilated in size with >50% respiratory variability, suggesting right atrial pressure of 8 mmHg. Comparison(s): Changes from prior study are noted. FINDINGS  Left Ventricle: Left ventricular ejection fraction, by estimation, is 50 to 55%. The left ventricle has low normal function. The left ventricle has no regional wall motion abnormalities. The left ventricular internal cavity size was normal in size. There is mild concentric left ventricular hypertrophy. Left ventricular diastolic function could not be evaluated due to atrial fibrillation. Left ventricular diastolic function could not be evaluated. Right Ventricle: The right ventricular size is mildly enlarged. No increase in right ventricular wall thickness. Right ventricular systolic function is moderately reduced.  There is moderately elevated pulmonary artery systolic pressure. The tricuspid regurgitant velocity is 3.27 m/s, and with an assumed right atrial pressure of 8 mmHg, the estimated right ventricular systolic pressure is 53.6 mmHg. Left Atrium: Left atrial size was normal in size. Right Atrium: Right atrial size was mildly dilated. Prominent Crista terminalis. Pericardium: Trivial pericardial effusion is present. Presence of pericardial fat pad. Mitral Valve: The mitral valve is degenerative in appearance. Moderate to severe mitral valve regurgitation. No evidence of mitral valve stenosis. MV peak gradient, 13.4 mmHg. The mean mitral valve gradient is 6.0 mmHg. Tricuspid Valve: The tricuspid valve is abnormal. Tricuspid valve regurgitation is moderate to severe. No evidence of tricuspid stenosis. Aortic Valve: 23 mm evolute pro (ViV procedure for prior 21 mm Magna ease) in the aortic position. Vmax 2.8 m/s, MG 21 mmHG, EOA 1.18 cm2, DI 0.28. MG  slightly higher than what was reported at [facility] Hospital (15 mmHG 05/30/2020), likely due to Afib and sepsis. The AT is slightly prolonged ~114 msec. If bacteremia is present would recommend TEE. The aortic valve has been repaired/replaced. Aortic valve regurgitation is not visualized. Aortic valve mean gradient measures 21.1 mmHg. Aortic valve peak gradient measures 32.3 mmHg. Aortic valve area, by VTI measures 1.18 cm. There is a 23 mm CoreValve-Evolut Pro prosthetic, stented (TAVR) valve present in the aortic position. Procedure Date: 05/01/2020. Pulmonic Valve: The pulmonic valve was grossly normal. Pulmonic valve regurgitation is not visualized. No evidence of pulmonic stenosis. Aorta: The aortic root and ascending aorta are structurally normal, with no evidence of dilitation. Venous: The inferior vena cava is dilated in size with greater than 50% respiratory variability, suggesting right atrial pressure of 8 mmHg. IAS/Shunts: The atrial septum is grossly normal.  LEFT VENTRICLE  PLAX 2D LVIDd:         3.90 cm LVIDs:         2.90 cm LV PW:         1.30 cm LV IVS:        1.38 cm LVOT diam:     2.30 cm LV SV:         61 LV SV Index:   34 LVOT Area:     4.15 cm  LV Volumes (MOD) LV vol d, MOD A2C: 39.2 ml LV vol d, MOD A4C: 72.5 ml LV vol s, MOD A2C: 20.1 ml LV vol s, MOD A4C: 31.4 ml LV SV MOD A2C:     19.1 ml LV SV MOD A4C:     72.5 ml LV SV MOD BP:      27.4 ml RIGHT VENTRICLE RV S prime:     7.51 cm/s TAPSE (M-mode): 1.1 cm LEFT ATRIUM             Index       RIGHT ATRIUM           Index LA Vol (A2C):   81.2 ml 44.73 ml/m RA Area:     19.80 cm LA Vol (A4C):   58.1 ml 32.00 ml/m RA Volume:   59.40 ml  32.72 ml/m LA Biplane Vol: 68.5 ml 37.73 ml/m  AORTIC VALVE                    PULMONIC VALVE AV Area (Vmax):    1.22 cm     PV Vmax:       0.80 m/s AV Area (Vmean):   1.20 cm     PV Vmean:      59.400 cm/s AV Area (VTI):     1.18 cm     PV VTI:        0.134 m AV Vmax:           284.16 cm/s  PV Peak grad:  2.6 mmHg AV Vmean:          201.916 cm/s PV Mean grad:  2.0 mmHg AV VTI:            0.517 m AV Peak Grad:      32.3 mmHg AV Mean Grad:      21.1 mmHg LVOT Vmax:         83.31 cm/s LVOT Vmean:        58.377 cm/s LVOT VTI:          0.147 m LVOT/AV VTI ratio: 0.28  AORTA Ao Root diam: 3.50 cm Ao Asc diam:  3.15 cm MITRAL VALVE                 TRICUSPID VALVE MV Peak grad: 13.4 mmHg      TR Peak grad:   42.8 mmHg MV Mean grad: 6.0 mmHg       TR Vmax:        327.00 cm/s MV Vmax:      1.83 m/s MV Vmean:     108.0 cm/s     SHUNTS MR Peak grad:    125.4 mmHg  Systemic VTI:  0.15 m MR Mean grad:    78.0 mmHg   Systemic Diam: 2.30 cm MR Vmax:         560.00 cm/s MR Vmean:        417.0 cm/s MR PISA:         2.26 cm MR PISA Eff ROA: 9 mm MR PISA Radius:  0.60 cm Eleonore Chiquito MD Electronically signed by Eleonore Chiquito MD Signature Date/Time: 11/05/2020/5:22:55 PM    Final    CT RENAL STONE STUDY  Result Date: 11/05/2020 CLINICAL DATA:  86 year old female with history of respiratory failure.  Low back pain. EXAM: CT CHEST, ABDOMEN AND PELVIS WITHOUT CONTRAST TECHNIQUE: Multidetector CT imaging of the chest, abdomen and pelvis was performed following the standard protocol without IV contrast. COMPARISON:  Chest CT 03/27/2020. CT the abdomen and pelvis 10/29/2013. FINDINGS: CT CHEST FINDINGS Cardiovascular: Heart size is normal. There is no significant pericardial fluid, thickening or pericardial calcification. There is aortic atherosclerosis, as well as atherosclerosis of the great vessels of the mediastinum and the coronary arteries, including calcified atherosclerotic plaque in the left main, left anterior descending, left circumflex and right coronary arteries. Status post median sternotomy for CABG. Status post TAVR. Mild calcifications of the mitral annulus. Mediastinum/Nodes: No pathologically enlarged mediastinal or hilar lymph nodes. Please note that accurate exclusion of hilar adenopathy is limited on noncontrast CT scans. Esophagus is unremarkable in appearance. No axillary lymphadenopathy. Lungs/Pleura: Status post left lower lobectomy with compensatory hyperexpansion of the left upper lobe. Several small pulmonary nodules are stable in number and size compared to the prior examination, largest of which is a 7 mm left upper lobe pulmonary nodule (axial image 73 of series 4). No other new suspicious appearing pulmonary nodules or masses are noted. Small bilateral pleural effusions lying dependently with some areas of passive subsegmental atelectasis in the right lower lobe and dependent portion of the left upper lobe. Musculoskeletal: Median sternotomy wires. There are no aggressive appearing lytic or blastic lesions noted in the visualized portions of the skeleton. CT ABDOMEN PELVIS FINDINGS Hepatobiliary: No definite suspicious cystic or solid hepatic lesions are confidently identified on today's noncontrast CT examination. Status post cholecystectomy. Pancreas: No definite pancreatic mass or  peripancreatic fluid collections. Peripancreatic inflammatory changes noted lateral to the head of the pancreas (in close association with the duodenum and porta hepatis). Spleen: Unremarkable. Adrenals/Urinary Tract: No calcifications are identified in the collecting system of either kidney, along the course of either ureter, or within the lumen of the urinary bladder. No hydroureteronephrosis. Multiple renal lesions bilaterally, incompletely characterized on today's non-contrast CT examination, but likely to represent cysts, largest of which is exophytic extending off the upper pole of the right kidney measuring 5.5 cm in diameter. Unenhanced appearance of the urinary bladder is normal. Bilateral adrenal glands are normal in appearance. Stomach/Bowel: Unenhanced appearance of the stomach is normal. Inflammatory changes noted surrounding the duodenum, extending into the adjacent porta hepatis. No  pathologic dilatation of small bowel or colon. Numerous colonic diverticulae are noted, without surrounding inflammatory changes to suggest a colonic diverticulitis at this time. Normal appendix. Vascular/Lymphatic: Aortic atherosclerosis. No lymphadenopathy noted in the abdomen or pelvis. Reproductive: Uterus and ovaries are unremarkable in appearance. Other: Trace volume of ascites adjacent to the liver. No pneumoperitoneum. Musculoskeletal: There are no aggressive appearing lytic or blastic lesions noted in the visualized portions of the skeleton. IMPRESSION: 1. Inflammatory changes surrounding the duodenum, and adjacent to the head of the pancreas, with extension into the region of the porta hepatis. Findings are concerning for probable duodenitis. No inflammatory changes are noted adjacent to the body or tail of the pancreas, but correlation with lipase levels is also recommended. 2. Small bilateral pleural effusions lying dependently with some associated passive subsegmental atelectasis in the dependent portions of  the lungs. 3. Trace volume of ascites most evident adjacent to the liver. 4. Colonic diverticulosis without evidence of acute diverticulitis at this time. 5. Aortic atherosclerosis, in addition to left main and 3 vessel coronary artery disease. Status post CABG and TAVR. 6. All previously noted small pulmonary nodules are stable compared to prior examinations dating back to at least 2019, considered benign. 7. Additional incidental findings, as above. Electronically Signed   By: Vinnie Langton M.D.   On: 11/05/2020 16:48    Lab Data:  CBC: Recent Labs  Lab 11/05/20 0649 11/05/20 0730 11/05/20 1148 11/06/20 0137 11/07/20 0117 11/08/20 0159 11/09/20 0114  WBC 20.5*  --  26.2* 19.1* 14.5* 13.3* 11.8*  NEUTROABS 17.5*  --  25.4*  --   --   --   --   HGB 12.7   < > 10.7* 10.8* 11.3* 11.5* 11.2*  HCT 38.6   < > 30.9* 30.6* 32.1* 34.2* 33.0*  MCV 90.8  --  87.0 86.0 86.5 87.7 88.7  PLT 161  --  108* 138* 160 170 170   < > = values in this interval not displayed.   Basic Metabolic Panel: Recent Labs  Lab 11/05/20 0649 11/05/20 0730 11/05/20 1148 11/06/20 0137 11/09/20 0114  NA 129* 131* 127* 132* 130*  K 3.7 3.3* 3.1* 4.3 3.7  CL 99  --  101 102 100  CO2 11*  --  18* 24 22  GLUCOSE 278*  --  176* 121* 122*  BUN 16  --  15 12 9   CREATININE 1.33*  --  1.20* 0.93 0.77  CALCIUM 8.2*  --  7.8* 7.7* 7.8*  MG  --   --  1.7 2.2 1.9  PHOS  --   --  2.5 3.5 3.3   GFR: Estimated Creatinine Clearance: 52.7 mL/min (by C-G formula based on SCr of 0.77 mg/dL). Liver Function Tests: Recent Labs  Lab 11/05/20 1148 11/06/20 0137 11/07/20 0117 11/08/20 0159 11/09/20 0114  AST 83* 58* 43* 23 21  ALT 52* 45* 50* 35 27  ALKPHOS 139* 130* 110 100 93  BILITOT 2.8* 2.3* 1.7* 1.5* 1.3*  PROT 4.6* 4.9* 5.0* 5.3* 5.5*  ALBUMIN 2.0* 2.0* 2.0* 2.0* 1.9*  1.9*   Recent Labs  Lab 11/05/20 1823  LIPASE 25   No results for input(s): AMMONIA in the last 168 hours. Coagulation  Profile: Recent Labs  Lab 11/05/20 0726  INR 1.3*   Cardiac Enzymes: No results for input(s): CKTOTAL, CKMB, CKMBINDEX, TROPONINI in the last 168 hours. BNP (last 3 results) No results for input(s): PROBNP in the last 8760 hours. HbA1C: No results for input(s): HGBA1C  in the last 72 hours. CBG: Recent Labs  Lab 11/05/20 1155 11/05/20 1600 11/05/20 1936 11/06/20 0333  GLUCAP 179* 164* 133* 107*   Lipid Profile: No results for input(s): CHOL, HDL, LDLCALC, TRIG, CHOLHDL, LDLDIRECT in the last 72 hours. Thyroid Function Tests: No results for input(s): TSH, T4TOTAL, FREET4, T3FREE, THYROIDAB in the last 72 hours. Anemia Panel: No results for input(s): VITAMINB12, FOLATE, FERRITIN, TIBC, IRON, RETICCTPCT in the last 72 hours. Urine analysis:    Component Value Date/Time   COLORURINE YELLOW 11/05/2020 0726   APPEARANCEUR CLEAR 11/05/2020 0726   LABSPEC >1.030 (H) 11/05/2020 0726   PHURINE 5.5 11/05/2020 0726   GLUCOSEU 100 (A) 11/05/2020 0726   HGBUR SMALL (A) 11/05/2020 0726   BILIRUBINUR SMALL (A) 11/05/2020 0726   KETONESUR NEGATIVE 11/05/2020 0726   PROTEINUR 30 (A) 11/05/2020 0726   NITRITE NEGATIVE 11/05/2020 0726   LEUKOCYTESUR NEGATIVE 11/05/2020 3943     Bonnell Public M.D. [facility] 11/09/2020, 3:19 PM  Available via Epic secure chat 7am-7pm After 7 pm, please refer to night coverage provider listed on amion.

## 2020-11-09 NOTE — Consult Note (Signed)
[facility] Infect[facility]    Date of Admission:  11/05/2020     Reason for Consult: severe sepsis, actinomyces bacteremia, hx AVR x2    Referring Provider: Marcie Bal    Lines:  Peripheral iv  Abx: 3/20-c amp/sulbactam  3/16-19 pip-tazo 3/16 cefepime, metronidazole   Assessment: Actinomyces species bacteremia Recent sepsis/duodenitis  Hx AS s/p TAVR (x2; last one 04/2020 bioprosthetic; tte 11/05/20 no AR) Mitral regurg on tte 11/05/20 troponinemia -- cardiology suspect demand ischemia  While actinomyces bacteremia could at times be transient and of no significance, this patient has high risk for endocarditis and present with severe sepsis, will need to r/o pyogenic complication  Source usually gi or thoraco-abdominal abscess extension if clinically real. She has a noncontrast ct abdomen that suggest periduodenal inflammatory change but clinically no other sx of abd pain otherwise or obvious abscess on this noncontrast ct. Lipase was 25 normal.   While she has no obvious risk (recent abd/chest surgery)/pna to have actinomyces syndrome, it is prudent to pursue non-contrast ct finding   Plan: 1. Change abx to amp-sulbactam; more data and proven for actinomyces species. Should have decent coverage duodenitis  2. Reviewed tte; increased AR, would get tee (scheduled for Monday or Tuesday next week) as actinomyces can cause endocarditis and patient at higher risk  3. Repeat blood culture 4. As actinomyces is often an extension oral/gi -- thoracic-abd abscess, would get pan body ct with contrast if not already done   Active Problems:   Acute respiratory failure with hypoxia (HCC)   Metabolic acidosis   Elevated troponin   Atrial fibrillation with RVR (HCC)   Sepsis (HCC)   Malnutrition of moderate degree   Acute renal failure (HCC)   Scheduled Meds: . apixaban  5 mg Oral BID  . aspirin EC  81 mg Oral Daily  . Chlorhexidine Gluconate Cloth  6 each Topical  Daily  . feeding supplement  1 Container Oral TID BM  . fluticasone furoate-vilanterol  1 puff Inhalation Daily  . loratadine  10 mg Oral Daily  . melatonin  3 mg Oral QHS  . rosuvastatin  20 mg Oral Daily  . umeclidinium bromide  1 puff Inhalation Daily   Continuous Infusions: . sodium chloride Stopped (11/05/20 0847)  . piperacillin-tazobactam (ZOSYN)  IV 3.375 g (11/09/20 0400)   PRN Meds:.sodium chloride, acetaminophen, docusate sodium, fentaNYL (SUBLIMAZE) injection, polyethylene glycol  HPI: Tracy Buck is a 86 y.o. female pmh COPD, HTN, bioprosthetic aortic valve (04/2020), CAD, and lung cancer (carcinoid) s/p lobectomy, admitted for 2 weeks progressive fatigue, found to have afib-rvr, septic shock, and actinomyces bacteremia  Patient was in her usual state of health until 2 weeks pta, when she developed progressive fatigue. She had some adjustment to medication list due to concern for polypharmacy. However, her sx progressed, and a few days before admission fatigue much worse and was feeling very anxious along with epigastric discomfort, prompting her to present to urgent care on 3/16. There ekg showed afib-rvr and she had a temperature of 100 with leukocytosis. Other objectives there include lactate 8.7, troponin 1700. She was given diltiazem infusion but became hypotensive. She was transferred to [facility] and admitted that day for further w/u  Prior heart valve history per dr Ellyn Hack from 3/16 consult note hx of bioprosthetic aortic valve placed of stenosis in 2007 a 21 mm magna valve which was also with stenosis and pt had TAVR 05/01/2020 with a 23 mm  CoreValve and post TAVR fracking with good result  Hospital course: 3/16 fever 103; sbp 90s; afib-rvr wbc 20 lft mildly elevated Cr 1.2 trops elevated Ct chest without contrast showed duodenitis and surrounding inflammatory changes extending into head of pancrea and small bilateral pleural effusion CT abd renal protocol  showed bsAbx started after bcx obtained 3/20 id called as bcx grew actinomyces. Fever had resolved since HD#1. Wbc improving. aki resolved. lft elevation resolved (mild bilirubinemia still). trops flat trend  cardilogy following for dx of new afib-rvr/troponin elevation She is doing better today but still fairly fatigue. There is chart mention of abd pain but she denies. She said epigastric/substernal there is a "weird feeling" like a "discomfort  Loose stools new here while on abx  No recent dental/medical procedures/trauma otherwise   Review of Systems: ROS Other ros negative  Past Medical History:  Diagnosis Date  . Arthritis    "fingers, knees" (09/16/2016)  . Complication of anesthesia    "I often go into severe panic attacks when they try to bring me out and they have to put me back under; sometimes happens when they are starting the anesthesia also" (09/16/2016)  . COPD (chronic obstructive pulmonary disease) ([place])    "I think this has been recently dx'd" (09/16/2016)  . Heart valve replaced   . Hyperlipidemia   . Hypertension   . LBBB (left bundle branch block)   . Lung cancer ([place]) 2013  . Lung mass   . Myocardial infarct ([place])   . Panic attack   . Pneumonia    "when I was very young" (09/16/2016)  . Rib fracture   . Skin cancer of nose     Social History   Tobacco Use  . Smoking status: Never Smoker  . Smokeless tobacco: Never Used  Substance Use Topics  . Alcohol use: Yes    Comment: 09/16/2016 "2-3 glasses of wine when at the beach; once/year"  . Drug use: No    Family History  Problem Relation Age of Onset  . Transient ischemic attack Mother   . Colon cancer Father    Allergies  Allergen Reactions  . Fig Extract [Ficus] Anaphylaxis  . Citalopram Hydrobromide Other (See Comments)    Pt unsure if this is accurate  . Sulfa Antibiotics Other (See Comments)    Severe nervousness.  . Prednisone Other (See Comments)    Other reaction(s): Other (See  Comments) So nervous I could climb the walls So nervous I could climb the walls     OBJECTIVE: Blood pressure 132/78, pulse 62, temperature 98.4 F (36.9 C), resp. rate (!) 31, height 5\' 3"  (1.6 m), weight 78.2 kg, SpO2 96 %.  Physical Exam Constitutional:      General: She is not in acute distress.    Appearance: She is well-developed.     Comments: Conversant; pursed lips breathing  HENT:     Head: Normocephalic.  Eyes:     Pupils: Pupils are equal, round, and reactive to light.  Cardiovascular:     Comments: Tachy, irregular Soft systolic murmur Pulmonary:     Breath sounds: Normal breath sounds.  Chest:     Chest wall: No mass or deformity.  Abdominal:     Palpations: Abdomen is soft.     Tenderness: There is no abdominal tenderness.  Musculoskeletal:        General: Normal range of motion.     Cervical back: Normal range of motion.  Skin:    Comments: A couple  of bruise on bilateral distal LE (said occurs when she rest her leg on each other)  Neurological:     General: No focal deficit present.     Mental Status: She is alert and oriented to person, place, and time.  Psychiatric:        Mood and Affect: Mood normal.     Lab Results Lab Results  Component Value Date   WBC 11.8 (H) 11/09/2020   HGB 11.2 (L) 11/09/2020   HCT 33.0 (L) 11/09/2020   MCV 88.7 11/09/2020   PLT 170 11/09/2020    Lab Results  Component Value Date   CREATININE 0.77 11/09/2020   BUN 9 11/09/2020   NA 130 (L) 11/09/2020   K 3.7 11/09/2020   CL 100 11/09/2020   CO2 22 11/09/2020    Lab Results  Component Value Date   ALT 27 11/09/2020   AST 21 11/09/2020   ALKPHOS 93 11/09/2020   BILITOT 1.3 (H) 11/09/2020     Microbiology: Recent Results (from the past 240 hour(s))  Resp Panel by RT-PCR (Flu A&B, Covid) Nasopharyngeal Swab     Status: None   Collection Time: 11/05/20  7:19 AM   Specimen: Nasopharyngeal Swab; Nasopharyngeal(NP) swabs in vial transport medium  Result  Value Ref Range Status   SARS Coronavirus 2 by RT PCR NEGATIVE NEGATIVE Final    Comment: (NOTE) SARS-CoV-2 target nucleic acids are NOT DETECTED.  The SARS-CoV-2 RNA is generally detectable in upper respiratory specimens during the acute phase of infection. The lowest concentration of SARS-CoV-2 viral copies this assay can detect is 138 copies/mL. A negative result does not preclude SARS-Cov-2 infection and should not be used as the sole basis for treatment or other patient management decisions. A negative result may occur with  improper specimen collection/handling, submission of specimen other than nasopharyngeal swab, presence of viral mutation(s) within the areas targeted by this assay, and inadequate number of viral copies(<138 copies/mL). A negative result must be combined with clinical observations, patient history, and epidemiological information. The expected result is Negative.  Fact Sheet for Patients:  EntrepreneurPulse.com.au  Fact Sheet for Healthcare Providers:  IncredibleEmployment.be  This test is no t yet approved or cleared by the [place] FDA and  has been authorized for detection and/or diagnosis of SARS-CoV-2 by FDA under an Emergency Use Authorization (EUA). This EUA will remain  in effect (meaning this test can be used) for the duration of the COVID-19 declaration under Section 564(b)(1) of the Act, 21 U.S.C.section 360bbb-3(b)(1), unless the authorization is terminated  or revoked sooner.       Influenza A by PCR NEGATIVE NEGATIVE Final   Influenza B by PCR NEGATIVE NEGATIVE Final    Comment: (NOTE) The Xpert Xpress SARS-CoV-2/FLU/RSV plus assay is intended as an aid in the diagnosis of influenza from Nasopharyngeal swab specimens and should not be used as a sole basis for treatment. Nasal washings and aspirates are unacceptable for Xpert Xpress SARS-CoV-2/FLU/RSV testing.  Fact Sheet for  Patients: EntrepreneurPulse.com.au  Fact Sheet for Healthcare Providers: IncredibleEmployment.be  This test is not yet approved or cleared by the [place] FDA and has been authorized for detection and/or diagnosis of SARS-CoV-2 by FDA under an Emergency Use Authorization (EUA). This EUA will remain in effect (meaning this test can be used) for the duration of the COVID-19 declaration under Section 564(b)(1) of the Act, 21 U.S.C. section 360bbb-3(b)(1), unless the authorization is terminated or revoked.  Performed at [facility], [address] [place], [place] [address]   Urine culture     Status: None   Collection Time: 11/05/20  7:26 AM   Specimen: In/Out Cath Urine  Result Value Ref Range Status   Specimen Description   Final    IN/OUT CATH URINE Performed at [facility], [address] [place], [place] [address]    Special Requests   Final    NONE Performed at [facility], [address] [place], [place] [address]    Culture   Final    NO GROWTH Performed at [facility], [place] [address], [place], [place] [address]    Report Status 11/06/2020 FINAL  Final  Blood Culture (routine x 2)     Status: Abnormal   Collection Time: 11/05/20  7:27 AM   Specimen: BLOOD RIGHT HAND  Result Value Ref Range Status   Specimen Description   Final    BLOOD RIGHT HAND Performed at [facility], [address] [place], [place] [address]    Special Requests   Final    BOTTLES DRAWN AEROBIC AND ANAEROBIC Blood Culture adequate volume Performed at [facility], [address] [place], [place] [address]    Culture  Setup Time   Final    GRAM POSITIVE RODS IN BOTH AEROBIC AND ANAEROBIC BOTTLES CRITICAL RESULT CALLED TO, READ BACK BY AND VERIFIED WITH: PHARMD HALEY B. 1936 [id number] FCP    Culture (A)  Final    ACTINOMYCES NEUII Standardized susceptibility testing for this organism  is not available. Performed at [facility], [place] [address] [place], [place] [address]    Report Status 11/09/2020 FINAL  Final  MRSA PCR Screening     Status: None   Collection Time: 11/05/20 11:19 AM   Specimen: Nasal Mucosa; Nasopharyngeal  Result Value Ref Range Status   MRSA by PCR NEGATIVE NEGATIVE Final    Comment:        The GeneXpert MRSA Assay (FDA approved for NASAL specimens only), is one component of a comprehensive MRSA colonization surveillance program. It is not intended to diagnose MRSA infection nor to guide or monitor treatment for MRSA infections. Performed at [facility], [place] [address] [place], [place] [address]      Serology:   Imaging: If present, new imagings (plain films, ct scans, and mri) have been personally visualized and interpreted; radiology reports have been reviewed. Decision making incorporated into the Impression / Recommendations.  3/16 tte 1. S/p TAVR (23 mm evolout pro). MG slightly higher across prosthesis  likely due to Afib with RVR and sepsis. MR is now moderate to severe. TR  is now moderate to severe. Would have low threshold for TEE if the patient  develops bacteremia with prosthetic  AVR and change in MR/TR. No definitive vegetation seen on this TTE.  2. 23 mm evolute pro (ViV procedure for prior 21 mm Magna ease) in the  aortic position. Vmax 2.8 m/s, MG 21 mmHG, EOA 1.18 cm2, DI 0.28. MG  slightly higher than what was reported at [facility] (15 mmHG 05/30/2020),  likely due to Afib and sepsis. The AT is  slightly prolonged ~114 msec. If bacteremia is present would recommend  TEE. The aortic valve has been repaired/replaced. Aortic valve  regurgitation is not visualized. There is a 23 mm CoreValve-Evolut Pro  prosthetic (TAVR) valve present in the aortic  position. Procedure Date: 05/01/2020.  3. Left  ventricular ejection fraction, by estimation, is 50 to 55%. The  left ventricle has low normal function. The  left ventricle has no regional  wall motion abnormalities. There is mild concentric left ventricular  hypertrophy. Left ventricular  diastolic function could not be evaluated.  4. Right ventricular systolic function is moderately reduced. The right  ventricular size is mildly enlarged. There is moderately elevated  pulmonary artery systolic pressure. The estimated right ventricular  systolic pressure is 16.1 mmHg.  5. Right atrial size was mildly dilated.  6. The mitral valve is degenerative. Moderate to severe mitral valve  regurgitation. No evidence of mitral stenosis.  7. The tricuspid valve is abnormal. Tricuspid valve regurgitation is  moderate to severe.  8. The inferior vena cava is dilated in size with >50% respiratory  variability, suggesting right atrial pressure of 8 mmHg.   3/16 ct noncontrast chest and abdomen 1. Inflammatory changes surrounding the duodenum, and adjacent to the head of the pancreas, with extension into the region of the porta hepatis. Findings are concerning for probable duodenitis. No inflammatory changes are noted adjacent to the body or tail of the pancreas, but correlation with lipase levels is also recommended. 2. Small bilateral pleural effusions lying dependently with some associated passive subsegmental atelectasis in the dependent portions of the lungs. 3. Trace volume of ascites most evident adjacent to the liver. 4. Colonic diverticulosis without evidence of acute diverticulitis at this time. 5. Aortic atherosclerosis, in addition to left main and 3 vessel coronary artery disease. Status post CABG and TAVR. 6. All previously noted small pulmonary nodules are stable compared to prior examinations dating back to at least 2019, considered benign. 7. Additional incidental findings, as above   Jabier Mutton, [place] for Infectious [place] [phone] pager    11/09/2020, 10:12 AM

## 2020-11-09 NOTE — Progress Notes (Signed)
Pharmacy Antibiotic Note  Tracy Buck is a 86 y.o. female admitted on 11/05/2020 with sepsis. Patient initially on vanc + cefepime, then stransitioned to zosyn. Pharmacy now consulted for Unasyn dosing for actinomyces bacteremia.    Patient remains afebrile, WBC 13.3. Blood cultures with GPRs in 2/2 bottles (one set). Currently Day 4 of antibiotics on Zosyn.   Plan: Start Unasyn 3g IV q6h  - Monitor WBC, temp, clinical improvement, cultures, renal function - F/u LOT, transition to orals - F/u ID recs   Height: 5\' 3"  (160 cm) Weight: 78.2 kg (172 lb 6.4 oz) IBW/kg (Calculated) : 52.4  Temp (24hrs), Avg:98.2 F (36.8 C), Min:97.8 F (36.6 C), Max:98.7 F (37.1 C)  Recent Labs  Lab 11/05/20 0649 11/05/20 0650 11/05/20 1148 11/05/20 1508 11/06/20 0137 11/07/20 0117 11/08/20 0159 11/09/20 0114  WBC 20.5*  --  26.2*  --  19.1* 14.5* 13.3* 11.8*  CREATININE 1.33*  --  1.20*  --  0.93  --   --  0.77  LATICACIDVEN  --  8.7* 2.2* 2.3*  --   --   --   --     Estimated Creatinine Clearance: 52.7 mL/min (by C-G formula based on SCr of 0.77 mg/dL).    Allergies  Allergen Reactions  . Fig Extract [Ficus] Anaphylaxis  . Citalopram Hydrobromide Other (See Comments)    Pt unsure if this is accurate  . Sulfa Antibiotics Other (See Comments)    Severe nervousness.  . Prednisone Other (See Comments)    Other reaction(s): Other (See Comments) So nervous I could climb the walls So nervous I could climb the walls     Antimicrobials this admission: Vancomycin 3/16>>3/17 Cefepime 3/16 Flagyl x 1 3/16 Zosyn 3/16 >>3/20 Unasyn 3/20 >>    Microbiology results: 3/16 COVID, flu A, flu B: all negative 3/16 MRSA PCR: negative 3/16 Urine cx: negative  3/16 BCx X 1: GPR > Actinomyces neuii  Claudina Lick, PharmD PGY1 Acute Care Pharmacy Resident 11/09/2020 10:20 AM  Please check [email or web address] for unit-specific pharmacy phone numbers.

## 2020-11-09 NOTE — Progress Notes (Signed)
Progress Note  Patient Name: Tracy Buck Date of Encounter: 11/09/2020  [facility] Cardiologist:  [facility] Dr. Bishop Limbo  In [facility]  Subjective   She denies any chest pain.  SOB continues to improve.  Blood cx positive for Actinomyces Neuii   Inpatient Medications    Scheduled Meds: . apixaban  5 mg Oral BID  . aspirin EC  81 mg Oral Daily  . Chlorhexidine Gluconate Cloth  6 each Topical Daily  . feeding supplement  1 Container Oral TID BM  . fluticasone furoate-vilanterol  1 puff Inhalation Daily  . loratadine  10 mg Oral Daily  . melatonin  3 mg Oral QHS  . rosuvastatin  20 mg Oral Daily  . umeclidinium bromide  1 puff Inhalation Daily   Continuous Infusions: . sodium chloride Stopped (11/05/20 0847)  . ampicillin-sulbactam (UNASYN) IV     PRN Meds: sodium chloride, acetaminophen, docusate sodium, fentaNYL (SUBLIMAZE) injection, polyethylene glycol   Vital Signs    Vitals:   11/09/20 0500 11/09/20 0756 11/09/20 0800 11/09/20 0947  BP: 131/70 132/78    Pulse: 100  62   Resp: (!) 25 (!) 31    Temp: 98.2 F (36.8 C)  98.4 F (36.9 C)   TempSrc: Oral     SpO2: 95%   96%  Weight:      Height:        Intake/Output Summary (Last 24 hours) at 11/09/2020 1053 Last data filed at 11/09/2020 0400 Gross per 24 hour  Intake 265 ml  Output --  Net 265 ml   Last 3 Weights 11/07/2020 11/05/2020 11/05/2020  Weight (lbs) 172 lb 6.4 oz 172 lb 6.4 oz 168 lb 1.6 oz  Weight (kg) 78.2 kg 78.2 kg 76.25 kg      Telemetry    Atrial fibrillation with HR in the 90-100's - Personally Reviewed  ECG    N/A  Physical Exam   GEN: ill appearing HEENT: Normal NECK: No JVD; No carotid bruits LYMPHATICS: No lymphadenopathy CARDIAC:irregularly irregular, no murmurs, rubs, gallops RESPIRATORY:  Clear to auscultation without rales, wheezing or rhonchi  ABDOMEN: Soft, non-tender, non-distended MUSCULOSKELETAL:  No edema; No deformity  SKIN: Warm and dry NEUROLOGIC:  Alert  and oriented x 3 PSYCHIATRIC:  Normal affect    Labs    High Sensitivity Troponin:   Recent Labs  Lab 11/05/20 0650 11/05/20 1242 11/05/20 1508  TROPONINIHS 1,716* 1,693* 1,404*      Chemistry Recent Labs  Lab 11/05/20 1148 11/06/20 0137 11/07/20 0117 11/08/20 0159 11/09/20 0114  NA 127* 132*  --   --  130*  K 3.1* 4.3  --   --  3.7  CL 101 102  --   --  100  CO2 18* 24  --   --  22  GLUCOSE 176* 121*  --   --  122*  BUN 15 12  --   --  9  CREATININE 1.20* 0.93  --   --  0.77  CALCIUM 7.8* 7.7*  --   --  7.8*  PROT 4.6* 4.9* 5.0* 5.3* 5.5*  ALBUMIN 2.0* 2.0* 2.0* 2.0* 1.9*  1.9*  AST 83* 58* 43* 23 21  ALT 52* 45* 50* 35 27  ALKPHOS 139* 130* 110 100 93  BILITOT 2.8* 2.3* 1.7* 1.5* 1.3*  GFRNONAA 45* >60  --   --  >60  ANIONGAP 8 6  --   --  8     Hematology Recent Labs  Lab  11/07/20 0117 11/08/20 0159 11/09/20 0114  WBC 14.5* 13.3* 11.8*  RBC 3.71* 3.90 3.72*  HGB 11.3* 11.5* 11.2*  HCT 32.1* 34.2* 33.0*  MCV 86.5 87.7 88.7  MCH 30.5 29.5 30.1  MCHC 35.2 33.6 33.9  RDW 14.9 14.9 14.9  PLT 160 170 170    BNP Recent Labs  Lab 11/05/20 0650  BNP 1,682.9*     DDimer No results for input(s): DDIMER in the last 168 hours.   Radiology    No results found.  Cardiac Studies   Echo 11/05/2020 1. S/p TAVR (23 mm evolout pro). MG slightly higher across prosthesis  likely due to Afib with RVR and sepsis. MR is now moderate to severe. TR  is now moderate to severe. Would have low threshold for TEE if the patient  develops bacteremia with prosthetic  AVR and change in MR/TR. No definitive vegetation seen on this TTE.  2. 23 mm evolute pro (ViV procedure for prior 21 mm Magna ease) in the  aortic position. Vmax 2.8 m/s, MG 21 mmHG, EOA 1.18 cm2, DI 0.28. MG  slightly higher than what was reported at [facility] (15 mmHG 05/30/2020),  likely due to Afib and sepsis. The AT is  slightly prolonged ~114 msec. If bacteremia is present would recommend  TEE.  The aortic valve has been repaired/replaced. Aortic valve  regurgitation is not visualized. There is a 23 mm CoreValve-Evolut Pro  prosthetic (TAVR) valve present in the aortic  position. Procedure Date: 05/01/2020.  3. Left ventricular ejection fraction, by estimation, is 50 to 55%. The  left ventricle has low normal function. The left ventricle has no regional  wall motion abnormalities. There is mild concentric left ventricular  hypertrophy. Left ventricular  diastolic function could not be evaluated.  4. Right ventricular systolic function is moderately reduced. The right  ventricular size is mildly enlarged. There is moderately elevated  pulmonary artery systolic pressure. The estimated right ventricular  systolic pressure is 72.0 mmHg.  5. Right atrial size was mildly dilated.  6. The mitral valve is degenerative. Moderate to severe mitral valve  regurgitation. No evidence of mitral stenosis.  7. The tricuspid valve is abnormal. Tricuspid valve regurgitation is  moderate to severe.  8. The inferior vena cava is dilated in size with >50% respiratory  variability, suggesting right atrial pressure of 8 mmHg.   Patient Profile     86 y.o. 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   Assessment & Plan    1. New onset afib RVR  - Started on IV Cardizem with improved HR then discontinued due to hypotension - HR stable in 90-110s without rate control agent >> will improved with resolving sepsis  - LVEF low normal to 50-55% - continue Apixaban 5mg  BID (CHADS2VASC score 5)  2. Elevated troponin  - not ACS pattern >>flat trend ([id number]) - she has never had CP and no focal WMA on echo - Likely demand ischemia  3. Valvular heart dz - Echo with LVEF  of 50-55% - S/p TAVR (23 mm evolout pro). MG 21 mmHG, EOA 1.18 cm2, DI 0.28. MG  slightly higher than what was reported at [facility] (15 mmHG 05/30/2020),  likely due to Afib and sepsis. - MR is now moderate to severe.  TR  is now moderate to severe.  - The increased gradient across the valve is probably related to A. fib RVR and sepsis - Blood cx now positive for Actinomyces Neuii - ID wants TEE>>scheduled is full in Endo for tomorrow>>will try to get on for Tuesday Shared Decision Making/Informed Consent The risks [esophageal damage, perforation (1:10,000 risk), bleeding, pharyngeal hematoma as well as other potential complications associated with conscious sedation including aspiration, arrhythmia, respiratory failure and death], benefits (treatment guidance and diagnostic support) and alternatives of a transesophageal echocardiogram were discussed in detail with Ms. Earleen Newport and she is willing to proceed.   For questions or updates, please contact [place] Please consult [email or web address] for contact info under        Signed, Fransico Him, MD  11/09/2020, 10:53 AM

## 2020-11-09 NOTE — Plan of Care (Signed)
  Problem: Education: Goal: Knowledge of General Education information will improve Description Including pain rating scale, medication(s)/side effects and non-pharmacologic comfort measures Outcome: Progressing   

## 2020-11-10 DIAGNOSIS — N179 Acute kidney failure, unspecified: Secondary | ICD-10-CM | POA: Diagnosis not present

## 2020-11-10 DIAGNOSIS — I4891 Unspecified atrial fibrillation: Secondary | ICD-10-CM | POA: Diagnosis not present

## 2020-11-10 DIAGNOSIS — R6521 Severe sepsis with septic shock: Secondary | ICD-10-CM | POA: Diagnosis not present

## 2020-11-10 DIAGNOSIS — R5383 Other fatigue: Secondary | ICD-10-CM

## 2020-11-10 DIAGNOSIS — A427 Actinomycotic sepsis: Secondary | ICD-10-CM | POA: Diagnosis not present

## 2020-11-10 DIAGNOSIS — J9601 Acute respiratory failure with hypoxia: Secondary | ICD-10-CM | POA: Diagnosis not present

## 2020-11-10 DIAGNOSIS — E872 Acidosis: Secondary | ICD-10-CM | POA: Diagnosis not present

## 2020-11-10 DIAGNOSIS — A419 Sepsis, unspecified organism: Secondary | ICD-10-CM | POA: Diagnosis not present

## 2020-11-10 DIAGNOSIS — I482 Chronic atrial fibrillation, unspecified: Secondary | ICD-10-CM

## 2020-11-10 DIAGNOSIS — A429 Actinomycosis, unspecified: Secondary | ICD-10-CM

## 2020-11-10 DIAGNOSIS — R7881 Bacteremia: Secondary | ICD-10-CM | POA: Diagnosis not present

## 2020-11-10 DIAGNOSIS — R778 Other specified abnormalities of plasma proteins: Secondary | ICD-10-CM | POA: Diagnosis not present

## 2020-11-10 LAB — BASIC METABOLIC PANEL
Anion gap: 6 (ref 5–15)
BUN: 6 mg/dL — ABNORMAL LOW (ref 8–23)
CO2: 21 mmol/L — ABNORMAL LOW (ref 22–32)
Calcium: 7.7 mg/dL — ABNORMAL LOW (ref 8.9–10.3)
Chloride: 102 mmol/L (ref 98–111)
Creatinine, Ser: 0.82 mg/dL (ref 0.44–1.00)
GFR, Estimated: >60 mL/min (ref >60)
Glucose, Bld: 231 mg/dL — ABNORMAL HIGH (ref 70–99)
Potassium: 3.6 mmol/L (ref 3.5–5.1)
Sodium: 129 mmol/L — ABNORMAL LOW (ref 135–145)

## 2020-11-10 LAB — HEPATIC FUNCTION PANEL
ALT: 34 U/L (ref 0–44)
AST: 34 U/L (ref 15–41)
Albumin: 1.9 g/dL — ABNORMAL LOW (ref 3.5–5.0)
Alkaline Phosphatase: 86 U/L (ref 38–126)
Bilirubin, Direct: 0.3 mg/dL — ABNORMAL HIGH (ref 0.0–0.2)
Indirect Bilirubin: 1 mg/dL — ABNORMAL HIGH (ref 0.3–0.9)
Total Bilirubin: 1.3 mg/dL — ABNORMAL HIGH (ref 0.3–1.2)
Total Protein: 5.4 g/dL — ABNORMAL LOW (ref 6.5–8.1)

## 2020-11-10 LAB — CBC
HCT: 32.6 % — ABNORMAL LOW (ref 36.0–46.0)
Hemoglobin: 10.7 g/dL — ABNORMAL LOW (ref 12.0–15.0)
MCH: 29.4 pg (ref 26.0–34.0)
MCHC: 32.8 g/dL (ref 30.0–36.0)
MCV: 89.6 fL (ref 80.0–100.0)
Platelets: 185 10*3/uL (ref 150–400)
RBC: 3.64 MIL/uL — ABNORMAL LOW (ref 3.87–5.11)
RDW: 14.7 % (ref 11.5–15.5)
WBC: 11.2 10*3/uL — ABNORMAL HIGH (ref 4.0–10.5)
nRBC: 0 % (ref 0.0–0.2)

## 2020-11-10 MED ORDER — IPRATROPIUM-ALBUTEROL 0.5-2.5 (3) MG/3ML IN SOLN
3.0000 mL | RESPIRATORY_TRACT | Status: DC | PRN
  Filled 2020-11-10: qty 3

## 2020-11-10 MED ORDER — LORAZEPAM 2 MG/ML IJ SOLN
INTRAMUSCULAR | Status: AC
  Administered 2020-11-10: 0.5 mg via INTRAVENOUS
  Filled 2020-11-10: qty 1

## 2020-11-10 MED ORDER — DILTIAZEM HCL 30 MG PO TABS
30.0000 mg | ORAL_TABLET | Freq: Four times a day (QID) | ORAL | Status: DC
  Administered 2020-11-10 – 2020-11-12 (×7): 30 mg via ORAL
  Filled 2020-11-10 (×7): qty 1

## 2020-11-10 MED ORDER — LORAZEPAM 2 MG/ML IJ SOLN
0.5000 mg | INTRAMUSCULAR | Status: DC | PRN
  Administered 2020-11-11 – 2020-11-17 (×5): 0.5 mg via INTRAVENOUS
  Filled 2020-11-10 (×6): qty 1

## 2020-11-10 MED ORDER — SODIUM CHLORIDE 0.9 % IV SOLN: INTRAVENOUS | Status: DC

## 2020-11-10 NOTE — Progress Notes (Signed)
Pt down for CT scan via bed, accompanied by Transport tech.

## 2020-11-10 NOTE — Progress Notes (Signed)
Progress Note  Patient Name: Tracy Buck Date of Encounter: 11/10/2020  [facility] Cardiologist:  [facility] Dr. Bishop Limbo  In [facility]  Subjective   Overall feels ok but would like to sit up in the chair. Pursed lip breathing which she states is her habit for many years.  Inpatient Medications    Scheduled Meds: . apixaban  5 mg Oral BID  . aspirin EC  81 mg Oral Daily  . Chlorhexidine Gluconate Cloth  6 each Topical Daily  . feeding supplement  1 Container Oral TID BM  . fluticasone furoate-vilanterol  1 puff Inhalation Daily  . loratadine  10 mg Oral Daily  . melatonin  3 mg Oral QHS  . rosuvastatin  20 mg Oral Daily  . umeclidinium bromide  1 puff Inhalation Daily   Continuous Infusions: . sodium chloride Stopped (11/05/20 0847)  . ampicillin-sulbactam (UNASYN) IV 3 g (11/10/20 0534)   PRN Meds: sodium chloride, acetaminophen, docusate sodium, fentaNYL (SUBLIMAZE) injection, polyethylene glycol   Vital Signs    Vitals:   11/09/20 2339 11/10/20 0322 11/10/20 0331 11/10/20 0755  BP: 128/66 132/72    Pulse: (!) 106 (!) 111 99   Resp:  (!) 26    Temp: 98.2 F (36.8 C) 97.8 F (36.6 C)    TempSrc: Oral Oral    SpO2:    99%  Weight:      Height:        Intake/Output Summary (Last 24 hours) at 11/10/2020 0947 Last data filed at 11/10/2020 0321 Gross per 24 hour  Intake 540.07 ml  Output --  Net 540.07 ml   Last 3 Weights 11/07/2020 11/05/2020 11/05/2020  Weight (lbs) 172 lb 6.4 oz 172 lb 6.4 oz 168 lb 1.6 oz  Weight (kg) 78.2 kg 78.2 kg 76.25 kg      Telemetry    Atrial fibrillation with HR in the 110's - Personally Reviewed  ECG    No new  Physical Exam   GEN: No acute distress.   Neck: No JVD Cardiac: irregularly irregular, tachycardic. 2/6 SEM  Respiratory: Clear to auscultation bilaterally. GI: Soft, nontender, non-distended  MS: No edema; No deformity. Neuro:  Nonfocal  Psych: Normal affect   Labs    High Sensitivity Troponin:    Recent Labs  Lab 11/05/20 0650 11/05/20 1242 11/05/20 1508  TROPONINIHS 1,716* 1,693* 1,404*      Chemistry Recent Labs  Lab 11/05/20 1148 11/06/20 0137 11/07/20 0117 11/08/20 0159 11/09/20 0114 11/10/20 0215  NA 127* 132*  --   --  130*  --   K 3.1* 4.3  --   --  3.7  --   CL 101 102  --   --  100  --   CO2 18* 24  --   --  22  --   GLUCOSE 176* 121*  --   --  122*  --   BUN 15 12  --   --  9  --   CREATININE 1.20* 0.93  --   --  0.77  --   CALCIUM 7.8* 7.7*  --   --  7.8*  --   PROT 4.6* 4.9*   < > 5.3* 5.5* 5.4*  ALBUMIN 2.0* 2.0*   < > 2.0* 1.9*  1.9* 1.9*  AST 83* 58*   < > 23 21 34  ALT 52* 45*   < > 35 27 34  ALKPHOS 139* 130*   < > 100 93 86  BILITOT  2.8* 2.3*   < > 1.5* 1.3* 1.3*  GFRNONAA 45* >60  --   --  >60  --   ANIONGAP 8 6  --   --  8  --    < > = values in this interval not displayed.     Hematology Recent Labs  Lab 11/08/20 0159 11/09/20 0114 11/10/20 0215  WBC 13.3* 11.8* 11.2*  RBC 3.90 3.72* 3.64*  HGB 11.5* 11.2* 10.7*  HCT 34.2* 33.0* 32.6*  MCV 87.7 88.7 89.6  MCH 29.5 30.1 29.4  MCHC 33.6 33.9 32.8  RDW 14.9 14.9 14.7  PLT 170 170 185    BNP Recent Labs  Lab 11/05/20 0650  BNP 1,682.9*     DDimer No results for input(s): DDIMER in the last 168 hours.   Radiology    CT CHEST ABDOMEN PELVIS W CONTRAST  Addendum Date: 11/10/2020   ADDENDUM REPORT: 11/10/2020 00:29 ADDENDUM: Additional impression point: New edematous changes and skin thickening across the mons pubis. Possibly related to some developing body wall edema though recommend correlation with direct visualization to exclude cellulitis. Addendum will be called to the ordering clinician or representative by the Radiologist Assistant, and communication documented in the PACS or [employer]. Electronically Signed   By: Lovena Le M.D.   On: 11/10/2020 00:29   Result Date: 11/10/2020 CLINICAL DATA:  Sepsis, history of COPD, hypertension, bioprosthetic aortic valve  replacement, CAD and carcinoid lung cancer post lobectomy EXAM: CT CHEST, ABDOMEN, AND PELVIS WITH CONTRAST TECHNIQUE: Multidetector CT imaging of the chest, abdomen and pelvis was performed following the standard protocol during bolus administration of intravenous contrast. CONTRAST:  13mL OMNIPAQUE IOHEXOL 300 MG/ML  SOLN COMPARISON:  CT 11/05/2020 FINDINGS: CT CHEST FINDINGS Cardiovascular: Cardiac size is top normal. No sizable pericardial effusion, abnormal thickening or pericardial calcification. Coronary artery calcifications are present. Post TAVR. Mitral annular calcifications are seen. Prior sternotomy and CABG. Atherosclerotic plaque within the normal caliber aorta. Calcifications present in the proximal great vessels with shared origin of brachiocephalic and left common carotid artery. No acute abnormality the aorta or proximal great vessels. Central pulmonary arteries are normal caliber. No large central filling defects within the limitations of this non tailored examination of the pulmonary arteries. Mediastinum/Nodes: Postsurgical changes anterior mediastinum related to prior sternotomy and CABG. No mediastinal fluid or gas. Normal thyroid gland and thoracic inlet. No acute abnormality of the trachea or esophagus. No worrisome mediastinal, hilar or axillary adenopathy. Surgical material in the left hilum likely related prior lobectomy. Lungs/Pleura: Prior left lower lobectomy with relative hyperexpansion of the left upper lobe. Redemonstration of the scattered small pulmonary nodules in both lungs, largest in the left upper lobe measuring up to 7 mm in size (5/76). No new conspicuous or enlarging pulmonary nodules are seen. There are small bilateral pleural effusions with slightly lobular margins which could reflect some underlying loculation. Adjacent areas of passive atelectasis in both lung bases. Underlying airspace disease is difficult to fully exclude. Some mild interlobular septal thickening is  noted towards the lung apices. Mild pulmonary vascular redistribution is noted as well. Minimal airways thickening and scattered secretions. Musculoskeletal: Prior sternotomy with intact and aligned sternal sutures. No aggressive lytic or blastic lesions. Exaggerated thoracic kyphosis and mild dextrocurvature of the spine. Multilevel degenerative changes are present in the imaged portions of the spine. Additional degenerative changes in the shoulders. CT ABDOMEN PELVIS FINDINGS Hepatobiliary: Hypoattenuation adjacent the falciform ligament likely reflecting some regions of focal fatty infiltration. More diffuse hepatic  hypoattenuation likely reflecting more mild global hepatic steatosis. Smooth liver surface contour. No concerning focal liver lesion. Gallbladder appears surgically absent. Slight prominence of the biliary tree is similar to comparison imaging and likely reflect some post cholecystectomy reservoir effect. Pancreas: No pancreatic ductal dilatation or surrounding inflammatory changes. Spleen: Normal in size. No concerning splenic lesions. Adrenals/Urinary Tract: Normal adrenal glands. The kidneys enhance and excrete symmetrically. Redemonstration of an exophytic, lobular fluid attenuation cyst arising from the upper pole right kidney. Additional subcentimeter hypoattenuating foci are present in both kidneys too small to fully characterize on CT imaging but statistically likely benign. Bilateral extrarenal pelves are again seen. No concerning renal mass, obstructive urolithiasis or hydronephrosis. Stomach/Bowel: Small hiatal hernia. Stomach is otherwise unremarkable. The previously seen inflammatory changes centered upon the duodenum near the porta hepatis/gallbladder fossa have significantly diminished from the comparison exam. No extraluminal gas, organized abscess or collection is seen. Noninflamed small air-filled duodenal diverticulum near the ligament of Treitz (6/57). Normal sweep across the  midline abdomen. No small bowel thickening or dilatation. No evidence of obstruction. No colonic dilatation or wall thickening. Scattered colonic diverticula without focal inflammation to suggest diverticulitis. Vascular/Lymphatic: Atherosclerotic calcifications within the abdominal aorta and branch vessels. No aneurysm or ectasia. No enlarged abdominopelvic lymph nodes. Reproductive: Normal appearance of the uterus and adnexal structures. Other: Mild body wall edema most pronounced in the posterior midline and flanks. Some slightly more focal edematous change in skin thickening noted along the mons pubis, new from prior. Tiny fat containing umbilical hernia. No bowel containing hernias. No free air or fluid. Notable resolution of the previously seen perihepatic ascites. No organized abscess or collection. Musculoskeletal: No acute osseous abnormality or suspicious osseous lesion. Stable hemangioma L1. Multilevel degenerative changes are present in the imaged portions of the spine. Additional degenerative changes in the hips and pelvis. IMPRESSION: 1. The previously seen inflammatory changes centered upon the duodenum near the porta hepatis/gallbladder fossa have significantly diminished from the comparison exam. No extraluminal gas, organized abscess or collection is seen. 2. Small bilateral pleural effusions with slightly lobular margins which could reflect some underlying loculation. Adjacent areas of passive atelectasis in both lung bases. Underlying airspace disease is difficult to fully exclude. 3. Mild pulmonary vascular redistribution, suggestive of mild interstitial edema. 4. Stable appearance of the scattered small pulmonary nodules in both lungs, largest in the left upper lobe measuring up to 7 mm in size. 5. Colonic diverticulosis without evidence of diverticulitis. 6. Hepatic steatosis. 7. Coronary atherosclerosis with evidence of prior sternotomy, CABG and TAVR. 8. Aortic Atherosclerosis (ICD10-I70.0).  Electronically Signed: By: Lovena Le M.D. On: 11/09/2020 23:13    Cardiac Studies   Echo 11/05/2020 1. S/p TAVR (23 mm evolout pro). MG slightly higher across prosthesis  likely due to Afib with RVR and sepsis. MR is now moderate to severe. TR  is now moderate to severe. Would have low threshold for TEE if the patient  develops bacteremia with prosthetic  AVR and change in MR/TR. No definitive vegetation seen on this TTE.  2. 23 mm evolute pro (ViV procedure for prior 21 mm Magna ease) in the  aortic position. Vmax 2.8 m/s, MG 21 mmHG, EOA 1.18 cm2, DI 0.28. MG  slightly higher than what was reported at [facility] (15 mmHG 05/30/2020),  likely due to Afib and sepsis. The AT is  slightly prolonged ~114 msec. If bacteremia is present would recommend  TEE. The aortic valve has been repaired/replaced. Aortic valve  regurgitation is not visualized.  There is a 23 mm CoreValve-Evolut Pro  prosthetic (TAVR) valve present in the aortic  position. Procedure Date: 05/01/2020.  3. Left ventricular ejection fraction, by estimation, is 50 to 55%. The  left ventricle has low normal function. The left ventricle has no regional  wall motion abnormalities. There is mild concentric left ventricular  hypertrophy. Left ventricular  diastolic function could not be evaluated.  4. Right ventricular systolic function is moderately reduced. The right  ventricular size is mildly enlarged. There is moderately elevated  pulmonary artery systolic pressure. The estimated right ventricular  systolic pressure is 98.3 mmHg.  5. Right atrial size was mildly dilated.  6. The mitral valve is degenerative. Moderate to severe mitral valve  regurgitation. No evidence of mitral stenosis.  7. The tricuspid valve is abnormal. Tricuspid valve regurgitation is  moderate to severe.  8. The inferior vena cava is dilated in size with >50% respiratory  variability, suggesting right atrial pressure of 8 mmHg.   Patient  Profile     86 y.o. 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   Assessment & Plan    1. New onset afib RVR  - Started on IV Cardizem with improved HR then discontinued due to hypotension - HR seems slightly higher today. Consider starting low dose rate control. Will start with diltiazem 30 mg Q6hr, can decrease or change as needed for hypotension.  - LVEF low normal to 50-55% - continue Apixaban 5mg  BID (CHADS2VASC score 5). Would ideally consider DCCV with TEE tomorrow however with bacteremia and sepsis, will hold off. Consider outpatient DCCV if indicated in 3-4 weeks after [facility].   2. Elevated troponin  - not ACS pattern >>flat trend ([id number]) - she has never had CP and no focal WMA on echo - Likely demand ischemia, monitor for chest pain or HF symptoms.  3. Valvular heart dz - Echo with LVEF of 50-55% - S/p TAVR (23 mm evolout pro). MG 21 mmHG, EOA 1.18 cm2, DI 0.28. MG  slightly higher than what was reported at [facility] (15 mmHG 05/30/2020),  likely due to Afib and sepsis. - MR is now moderate to severe. TR  is now moderate to severe.  - The increased gradient across the valve is probably related to A. 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 Consent obtained by A. Duke PA, immediately before my visit today. See separate documentation.  For questions or updates, please contact Jay Please consult [email or web address] for contact info under        Signed, Elouise Munroe, MD  11/10/2020, 9:47 AM

## 2020-11-10 NOTE — Progress Notes (Signed)
Physical Therapy Treatment Patient Details Name: Tracy Buck MRN: [id number] DOB: Nov 04, 1934 Today's Date: 11/10/2020    History of Present Illness 86 yo admitted to [facility] 3/16 with sepsis and hypoxia requiring Bipap with transfer to [facility] pt with Afib with RVR. PMhx: COPD, HTN, TAVR, CAD, lung CA s/p lobectomy, HLD    PT Comments    Patient up in chair on arrival. Feels weak overall. Able to walk 30 ft with RW in the room with sats and HR WNL but RR up to 42. Seated recovery ~3 minutes with RR 26 (pt cued to use pursed lip breathing). Patient did not feel she could walk again, but did perform standing exercises. Daughter present throughout.     Follow Up Recommendations  Home health PT;Supervision/Assistance - 24 hour     Equipment Recommendations  None recommended by PT    Recommendations for Other Services       Precautions / Restrictions Precautions Precautions: Fall Restrictions Weight Bearing Restrictions: No    Mobility  Bed Mobility               General bed mobility comments: up in recliner    Transfers Overall transfer level: Needs assistance Equipment used: Rolling walker (2 wheeled) Transfers: Sit to/from Stand Sit to Stand: Min assist;Min guard         General transfer comment: initial transfer with light assist to boost to standing; remaining 7 transfers with min guard assist and cues for hand placement  Ambulation/Gait Ambulation/Gait assistance: Min assist Gait Distance (Feet): 30 Feet Assistive device: Rolling walker (2 wheeled) Gait Pattern/deviations: Step-through pattern;Decreased stride length;Wide base of support Gait velocity: decreased   General Gait Details: slow, guarded gait, preferred to use RW as feeling weak and unsteady   Stairs             Wheelchair Mobility    Modified Rankin (Stroke Patients Only)       Balance Overall balance assessment: Needs assistance   Sitting balance-Leahy Scale:  Fair Sitting balance - Comments: pt able to sit EOB without UE support   Standing balance support: Bilateral upper extremity supported;Single extremity supported;During functional activity Standing balance-Leahy Scale: Poor Standing balance comment: reliant on external support                            Cognition Arousal/Alertness: Awake/alert Behavior During Therapy: WFL for tasks assessed/performed Overall Cognitive Status: Within Functional Limits for tasks assessed                                        Exercises Other Exercises Other Exercises: sit to stand x 5 consecutive reps Other Exercises: standing pre-gait x 2 minutes    General Comments General comments (skin integrity, edema, etc.): RA sats 98% HR 82 RR 26; with activity sats 95% HR 102 RR 42      Pertinent Vitals/Pain      Home Living                      Prior Function            PT Goals (current goals can now be found in the care plan section) Acute Rehab PT Goals Patient Stated Goal: home Time For Goal Achievement: 11/20/20 Potential to Achieve Goals: Good Progress towards PT goals: Progressing toward goals  Frequency    Min 3X/week      PT Plan Current plan remains appropriate    Co-evaluation              AM-PAC PT "6 Clicks" Mobility   Outcome Measure  Help needed turning from your back to your side while in a flat bed without using bedrails?: A Little Help needed moving from lying on your back to sitting on the side of a flat bed without using bedrails?: A Little Help needed moving to and from a bed to a chair (including a wheelchair)?: A Little Help needed standing up from a chair using your arms (e.g., wheelchair or bedside chair)?: A Little Help needed to walk in hospital room?: A Little Help needed climbing 3-5 steps with a railing? : A Lot 6 Click Score: 17    End of Session Equipment Utilized During Treatment: Gait belt Activity  Tolerance: Patient limited by fatigue Patient left: in chair;with call bell/phone within reach;with family/visitor present   PT Visit Diagnosis: Other abnormalities of gait and mobility (R26.89);Difficulty in walking, not elsewhere classified (R26.2);Muscle weakness (generalized) (M62.81)     Time: [id number] PT Time Calculation (min) (ACUTE ONLY): 24 min  Charges:  $Gait Training: 23-37 mins                      Arby Barrette, PT Pager [phone]    Rexanne Mano 11/10/2020, 1:52 PM

## 2020-11-10 NOTE — Progress Notes (Addendum)
Subjective: He is a bit fatigued   Antibiotics:  Anti-infectives (From admission, onward)   Start     Dose/Rate Route Frequency Ordered Stop   11/09/20 1130  Ampicillin-Sulbactam (UNASYN) 3 g in sodium chloride 0.9 % 100 mL IVPB        3 g 200 mL/hr over 30 Minutes Intravenous Every 6 hours 11/09/20 1025     11/06/20 0600  vancomycin (VANCOREADY) IVPB 750 mg/150 mL  Status:  Discontinued        750 mg 150 mL/hr over 60 Minutes Intravenous Every 24 hours 11/05/20 0757 11/06/20 0745   11/05/20 2000  ceFEPIme (MAXIPIME) 2 g in sodium chloride 0.9 % 100 mL IVPB  Status:  Discontinued        2 g 200 mL/hr over 30 Minutes Intravenous Every 12 hours 11/05/20 0757 11/05/20 1704   11/05/20 1745  piperacillin-tazobactam (ZOSYN) IVPB 3.375 g  Status:  Discontinued        3.375 g 12.5 mL/hr over 240 Minutes Intravenous Every 8 hours 11/05/20 1720 11/09/20 1015   11/05/20 0730  ceFEPIme (MAXIPIME) 2 g in sodium chloride 0.9 % 100 mL IVPB        2 g 200 mL/hr over 30 Minutes Intravenous  Once 11/05/20 0716 11/05/20 0814   11/05/20 0730  metroNIDAZOLE (FLAGYL) IVPB 500 mg        500 mg 100 mL/hr over 60 Minutes Intravenous  Once 11/05/20 0716 11/05/20 0850   11/05/20 0730  vancomycin (VANCOCIN) IVPB 1000 mg/200 mL premix        1,000 mg 200 mL/hr over 60 Minutes Intravenous  Once 11/05/20 0716 11/05/20 0914      Medications: Scheduled Meds: . apixaban  5 mg Oral BID  . aspirin EC  81 mg Oral Daily  . Chlorhexidine Gluconate Cloth  6 each Topical Daily  . diltiazem  30 mg Oral Q6H  . feeding supplement  1 Container Oral TID BM  . fluticasone furoate-vilanterol  1 puff Inhalation Daily  . loratadine  10 mg Oral Daily  . melatonin  3 mg Oral QHS  . rosuvastatin  20 mg Oral Daily  . umeclidinium bromide  1 puff Inhalation Daily   Continuous Infusions: . sodium chloride Stopped (11/05/20 0847)  . ampicillin-sulbactam (UNASYN) IV 3 g (11/10/20 1123)   PRN Meds:.sodium  chloride, acetaminophen, docusate sodium, fentaNYL (SUBLIMAZE) injection, polyethylene glycol    Objective: Weight change:   Intake/Output Summary (Last 24 hours) at 11/10/2020 1231 Last data filed at 11/10/2020 0321 Gross per 24 hour  Intake 258.25 ml  Output --  Net 258.25 ml   Blood pressure 121/80, pulse 98, temperature 97.8 F (36.6 C), temperature source Oral, resp. rate 16, height 5\' 3"  (1.6 m), weight 78.2 kg, SpO2 95 %. Temp:  [97.8 F (36.6 C)-98.2 F (36.8 C)] 97.8 F (36.6 C) (03/21 0322) Pulse Rate:  [75-111] 98 (03/21 1151) Resp:  [16-26] 16 (03/21 1151) BP: (98-132)/(66-86) 121/80 (03/21 1151) SpO2:  [95 %-99 %] 95 % (03/21 1151)  Physical Exam: Physical Exam Constitutional:      General: She is not in acute distress.    Appearance: She is well-developed. She is not diaphoretic.  HENT:     Head: Normocephalic and atraumatic.     Right Ear: External ear normal.     Left Ear: External ear normal.     Mouth/Throat:     Dentition: Dental caries present.     Pharynx: No  oropharyngeal exudate.   Eyes:     General: No scleral icterus.    Conjunctiva/sclera: Conjunctivae normal.     Pupils: Pupils are equal, round, and reactive to light.  Cardiovascular:     Rate and Rhythm: Normal rate. Rhythm irregular.     Heart sounds: Murmur heard.  No friction rub. No gallop.   Pulmonary:     Effort: Pulmonary effort is normal. No respiratory distress.     Breath sounds: Normal breath sounds. No stridor. No wheezing, rhonchi or rales.  Abdominal:     General: Bowel sounds are normal. There is no distension.     Palpations: Abdomen is soft.     Tenderness: There is no abdominal tenderness. There is no rebound.  Musculoskeletal:        General: No tenderness. Normal range of motion.  Lymphadenopathy:     Cervical: No cervical adenopathy.  Skin:    General: Skin is warm and dry.     Coloration: Skin is pale.     Findings: No erythema or rash.  Neurological:      General: No focal deficit present.     Mental Status: She is alert and oriented to person, place, and time.     Motor: No abnormal muscle tone.     Coordination: Coordination normal.  Psychiatric:        Mood and Affect: Mood normal.        Behavior: Behavior normal.        Thought Content: Thought content normal.        Judgment: Judgment normal.      CBC:    BMET Recent Labs    11/09/20 0114 11/10/20 1019  NA 130* 129*  K 3.7 3.6  CL 100 102  CO2 22 21*  GLUCOSE 122* 231*  BUN 9 6*  CREATININE 0.77 0.82  CALCIUM 7.8* 7.7*     Liver Panel  Recent Labs    11/09/20 0114 11/10/20 0215  PROT 5.5* 5.4*  ALBUMIN 1.9*  1.9* 1.9*  AST 21 34  ALT 27 34  ALKPHOS 93 86  BILITOT 1.3* 1.3*  BILIDIR 0.4* 0.3*  IBILI 0.9 1.0*       Sedimentation Rate No results for input(s): ESRSEDRATE in the last 72 hours. C-Reactive Protein No results for input(s): CRP in the last 72 hours.  Micro Results: Recent Results (from the past 720 hour(s))  Resp Panel by RT-PCR (Flu A&B, Covid) Nasopharyngeal Swab     Status: None   Collection Time: 11/05/20  7:19 AM   Specimen: Nasopharyngeal Swab; Nasopharyngeal(NP) swabs in vial transport medium  Result Value Ref Range Status   SARS Coronavirus 2 by RT PCR NEGATIVE NEGATIVE Final    Comment: (NOTE) SARS-CoV-2 target nucleic acids are NOT DETECTED.  The SARS-CoV-2 RNA is generally detectable in upper respiratory specimens during the acute phase of infection. The lowest concentration of SARS-CoV-2 viral copies this assay can detect is 138 copies/mL. A negative result does not preclude SARS-Cov-2 infection and should not be used as the sole basis for treatment or other patient management decisions. A negative result may occur with  improper specimen collection/handling, submission of specimen other than nasopharyngeal swab, presence of viral mutation(s) within the areas targeted by this assay, and inadequate number of  viral copies(<138 copies/mL). A negative result must be combined with clinical observations, patient history, and epidemiological information. The expected result is Negative.  Fact Sheet for Patients:  EntrepreneurPulse.com.au  Fact Sheet for Healthcare Providers:  IncredibleEmployment.be  This test is no t yet approved or cleared by the [place] and  has been authorized for detection and/or diagnosis of SARS-CoV-2 by FDA under an Emergency Use Authorization (EUA). This EUA will remain  in effect (meaning this test can be used) for the duration of the COVID-19 declaration under Section 564(b)(1) of the Act, 21 U.S.C.section 360bbb-3(b)(1), unless the authorization is terminated  or revoked sooner.       Influenza A by PCR NEGATIVE NEGATIVE Final   Influenza B by PCR NEGATIVE NEGATIVE Final    Comment: (NOTE) The Xpert Xpress SARS-CoV-2/FLU/RSV plus assay is intended as an aid in the diagnosis of influenza from Nasopharyngeal swab specimens and should not be used as a sole basis for treatment. Nasal washings and aspirates are unacceptable for Xpert Xpress SARS-CoV-2/FLU/RSV testing.  Fact Sheet for Patients: EntrepreneurPulse.com.au  Fact Sheet for Healthcare Providers: IncredibleEmployment.be  This test is not yet approved or cleared by the [place] FDA and has been authorized for detection and/or diagnosis of SARS-CoV-2 by FDA under an Emergency Use Authorization (EUA). This EUA will remain in effect (meaning this test can be used) for the duration of the COVID-19 declaration under Section 564(b)(1) of the Act, 21 U.S.C. section 360bbb-3(b)(1), unless the authorization is terminated or revoked.  Performed at [facility], [address] [place], [place] [address]   Urine culture     Status: None   Collection Time: 11/05/20  7:26 AM   Specimen: In/Out Cath Urine  Result  Value Ref Range Status   Specimen Description   Final    IN/OUT CATH URINE Performed at [facility], [address] [place], [place] [address]    Special Requests   Final    NONE Performed at [facility], [address] [place], [place] [address]    Culture   Final    NO GROWTH Performed at [facility], [place] [address], [place], [place] [address]    Report Status 11/06/2020 FINAL  Final  Blood Culture (routine x 2)     Status: Abnormal   Collection Time: 11/05/20  7:27 AM   Specimen: BLOOD RIGHT HAND  Result Value Ref Range Status   Specimen Description   Final    BLOOD RIGHT HAND Performed at [facility], [address] [place], [place] [address]    Special Requests   Final    BOTTLES DRAWN AEROBIC AND ANAEROBIC Blood Culture adequate volume Performed at [facility], [address] [place], [place] [address]    Culture  Setup Time   Final    GRAM POSITIVE RODS IN BOTH AEROBIC AND ANAEROBIC BOTTLES CRITICAL RESULT CALLED TO, READ BACK BY AND VERIFIED WITH: PHARMD HALEY B. 1936 [id number] FCP    Culture (A)  Final    ACTINOMYCES NEUII Standardized susceptibility testing for this organism is not available. Performed at [facility], [place] [address] [place], [place] [address]    Report Status 11/09/2020 FINAL  Final  MRSA PCR Screening     Status: None   Collection Time: 11/05/20 11:19 AM   Specimen: Nasal Mucosa; Nasopharyngeal  Result Value Ref Range Status   MRSA by PCR NEGATIVE NEGATIVE Final    Comment:        The GeneXpert MRSA Assay (FDA approved for NASAL specimens only), is one component of a comprehensive MRSA colonization surveillance program. It is  not intended to diagnose MRSA infection nor to guide or monitor treatment for MRSA infections. Performed at [facility], [place] [address] [place], [place] [address]   Culture, blood (routine x 2)     Status: None (Preliminary result)    Collection Time: 11/09/20 10:16 AM   Specimen: BLOOD LEFT HAND  Result Value Ref Range Status   Specimen Description BLOOD LEFT HAND  Final   Special Requests   Final    BOTTLES DRAWN AEROBIC ONLY Blood Culture results may not be optimal due to an inadequate volume of blood received in culture bottles   Culture   Final    NO GROWTH < 24 HOURS Performed at [facility], [place] [address], [place], [place] [address]    Report Status PENDING  Incomplete  Culture, blood (routine x 2)     Status: None (Preliminary result)   Collection Time: 11/09/20 10:24 AM   Specimen: BLOOD RIGHT HAND  Result Value Ref Range Status   Specimen Description BLOOD RIGHT HAND  Final   Special Requests   Final    BOTTLES DRAWN AEROBIC ONLY Blood Culture adequate volume   Culture   Final    NO GROWTH < 24 HOURS Performed at [facility], [place] [address], [place],  [address]    Report Status PENDING  Incomplete    Studies/Results: CT CHEST ABDOMEN PELVIS W CONTRAST  Addendum Date: 11/10/2020   ADDENDUM REPORT: 11/10/2020 00:29 ADDENDUM: Additional impression point: New edematous changes and skin thickening across the mons pubis. Possibly related to some developing body wall edema though recommend correlation with direct visualization to exclude cellulitis. Addendum will be called to the ordering clinician or representative by the Radiologist Assistant, and communication documented in the PACS or [employer]. Electronically Signed   By: Lovena Le M.D.   On: 11/10/2020 00:29   Result Date: 11/10/2020 CLINICAL DATA:  Sepsis, history of COPD, hypertension, bioprosthetic aortic valve replacement, CAD and carcinoid lung cancer post lobectomy EXAM: CT CHEST, ABDOMEN, AND PELVIS WITH CONTRAST TECHNIQUE: Multidetector CT imaging of the chest, abdomen and pelvis was performed following the standard protocol during bolus administration of intravenous contrast. CONTRAST:  4mL OMNIPAQUE IOHEXOL 300  MG/ML  SOLN COMPARISON:  CT 11/05/2020 FINDINGS: CT CHEST FINDINGS Cardiovascular: Cardiac size is top normal. No sizable pericardial effusion, abnormal thickening or pericardial calcification. Coronary artery calcifications are present. Post TAVR. Mitral annular calcifications are seen. Prior sternotomy and CABG. Atherosclerotic plaque within the normal caliber aorta. Calcifications present in the proximal great vessels with shared origin of brachiocephalic and left common carotid artery. No acute abnormality the aorta or proximal great vessels. Central pulmonary arteries are normal caliber. No large central filling defects within the limitations of this non tailored examination of the pulmonary arteries. Mediastinum/Nodes: Postsurgical changes anterior mediastinum related to prior sternotomy and CABG. No mediastinal fluid or gas. Normal thyroid gland and thoracic inlet. No acute abnormality of the trachea or esophagus. No worrisome mediastinal, hilar or axillary adenopathy. Surgical material in the left hilum likely related prior lobectomy. Lungs/Pleura: Prior left lower lobectomy with relative hyperexpansion of the left upper lobe. Redemonstration of the scattered small pulmonary nodules in both lungs, largest in the left upper lobe measuring up to 7 mm in size (5/76). No new conspicuous or enlarging pulmonary nodules are seen. There are small bilateral pleural effusions with slightly lobular margins which could reflect some underlying loculation. Adjacent areas of passive atelectasis in both lung bases. Underlying  airspace disease is difficult to fully exclude. Some mild interlobular septal thickening is noted towards the lung apices. Mild pulmonary vascular redistribution is noted as well. Minimal airways thickening and scattered secretions. Musculoskeletal: Prior sternotomy with intact and aligned sternal sutures. No aggressive lytic or blastic lesions. Exaggerated thoracic kyphosis and mild dextrocurvature of  the spine. Multilevel degenerative changes are present in the imaged portions of the spine. Additional degenerative changes in the shoulders. CT ABDOMEN PELVIS FINDINGS Hepatobiliary: Hypoattenuation adjacent the falciform ligament likely reflecting some regions of focal fatty infiltration. More diffuse hepatic hypoattenuation likely reflecting more mild global hepatic steatosis. Smooth liver surface contour. No concerning focal liver lesion. Gallbladder appears surgically absent. Slight prominence of the biliary tree is similar to comparison imaging and likely reflect some post cholecystectomy reservoir effect. Pancreas: No pancreatic ductal dilatation or surrounding inflammatory changes. Spleen: Normal in size. No concerning splenic lesions. Adrenals/Urinary Tract: Normal adrenal glands. The kidneys enhance and excrete symmetrically. Redemonstration of an exophytic, lobular fluid attenuation cyst arising from the upper pole right kidney. Additional subcentimeter hypoattenuating foci are present in both kidneys too small to fully characterize on CT imaging but statistically likely benign. Bilateral extrarenal pelves are again seen. No concerning renal mass, obstructive urolithiasis or hydronephrosis. Stomach/Bowel: Small hiatal hernia. Stomach is otherwise unremarkable. The previously seen inflammatory changes centered upon the duodenum near the porta hepatis/gallbladder fossa have significantly diminished from the comparison exam. No extraluminal gas, organized abscess or collection is seen. Noninflamed small air-filled duodenal diverticulum near the ligament of Treitz (6/57). Normal sweep across the midline abdomen. No small bowel thickening or dilatation. No evidence of obstruction. No colonic dilatation or wall thickening. Scattered colonic diverticula without focal inflammation to suggest diverticulitis. Vascular/Lymphatic: Atherosclerotic calcifications within the abdominal aorta and branch vessels. No  aneurysm or ectasia. No enlarged abdominopelvic lymph nodes. Reproductive: Normal appearance of the uterus and adnexal structures. Other: Mild body wall edema most pronounced in the posterior midline and flanks. Some slightly more focal edematous change in skin thickening noted along the mons pubis, new from prior. Tiny fat containing umbilical hernia. No bowel containing hernias. No free air or fluid. Notable resolution of the previously seen perihepatic ascites. No organized abscess or collection. Musculoskeletal: No acute osseous abnormality or suspicious osseous lesion. Stable hemangioma L1. Multilevel degenerative changes are present in the imaged portions of the spine. Additional degenerative changes in the hips and pelvis. IMPRESSION: 1. The previously seen inflammatory changes centered upon the duodenum near the porta hepatis/gallbladder fossa have significantly diminished from the comparison exam. No extraluminal gas, organized abscess or collection is seen. 2. Small bilateral pleural effusions with slightly lobular margins which could reflect some underlying loculation. Adjacent areas of passive atelectasis in both lung bases. Underlying airspace disease is difficult to fully exclude. 3. Mild pulmonary vascular redistribution, suggestive of mild interstitial edema. 4. Stable appearance of the scattered small pulmonary nodules in both lungs, largest in the left upper lobe measuring up to 7 mm in size. 5. Colonic diverticulosis without evidence of diverticulitis. 6. Hepatic steatosis. 7. Coronary atherosclerosis with evidence of prior sternotomy, CABG and TAVR. 8. Aortic Atherosclerosis (ICD10-I70.0). Electronically Signed: By: Lovena Le M.D. On: 11/09/2020 23:13      Assessment/Plan:  INTERVAL HISTORY: CT of the chest is unrevealing.   Active Problems:   S/P AVR (aortic valve replacement)   Acute respiratory failure with hypoxia (HCC)   Metabolic acidosis   Elevated troponin   Atrial  fibrillation with RVR (HCC)   Severe sepsis  with septic shock ([place])   Malnutrition of moderate degree   Acute renal failure ([place])   Bacteremia    Tracy Buck is a 86 y.o. 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  CT scan shows possible duodenitis.  She has been narrowed from Zosyn to Unasyn.  2D echocardiogram showed crease gradient across the aortic valve as well as worsening tricuspid and mitral regurgitation  Repeat cultures are negative so far.  TEE is pending  She is not strong enough to stand to do a Panorex I will get a CT maxillofacial  She will need systemic IV antibiotics followed by protracted oral antibiotics  I spent greater than 35  minutes with the patient including greater than 50% of time in face to face counsel of the patient and her daughter regarding the nature of actinomyces work-up for endocarditis and in coordination of her care.   LOS: 5 days   Alcide Evener 11/10/2020, 12:31 PM

## 2020-11-10 NOTE — H&P (View-Only) (Signed)
Progress Note  Patient Name: Tracy Buck Date of Encounter: 11/10/2020  [facility] Cardiologist:  [facility] Dr. Bishop Limbo  In [facility]  Subjective   Overall feels ok but would like to sit up in the chair. Pursed lip breathing which she states is her habit for many years.  Inpatient Medications    Scheduled Meds: . apixaban  5 mg Oral BID  . aspirin EC  81 mg Oral Daily  . Chlorhexidine Gluconate Cloth  6 each Topical Daily  . feeding supplement  1 Container Oral TID BM  . fluticasone furoate-vilanterol  1 puff Inhalation Daily  . loratadine  10 mg Oral Daily  . melatonin  3 mg Oral QHS  . rosuvastatin  20 mg Oral Daily  . umeclidinium bromide  1 puff Inhalation Daily   Continuous Infusions: . sodium chloride Stopped (11/05/20 0847)  . ampicillin-sulbactam (UNASYN) IV 3 g (11/10/20 0534)   PRN Meds: sodium chloride, acetaminophen, docusate sodium, fentaNYL (SUBLIMAZE) injection, polyethylene glycol   Vital Signs    Vitals:   11/09/20 2339 11/10/20 0322 11/10/20 0331 11/10/20 0755  BP: 128/66 132/72    Pulse: (!) 106 (!) 111 99   Resp:  (!) 26    Temp: 98.2 F (36.8 C) 97.8 F (36.6 C)    TempSrc: Oral Oral    SpO2:    99%  Weight:      Height:        Intake/Output Summary (Last 24 hours) at 11/10/2020 0947 Last data filed at 11/10/2020 0321 Gross per 24 hour  Intake 540.07 ml  Output --  Net 540.07 ml   Last 3 Weights 11/07/2020 11/05/2020 11/05/2020  Weight (lbs) 172 lb 6.4 oz 172 lb 6.4 oz 168 lb 1.6 oz  Weight (kg) 78.2 kg 78.2 kg 76.25 kg      Telemetry    Atrial fibrillation with HR in the 110's - Personally Reviewed  ECG    No new  Physical Exam   GEN: No acute distress.   Neck: No JVD Cardiac: irregularly irregular, tachycardic. 2/6 SEM  Respiratory: Clear to auscultation bilaterally. GI: Soft, nontender, non-distended  MS: No edema; No deformity. Neuro:  Nonfocal  Psych: Normal affect   Labs    High Sensitivity Troponin:    Recent Labs  Lab 11/05/20 0650 11/05/20 1242 11/05/20 1508  TROPONINIHS 1,716* 1,693* 1,404*      Chemistry Recent Labs  Lab 11/05/20 1148 11/06/20 0137 11/07/20 0117 11/08/20 0159 11/09/20 0114 11/10/20 0215  NA 127* 132*  --   --  130*  --   K 3.1* 4.3  --   --  3.7  --   CL 101 102  --   --  100  --   CO2 18* 24  --   --  22  --   GLUCOSE 176* 121*  --   --  122*  --   BUN 15 12  --   --  9  --   CREATININE 1.20* 0.93  --   --  0.77  --   CALCIUM 7.8* 7.7*  --   --  7.8*  --   PROT 4.6* 4.9*   < > 5.3* 5.5* 5.4*  ALBUMIN 2.0* 2.0*   < > 2.0* 1.9*  1.9* 1.9*  AST 83* 58*   < > 23 21 34  ALT 52* 45*   < > 35 27 34  ALKPHOS 139* 130*   < > 100 93 86  BILITOT  2.8* 2.3*   < > 1.5* 1.3* 1.3*  GFRNONAA 45* >60  --   --  >60  --   ANIONGAP 8 6  --   --  8  --    < > = values in this interval not displayed.     Hematology Recent Labs  Lab 11/08/20 0159 11/09/20 0114 11/10/20 0215  WBC 13.3* 11.8* 11.2*  RBC 3.90 3.72* 3.64*  HGB 11.5* 11.2* 10.7*  HCT 34.2* 33.0* 32.6*  MCV 87.7 88.7 89.6  MCH 29.5 30.1 29.4  MCHC 33.6 33.9 32.8  RDW 14.9 14.9 14.7  PLT 170 170 185    BNP Recent Labs  Lab 11/05/20 0650  BNP 1,682.9*     DDimer No results for input(s): DDIMER in the last 168 hours.   Radiology    CT CHEST ABDOMEN PELVIS W CONTRAST  Addendum Date: 11/10/2020   ADDENDUM REPORT: 11/10/2020 00:29 ADDENDUM: Additional impression point: New edematous changes and skin thickening across the mons pubis. Possibly related to some developing body wall edema though recommend correlation with direct visualization to exclude cellulitis. Addendum will be called to the ordering clinician or representative by the Radiologist Assistant, and communication documented in the PACS or [employer]. Electronically Signed   By: Lovena Le M.D.   On: 11/10/2020 00:29   Result Date: 11/10/2020 CLINICAL DATA:  Sepsis, history of COPD, hypertension, bioprosthetic aortic valve  replacement, CAD and carcinoid lung cancer post lobectomy EXAM: CT CHEST, ABDOMEN, AND PELVIS WITH CONTRAST TECHNIQUE: Multidetector CT imaging of the chest, abdomen and pelvis was performed following the standard protocol during bolus administration of intravenous contrast. CONTRAST:  27mL OMNIPAQUE IOHEXOL 300 MG/ML  SOLN COMPARISON:  CT 11/05/2020 FINDINGS: CT CHEST FINDINGS Cardiovascular: Cardiac size is top normal. No sizable pericardial effusion, abnormal thickening or pericardial calcification. Coronary artery calcifications are present. Post TAVR. Mitral annular calcifications are seen. Prior sternotomy and CABG. Atherosclerotic plaque within the normal caliber aorta. Calcifications present in the proximal great vessels with shared origin of brachiocephalic and left common carotid artery. No acute abnormality the aorta or proximal great vessels. Central pulmonary arteries are normal caliber. No large central filling defects within the limitations of this non tailored examination of the pulmonary arteries. Mediastinum/Nodes: Postsurgical changes anterior mediastinum related to prior sternotomy and CABG. No mediastinal fluid or gas. Normal thyroid gland and thoracic inlet. No acute abnormality of the trachea or esophagus. No worrisome mediastinal, hilar or axillary adenopathy. Surgical material in the left hilum likely related prior lobectomy. Lungs/Pleura: Prior left lower lobectomy with relative hyperexpansion of the left upper lobe. Redemonstration of the scattered small pulmonary nodules in both lungs, largest in the left upper lobe measuring up to 7 mm in size (5/76). No new conspicuous or enlarging pulmonary nodules are seen. There are small bilateral pleural effusions with slightly lobular margins which could reflect some underlying loculation. Adjacent areas of passive atelectasis in both lung bases. Underlying airspace disease is difficult to fully exclude. Some mild interlobular septal thickening is  noted towards the lung apices. Mild pulmonary vascular redistribution is noted as well. Minimal airways thickening and scattered secretions. Musculoskeletal: Prior sternotomy with intact and aligned sternal sutures. No aggressive lytic or blastic lesions. Exaggerated thoracic kyphosis and mild dextrocurvature of the spine. Multilevel degenerative changes are present in the imaged portions of the spine. Additional degenerative changes in the shoulders. CT ABDOMEN PELVIS FINDINGS Hepatobiliary: Hypoattenuation adjacent the falciform ligament likely reflecting some regions of focal fatty infiltration. More diffuse hepatic  hypoattenuation likely reflecting more mild global hepatic steatosis. Smooth liver surface contour. No concerning focal liver lesion. Gallbladder appears surgically absent. Slight prominence of the biliary tree is similar to comparison imaging and likely reflect some post cholecystectomy reservoir effect. Pancreas: No pancreatic ductal dilatation or surrounding inflammatory changes. Spleen: Normal in size. No concerning splenic lesions. Adrenals/Urinary Tract: Normal adrenal glands. The kidneys enhance and excrete symmetrically. Redemonstration of an exophytic, lobular fluid attenuation cyst arising from the upper pole right kidney. Additional subcentimeter hypoattenuating foci are present in both kidneys too small to fully characterize on CT imaging but statistically likely benign. Bilateral extrarenal pelves are again seen. No concerning renal mass, obstructive urolithiasis or hydronephrosis. Stomach/Bowel: Small hiatal hernia. Stomach is otherwise unremarkable. The previously seen inflammatory changes centered upon the duodenum near the porta hepatis/gallbladder fossa have significantly diminished from the comparison exam. No extraluminal gas, organized abscess or collection is seen. Noninflamed small air-filled duodenal diverticulum near the ligament of Treitz (6/57). Normal sweep across the  midline abdomen. No small bowel thickening or dilatation. No evidence of obstruction. No colonic dilatation or wall thickening. Scattered colonic diverticula without focal inflammation to suggest diverticulitis. Vascular/Lymphatic: Atherosclerotic calcifications within the abdominal aorta and branch vessels. No aneurysm or ectasia. No enlarged abdominopelvic lymph nodes. Reproductive: Normal appearance of the uterus and adnexal structures. Other: Mild body wall edema most pronounced in the posterior midline and flanks. Some slightly more focal edematous change in skin thickening noted along the mons pubis, new from prior. Tiny fat containing umbilical hernia. No bowel containing hernias. No free air or fluid. Notable resolution of the previously seen perihepatic ascites. No organized abscess or collection. Musculoskeletal: No acute osseous abnormality or suspicious osseous lesion. Stable hemangioma L1. Multilevel degenerative changes are present in the imaged portions of the spine. Additional degenerative changes in the hips and pelvis. IMPRESSION: 1. The previously seen inflammatory changes centered upon the duodenum near the porta hepatis/gallbladder fossa have significantly diminished from the comparison exam. No extraluminal gas, organized abscess or collection is seen. 2. Small bilateral pleural effusions with slightly lobular margins which could reflect some underlying loculation. Adjacent areas of passive atelectasis in both lung bases. Underlying airspace disease is difficult to fully exclude. 3. Mild pulmonary vascular redistribution, suggestive of mild interstitial edema. 4. Stable appearance of the scattered small pulmonary nodules in both lungs, largest in the left upper lobe measuring up to 7 mm in size. 5. Colonic diverticulosis without evidence of diverticulitis. 6. Hepatic steatosis. 7. Coronary atherosclerosis with evidence of prior sternotomy, CABG and TAVR. 8. Aortic Atherosclerosis (ICD10-I70.0).  Electronically Signed: By: Lovena Le M.D. On: 11/09/2020 23:13    Cardiac Studies   Echo 11/05/2020 1. S/p TAVR (23 mm evolout pro). MG slightly higher across prosthesis  likely due to Afib with RVR and sepsis. MR is now moderate to severe. TR  is now moderate to severe. Would have low threshold for TEE if the patient  develops bacteremia with prosthetic  AVR and change in MR/TR. No definitive vegetation seen on this TTE.  2. 23 mm evolute pro (ViV procedure for prior 21 mm Magna ease) in the  aortic position. Vmax 2.8 m/s, MG 21 mmHG, EOA 1.18 cm2, DI 0.28. MG  slightly higher than what was reported at [facility] (15 mmHG 05/30/2020),  likely due to Afib and sepsis. The AT is  slightly prolonged ~114 msec. If bacteremia is present would recommend  TEE. The aortic valve has been repaired/replaced. Aortic valve  regurgitation is not visualized.  There is a 23 mm CoreValve-Evolut Pro  prosthetic (TAVR) valve present in the aortic  position. Procedure Date: 05/01/2020.  3. Left ventricular ejection fraction, by estimation, is 50 to 55%. The  left ventricle has low normal function. The left ventricle has no regional  wall motion abnormalities. There is mild concentric left ventricular  hypertrophy. Left ventricular  diastolic function could not be evaluated.  4. Right ventricular systolic function is moderately reduced. The right  ventricular size is mildly enlarged. There is moderately elevated  pulmonary artery systolic pressure. The estimated right ventricular  systolic pressure is 95.1 mmHg.  5. Right atrial size was mildly dilated.  6. The mitral valve is degenerative. Moderate to severe mitral valve  regurgitation. No evidence of mitral stenosis.  7. The tricuspid valve is abnormal. Tricuspid valve regurgitation is  moderate to severe.  8. The inferior vena cava is dilated in size with >50% respiratory  variability, suggesting right atrial pressure of 8 mmHg.   Patient  Profile     86 y.o. 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   Assessment & Plan    1. New onset afib RVR  - Started on IV Cardizem with improved HR then discontinued due to hypotension - HR seems slightly higher today. Consider starting low dose rate control. Will start with diltiazem 30 mg Q6hr, can decrease or change as needed for hypotension.  - LVEF low normal to 50-55% - continue Apixaban 5mg  BID (CHADS2VASC score 5). Would ideally consider DCCV with TEE tomorrow however with bacteremia and sepsis, will hold off. Consider outpatient DCCV if indicated in 3-4 weeks after [facility].   2. Elevated troponin  - not ACS pattern >>flat trend ([id number]) - she has never had CP and no focal WMA on echo - Likely demand ischemia, monitor for chest pain or HF symptoms.  3. Valvular heart dz - Echo with LVEF of 50-55% - S/p TAVR (23 mm evolout pro). MG 21 mmHG, EOA 1.18 cm2, DI 0.28. MG  slightly higher than what was reported at [facility] (15 mmHG 05/30/2020),  likely due to Afib and sepsis. - MR is now moderate to severe. TR  is now moderate to severe.  - The increased gradient across the valve is probably related to A. 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 Consent obtained by A. Duke PA, immediately before my visit today. See separate documentation.  For questions or updates, please contact [place] Please consult [email or web address] for contact info under        Signed, Elouise Munroe, MD  11/10/2020, 9:47 AM

## 2020-11-10 NOTE — Progress Notes (Signed)
PROGRESS NOTE    Tracy Buck  JIR:[id number] DOB: 07/25/1935 DOA: 11/05/2020 PCP: Drake Leach, MD   Brief Narrative:  This 86 year old female with history of COPD, hypertension, bioprosthetic aortic valve, 2021, CAD and lung cancer (carcinoid) s/p lobectomy. She was in her usual state of health until approximately 3/6 when she developed progressive fatigue. She has seen her PCP and cardiologist with these complaints without identifiable etiology. It was felt that perhaps polypharmacy was playing a role. Her PRN alprazolam was discontinued and Ambien dose was reduced from 10mg  to 5mg  QHS. On 3/16, early a.m.,  She has developed shortness of breath and chest tightness, presented to [place].  EKG was felt nonischemic, had atrial fibrillation with RVR.  Patient was started on diltiazem bolus and infusion.  Patient also had low-grade fever 100 F, leukocytosis, placed on empiric antibiotic and sepsis work-up was initiated. Chest x-ray showed left-sided effusion at her former lobectomy site, creatinine 1.3, lactic acid 8.7, troponin 1700, BNP 1682. Patient then became hypotensive and was admitted to ICU.  Severe sepsis was presumed to be due to duodenitis and antibiotics were narrowed, now on IV Zosyn, for 7 to 10 days.  Cardiology was also consulted for atrial fibrillation with RVR and abnormal echo changes (worsened MR, TAVR position slightly off).  Patient was transferred to [facility] service on 11/07/2020.  Blood cultures grew actinomyces neuii, infectious disease consulted.  Infectious disease recommended TEE to rule out infection endocarditis.  Patient is  scheduled to have TEE tomorrow.  Assessment & Plan:   Active Problems:   S/P AVR (aortic valve replacement)   Acute respiratory failure with hypoxia (HCC)   Metabolic acidosis   Elevated troponin   Atrial fibrillation with RVR (HCC)   Severe sepsis with septic shock (HCC)   Malnutrition of moderate degree   Acute renal failure  ([place])   Bacteremia   Severe sepsis presumed to be due to duodenitis, present on admission -Urine culture negative.  Blood culture positive for gram-positive rods, actinomyces neuii -CT renal stone study showed inflammatory changes surrounding the duodenum and adjacent to the head of the pancreas with extension into the region of porta hepatis, concerning for probable duodenitis. -Leukocytosis improving, afebrile -IV antibiotics narrowed to Zosyn (previously on IV vancomycin, cefepime and Flagyl), Continue for total of 7 to 10 days.  Can narrow to Augmentin closer to discharge. -Advance diet to solids -Infectious disease consulted recommended TEE to rule out infective endocarditis.   Active Problems: Atrial fibrillation with RVR, New onset -Patient was placed on IV Cardizem drip, heart rate improved however patient developed hypotension -Cardiology consulted, heart rate now increased, started Cardizem 30 mg every 6hrs -Patient was placed on IV heparin, now transitioned to apixaban -Elevated troponin likely due to demand ischemia, atrial fibrillation with RVR, not ACS -2D echo with LVEF of 50 to 55% -Heart rate is controlled.  Patient is currently on Eliquis.  Abnormal EKG changes, worsened MR, TR, TAVR position change -Cardiology following, MR is now moderate to severe, TR is now moderate to severe -Blood cultures grew actinomyces neuii, infectious disease recommended TEE. -Patient is scheduled to have TEE on 3/22.  Acute kidney injury with metabolic acidosis, lactic acidosis > Resolved. -Baseline creatinine 0.9-1.05 -Presented with creatinine of 1.3, lactic acid 8.7 on admission -Patient was placed on bicarb drip, lactic acid improved to 2.3, bicarb normal 24, creatinine 0.9 -Diet advanced to solids today Acute kidney injury has resolved.  Metabolic acidosis has resolved.  Last CO2 was 24.  COPD -Currently stable, no wheezing, continue outpatient inhalers.  Hypertension -BP  currently stable, holding antihypertensives.  Hyperlipidemia Continue statin  Generalized weakness: Patient reports progressive fatigue, dizziness, generalized debility. Patient was seen by her PCP, cardiologist, no identifiable etiology found.   Perhaps polypharmacy could be the reason.  Xanax was discontinued.   Ambien dose decreased from 10 mg to 5 mg, now discontinued -Continue melatonin -Would not resume Xanax outpatient, discussed with patient's daughter -Continue PT OT, mobilization, very deconditioned.  Malnutrition of moderate degree Very deconditioned, diet advanced , albumin 2.0 Estimated body mass index is 30.54 kg/m as calculated from the following:   Height as of this encounter: 5\' 3"  (1.6 m).   Weight as of this encounter: 78.2 kg.   DVT prophylaxis: Heparin  Code Status: Full code. Family Communication:  Daughter at bed side. Disposition Plan:   Status is: Inpatient  Remains inpatient appropriate because:Inpatient level of care appropriate due to severity of illness   Dispo: The patient is from: Home              Anticipated d/c is to: Home              Patient currently is not medically stable to d/c.   Difficult to place patient No  Consultants:   Cardiology  Infectious disease.  Procedures: TTE, scheduled TEE Antimicrobials:   Anti-infectives (From admission, onward)   Start     Dose/Rate Route Frequency Ordered Stop   11/09/20 1130  Ampicillin-Sulbactam (UNASYN) 3 g in sodium chloride 0.9 % 100 mL IVPB        3 g 200 mL/hr over 30 Minutes Intravenous Every 6 hours 11/09/20 1025     11/06/20 0600  vancomycin (VANCOREADY) IVPB 750 mg/150 mL  Status:  Discontinued        750 mg 150 mL/hr over 60 Minutes Intravenous Every 24 hours 11/05/20 0757 11/06/20 0745   11/05/20 2000  ceFEPIme (MAXIPIME) 2 g in sodium chloride 0.9 % 100 mL IVPB  Status:  Discontinued        2 g 200 mL/hr over 30 Minutes Intravenous Every 12 hours 11/05/20 0757 11/05/20  1704   11/05/20 1745  piperacillin-tazobactam (ZOSYN) IVPB 3.375 g  Status:  Discontinued        3.375 g 12.5 mL/hr over 240 Minutes Intravenous Every 8 hours 11/05/20 1720 11/09/20 1015   11/05/20 0730  ceFEPIme (MAXIPIME) 2 g in sodium chloride 0.9 % 100 mL IVPB        2 g 200 mL/hr over 30 Minutes Intravenous  Once 11/05/20 0716 11/05/20 0814   11/05/20 0730  metroNIDAZOLE (FLAGYL) IVPB 500 mg        500 mg 100 mL/hr over 60 Minutes Intravenous  Once 11/05/20 0716 11/05/20 0850   11/05/20 0730  vancomycin (VANCOCIN) IVPB 1000 mg/200 mL premix        1,000 mg 200 mL/hr over 60 Minutes Intravenous  Once 11/05/20 0716 11/05/20 0914      Subjective: Patient was seen and examined at bedside.  Denies any overnight events.  Patient has pursed lip breathing,  states this is chronic for years.  She reports having shortness of breath on exertion.  She is scheduled to have TEE tomorrow.  She also reports having chronic loose watery stools.  Objective: Vitals:   11/10/20 0322 11/10/20 0331 11/10/20 0755 11/10/20 1151  BP: 132/72   121/80  Pulse: (!) 111 99  98  Resp: (!) 26   16  Temp: 97.8 F (36.6 C)     TempSrc: Oral     SpO2:   99% 95%  Weight:      Height:        Intake/Output Summary (Last 24 hours) at 11/10/2020 1219 Last data filed at 11/10/2020 0321 Gross per 24 hour  Intake 258.25 ml  Output --  Net 258.25 ml   Filed Weights   11/05/20 0707 11/05/20 1002 11/07/20 0500  Weight: 76.2 kg 78.2 kg 78.2 kg    Examination:  General exam: Appears calm and comfortable. Not in any acute distress. Respiratory system: Clear to auscultation. Respiratory effort normal. Cardiovascular system: S1 & S2 heard, RRR. No JVD, murmurs, rubs, gallops or clicks. No pedal edema. Gastrointestinal system: Abdomen is nondistended, soft and nontender.  No organomegaly or masses felt. Normal bowel sounds heard. Central nervous system: Alert and oriented. No focal neurological  deficits. Extremities: Symmetric 5 x 5 power.  No edema, no cyanosis, no clubbing. Skin: No rashes, lesions or ulcers Psychiatry: Judgement and insight appear normal. Mood & affect appropriate.     Data Reviewed: I have personally reviewed following labs and imaging studies  CBC: Recent Labs  Lab 11/05/20 0649 11/05/20 0730 11/05/20 1148 11/06/20 0137 11/07/20 0117 11/08/20 0159 11/09/20 0114 11/10/20 0215  WBC 20.5*  --  26.2* 19.1* 14.5* 13.3* 11.8* 11.2*  NEUTROABS 17.5*  --  25.4*  --   --   --   --   --   HGB 12.7   < > 10.7* 10.8* 11.3* 11.5* 11.2* 10.7*  HCT 38.6   < > 30.9* 30.6* 32.1* 34.2* 33.0* 32.6*  MCV 90.8  --  87.0 86.0 86.5 87.7 88.7 89.6  PLT 161  --  108* 138* 160 170 170 185   < > = values in this interval not displayed.   Basic Metabolic Panel: Recent Labs  Lab 11/05/20 0649 11/05/20 0730 11/05/20 1148 11/06/20 0137 11/09/20 0114 11/10/20 1019  NA 129* 131* 127* 132* 130* 129*  K 3.7 3.3* 3.1* 4.3 3.7 3.6  CL 99  --  101 102 100 102  CO2 11*  --  18* 24 22 21*  GLUCOSE 278*  --  176* 121* 122* 231*  BUN 16  --  15 12 9  6*  CREATININE 1.33*  --  1.20* 0.93 0.77 0.82  CALCIUM 8.2*  --  7.8* 7.7* 7.8* 7.7*  MG  --   --  1.7 2.2 1.9  --   PHOS  --   --  2.5 3.5 3.3  --    GFR: Estimated Creatinine Clearance: 51.5 mL/min (by C-G formula based on SCr of 0.82 mg/dL). Liver Function Tests: Recent Labs  Lab 11/06/20 0137 11/07/20 0117 11/08/20 0159 11/09/20 0114 11/10/20 0215  AST 58* 43* 23 21 34  ALT 45* 50* 35 27 34  ALKPHOS 130* 110 100 93 86  BILITOT 2.3* 1.7* 1.5* 1.3* 1.3*  PROT 4.9* 5.0* 5.3* 5.5* 5.4*  ALBUMIN 2.0* 2.0* 2.0* 1.9*  1.9* 1.9*   Recent Labs  Lab 11/05/20 1823  LIPASE 25   No results for input(s): AMMONIA in the last 168 hours. Coagulation Profile: Recent Labs  Lab 11/05/20 0726  INR 1.3*   Cardiac Enzymes: No results for input(s): CKTOTAL, CKMB, CKMBINDEX, TROPONINI in the last 168 hours. BNP (last 3  results) No results for input(s): PROBNP in the last 8760 hours. HbA1C: No results for input(s): HGBA1C in the last 72 hours. CBG: Recent Labs  Lab  11/05/20 1155 11/05/20 1600 11/05/20 1936 11/06/20 0333  GLUCAP 179* 164* 133* 107*   Lipid Profile: No results for input(s): CHOL, HDL, LDLCALC, TRIG, CHOLHDL, LDLDIRECT in the last 72 hours. Thyroid Function Tests: No results for input(s): TSH, T4TOTAL, FREET4, T3FREE, THYROIDAB in the last 72 hours. Anemia Panel: No results for input(s): VITAMINB12, FOLATE, FERRITIN, TIBC, IRON, RETICCTPCT in the last 72 hours. Sepsis Labs: Recent Labs  Lab 11/05/20 0650 11/05/20 1148 11/05/20 1508  PROCALCITON  --  68.77  --   LATICACIDVEN 8.7* 2.2* 2.3*    Recent Results (from the past 240 hour(s))  Resp Panel by RT-PCR (Flu A&B, Covid) Nasopharyngeal Swab     Status: None   Collection Time: 11/05/20  7:19 AM   Specimen: Nasopharyngeal Swab; Nasopharyngeal(NP) swabs in vial transport medium  Result Value Ref Range Status   SARS Coronavirus 2 by RT PCR NEGATIVE NEGATIVE Final    Comment: (NOTE) SARS-CoV-2 target nucleic acids are NOT DETECTED.  The SARS-CoV-2 RNA is generally detectable in upper respiratory specimens during the acute phase of infection. The lowest concentration of SARS-CoV-2 viral copies this assay can detect is 138 copies/mL. A negative result does not preclude SARS-Cov-2 infection and should not be used as the sole basis for treatment or other patient management decisions. A negative result may occur with  improper specimen collection/handling, submission of specimen other than nasopharyngeal swab, presence of viral mutation(s) within the areas targeted by this assay, and inadequate number of viral copies(<138 copies/mL). A negative result must be combined with clinical observations, patient history, and epidemiological information. The expected result is Negative.  Fact Sheet for Patients:   EntrepreneurPulse.com.au  Fact Sheet for Healthcare Providers:  IncredibleEmployment.be  This test is no t yet approved or cleared by the [place] FDA and  has been authorized for detection and/or diagnosis of SARS-CoV-2 by FDA under an Emergency Use Authorization (EUA). This EUA will remain  in effect (meaning this test can be used) for the duration of the COVID-19 declaration under Section 564(b)(1) of the Act, 21 U.S.C.section 360bbb-3(b)(1), unless the authorization is terminated  or revoked sooner.       Influenza A by PCR NEGATIVE NEGATIVE Final   Influenza B by PCR NEGATIVE NEGATIVE Final    Comment: (NOTE) The Xpert Xpress SARS-CoV-2/FLU/RSV plus assay is intended as an aid in the diagnosis of influenza from Nasopharyngeal swab specimens and should not be used as a sole basis for treatment. Nasal washings and aspirates are unacceptable for Xpert Xpress SARS-CoV-2/FLU/RSV testing.  Fact Sheet for Patients: EntrepreneurPulse.com.au  Fact Sheet for Healthcare Providers: IncredibleEmployment.be  This test is not yet approved or cleared by the [place] FDA and has been authorized for detection and/or diagnosis of SARS-CoV-2 by FDA under an Emergency Use Authorization (EUA). This EUA will remain in effect (meaning this test can be used) for the duration of the COVID-19 declaration under Section 564(b)(1) of the Act, 21 U.S.C. section 360bbb-3(b)(1), unless the authorization is terminated or revoked.  Performed at [facility], [address] [place], [place] [address]   Urine culture     Status: None   Collection Time: 11/05/20  7:26 AM   Specimen: In/Out Cath Urine  Result Value Ref Range Status   Specimen Description   Final    IN/OUT CATH URINE Performed at [facility], [address] [place], [place] [address]    Special Requests   Final    NONE Performed  at [facility], [address] [place], [place] [address]    Culture   Final    NO GROWTH Performed at [facility], [place] [address], [place], [place] [address]    Report Status 11/06/2020 FINAL  Final  Blood Culture (routine x 2)     Status: Abnormal   Collection Time: 11/05/20  7:27 AM   Specimen: BLOOD RIGHT HAND  Result Value Ref Range Status   Specimen Description   Final    BLOOD RIGHT HAND Performed at [facility], [address] [place], [place] [address]    Special Requests   Final    BOTTLES DRAWN AEROBIC AND ANAEROBIC Blood Culture adequate volume Performed at [facility], [address] [place], [place] [address]    Culture  Setup Time   Final    GRAM POSITIVE RODS IN BOTH AEROBIC AND ANAEROBIC BOTTLES CRITICAL RESULT CALLED TO, READ BACK BY AND VERIFIED WITH: PHARMD HALEY B. 1936 [id number] FCP    Culture (A)  Final    ACTINOMYCES NEUII Standardized susceptibility testing for this organism is not available. Performed at [facility], [place] [address], [place], [place] [address]    Report Status 11/09/2020 FINAL  Final  MRSA PCR Screening     Status: None   Collection Time: 11/05/20 11:19 AM   Specimen: Nasal Mucosa; Nasopharyngeal  Result Value Ref Range Status   MRSA by PCR NEGATIVE NEGATIVE Final    Comment:        The GeneXpert MRSA Assay (FDA approved for NASAL specimens only), is one component of a comprehensive MRSA colonization surveillance program. It is not intended to diagnose MRSA infection nor to guide or monitor treatment for MRSA infections. Performed at [facility], [place] [address] [place], [place] [address]   Culture, blood (routine x 2)     Status: None (Preliminary result)   Collection Time: 11/09/20 10:16 AM   Specimen: BLOOD LEFT HAND  Result Value Ref Range Status   Specimen Description BLOOD LEFT HAND  Final   Special Requests   Final    BOTTLES DRAWN AEROBIC ONLY Blood Culture  results may not be optimal due to an inadequate volume of blood received in culture bottles   Culture   Final    NO GROWTH < 24 HOURS Performed at [facility], [place] [address] [place], [place] [address]    Report Status PENDING  Incomplete  Culture, blood (routine x 2)     Status: None (Preliminary result)   Collection Time: 11/09/20 10:24 AM   Specimen: BLOOD RIGHT HAND  Result Value Ref Range Status   Specimen Description BLOOD RIGHT HAND  Final   Special Requests   Final    BOTTLES DRAWN AEROBIC ONLY Blood Culture adequate volume   Culture   Final    NO GROWTH < 24 HOURS Performed at [facility], [place] [address], [place], [place] [address]    Report Status PENDING  Incomplete    Radiology Studies: CT CHEST ABDOMEN PELVIS W CONTRAST  Addendum Date: 11/10/2020   ADDENDUM REPORT: 11/10/2020 00:29 ADDENDUM: Additional impression point: New edematous changes and skin thickening across the mons pubis. Possibly related to some developing body wall edema though recommend correlation with direct visualization to exclude cellulitis. Addendum will be called to the ordering clinician or representative by the Radiologist Assistant, and communication documented in the PACS or [employer].  Electronically Signed   By: Lovena Le M.D.   On: 11/10/2020 00:29   Result Date: 11/10/2020 CLINICAL DATA:  Sepsis, history of COPD, hypertension, bioprosthetic aortic valve replacement, CAD and carcinoid lung cancer post lobectomy EXAM: CT CHEST, ABDOMEN, AND PELVIS WITH CONTRAST TECHNIQUE: Multidetector CT imaging of the chest, abdomen and pelvis was performed following the standard protocol during bolus administration of intravenous contrast. CONTRAST:  13mL OMNIPAQUE IOHEXOL 300 MG/ML  SOLN COMPARISON:  CT 11/05/2020 FINDINGS: CT CHEST FINDINGS Cardiovascular: Cardiac size is top normal. No sizable pericardial effusion, abnormal thickening or pericardial calcification. Coronary artery  calcifications are present. Post TAVR. Mitral annular calcifications are seen. Prior sternotomy and CABG. Atherosclerotic plaque within the normal caliber aorta. Calcifications present in the proximal great vessels with shared origin of brachiocephalic and left common carotid artery. No acute abnormality the aorta or proximal great vessels. Central pulmonary arteries are normal caliber. No large central filling defects within the limitations of this non tailored examination of the pulmonary arteries. Mediastinum/Nodes: Postsurgical changes anterior mediastinum related to prior sternotomy and CABG. No mediastinal fluid or gas. Normal thyroid gland and thoracic inlet. No acute abnormality of the trachea or esophagus. No worrisome mediastinal, hilar or axillary adenopathy. Surgical material in the left hilum likely related prior lobectomy. Lungs/Pleura: Prior left lower lobectomy with relative hyperexpansion of the left upper lobe. Redemonstration of the scattered small pulmonary nodules in both lungs, largest in the left upper lobe measuring up to 7 mm in size (5/76). No new conspicuous or enlarging pulmonary nodules are seen. There are small bilateral pleural effusions with slightly lobular margins which could reflect some underlying loculation. Adjacent areas of passive atelectasis in both lung bases. Underlying airspace disease is difficult to fully exclude. Some mild interlobular septal thickening is noted towards the lung apices. Mild pulmonary vascular redistribution is noted as well. Minimal airways thickening and scattered secretions. Musculoskeletal: Prior sternotomy with intact and aligned sternal sutures. No aggressive lytic or blastic lesions. Exaggerated thoracic kyphosis and mild dextrocurvature of the spine. Multilevel degenerative changes are present in the imaged portions of the spine. Additional degenerative changes in the shoulders. CT ABDOMEN PELVIS FINDINGS Hepatobiliary: Hypoattenuation adjacent  the falciform ligament likely reflecting some regions of focal fatty infiltration. More diffuse hepatic hypoattenuation likely reflecting more mild global hepatic steatosis. Smooth liver surface contour. No concerning focal liver lesion. Gallbladder appears surgically absent. Slight prominence of the biliary tree is similar to comparison imaging and likely reflect some post cholecystectomy reservoir effect. Pancreas: No pancreatic ductal dilatation or surrounding inflammatory changes. Spleen: Normal in size. No concerning splenic lesions. Adrenals/Urinary Tract: Normal adrenal glands. The kidneys enhance and excrete symmetrically. Redemonstration of an exophytic, lobular fluid attenuation cyst arising from the upper pole right kidney. Additional subcentimeter hypoattenuating foci are present in both kidneys too small to fully characterize on CT imaging but statistically likely benign. Bilateral extrarenal pelves are again seen. No concerning renal mass, obstructive urolithiasis or hydronephrosis. Stomach/Bowel: Small hiatal hernia. Stomach is otherwise unremarkable. The previously seen inflammatory changes centered upon the duodenum near the porta hepatis/gallbladder fossa have significantly diminished from the comparison exam. No extraluminal gas, organized abscess or collection is seen. Noninflamed small air-filled duodenal diverticulum near the ligament of Treitz (6/57). Normal sweep across the midline abdomen. No small bowel thickening or dilatation. No evidence of obstruction. No colonic dilatation or wall thickening. Scattered colonic diverticula without focal inflammation to suggest diverticulitis. Vascular/Lymphatic: Atherosclerotic calcifications within the abdominal aorta and branch vessels. No aneurysm or ectasia.  No enlarged abdominopelvic lymph nodes. Reproductive: Normal appearance of the uterus and adnexal structures. Other: Mild body wall edema most pronounced in the posterior midline and flanks.  Some slightly more focal edematous change in skin thickening noted along the mons pubis, new from prior. Tiny fat containing umbilical hernia. No bowel containing hernias. No free air or fluid. Notable resolution of the previously seen perihepatic ascites. No organized abscess or collection. Musculoskeletal: No acute osseous abnormality or suspicious osseous lesion. Stable hemangioma L1. Multilevel degenerative changes are present in the imaged portions of the spine. Additional degenerative changes in the hips and pelvis. IMPRESSION: 1. The previously seen inflammatory changes centered upon the duodenum near the porta hepatis/gallbladder fossa have significantly diminished from the comparison exam. No extraluminal gas, organized abscess or collection is seen. 2. Small bilateral pleural effusions with slightly lobular margins which could reflect some underlying loculation. Adjacent areas of passive atelectasis in both lung bases. Underlying airspace disease is difficult to fully exclude. 3. Mild pulmonary vascular redistribution, suggestive of mild interstitial edema. 4. Stable appearance of the scattered small pulmonary nodules in both lungs, largest in the left upper lobe measuring up to 7 mm in size. 5. Colonic diverticulosis without evidence of diverticulitis. 6. Hepatic steatosis. 7. Coronary atherosclerosis with evidence of prior sternotomy, CABG and TAVR. 8. Aortic Atherosclerosis (ICD10-I70.0). Electronically Signed: By: Lovena Le M.D. On: 11/09/2020 23:13   Scheduled Meds: . apixaban  5 mg Oral BID  . aspirin EC  81 mg Oral Daily  . Chlorhexidine Gluconate Cloth  6 each Topical Daily  . diltiazem  30 mg Oral Q6H  . feeding supplement  1 Container Oral TID BM  . fluticasone furoate-vilanterol  1 puff Inhalation Daily  . loratadine  10 mg Oral Daily  . melatonin  3 mg Oral QHS  . rosuvastatin  20 mg Oral Daily  . umeclidinium bromide  1 puff Inhalation Daily   Continuous Infusions: . sodium  chloride Stopped (11/05/20 0847)  . ampicillin-sulbactam (UNASYN) IV 3 g (11/10/20 1123)     LOS: 5 days    Time spent: 35 mins    Lakrista Scaduto, MD [facility]   If 7PM-7AM, please contact night-coverage

## 2020-11-10 NOTE — Care Management Important Message (Signed)
Important Message  Patient Details  Name: Tracy Buck MRN: [id number] Date of Birth: 12-16-1934   Medicare Important Message Given:  Yes     Orbie Pyo 11/10/2020, 3:24 PM

## 2020-11-10 NOTE — Progress Notes (Signed)
    [facility] has been requested to perform a transesophageal echocardiogram on Tracy Buck for bacteremia.  After careful review of history and examination, the risks and benefits of transesophageal echocardiogram have been explained including risks of esophageal damage, perforation (1:10,000 risk), bleeding, pharyngeal hematoma as well as other potential complications associated with conscious sedation including aspiration, arrhythmia, respiratory failure and death. Alternatives to treatment were discussed, questions were answered. Patient is willing to proceed.   PT is scheduled for TEE tomorrow 11/11/20 at 0800 with Dr. Oval Linsey. NPO at MN please.  Tracy Buck, [place]  11/10/2020 9:28 AM

## 2020-11-11 ENCOUNTER — Inpatient Hospital Stay (HOSPITAL_COMMUNITY): Payer: Medicare Other | Admitting: Anesthesiology

## 2020-11-11 ENCOUNTER — Inpatient Hospital Stay (HOSPITAL_COMMUNITY): Payer: Medicare Other

## 2020-11-11 ENCOUNTER — Encounter (HOSPITAL_COMMUNITY)
Admission: EM | Disposition: A | Discharge status: Home Health | Payer: Self-pay | Source: Home / Self Care | Attending: Family Medicine

## 2020-11-11 ENCOUNTER — Encounter (HOSPITAL_COMMUNITY): Payer: Self-pay | Admitting: Internal Medicine

## 2020-11-11 ENCOUNTER — Inpatient Hospital Stay: Payer: Self-pay

## 2020-11-11 DIAGNOSIS — N179 Acute kidney failure, unspecified: Secondary | ICD-10-CM

## 2020-11-11 DIAGNOSIS — I482 Chronic atrial fibrillation, unspecified: Secondary | ICD-10-CM | POA: Diagnosis not present

## 2020-11-11 DIAGNOSIS — A429 Actinomycosis, unspecified: Secondary | ICD-10-CM | POA: Diagnosis not present

## 2020-11-11 DIAGNOSIS — I340 Nonrheumatic mitral (valve) insufficiency: Secondary | ICD-10-CM | POA: Diagnosis not present

## 2020-11-11 DIAGNOSIS — Q211 Atrial septal defect: Secondary | ICD-10-CM | POA: Diagnosis not present

## 2020-11-11 DIAGNOSIS — R7881 Bacteremia: Secondary | ICD-10-CM | POA: Diagnosis not present

## 2020-11-11 DIAGNOSIS — J9601 Acute respiratory failure with hypoxia: Secondary | ICD-10-CM | POA: Diagnosis not present

## 2020-11-11 DIAGNOSIS — A419 Sepsis, unspecified organism: Secondary | ICD-10-CM | POA: Diagnosis not present

## 2020-11-11 DIAGNOSIS — R6520 Severe sepsis without septic shock: Secondary | ICD-10-CM | POA: Diagnosis not present

## 2020-11-11 HISTORY — PX: TEE WITHOUT CARDIOVERSION: SHX5443

## 2020-11-11 LAB — ECHO TEE
AR max vel: 1.18 cm2
AV Area VTI: 1.25 cm2
AV Area mean vel: 1.34 cm2
AV Mean grad: 8 mmHg
AV Peak grad: 16.2 mmHg
Ao pk vel: 2.01 m/s
MV M vel: 6.01 m/s
MV Peak grad: 144.5 mmHg
Radius: 0.5 cm

## 2020-11-11 LAB — BASIC METABOLIC PANEL
Anion gap: 6 (ref 5–15)
BUN: 7 mg/dL — ABNORMAL LOW (ref 8–23)
CO2: 22 mmol/L (ref 22–32)
Calcium: 8.1 mg/dL — ABNORMAL LOW (ref 8.9–10.3)
Chloride: 104 mmol/L (ref 98–111)
Creatinine, Ser: 0.75 mg/dL (ref 0.44–1.00)
GFR, Estimated: >60 mL/min (ref >60)
Glucose, Bld: 151 mg/dL — ABNORMAL HIGH (ref 70–99)
Potassium: 3.8 mmol/L (ref 3.5–5.1)
Sodium: 132 mmol/L — ABNORMAL LOW (ref 135–145)

## 2020-11-11 LAB — BLOOD GAS, ARTERIAL
Acid-base deficit: 2.7 mmol/L — ABNORMAL HIGH (ref 0.0–2.0)
Bicarbonate: 22.1 mmol/L (ref 20.0–28.0)
Drawn by: 519031
FIO2: 32
O2 Saturation: 95.4 %
Patient temperature: 36.9
pCO2 arterial: 41.3 mmHg (ref 32.0–48.0)
pH, Arterial: 7.347 — ABNORMAL LOW (ref 7.350–7.450)
pO2, Arterial: 83.3 mmHg (ref 83.0–108.0)

## 2020-11-11 LAB — PHOSPHORUS: Phosphorus: 3.6 mg/dL (ref 2.5–4.6)

## 2020-11-11 LAB — CBC
HCT: 35.7 % — ABNORMAL LOW (ref 36.0–46.0)
Hemoglobin: 11.6 g/dL — ABNORMAL LOW (ref 12.0–15.0)
MCH: 29.6 pg (ref 26.0–34.0)
MCHC: 32.5 g/dL (ref 30.0–36.0)
MCV: 91.1 fL (ref 80.0–100.0)
Platelets: 209 10*3/uL (ref 150–400)
RBC: 3.92 MIL/uL (ref 3.87–5.11)
RDW: 14.9 % (ref 11.5–15.5)
WBC: 13.5 10*3/uL — ABNORMAL HIGH (ref 4.0–10.5)
nRBC: 0 % (ref 0.0–0.2)

## 2020-11-11 LAB — BODY FLUID CELL COUNT WITH DIFFERENTIAL
Eos, Fluid: 0 %
Lymphs, Fluid: 44 %
Monocyte-Macrophage-Serous Fluid: 12 % — ABNORMAL LOW (ref 50–90)
Neutrophil Count, Fluid: 44 % — ABNORMAL HIGH (ref 0–25)
Total Nucleated Cell Count, Fluid: 293 cu mm (ref 0–1000)

## 2020-11-11 LAB — PROTEIN, PLEURAL OR PERITONEAL FLUID: Total protein, fluid: <3 g/dL

## 2020-11-11 LAB — LACTATE DEHYDROGENASE, PLEURAL OR PERITONEAL FLUID: LD, Fluid: 57 U/L — ABNORMAL HIGH (ref 3–23)

## 2020-11-11 LAB — MAGNESIUM: Magnesium: 2 mg/dL (ref 1.7–2.4)

## 2020-11-11 SURGERY — ECHOCARDIOGRAM, TRANSESOPHAGEAL
Anesthesia: Monitor Anesthesia Care

## 2020-11-11 MED ORDER — IOHEXOL 300 MG/ML  SOLN
75.0000 mL | Freq: Once | INTRAMUSCULAR | Status: AC | PRN
  Administered 2020-11-11: 75 mL via INTRAVENOUS

## 2020-11-11 MED ORDER — DEXMEDETOMIDINE (PRECEDEX) IN NS 20 MCG/5ML (4 MCG/ML) IV SYRINGE
PREFILLED_SYRINGE | INTRAVENOUS | Status: DC | PRN
  Administered 2020-11-11 (×2): 8 ug via INTRAVENOUS
  Administered 2020-11-11: 4 ug via INTRAVENOUS

## 2020-11-11 MED ORDER — PROPOFOL 10 MG/ML IV BOLUS
INTRAVENOUS | Status: DC | PRN
  Administered 2020-11-11: 20 mg via INTRAVENOUS
  Administered 2020-11-11: 10 mg via INTRAVENOUS
  Administered 2020-11-11: 20 mg via INTRAVENOUS

## 2020-11-11 MED ORDER — DIPHENHYDRAMINE HCL 25 MG PO CAPS
25.0000 mg | ORAL_CAPSULE | Freq: Every evening | ORAL | Status: DC | PRN
  Administered 2020-11-11 – 2020-11-16 (×5): 25 mg via ORAL
  Filled 2020-11-11 (×5): qty 1

## 2020-11-11 MED ORDER — SODIUM CHLORIDE 0.9 % IV SOLN
INTRAVENOUS | Status: AC | PRN
  Administered 2020-11-11: 500 mL via INTRAVENOUS

## 2020-11-11 MED ORDER — PENICILLIN G POTASSIUM 20000000 UNITS IJ SOLR
12.0000 10*6.[IU] | Freq: Two times a day (BID) | INTRAMUSCULAR | Status: DC
  Administered 2020-11-11 – 2020-11-16 (×12): 12 10*6.[IU] via INTRAVENOUS
  Filled 2020-11-11 (×14): qty 12

## 2020-11-11 MED ORDER — BUTAMBEN-TETRACAINE-BENZOCAINE 2-2-14 % EX AERO
INHALATION_SPRAY | CUTANEOUS | Status: DC | PRN
  Administered 2020-11-11: 2 via TOPICAL

## 2020-11-11 MED ORDER — HALOPERIDOL LACTATE 5 MG/ML IJ SOLN
2.0000 mg | Freq: Four times a day (QID) | INTRAMUSCULAR | Status: DC | PRN
  Administered 2020-11-11: 0.4 mg via INTRAMUSCULAR
  Administered 2020-11-11 – 2020-11-13 (×2): 2 mg via INTRAMUSCULAR
  Filled 2020-11-11 (×3): qty 1

## 2020-11-11 MED ORDER — LORAZEPAM 2 MG/ML IJ SOLN
0.5000 mg | Freq: Once | INTRAMUSCULAR | Status: AC
  Administered 2020-11-11: 0.5 mg via INTRAVENOUS
  Filled 2020-11-11: qty 1

## 2020-11-11 MED ORDER — PROPOFOL 500 MG/50ML IV EMUL
INTRAVENOUS | Status: DC | PRN
  Administered 2020-11-11: 25 ug/kg/min via INTRAVENOUS

## 2020-11-11 MED ORDER — PHENYLEPHRINE HCL (PRESSORS) 10 MG/ML IV SOLN
INTRAVENOUS | Status: DC | PRN
  Administered 2020-11-11 (×3): 80 ug via INTRAVENOUS

## 2020-11-11 MED ORDER — NYSTATIN 100000 UNIT/ML MT SUSP
5.0000 mL | Freq: Four times a day (QID) | OROMUCOSAL | Status: DC
  Administered 2020-11-11 – 2020-11-17 (×24): 500000 [IU] via ORAL
  Filled 2020-11-11 (×22): qty 5

## 2020-11-11 MED ORDER — FUROSEMIDE 10 MG/ML IJ SOLN
60.0000 mg | Freq: Three times a day (TID) | INTRAMUSCULAR | Status: AC
  Administered 2020-11-11 – 2020-11-12 (×2): 60 mg via INTRAVENOUS
  Filled 2020-11-11 (×2): qty 6

## 2020-11-11 NOTE — Progress Notes (Addendum)
NAME:  Tracy Buck, MRN:  [id number], DOB:  [birth year], LOS: 6 ADMISSION DATE:  11/05/2020, CONSULTATION DATE:  3/16 REFERRING MD:  Dr Billy Fischer EDP, CHIEF COMPLAINT:  Sepsis  Brief History:  86 year old female admitted 3/16 with concerns of sepsis with unclear source and requiring BiPAP for hypoxia while at [facility]. Transferred to [facility] on empiric antibiotics. She was felt to have sepsis secondary to duodenitis after imaging was done. Hypotensive but resolved spontaneously resolved. Dyspnea improved with BiPAP. She was ultimately transferred out to the floor. Blood cultures grew actinomyces. Bacteremia workup initiated including TEE on 3/22 without clear vegetation. Overnight 3/21 >3/22 she developed tachypnea felt to represent anxiety, which did not improved with ativan/haldol administration. PCCM asked to see on 3/22.   Past Medical History:   has a past medical history of Arthritis, Complication of anesthesia, COPD (chronic obstructive pulmonary disease) ([place]), Heart valve replaced, Hyperlipidemia, Hypertension, LBBB (left bundle branch block), Lung cancer ([place]) (2013), Lung mass, Myocardial infarct ([place]), Panic attack, Pneumonia, Rib fracture, and Skin cancer of nose.   Significant Hospital Events:  3/16 admit to ICU with working diagnosis sepsis  Consults:  Cardiology ID  Procedures:  TEE 3/22 >>>  Significant Diagnostic Tests:   CT maxillofacial 3/22 >>>  Micro Data:  Blood 3/16 > Urine 3/16 >  Antimicrobials:  Flagyl 3/16 ED Cefepime 3/16 > Vancomycin 3/16 >  Interim History / Subjective:  Short of breath worse overnight Non-productive cough unchanged No fevers, chills Lower extremity edema somewhat worse, but not much.   Objective   Blood pressure 133/77, pulse (!) 47, temperature 98.3 F (36.8 C), temperature source Oral, resp. rate (!) 24, height 5\' 3"  (1.6 m), weight 78.2 kg, SpO2 98 %.    FiO2 (%):  [50 %] 50 %   Intake/Output Summary (Last 24 hours) at  11/11/2020 1458 Last data filed at 11/11/2020 0930 Gross per 24 hour  Intake 600 ml  Output --  Net 600 ml   Filed Weights   11/05/20 0707 11/05/20 1002 11/07/20 0500  Weight: 76.2 kg 78.2 kg 78.2 kg    Examination: General:  Elderly female in mild/moderate respiratory distress Neuro:  Lethargic. Will arouse to verbal stimuli but only a few words at a time HEENT:  [place]/AT, No JVD noted, PERRL Cardiovascular:  RRR, no MRG. 1+ pitting lower extremity edema Lungs:  Clear bilateral breath sounds. Rapid rate. Supraclavicular retractions.  Abdomen:  Soft, non-distended, non-tender. Normoactive Musculoskeletal:  No acute deformity Skin:  Intact, MMM  Resolved Hospital Problem list   AKI  Assessment & Plan:   Bacteremia: actinomyces. TEE negative 3/22.  - Antibiotics per ID - CT maxillofacial pending.  - Will need PICC for long term therapy  Respiratory distress: suspect pulmonary edema considering worsening LE edema. And bilateral infiltrates/effusion on CXR.  - Supplemental oxygen to keep sats > 92% - Lasix 60mg  x 2 IV - f/u BMP - Thoracentesis L  Acute metabolic encephalopathy: almost certainly related to medications she received for anxiety. Ativan and haldol multiple doses - hold further sedatives for now  Bilateral effusion: - R sampled 3/22. Suspect this is all volume/third spacing.  - Follow pleural fluid studies.   Echo changes- worsened MR, TAVR position slightly off Hx CABG, TAVR Afib RVR now rate controlled Trop Leak - Eliquis for AF - Cards following  Questionable COPD diagnosis - resume home LABA/LAMA/ICS   Allergies, HTN, HLD - per primary   Best practice (evaluated daily)  Diet: per primary Pain/Anxiety/Delirium  protocol (if indicated): NA VAP protocol (if indicated): NA DVT prophylaxis: eliquis GI prophylaxis: PPI Glucose control: NA Mobility: Bedrest Disposition: Remain in PCU for now  Goals of Care:  Last date of multidisciplinary goals of  care discussion: 3/16 Family and staff present: Dr Tamala Julian, myself, patient and both daughters Summary of discussion: Full code. No prolonged life support Follow up goals of care discussion due: 3/23 Code Status: FULL   Georgann Housekeeper, AGACNP-BC [place] for personal pager PCCM on call pager [phone] until 7pm. Please call Elink 7p-7a. [phone]  11/11/2020 3:55 PM

## 2020-11-11 NOTE — Progress Notes (Signed)
Patient has been restless with acute anxiety. Pt c/o SOB, and chest tightness. Rapid Response and Resp. Therapist were called, both at bedside, patient was placed on bi-pap, EKG done, Reached out to on call MD, Obtained PRN orders for Antianxiety meds.Marland Kitchen

## 2020-11-11 NOTE — Transfer of Care (Signed)
Immediate Anesthesia Transfer of Care Note  Patient: Tracy Buck  Procedure(s) Performed: TRANSESOPHAGEAL ECHOCARDIOGRAM (TEE) (N/A )  Patient Location: Endoscopy Unit  Anesthesia Type:MAC  Level of Consciousness: awake, alert  and oriented  Airway & Oxygen Therapy: Patient Spontanous Breathing and Patient connected to nasal cannula oxygen  Post-op Assessment: Report given to RN, Post -op Vital signs reviewed and stable and Patient moving all extremities  Post vital signs: Reviewed and stable  Last Vitals:  Vitals Value Taken Time  BP 109/60 11/11/20 0905  Temp    Pulse 85 11/11/20 0909  Resp 26 11/11/20 0909  SpO2 99 % 11/11/20 0909  Vitals shown include unvalidated device data.  Last Pain:  Vitals:   11/11/20 0845  TempSrc:   PainSc: 0-No pain      Patients Stated Pain Goal: 0 (39/53/20 2334)  Complications: No complications documented.

## 2020-11-11 NOTE — Progress Notes (Signed)
Progress Note  Patient Name: Tracy Buck Date of Encounter: 11/11/2020  [facility] Cardiologist:  [facility] Dr. Bishop Limbo  In [place] after TEE. Discussed care with daughter and nurse and bedside  Inpatient Medications    Scheduled Meds: . apixaban  5 mg Oral BID  . aspirin EC  81 mg Oral Daily  . Chlorhexidine Gluconate Cloth  6 each Topical Daily  . diltiazem  30 mg Oral Q6H  . feeding supplement  1 Container Oral TID BM  . fluticasone furoate-vilanterol  1 puff Inhalation Daily  . loratadine  10 mg Oral Daily  . melatonin  3 mg Oral QHS  . nystatin  5 mL Oral QID  . rosuvastatin  20 mg Oral Daily  . umeclidinium bromide  1 puff Inhalation Daily   Continuous Infusions: . sodium chloride Stopped (11/05/20 0847)  . penicillin g continuous IV infusion     PRN Meds: sodium chloride, acetaminophen, diphenhydrAMINE, docusate sodium, fentaNYL (SUBLIMAZE) injection, haloperidol lactate, ipratropium-albuterol, LORazepam, polyethylene glycol   Vital Signs    Vitals:   11/11/20 0855 11/11/20 0905 11/11/20 0930 11/11/20 1200  BP: (!) 101/59 109/60 111/73 133/77  Pulse: 81 (!) 47 84 (!) 47  Resp: (!) 26 (!) 26 (!) 24 (!) 24  Temp:    98.3 F (36.8 C)  TempSrc:    Oral  SpO2: 98% 99% 99% 98%  Weight:      Height:        Intake/Output Summary (Last 24 hours) at 11/11/2020 1244 Last data filed at 11/11/2020 0930 Gross per 24 hour  Intake 600 ml  Output 2 ml  Net 598 ml   Last 3 Weights 11/07/2020 11/05/2020 11/05/2020  Weight (lbs) 172 lb 6.4 oz 172 lb 6.4 oz 168 lb 1.6 oz  Weight (kg) 78.2 kg 78.2 kg 76.25 kg      Telemetry    Atrial fibrillation with HR in the  90s - Personally Reviewed  ECG    No new  Physical Exam   GEN: No acute distress.   Neck: No JVD Cardiac: iRRR, 2/6 SEM Respiratory: Clear to auscultation bilaterally. GI: Soft, nontender, non-distended  MS: No edema; No deformity. Neuro:  sedated Psych:  sedated   Labs    High Sensitivity Troponin:   Recent Labs  Lab 11/05/20 0650 11/05/20 1242 11/05/20 1508  TROPONINIHS 1,716* 1,693* 1,404*      Chemistry Recent Labs  Lab 11/08/20 0159 11/09/20 0114 11/10/20 0215 11/10/20 1019 11/11/20 0101  NA  --  130*  --  129* 132*  K  --  3.7  --  3.6 3.8  CL  --  100  --  102 104  CO2  --  22  --  21* 22  GLUCOSE  --  122*  --  231* 151*  BUN  --  9  --  6* 7*  CREATININE  --  0.77  --  0.82 0.75  CALCIUM  --  7.8*  --  7.7* 8.1*  PROT 5.3* 5.5* 5.4*  --   --   ALBUMIN 2.0* 1.9*  1.9* 1.9*  --   --   AST 23 21 34  --   --   ALT 35 27 34  --   --   ALKPHOS 100 93 86  --   --   BILITOT 1.5* 1.3* 1.3*  --   --   GFRNONAA  --  >60  --  >60 >60  ANIONGAP  --  8  --  6 6     Hematology Recent Labs  Lab 11/09/20 0114 11/10/20 0215 11/11/20 0101  WBC 11.8* 11.2* 13.5*  RBC 3.72* 3.64* 3.92  HGB 11.2* 10.7* 11.6*  HCT 33.0* 32.6* 35.7*  MCV 88.7 89.6 91.1  MCH 30.1 29.4 29.6  MCHC 33.9 32.8 32.5  RDW 14.9 14.7 14.9  PLT 170 185 209    BNP Recent Labs  Lab 11/05/20 0650  BNP 1,682.9*     DDimer No results for input(s): DDIMER in the last 168 hours.   Radiology    CT CHEST ABDOMEN PELVIS W CONTRAST  Addendum Date: 11/10/2020   ADDENDUM REPORT: 11/10/2020 00:29 ADDENDUM: Additional impression point: New edematous changes and skin thickening across the mons pubis. Possibly related to some developing body wall edema though recommend correlation with direct visualization to exclude cellulitis. Addendum will be called to the ordering clinician or representative by the Radiologist Assistant, and communication documented in the PACS or [employer]. Electronically Signed   By: Lovena Le M.D.   On: 11/10/2020 00:29   Result Date: 11/10/2020 CLINICAL DATA:  Sepsis, history of COPD, hypertension, bioprosthetic aortic valve replacement, CAD and carcinoid lung cancer post lobectomy EXAM: CT CHEST, ABDOMEN, AND PELVIS  WITH CONTRAST TECHNIQUE: Multidetector CT imaging of the chest, abdomen and pelvis was performed following the standard protocol during bolus administration of intravenous contrast. CONTRAST:  87mL OMNIPAQUE IOHEXOL 300 MG/ML  SOLN COMPARISON:  CT 11/05/2020 FINDINGS: CT CHEST FINDINGS Cardiovascular: Cardiac size is top normal. No sizable pericardial effusion, abnormal thickening or pericardial calcification. Coronary artery calcifications are present. Post TAVR. Mitral annular calcifications are seen. Prior sternotomy and CABG. Atherosclerotic plaque within the normal caliber aorta. Calcifications present in the proximal great vessels with shared origin of brachiocephalic and left common carotid artery. No acute abnormality the aorta or proximal great vessels. Central pulmonary arteries are normal caliber. No large central filling defects within the limitations of this non tailored examination of the pulmonary arteries. Mediastinum/Nodes: Postsurgical changes anterior mediastinum related to prior sternotomy and CABG. No mediastinal fluid or gas. Normal thyroid gland and thoracic inlet. No acute abnormality of the trachea or esophagus. No worrisome mediastinal, hilar or axillary adenopathy. Surgical material in the left hilum likely related prior lobectomy. Lungs/Pleura: Prior left lower lobectomy with relative hyperexpansion of the left upper lobe. Redemonstration of the scattered small pulmonary nodules in both lungs, largest in the left upper lobe measuring up to 7 mm in size (5/76). No new conspicuous or enlarging pulmonary nodules are seen. There are small bilateral pleural effusions with slightly lobular margins which could reflect some underlying loculation. Adjacent areas of passive atelectasis in both lung bases. Underlying airspace disease is difficult to fully exclude. Some mild interlobular septal thickening is noted towards the lung apices. Mild pulmonary vascular redistribution is noted as well.  Minimal airways thickening and scattered secretions. Musculoskeletal: Prior sternotomy with intact and aligned sternal sutures. No aggressive lytic or blastic lesions. Exaggerated thoracic kyphosis and mild dextrocurvature of the spine. Multilevel degenerative changes are present in the imaged portions of the spine. Additional degenerative changes in the shoulders. CT ABDOMEN PELVIS FINDINGS Hepatobiliary: Hypoattenuation adjacent the falciform ligament likely reflecting some regions of focal fatty infiltration. More diffuse hepatic hypoattenuation likely reflecting more mild global hepatic steatosis. Smooth liver surface contour. No concerning focal liver lesion. Gallbladder appears surgically absent. Slight prominence of the biliary tree is similar to comparison imaging and likely reflect some post cholecystectomy  reservoir effect. Pancreas: No pancreatic ductal dilatation or surrounding inflammatory changes. Spleen: Normal in size. No concerning splenic lesions. Adrenals/Urinary Tract: Normal adrenal glands. The kidneys enhance and excrete symmetrically. Redemonstration of an exophytic, lobular fluid attenuation cyst arising from the upper pole right kidney. Additional subcentimeter hypoattenuating foci are present in both kidneys too small to fully characterize on CT imaging but statistically likely benign. Bilateral extrarenal pelves are again seen. No concerning renal mass, obstructive urolithiasis or hydronephrosis. Stomach/Bowel: Small hiatal hernia. Stomach is otherwise unremarkable. The previously seen inflammatory changes centered upon the duodenum near the porta hepatis/gallbladder fossa have significantly diminished from the comparison exam. No extraluminal gas, organized abscess or collection is seen. Noninflamed small air-filled duodenal diverticulum near the ligament of Treitz (6/57). Normal sweep across the midline abdomen. No small bowel thickening or dilatation. No evidence of obstruction. No  colonic dilatation or wall thickening. Scattered colonic diverticula without focal inflammation to suggest diverticulitis. Vascular/Lymphatic: Atherosclerotic calcifications within the abdominal aorta and branch vessels. No aneurysm or ectasia. No enlarged abdominopelvic lymph nodes. Reproductive: Normal appearance of the uterus and adnexal structures. Other: Mild body wall edema most pronounced in the posterior midline and flanks. Some slightly more focal edematous change in skin thickening noted along the mons pubis, new from prior. Tiny fat containing umbilical hernia. No bowel containing hernias. No free air or fluid. Notable resolution of the previously seen perihepatic ascites. No organized abscess or collection. Musculoskeletal: No acute osseous abnormality or suspicious osseous lesion. Stable hemangioma L1. Multilevel degenerative changes are present in the imaged portions of the spine. Additional degenerative changes in the hips and pelvis. IMPRESSION: 1. The previously seen inflammatory changes centered upon the duodenum near the porta hepatis/gallbladder fossa have significantly diminished from the comparison exam. No extraluminal gas, organized abscess or collection is seen. 2. Small bilateral pleural effusions with slightly lobular margins which could reflect some underlying loculation. Adjacent areas of passive atelectasis in both lung bases. Underlying airspace disease is difficult to fully exclude. 3. Mild pulmonary vascular redistribution, suggestive of mild interstitial edema. 4. Stable appearance of the scattered small pulmonary nodules in both lungs, largest in the left upper lobe measuring up to 7 mm in size. 5. Colonic diverticulosis without evidence of diverticulitis. 6. Hepatic steatosis. 7. Coronary atherosclerosis with evidence of prior sternotomy, CABG and TAVR. 8. Aortic Atherosclerosis (ICD10-I70.0). Electronically Signed: By: Lovena Le M.D. On: 11/09/2020 23:13   [place] EKG SITE  RITE  Result Date: 11/11/2020 If [facility] image not attached, placement could not be confirmed due to current cardiac rhythm.   Cardiac Studies   Echo 11/05/2020 1. S/p TAVR (23 mm evolout pro). MG slightly higher across prosthesis  likely due to Afib with RVR and sepsis. MR is now moderate to severe. TR  is now moderate to severe. Would have low threshold for TEE if the patient  develops bacteremia with prosthetic  AVR and change in MR/TR. No definitive vegetation seen on this TTE.  2. 23 mm evolute pro (ViV procedure for prior 21 mm Magna ease) in the  aortic position. Vmax 2.8 m/s, MG 21 mmHG, EOA 1.18 cm2, DI 0.28. MG  slightly higher than what was reported at [facility] (15 mmHG 05/30/2020),  likely due to Afib and sepsis. The AT is  slightly prolonged ~114 msec. If bacteremia is present would recommend  TEE. The aortic valve has been repaired/replaced. Aortic valve  regurgitation is not visualized. There is a 23 mm CoreValve-Evolut Pro  prosthetic (TAVR) valve present in  the aortic  position. Procedure Date: 05/01/2020.  3. Left ventricular ejection fraction, by estimation, is 50 to 55%. The  left ventricle has low normal function. The left ventricle has no regional  wall motion abnormalities. There is mild concentric left ventricular  hypertrophy. Left ventricular  diastolic function could not be evaluated.  4. Right ventricular systolic function is moderately reduced. The right  ventricular size is mildly enlarged. There is moderately elevated  pulmonary artery systolic pressure. The estimated right ventricular  systolic pressure is 25.4 mmHg.  5. Right atrial size was mildly dilated.  6. The mitral valve is degenerative. Moderate to severe mitral valve  regurgitation. No evidence of mitral stenosis.  7. The tricuspid valve is abnormal. Tricuspid valve regurgitation is  moderate to severe.  8. The inferior vena cava is dilated in size with >50% respiratory  variability,  suggesting right atrial pressure of 8 mmHg.   Patient Profile     86 y.o. 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   Assessment & Plan    1. New onset afib RVR  - Started on IV Cardizem with improved HR then discontinued due to hypotension - HR better controlled with initiation of diltiazem 30 mg Q6hr, can decrease or change as needed for hypotension.  - LVEF low normal to 50-55% - continue Apixaban 5mg  BID (CHADS2VASC score 5).  Consider outpatient DCCV if indicated in 3-4 weeks after [facility].   2. Elevated troponin  - not ACS pattern >>flat trend ([id number]) - she has never had CP and no focal WMA on echo - Likely demand ischemia, monitor for chest pain or HF symptoms.  3. Valvular heart dz - Echo with LVEF of 50-55% - S/p TAVR (23 mm evolout pro). MG 21 mmHG, EOA 1.18 cm2, DI 0.28. MG  slightly higher than what was reported at [facility] (15 mmHG 05/30/2020),  likely due to Afib and sepsis.  Gradient was within normal range on TEE this morning with no apparent issues with prosthetic valve. -Moderate severe MR and TR, on TEE this morning TR felt to be severe, MR felt to be mild to moderate. 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  Antibiotic plan is detailed in infectious diseases note from today.  For questions or updates, please contact [place] Please consult [email or web address] for contact info under        Signed, Elouise Munroe, MD  11/11/2020, 12:44 PM

## 2020-11-11 NOTE — Progress Notes (Signed)
  Echocardiogram 2D Echocardiogram has been performed.  Tracy Buck 11/11/2020, 8:52 AM

## 2020-11-11 NOTE — Progress Notes (Signed)
PROGRESS NOTE    Tracy Buck  ZDG:[id number] DOB: Jan 09, 1935 DOA: 11/05/2020 PCP: Drake Leach, MD   Brief Narrative:  This 86 year old female with history of COPD, hypertension, bioprosthetic aortic valve, 2021, CAD and lung cancer (carcinoid) s/p lobectomy. She was in her usual state of health until approximately 3/6 when she developed progressive fatigue. She has seen her PCP and cardiologist with these complaints without identifiable etiology. It was felt that perhaps polypharmacy was playing a role. Her PRN alprazolam was discontinued and Ambien dose was reduced from 10mg  to 5mg  QHS. On 3/16, early a.m.,  She has developed shortness of breath and chest tightness, presented to [place].  EKG was felt nonischemic, had atrial fibrillation with RVR.  Patient was started on diltiazem bolus and infusion.  Patient also had low-grade fever 100 F, leukocytosis, placed on empiric antibiotic and sepsis work-up was initiated. Chest x-ray showed left-sided effusion at her former lobectomy site, creatinine 1.3, lactic acid 8.7, troponin 1700, BNP 1682. Patient then became hypotensive and was admitted to ICU.  She was admitted for severe sepsis which was presumed to be due to duodenitis and antibiotics were narrowed, now on IV Zosyn, for 7 to 10 days.  Cardiology was also consulted for atrial fibrillation with RVR and abnormal echo changes (worsened MR, TAVR position slightly off).  Patient was transferred to [facility] service on 11/07/2020.  Blood cultures grew actinomyces neuii, infectious disease consulted.  Infectious disease recommended TEE to rule out infection endocarditis.  Patient underwent TEE: No vegetation was found.  Infectious disease recommended Penicillin therapy for 1 month followed by oral amoxicillin to reach 6 months to a year of therapy.  Assessment & Plan:   Principal Problem:   Actinomyces infection Active Problems:   S/P AVR (aortic valve replacement)   Acute respiratory  failure with hypoxia (HCC)   Lactic acidosis   Elevated troponin   Atrial fibrillation with RVR (HCC)   Severe sepsis with septic shock (HCC)   Malnutrition of moderate degree   Acute renal failure (HCC)   Bacteremia   Fatigue   Severe sepsis presumed to be due to duodenitis, present on admission -Urine culture negative.  Blood culture positive for gram-positive rods, actinomyces neuii -CT renal stone study showed inflammatory changes surrounding the duodenum and adjacent to the head of the pancreas with extension into the region of porta hepatis, concerning for probable duodenitis. -Leukocytosis improving, afebrile -IV antibiotics narrowed to Zosyn (previously on IV vancomycin, cefepime and Flagyl), narrowed down to Unasyn.  -Advance diet to solids. -Infectious disease consulted, recommended TEE to rule out infective endocarditis.  TEE shows no vegetations. -Infectious disease recommended penicillin therapy for 1 month followed by oral amoxicillin to reach 6 months to a year of therapy. - PICC Line 3/23  Active Problems: Atrial fibrillation with RVR, New onset -Patient was placed on IV Cardizem drip, heart rate improved however patient developed hypotension -Cardiology consulted, heart rate now increased, started Cardizem 30 mg every 6hrs -Patient was placed on IV heparin, now transitioned to apixaban -Elevated troponin likely due to demand ischemia, atrial fibrillation with RVR, not ACS -2D echo with LVEF of 50 to 55% -Heart rate is controlled.  Patient is currently on Eliquis.  Abnormal EKG changes, worsened MR, TR, TAVR position change -Cardiology following, MR is now moderate to severe, TR is now moderate to severe -Blood cultures grew actinomyces neuii, infectious disease recommended TEE. -Patient underwent TEE, no vegetations found.  Acute kidney injury with metabolic acidosis, lactic acidosis >  Resolved. -Baseline creatinine 0.9-1.05 -Presented with creatinine of 1.3,  lactic acid 8.7 on admission -Patient was placed on bicarb drip, lactic acid improved to 2.3, bicarb normal 24, creatinine 0.9 -Diet advanced to solids today Acute kidney injury has resolved.  Metabolic acidosis has resolved.  Last CO2 was 24.  COPD -Currently stable, no wheezing, continue outpatient inhalers.  Hypertension -BP currently stable, holding antihypertensives.  Hyperlipidemia Continue statin  Generalized weakness: Patient reports progressive fatigue, dizziness, generalized debility. Patient was seen by her PCP, cardiologist, no identifiable etiology found.   Perhaps polypharmacy could be the reason.  Xanax was discontinued.   Ambien dose decreased from 10 mg to 5 mg, now discontinued -Continue melatonin -Would not resume Xanax outpatient, discussed with patient's daughter -Continue PT OT, mobilization, very deconditioned.  Malnutrition of moderate degree Very deconditioned, diet advanced , albumin 2.0 Estimated body mass index is 30.54 kg/m as calculated from the following:   Height as of this encounter: 5\' 3"  (1.6 m).   Weight as of this encounter: 78.2 kg.   DVT prophylaxis: Eliquis Code Status: Full code. Family Communication:  Daughter at bed side. Disposition Plan:   Status is: Inpatient  Remains inpatient appropriate because:Inpatient level of care appropriate due to severity of illness   Dispo: The patient is from: Home              Anticipated d/c is to: Home with Home services.              Patient currently is not medically stable to d/c.   Difficult to place patient No  Consultants:   Cardiology  Infectious disease.  Procedures: TTE, scheduled TEE Antimicrobials:   Anti-infectives (From admission, onward)   Start     Dose/Rate Route Frequency Ordered Stop   11/11/20 1145  penicillin G potassium 12 Million Units in dextrose 5 % 500 mL continuous infusion        12 Million Units 41.7 mL/hr over 12 Hours Intravenous Every 12 hours  11/11/20 1045     11/09/20 1130  Ampicillin-Sulbactam (UNASYN) 3 g in sodium chloride 0.9 % 100 mL IVPB  Status:  Discontinued        3 g 200 mL/hr over 30 Minutes Intravenous Every 6 hours 11/09/20 1025 11/11/20 1045   11/06/20 0600  vancomycin (VANCOREADY) IVPB 750 mg/150 mL  Status:  Discontinued        750 mg 150 mL/hr over 60 Minutes Intravenous Every 24 hours 11/05/20 0757 11/06/20 0745   11/05/20 2000  ceFEPIme (MAXIPIME) 2 g in sodium chloride 0.9 % 100 mL IVPB  Status:  Discontinued        2 g 200 mL/hr over 30 Minutes Intravenous Every 12 hours 11/05/20 0757 11/05/20 1704   11/05/20 1745  piperacillin-tazobactam (ZOSYN) IVPB 3.375 g  Status:  Discontinued        3.375 g 12.5 mL/hr over 240 Minutes Intravenous Every 8 hours 11/05/20 1720 11/09/20 1015   11/05/20 0730  ceFEPIme (MAXIPIME) 2 g in sodium chloride 0.9 % 100 mL IVPB        2 g 200 mL/hr over 30 Minutes Intravenous  Once 11/05/20 0716 11/05/20 0814   11/05/20 0730  metroNIDAZOLE (FLAGYL) IVPB 500 mg        500 mg 100 mL/hr over 60 Minutes Intravenous  Once 11/05/20 0716 11/05/20 0850   11/05/20 0730  vancomycin (VANCOCIN) IVPB 1000 mg/200 mL premix        1,000 mg 200 mL/hr over 60  Minutes Intravenous  Once 11/05/20 0716 11/05/20 0914      Subjective: Patient was seen and examined at bedside.  Denies any overnight events.   Patient appears under the effects of sedation. She responds appropriately, alert and oriented x 3.    Objective: Vitals:   11/11/20 0855 11/11/20 0905 11/11/20 0930 11/11/20 1200  BP: (!) 101/59 109/60 111/73 133/77  Pulse: 81 (!) 47 84 (!) 47  Resp: (!) 26 (!) 26 (!) 24 (!) 24  Temp:    98.3 F (36.8 C)  TempSrc:    Oral  SpO2: 98% 99% 99% 98%  Weight:      Height:        Intake/Output Summary (Last 24 hours) at 11/11/2020 1328 Last data filed at 11/11/2020 0930 Gross per 24 hour  Intake 600 ml  Output --  Net 600 ml   Filed Weights   11/05/20 0707 11/05/20 1002 11/07/20 0500   Weight: 76.2 kg 78.2 kg 78.2 kg    Examination:  General exam: Appears calm and comfortable. Not in any acute distress. Respiratory system: Clear to auscultation. Respiratory effort normal. Cardiovascular system: S1 & S 2 heard, RRR. No JVD, murmurs, rubs, gallops or clicks. No pedal edema. Gastrointestinal system: Abdomen is nondistended, soft and nontender.  No organomegaly or masses felt. Normal bowel sounds heard. Central nervous system: Alert and oriented. No focal neurological deficits. Extremities: Symmetric 5 x 5 power.  No edema, no cyanosis, no clubbing. Skin: No rashes, lesions or ulcers Psychiatry: Judgement and insight appear normal. Mood & affect appropriate.     Data Reviewed: I have personally reviewed following labs and imaging studies  CBC: Recent Labs  Lab 11/05/20 0649 11/05/20 0730 11/05/20 1148 11/06/20 0137 11/07/20 0117 11/08/20 0159 11/09/20 0114 11/10/20 0215 11/11/20 0101  WBC 20.5*  --  26.2*   < > 14.5* 13.3* 11.8* 11.2* 13.5*  NEUTROABS 17.5*  --  25.4*  --   --   --   --   --   --   HGB 12.7   < > 10.7*   < > 11.3* 11.5* 11.2* 10.7* 11.6*  HCT 38.6   < > 30.9*   < > 32.1* 34.2* 33.0* 32.6* 35.7*  MCV 90.8  --  87.0   < > 86.5 87.7 88.7 89.6 91.1  PLT 161  --  108*   < > 160 170 170 185 209   < > = values in this interval not displayed.   Basic Metabolic Panel: Recent Labs  Lab 11/05/20 1148 11/06/20 0137 11/09/20 0114 11/10/20 1019 11/11/20 0101  NA 127* 132* 130* 129* 132*  K 3.1* 4.3 3.7 3.6 3.8  CL 101 102 100 102 104  CO2 18* 24 22 21* 22  GLUCOSE 176* 121* 122* 231* 151*  BUN 15 12 9  6* 7*  CREATININE 1.20* 0.93 0.77 0.82 0.75  CALCIUM 7.8* 7.7* 7.8* 7.7* 8.1*  MG 1.7 2.2 1.9  --  2.0  PHOS 2.5 3.5 3.3  --  3.6   GFR: Estimated Creatinine Clearance: 52.7 mL/min (by C-G formula based on SCr of 0.75 mg/dL). Liver Function Tests: Recent Labs  Lab 11/06/20 0137 11/07/20 0117 11/08/20 0159 11/09/20 0114 11/10/20 0215   AST 58* 43* 23 21 34  ALT 45* 50* 35 27 34  ALKPHOS 130* 110 100 93 86  BILITOT 2.3* 1.7* 1.5* 1.3* 1.3*  PROT 4.9* 5.0* 5.3* 5.5* 5.4*  ALBUMIN 2.0* 2.0* 2.0* 1.9*  1.9* 1.9*   Recent  Labs  Lab 11/05/20 1823  LIPASE 25   No results for input(s): AMMONIA in the last 168 hours. Coagulation Profile: Recent Labs  Lab 11/05/20 0726  INR 1.3*   Cardiac Enzymes: No results for input(s): CKTOTAL, CKMB, CKMBINDEX, TROPONINI in the last 168 hours. BNP (last 3 results) No results for input(s): PROBNP in the last 8760 hours. HbA1C: No results for input(s): HGBA1C in the last 72 hours. CBG: Recent Labs  Lab 11/05/20 1155 11/05/20 1600 11/05/20 1936 11/06/20 0333  GLUCAP 179* 164* 133* 107*   Lipid Profile: No results for input(s): CHOL, HDL, LDLCALC, TRIG, CHOLHDL, LDLDIRECT in the last 72 hours. Thyroid Function Tests: No results for input(s): TSH, T4TOTAL, FREET4, T3FREE, THYROIDAB in the last 72 hours. Anemia Panel: No results for input(s): VITAMINB12, FOLATE, FERRITIN, TIBC, IRON, RETICCTPCT in the last 72 hours. Sepsis Labs: Recent Labs  Lab 11/05/20 0650 11/05/20 1148 11/05/20 1508  PROCALCITON  --  68.77  --   LATICACIDVEN 8.7* 2.2* 2.3*    Recent Results (from the past 240 hour(s))  Resp Panel by RT-PCR (Flu A&B, Covid) Nasopharyngeal Swab     Status: None   Collection Time: 11/05/20  7:19 AM   Specimen: Nasopharyngeal Swab; Nasopharyngeal(NP) swabs in vial transport medium  Result Value Ref Range Status   SARS Coronavirus 2 by RT PCR NEGATIVE NEGATIVE Final    Comment: (NOTE) SARS-CoV-2 target nucleic acids are NOT DETECTED.  The SARS-CoV-2 RNA is generally detectable in upper respiratory specimens during the acute phase of infection. The lowest concentration of SARS-CoV-2 viral copies this assay can detect is 138 copies/mL. A negative result does not preclude SARS-Cov-2 infection and should not be used as the sole basis for treatment or other patient  management decisions. A negative result may occur with  improper specimen collection/handling, submission of specimen other than nasopharyngeal swab, presence of viral mutation(s) within the areas targeted by this assay, and inadequate number of viral copies(<138 copies/mL). A negative result must be combined with clinical observations, patient history, and epidemiological information. The expected result is Negative.  Fact Sheet for Patients:  EntrepreneurPulse.com.au  Fact Sheet for Healthcare Providers:  IncredibleEmployment.be  This test is no t yet approved or cleared by the [place] FDA and  has been authorized for detection and/or diagnosis of SARS-CoV-2 by FDA under an Emergency Use Authorization (EUA). This EUA will remain  in effect (meaning this test can be used) for the duration of the COVID-19 declaration under Section 564(b)(1) of the Act, 21 U.S.C.section 360bbb-3(b)(1), unless the authorization is terminated  or revoked sooner.       Influenza A by PCR NEGATIVE NEGATIVE Final   Influenza B by PCR NEGATIVE NEGATIVE Final    Comment: (NOTE) The Xpert Xpress SARS-CoV-2/FLU/RSV plus assay is intended as an aid in the diagnosis of influenza from Nasopharyngeal swab specimens and should not be used as a sole basis for treatment. Nasal washings and aspirates are unacceptable for Xpert Xpress SARS-CoV-2/FLU/RSV testing.  Fact Sheet for Patients: EntrepreneurPulse.com.au  Fact Sheet for Healthcare Providers: IncredibleEmployment.be  This test is not yet approved or cleared by the [place] FDA and has been authorized for detection and/or diagnosis of SARS-CoV-2 by FDA under an Emergency Use Authorization (EUA). This EUA will remain in effect (meaning this test can be used) for the duration of the COVID-19 declaration under Section 564(b)(1) of the Act, 21 U.S.C. section 360bbb-3(b)(1),  unless the authorization is terminated or revoked.  Performed at [facility],  [address] [place], [place] [address]   Urine culture     Status: None   Collection Time: 11/05/20  7:26 AM   Specimen: In/Out Cath Urine  Result Value Ref Range Status   Specimen Description   Final    IN/OUT CATH URINE Performed at [facility], [address] [place], [place] [address]    Special Requests   Final    NONE Performed at [facility], [address] [place], [place] [address]    Culture   Final    NO GROWTH Performed at [facility], [place] [address], [place], [place] [address]    Report Status 11/06/2020 FINAL  Final  Blood Culture (routine x 2)     Status: Abnormal   Collection Time: 11/05/20  7:27 AM   Specimen: BLOOD RIGHT HAND  Result Value Ref Range Status   Specimen Description   Final    BLOOD RIGHT HAND Performed at [facility], [address] [place], [place] [address]    Special Requests   Final    BOTTLES DRAWN AEROBIC AND ANAEROBIC Blood Culture adequate volume Performed at [facility], [address] [place], [place] [address]    Culture  Setup Time   Final    GRAM POSITIVE RODS IN BOTH AEROBIC AND ANAEROBIC BOTTLES CRITICAL RESULT CALLED TO, READ BACK BY AND VERIFIED WITH: PHARMD HALEY B. 1936 [id number] FCP    Culture (A)  Final    ACTINOMYCES NEUII Standardized susceptibility testing for this organism is not available. Performed at [facility], [place] [address] [place], [place] [address]    Report Status 11/09/2020 FINAL  Final  MRSA PCR Screening     Status: None   Collection Time: 11/05/20 11:19 AM   Specimen: Nasal Mucosa; Nasopharyngeal  Result Value Ref Range Status   MRSA by PCR NEGATIVE NEGATIVE Final    Comment:        The GeneXpert MRSA Assay (FDA approved for NASAL specimens only), is one component of a comprehensive MRSA colonization surveillance program. It is  not intended to diagnose MRSA infection nor to guide or monitor treatment for MRSA infections. Performed at [facility], [place] [address], [place], [place] [address]   Culture, blood (routine x 2)     Status: None (Preliminary result)   Collection Time: 11/09/20 10:16 AM   Specimen: BLOOD LEFT HAND  Result Value Ref Range Status   Specimen Description BLOOD LEFT HAND  Final   Special Requests   Final    BOTTLES DRAWN AEROBIC ONLY Blood Culture results may not be optimal due to an inadequate volume of blood received in culture bottles   Culture   Final    NO GROWTH 2 DAYS Performed at [facility], [place] [address], [place], [place] [address]    Report Status PENDING  Incomplete  Culture, blood (routine x 2)     Status: None (Preliminary result)   Collection Time: 11/09/20 10:24 AM   Specimen: BLOOD RIGHT HAND  Result Value Ref Range Status   Specimen Description BLOOD RIGHT HAND  Final   Special Requests   Final    BOTTLES DRAWN AEROBIC ONLY Blood Culture adequate volume   Culture   Final    NO GROWTH 2 DAYS Performed at [facility], [place] [address], [place],  [address]    Report Status PENDING  Incomplete    Radiology Studies: CT CHEST ABDOMEN PELVIS W CONTRAST  Addendum Date: 11/10/2020   ADDENDUM REPORT: 11/10/2020 00:29 ADDENDUM: Additional impression point: New edematous changes and skin thickening across the mons pubis. Possibly related to some developing body wall edema though recommend correlation with direct visualization to exclude cellulitis. Addendum will be called to the ordering clinician or representative by the Radiologist Assistant, and communication documented in the PACS or [employer]. Electronically Signed   By: Lovena Le M.D.   On: 11/10/2020 00:29   Result Date: 11/10/2020 CLINICAL DATA:  Sepsis, history of COPD, hypertension, bioprosthetic aortic valve replacement, CAD and carcinoid lung cancer post lobectomy EXAM: CT CHEST,  ABDOMEN, AND PELVIS WITH CONTRAST TECHNIQUE: Multidetector CT imaging of the chest, abdomen and pelvis was performed following the standard protocol during bolus administration of intravenous contrast. CONTRAST:  28mL OMNIPAQUE IOHEXOL 300 MG/ML  SOLN COMPARISON:  CT 11/05/2020 FINDINGS: CT CHEST FINDINGS Cardiovascular: Cardiac size is top normal. No sizable pericardial effusion, abnormal thickening or pericardial calcification. Coronary artery calcifications are present. Post TAVR. Mitral annular calcifications are seen. Prior sternotomy and CABG. Atherosclerotic plaque within the normal caliber aorta. Calcifications present in the proximal great vessels with shared origin of brachiocephalic and left common carotid artery. No acute abnormality the aorta or proximal great vessels. Central pulmonary arteries are normal caliber. No large central filling defects within the limitations of this non tailored examination of the pulmonary arteries. Mediastinum/Nodes: Postsurgical changes anterior mediastinum related to prior sternotomy and CABG. No mediastinal fluid or gas. Normal thyroid gland and thoracic inlet. No acute abnormality of the trachea or esophagus. No worrisome mediastinal, hilar or axillary adenopathy. Surgical material in the left hilum likely related prior lobectomy. Lungs/Pleura: Prior left lower lobectomy with relative hyperexpansion of the left upper lobe. Redemonstration of the scattered small pulmonary nodules in both lungs, largest in the left upper lobe measuring up to 7 mm in size (5/76). No new conspicuous or enlarging pulmonary nodules are seen. There are small bilateral pleural effusions with slightly lobular margins which could reflect some underlying loculation. Adjacent areas of passive atelectasis in both lung bases. Underlying airspace disease is difficult to fully exclude. Some mild interlobular septal thickening is noted towards the lung apices. Mild pulmonary vascular redistribution is  noted as well. Minimal airways thickening and scattered secretions. Musculoskeletal: Prior sternotomy with intact and aligned sternal sutures. No aggressive lytic or blastic lesions. Exaggerated thoracic kyphosis and mild dextrocurvature of the spine. Multilevel degenerative changes are present in the imaged portions of the spine. Additional degenerative changes in the shoulders. CT ABDOMEN PELVIS FINDINGS Hepatobiliary: Hypoattenuation adjacent the falciform ligament likely reflecting some regions of focal fatty infiltration. More diffuse hepatic hypoattenuation likely reflecting more mild global hepatic steatosis. Smooth liver surface contour. No concerning focal liver lesion. Gallbladder appears surgically absent. Slight prominence of the biliary tree is similar to comparison imaging and likely reflect some post cholecystectomy reservoir effect. Pancreas: No pancreatic ductal dilatation or surrounding inflammatory changes. Spleen: Normal in size. No concerning splenic lesions. Adrenals/Urinary Tract: Normal adrenal glands. The kidneys enhance and excrete symmetrically. Redemonstration of an exophytic, lobular fluid attenuation cyst arising from the upper pole right kidney. Additional subcentimeter hypoattenuating foci are present in both kidneys too small to fully characterize on CT imaging but statistically likely benign. Bilateral extrarenal pelves are again seen. No concerning renal mass, obstructive urolithiasis or hydronephrosis. Stomach/Bowel: Small hiatal hernia. Stomach is otherwise unremarkable. The previously seen inflammatory changes centered upon the duodenum near  the porta hepatis/gallbladder fossa have significantly diminished from the comparison exam. No extraluminal gas, organized abscess or collection is seen. Noninflamed small air-filled duodenal diverticulum near the ligament of Treitz (6/57). Normal sweep across the midline abdomen. No small bowel thickening or dilatation. No evidence of  obstruction. No colonic dilatation or wall thickening. Scattered colonic diverticula without focal inflammation to suggest diverticulitis. Vascular/Lymphatic: Atherosclerotic calcifications within the abdominal aorta and branch vessels. No aneurysm or ectasia. No enlarged abdominopelvic lymph nodes. Reproductive: Normal appearance of the uterus and adnexal structures. Other: Mild body wall edema most pronounced in the posterior midline and flanks. Some slightly more focal edematous change in skin thickening noted along the mons pubis, new from prior. Tiny fat containing umbilical hernia. No bowel containing hernias. No free air or fluid. Notable resolution of the previously seen perihepatic ascites. No organized abscess or collection. Musculoskeletal: No acute osseous abnormality or suspicious osseous lesion. Stable hemangioma L1. Multilevel degenerative changes are present in the imaged portions of the spine. Additional degenerative changes in the hips and pelvis. IMPRESSION: 1. The previously seen inflammatory changes centered upon the duodenum near the porta hepatis/gallbladder fossa have significantly diminished from the comparison exam. No extraluminal gas, organized abscess or collection is seen. 2. Small bilateral pleural effusions with slightly lobular margins which could reflect some underlying loculation. Adjacent areas of passive atelectasis in both lung bases. Underlying airspace disease is difficult to fully exclude. 3. Mild pulmonary vascular redistribution, suggestive of mild interstitial edema. 4. Stable appearance of the scattered small pulmonary nodules in both lungs, largest in the left upper lobe measuring up to 7 mm in size. 5. Colonic diverticulosis without evidence of diverticulitis. 6. Hepatic steatosis. 7. Coronary atherosclerosis with evidence of prior sternotomy, CABG and TAVR. 8. Aortic Atherosclerosis (ICD10-I70.0). Electronically Signed: By: Lovena Le M.D. On: 11/09/2020 23:13   [place]  EKG SITE RITE  Result Date: 11/11/2020 If [facility] image not attached, placement could not be confirmed due to current cardiac rhythm.  Scheduled Meds: . apixaban  5 mg Oral BID  . aspirin EC  81 mg Oral Daily  . Chlorhexidine Gluconate Cloth  6 each Topical Daily  . diltiazem  30 mg Oral Q6H  . feeding supplement  1 Container Oral TID BM  . fluticasone furoate-vilanterol  1 puff Inhalation Daily  . loratadine  10 mg Oral Daily  . melatonin  3 mg Oral QHS  . nystatin  5 mL Oral QID  . rosuvastatin  20 mg Oral Daily  . umeclidinium bromide  1 puff Inhalation Daily   Continuous Infusions: . sodium chloride Stopped (11/05/20 0847)  . penicillin g continuous IV infusion       LOS: 6 days    Time spent: 35 mins    Nyeshia Mysliwiec, MD [facility]   If 7PM-7AM, please contact night-coverage

## 2020-11-11 NOTE — Significant Event (Signed)
Rapid Response Event Note   Reason for Call :  Chest pain/restlessness, STE alarm on monitor  Pt was given 0.5mg  ativan and 3mg  melatonin at 2100 tonight.  Initial Focused Assessment:  Pt lying in bed with eyes closed, restless, tachypneic. Pt denies chest pain at this time but did tell bedside RN earlier that her chest hurt. Lungs clear and diminished. Skin warm and dry.  T-98.1, HR-108, BP-146/81, RR-28, SpO2-100% on .50 bipap.   Interventions:  EKG-Afib with RVR, no STE  Plan of Care:  After EKG done, pt said she was going to try to sleep-leave on bipap if she can tolerate. She is still tachypneic, denies chest pain. Ativan q4h prn-can give more at 0100.   Continue to monitor pt closely. Call RRT if further assistance needed.    Event Summary:   MD Notified:  Call [place] Time:0020  Dillard [place], RN

## 2020-11-11 NOTE — Anesthesia Postprocedure Evaluation (Signed)
Anesthesia Post Note  Patient: Tracy Buck  Procedure(s) Performed: TRANSESOPHAGEAL ECHOCARDIOGRAM (TEE) (N/A )     Patient location during evaluation: Endoscopy Anesthesia Type: MAC Level of consciousness: awake and alert Pain management: pain level controlled Vital Signs Assessment: post-procedure vital signs reviewed and stable Respiratory status: spontaneous breathing, nonlabored ventilation, respiratory function stable and patient connected to nasal cannula oxygen Cardiovascular status: stable and blood pressure returned to baseline Postop Assessment: no apparent nausea or vomiting Anesthetic complications: no   No complications documented.  Last Vitals:  Vitals:   11/11/20 0905 11/11/20 0930  BP: 109/60 111/73  Pulse: (!) 47 84  Resp: (!) 26 (!) 24  Temp:    SpO2: 99% 99%    Last Pain:  Vitals:   11/11/20 0930  TempSrc:   PainSc: [place]

## 2020-11-11 NOTE — Interval H&P Note (Signed)
History and Physical Interval Note:  11/11/2020 8:07 AM  Tracy Buck  has presented today for surgery, with the diagnosis of bacteremia.  The various methods of treatment have been discussed with the patient and family. After consideration of risks, benefits and other options for treatment, the patient has consented to  Procedure(s): TRANSESOPHAGEAL ECHOCARDIOGRAM (TEE) (N/A) as a surgical intervention.  The patient's history has been reviewed, patient examined, no change in status, stable for surgery.  I have reviewed the patient's chart and labs.  Questions were answered to the patient's satisfaction.     Skeet Latch, MD

## 2020-11-11 NOTE — Progress Notes (Signed)
RT NOTES: ABG obtained and sent to lab. Lab tech notified.  

## 2020-11-11 NOTE — Progress Notes (Addendum)
Subjective: Is still recovering from her sedation from TEE   Antibiotics:  Anti-infectives (From admission, onward)   Start     Dose/Rate Route Frequency Ordered Stop   11/11/20 1145  penicillin G potassium 12 Million Units in dextrose 5 % 500 mL continuous infusion        12 Million Units 41.7 mL/hr over 12 Hours Intravenous Every 12 hours 11/11/20 1045     11/09/20 1130  Ampicillin-Sulbactam (UNASYN) 3 g in sodium chloride 0.9 % 100 mL IVPB  Status:  Discontinued        3 g 200 mL/hr over 30 Minutes Intravenous Every 6 hours 11/09/20 1025 11/11/20 1045   11/06/20 0600  vancomycin (VANCOREADY) IVPB 750 mg/150 mL  Status:  Discontinued        750 mg 150 mL/hr over 60 Minutes Intravenous Every 24 hours 11/05/20 0757 11/06/20 0745   11/05/20 2000  ceFEPIme (MAXIPIME) 2 g in sodium chloride 0.9 % 100 mL IVPB  Status:  Discontinued        2 g 200 mL/hr over 30 Minutes Intravenous Every 12 hours 11/05/20 0757 11/05/20 1704   11/05/20 1745  piperacillin-tazobactam (ZOSYN) IVPB 3.375 g  Status:  Discontinued        3.375 g 12.5 mL/hr over 240 Minutes Intravenous Every 8 hours 11/05/20 1720 11/09/20 1015   11/05/20 0730  ceFEPIme (MAXIPIME) 2 g in sodium chloride 0.9 % 100 mL IVPB        2 g 200 mL/hr over 30 Minutes Intravenous  Once 11/05/20 0716 11/05/20 0814   11/05/20 0730  metroNIDAZOLE (FLAGYL) IVPB 500 mg        500 mg 100 mL/hr over 60 Minutes Intravenous  Once 11/05/20 0716 11/05/20 0850   11/05/20 0730  vancomycin (VANCOCIN) IVPB 1000 mg/200 mL premix        1,000 mg 200 mL/hr over 60 Minutes Intravenous  Once 11/05/20 0716 11/05/20 0914      Medications: Scheduled Meds: . apixaban  5 mg Oral BID  . aspirin EC  81 mg Oral Daily  . Chlorhexidine Gluconate Cloth  6 each Topical Daily  . diltiazem  30 mg Oral Q6H  . feeding supplement  1 Container Oral TID BM  . fluticasone furoate-vilanterol  1 puff Inhalation Daily  . loratadine  10 mg Oral Daily  .  melatonin  3 mg Oral QHS  . nystatin  5 mL Oral QID  . rosuvastatin  20 mg Oral Daily  . umeclidinium bromide  1 puff Inhalation Daily   Continuous Infusions: . sodium chloride Stopped (11/05/20 0847)  . penicillin g continuous IV infusion     PRN Meds:.sodium chloride, acetaminophen, diphenhydrAMINE, docusate sodium, fentaNYL (SUBLIMAZE) injection, haloperidol lactate, ipratropium-albuterol, LORazepam, polyethylene glycol    Objective: Weight change:   Intake/Output Summary (Last 24 hours) at 11/11/2020 1205 Last data filed at 11/11/2020 0930 Gross per 24 hour  Intake 600 ml  Output 2 ml  Net 598 ml   Blood pressure 111/73, pulse 84, temperature 98.4 F (36.9 C), temperature source Oral, resp. rate (!) 24, height 5\' 3"  (1.6 m), weight 78.2 kg, SpO2 99 %. Temp:  [97.7 F (36.5 C)-98.4 F (36.9 C)] 98.4 F (36.9 C) (03/22 0728) Pulse Rate:  [47-114] 84 (03/22 0930) Resp:  [20-39] 24 (03/22 0930) BP: (100-166)/(47-88) 111/73 (03/22 0930) SpO2:  [92 %-100 %] 99 % (03/22 0930) FiO2 (%):  [50 %] 50 % (03/22 0004)  Physical Exam:  Physical Exam Constitutional:      General: She is not in acute distress.    Appearance: She is not diaphoretic.  HENT:     Head: Normocephalic and atraumatic.     Right Ear: External ear normal.     Left Ear: External ear normal.     Mouth/Throat:     Pharynx: No oropharyngeal exudate.  Eyes:     General: No scleral icterus.    Conjunctiva/sclera: Conjunctivae normal.     Pupils: Pupils are equal, round, and reactive to light.  Cardiovascular:     Rate and Rhythm: Normal rate. Rhythm irregular.     Heart sounds: Murmur heard.  No friction rub. No gallop.   Pulmonary:     Effort: Pulmonary effort is normal. No respiratory distress.     Breath sounds: Normal breath sounds. No stridor. No wheezing, rhonchi or rales.  Abdominal:     General: Bowel sounds are normal. There is no distension.     Palpations: Abdomen is soft.     Tenderness:  There is no abdominal tenderness. There is no rebound.  Musculoskeletal:        General: No tenderness. Normal range of motion.  Lymphadenopathy:     Cervical: No cervical adenopathy.  Skin:    General: Skin is warm and dry.     Coloration: Skin is pale.     Findings: No erythema or rash.  Neurological:     General: No focal deficit present.     Mental Status: She is oriented to person, place, and time.     Motor: No abnormal muscle tone.     Coordination: Coordination normal.  Psychiatric:        Mood and Affect: Mood normal.        Behavior: Behavior normal.        Thought Content: Thought content normal.        Judgment: Judgment normal.      CBC:    BMET Recent Labs    11/10/20 1019 11/11/20 0101  NA 129* 132*  K 3.6 3.8  CL 102 104  CO2 21* 22  GLUCOSE 231* 151*  BUN 6* 7*  CREATININE 0.82 0.75  CALCIUM 7.7* 8.1*     Liver Panel  Recent Labs    11/09/20 0114 11/10/20 0215  PROT 5.5* 5.4*  ALBUMIN 1.9*  1.9* 1.9*  AST 21 34  ALT 27 34  ALKPHOS 93 86  BILITOT 1.3* 1.3*  BILIDIR 0.4* 0.3*  IBILI 0.9 1.0*       Sedimentation Rate No results for input(s): ESRSEDRATE in the last 72 hours. C-Reactive Protein No results for input(s): CRP in the last 72 hours.  Micro Results: Recent Results (from the past 720 hour(s))  Resp Panel by RT-PCR (Flu A&B, Covid) Nasopharyngeal Swab     Status: None   Collection Time: 11/05/20  7:19 AM   Specimen: Nasopharyngeal Swab; Nasopharyngeal(NP) swabs in vial transport medium  Result Value Ref Range Status   SARS Coronavirus 2 by RT PCR NEGATIVE NEGATIVE Final    Comment: (NOTE) SARS-CoV-2 target nucleic acids are NOT DETECTED.  The SARS-CoV-2 RNA is generally detectable in upper respiratory specimens during the acute phase of infection. The lowest concentration of SARS-CoV-2 viral copies this assay can detect is 138 copies/mL. A negative result does not preclude SARS-Cov-2 infection and should not be used  as the sole basis for treatment or other patient management decisions. A negative result may occur with  improper specimen  collection/handling, submission of specimen other than nasopharyngeal swab, presence of viral mutation(s) within the areas targeted by this assay, and inadequate number of viral copies(<138 copies/mL). A negative result must be combined with clinical observations, patient history, and epidemiological information. The expected result is Negative.  Fact Sheet for Patients:  EntrepreneurPulse.com.au  Fact Sheet for Healthcare Providers:  IncredibleEmployment.be  This test is no t yet approved or cleared by the [place] FDA and  has been authorized for detection and/or diagnosis of SARS-CoV-2 by FDA under an Emergency Use Authorization (EUA). This EUA will remain  in effect (meaning this test can be used) for the duration of the COVID-19 declaration under Section 564(b)(1) of the Act, 21 U.S.C.section 360bbb-3(b)(1), unless the authorization is terminated  or revoked sooner.       Influenza A by PCR NEGATIVE NEGATIVE Final   Influenza B by PCR NEGATIVE NEGATIVE Final    Comment: (NOTE) The Xpert Xpress SARS-CoV-2/FLU/RSV plus assay is intended as an aid in the diagnosis of influenza from Nasopharyngeal swab specimens and should not be used as a sole basis for treatment. Nasal washings and aspirates are unacceptable for Xpert Xpress SARS-CoV-2/FLU/RSV testing.  Fact Sheet for Patients: EntrepreneurPulse.com.au  Fact Sheet for Healthcare Providers: IncredibleEmployment.be  This test is not yet approved or cleared by the [place] FDA and has been authorized for detection and/or diagnosis of SARS-CoV-2 by FDA under an Emergency Use Authorization (EUA). This EUA will remain in effect (meaning this test can be used) for the duration of the COVID-19 declaration under Section 564(b)(1)  of the Act, 21 U.S.C. section 360bbb-3(b)(1), unless the authorization is terminated or revoked.  Performed at [facility], [address] [place], [place] [address]   Urine culture     Status: None   Collection Time: 11/05/20  7:26 AM   Specimen: In/Out Cath Urine  Result Value Ref Range Status   Specimen Description   Final    IN/OUT CATH URINE Performed at [facility], [address] [place], [place] [address]    Special Requests   Final    NONE Performed at [facility], [address] [place], [place] [address]    Culture   Final    NO GROWTH Performed at [facility], [place] [address] [place], [place] [address]    Report Status 11/06/2020 FINAL  Final  Blood Culture (routine x 2)     Status: Abnormal   Collection Time: 11/05/20  7:27 AM   Specimen: BLOOD RIGHT HAND  Result Value Ref Range Status   Specimen Description   Final    BLOOD RIGHT HAND Performed at [facility], [address] [place], [place] [address]    Special Requests   Final    BOTTLES DRAWN AEROBIC AND ANAEROBIC Blood Culture adequate volume Performed at [facility] [address] [place], [place] [address]    Culture  Setup Time   Final    GRAM POSITIVE RODS IN BOTH AEROBIC AND ANAEROBIC BOTTLES CRITICAL RESULT CALLED TO, READ BACK BY AND VERIFIED WITH: PHARMD HALEY B. 1936 [id number] FCP    Culture (A)  Final    ACTINOMYCES NEUII Standardized susceptibility testing for this organism is not available. Performed at [facility], [place] [address], [place], [place] [address]    Report Status 11/09/2020 FINAL  Final  MRSA PCR Screening     Status: None  Collection Time: 11/05/20 11:19 AM   Specimen: Nasal Mucosa; Nasopharyngeal  Result Value Ref Range Status   MRSA by PCR NEGATIVE NEGATIVE Final    Comment:        The GeneXpert MRSA Assay (FDA approved for NASAL specimens only), is one component of a comprehensive MRSA  colonization surveillance program. It is not intended to diagnose MRSA infection nor to guide or monitor treatment for MRSA infections. Performed at [facility], [place] [address], [place], [place] [address]   Culture, blood (routine x 2)     Status: None (Preliminary result)   Collection Time: 11/09/20 10:16 AM   Specimen: BLOOD LEFT HAND  Result Value Ref Range Status   Specimen Description BLOOD LEFT HAND  Final   Special Requests   Final    BOTTLES DRAWN AEROBIC ONLY Blood Culture results may not be optimal due to an inadequate volume of blood received in culture bottles   Culture   Final    NO GROWTH 2 DAYS Performed at [facility], [place] [address], [place], [place] [address]    Report Status PENDING  Incomplete  Culture, blood (routine x 2)     Status: None (Preliminary result)   Collection Time: 11/09/20 10:24 AM   Specimen: BLOOD RIGHT HAND  Result Value Ref Range Status   Specimen Description BLOOD RIGHT HAND  Final   Special Requests   Final    BOTTLES DRAWN AEROBIC ONLY Blood Culture adequate volume   Culture   Final    NO GROWTH 2 DAYS Performed at [facility], [place] [address], [place],  [address]    Report Status PENDING  Incomplete    Studies/Results: CT CHEST ABDOMEN PELVIS W CONTRAST  Addendum Date: 11/10/2020   ADDENDUM REPORT: 11/10/2020 00:29 ADDENDUM: Additional impression point: New edematous changes and skin thickening across the mons pubis. Possibly related to some developing body wall edema though recommend correlation with direct visualization to exclude cellulitis. Addendum will be called to the ordering clinician or representative by the Radiologist Assistant, and communication documented in the PACS or [employer]. Electronically Signed   By: Lovena Le M.D.   On: 11/10/2020 00:29   Result Date: 11/10/2020 CLINICAL DATA:  Sepsis, history of COPD, hypertension, bioprosthetic aortic valve replacement, CAD and carcinoid  lung cancer post lobectomy EXAM: CT CHEST, ABDOMEN, AND PELVIS WITH CONTRAST TECHNIQUE: Multidetector CT imaging of the chest, abdomen and pelvis was performed following the standard protocol during bolus administration of intravenous contrast. CONTRAST:  3mL OMNIPAQUE IOHEXOL 300 MG/ML  SOLN COMPARISON:  CT 11/05/2020 FINDINGS: CT CHEST FINDINGS Cardiovascular: Cardiac size is top normal. No sizable pericardial effusion, abnormal thickening or pericardial calcification. Coronary artery calcifications are present. Post TAVR. Mitral annular calcifications are seen. Prior sternotomy and CABG. Atherosclerotic plaque within the normal caliber aorta. Calcifications present in the proximal great vessels with shared origin of brachiocephalic and left common carotid artery. No acute abnormality the aorta or proximal great vessels. Central pulmonary arteries are normal caliber. No large central filling defects within the limitations of this non tailored examination of the pulmonary arteries. Mediastinum/Nodes: Postsurgical changes anterior mediastinum related to prior sternotomy and CABG. No mediastinal fluid or gas. Normal thyroid gland and thoracic inlet. No acute abnormality of the trachea or esophagus. No worrisome mediastinal, hilar or axillary adenopathy. Surgical material in the left hilum likely related prior lobectomy. Lungs/Pleura: Prior left lower lobectomy with relative hyperexpansion of the left upper lobe. Redemonstration of the  scattered small pulmonary nodules in both lungs, largest in the left upper lobe measuring up to 7 mm in size (5/76). No new conspicuous or enlarging pulmonary nodules are seen. There are small bilateral pleural effusions with slightly lobular margins which could reflect some underlying loculation. Adjacent areas of passive atelectasis in both lung bases. Underlying airspace disease is difficult to fully exclude. Some mild interlobular septal thickening is noted towards the lung apices.  Mild pulmonary vascular redistribution is noted as well. Minimal airways thickening and scattered secretions. Musculoskeletal: Prior sternotomy with intact and aligned sternal sutures. No aggressive lytic or blastic lesions. Exaggerated thoracic kyphosis and mild dextrocurvature of the spine. Multilevel degenerative changes are present in the imaged portions of the spine. Additional degenerative changes in the shoulders. CT ABDOMEN PELVIS FINDINGS Hepatobiliary: Hypoattenuation adjacent the falciform ligament likely reflecting some regions of focal fatty infiltration. More diffuse hepatic hypoattenuation likely reflecting more mild global hepatic steatosis. Smooth liver surface contour. No concerning focal liver lesion. Gallbladder appears surgically absent. Slight prominence of the biliary tree is similar to comparison imaging and likely reflect some post cholecystectomy reservoir effect. Pancreas: No pancreatic ductal dilatation or surrounding inflammatory changes. Spleen: Normal in size. No concerning splenic lesions. Adrenals/Urinary Tract: Normal adrenal glands. The kidneys enhance and excrete symmetrically. Redemonstration of an exophytic, lobular fluid attenuation cyst arising from the upper pole right kidney. Additional subcentimeter hypoattenuating foci are present in both kidneys too small to fully characterize on CT imaging but statistically likely benign. Bilateral extrarenal pelves are again seen. No concerning renal mass, obstructive urolithiasis or hydronephrosis. Stomach/Bowel: Small hiatal hernia. Stomach is otherwise unremarkable. The previously seen inflammatory changes centered upon the duodenum near the porta hepatis/gallbladder fossa have significantly diminished from the comparison exam. No extraluminal gas, organized abscess or collection is seen. Noninflamed small air-filled duodenal diverticulum near the ligament of Treitz (6/57). Normal sweep across the midline abdomen. No small bowel  thickening or dilatation. No evidence of obstruction. No colonic dilatation or wall thickening. Scattered colonic diverticula without focal inflammation to suggest diverticulitis. Vascular/Lymphatic: Atherosclerotic calcifications within the abdominal aorta and branch vessels. No aneurysm or ectasia. No enlarged abdominopelvic lymph nodes. Reproductive: Normal appearance of the uterus and adnexal structures. Other: Mild body wall edema most pronounced in the posterior midline and flanks. Some slightly more focal edematous change in skin thickening noted along the mons pubis, new from prior. Tiny fat containing umbilical hernia. No bowel containing hernias. No free air or fluid. Notable resolution of the previously seen perihepatic ascites. No organized abscess or collection. Musculoskeletal: No acute osseous abnormality or suspicious osseous lesion. Stable hemangioma L1. Multilevel degenerative changes are present in the imaged portions of the spine. Additional degenerative changes in the hips and pelvis. IMPRESSION: 1. The previously seen inflammatory changes centered upon the duodenum near the porta hepatis/gallbladder fossa have significantly diminished from the comparison exam. No extraluminal gas, organized abscess or collection is seen. 2. Small bilateral pleural effusions with slightly lobular margins which could reflect some underlying loculation. Adjacent areas of passive atelectasis in both lung bases. Underlying airspace disease is difficult to fully exclude. 3. Mild pulmonary vascular redistribution, suggestive of mild interstitial edema. 4. Stable appearance of the scattered small pulmonary nodules in both lungs, largest in the left upper lobe measuring up to 7 mm in size. 5. Colonic diverticulosis without evidence of diverticulitis. 6. Hepatic steatosis. 7. Coronary atherosclerosis with evidence of prior sternotomy, CABG and TAVR. 8. Aortic Atherosclerosis (ICD10-I70.0). Electronically Signed: By:  Elwin Sleight.D.  On: 11/09/2020 23:13   [place] EKG SITE RITE  Result Date: 11/11/2020 If [facility] image not attached, placement could not be confirmed due to current cardiac rhythm.     Assessment/Plan:  INTERVAL HISTORY: TEE is negative   Principal Problem:   Actinomyces infection Active Problems:   S/P AVR (aortic valve replacement)   Acute respiratory failure with hypoxia (HCC)   Lactic acidosis   Elevated troponin   Atrial fibrillation with RVR (HCC)   Severe sepsis with septic shock (HCC)   Malnutrition of moderate degree   Acute renal failure ([place])   Bacteremia   Fatigue    Tracy Buck is a 86 y.o. 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  CT scan shows possible duodenitis.  She has been narrowed from Zosyn to Unasyn.  2D echocardiogram showed crease gradient across the aortic valve as well as worsening tricuspid and mitral regurgitation  Repeat cultures are negative so far.  TEE is negative for vegetations  I will get CT maxillofacial study ordered today  Change to PCN today  We will order PICC line placement  Iam tentatively planning on giving her a month of continuous penicillin followed by oral amoxicillin TO reach 6 months to a year of therapy.  Thrush: We will start nystatin swish and swallow  Tracy Buck has an appointment on   The [facility] is located in the [facility] at  [address] in [place].  [address], which is located to the left of the elevators.  Phone: [phone]  Fax: [phone]  [email or web address]  I spent greater than 35 minutes with the patient including greater than 50% of time in face to face counsel of the patient and her daughter re the nature of actinomyces infections and  in coordination of her care.   Steve Gregg has an appointment on 11/28/2020 at 1045 AM with Dr. Tommy Medal  The [facility] is located in the [facility] at  [place] in [place].  [address], which is located to the left of the elevators.  Phone: [phone]  Fax: [phone]  [email or web address]  She should arrive 15 minutes piror to her appointment.   LOS: 6 days   Alcide Evener 11/11/2020, 12:05 PM

## 2020-11-11 NOTE — Progress Notes (Signed)
RT NOTES: Called to room by RN d/t patient stating she is short of breath. Went to room to assess patient and patient was asleep and resting comfortably in chair. Sats 98%, HR 87, RR 20. Family member at bedside seems to think it was a panic attack. Will continue to monitor patient.

## 2020-11-11 NOTE — Progress Notes (Signed)
PT Cancellation Note  Patient Details Name: Tracy Buck MRN: [id number] DOB: Apr 28, 1935   Cancelled Treatment:    Reason Eval/Treat Not Completed: Fatigue/lethargy limiting ability to participate  Recently back from TEE. Arousable but very groggy and currently unsafe to attempt ambulation.   Arby Barrette, PT Pager [phone]  Rexanne Mano 11/11/2020, 10:17 AM

## 2020-11-11 NOTE — CV Procedure (Signed)
Brief TEE Note  LVEF 55% TAVR aortic valve mean gradient 8 mmHg No significant AR Severe TR Mild-moderate MR.  No pulmonary vein flow reversal. Degenerative mitral valve with leaflet thickening and MAC but no evidence of endocarditis Small pleural effusion No LA/LAA thrombus or masses  For additional details see full report.  Tracy Stetler C. Oval Linsey, MD, [facility] 11/11/2020 8:43 AM

## 2020-11-11 NOTE — Procedures (Signed)
Thoracentesis  Procedure Note  Harla Mensch  [id number]  1934/10/12  Date:11/11/20  Time:3:48 PM   Provider Performing:Aizlynn Digilio C Tamala Julian   Procedure: Thoracentesis with imaging guidance (97026)  Indication(s) Pleural Effusion  Consent Risks of the procedure as well as the alternatives and risks of each were explained to the patient and/or caregiver.  Consent for the procedure was obtained and is signed in the bedside chart  Anesthesia Topical only with 1% lidocaine    Time Out Verified patient identification, verified procedure, site/side was marked, verified correct patient position, special equipment/implants available, medications/allergies/relevant history reviewed, required imaging and test results available.   Sterile Technique Maximal sterile technique including full sterile barrier drape, hand hygiene, sterile gown, sterile gloves, mask, hair covering, sterile ultrasound probe cover (if used).  Procedure Description Ultrasound was used to identify appropriate pleural anatomy for placement and overlying skin marked.  Area of drainage cleaned and draped in sterile fashion. Lidocaine was used to anesthetize the skin and subcutaneous tissue.  600 cc's of amber appearing fluid was drained from the left pleural space. Catheter then removed and bandaid applied to site.   Complications/Tolerance None; patient tolerated the procedure well. Chest X-ray is ordered to confirm no post-procedural complication.   EBL Minimal   Specimen(s) Pleural fluid

## 2020-11-11 NOTE — Anesthesia Preprocedure Evaluation (Addendum)
Anesthesia Evaluation  Patient identified by MRN, date of birth, ID band Patient awake    Reviewed: Allergy & Precautions, H&P , NPO status , Patient's Chart, lab work & pertinent test results  Airway Mallampati: II   Neck ROM: full    Dental   Pulmonary COPD,  H/o lung CA   breath sounds clear to auscultation       Cardiovascular hypertension, + Past MI  + dysrhythmias Atrial Fibrillation + Valvular Problems/Murmurs  Rhythm:regular Rate:Normal  S/p TAVR (04/2020) TTE (11/05/20): mod MR, mod TR, EF 50%. Prosthetic aortic valve seen. Vmax 2.8 m/s   Neuro/Psych PSYCHIATRIC DISORDERS Anxiety    GI/Hepatic   Endo/Other    Renal/GU      Musculoskeletal  (+) Arthritis ,   Abdominal   Peds  Hematology   Anesthesia Other Findings   Reproductive/Obstetrics                          Anesthesia Physical Anesthesia Plan  ASA: III  Anesthesia Plan: MAC   Post-op Pain Management:    Induction: Intravenous  PONV Risk Score and Plan: 2 and Propofol infusion and Treatment may vary due to age or medical condition  Airway Management Planned: Nasal Cannula  Additional Equipment:   Intra-op Plan:   Post-operative Plan:   Informed Consent: I have reviewed the patients History and Physical, chart, labs and discussed the procedure including the risks, benefits and alternatives for the proposed anesthesia with the patient or authorized representative who has indicated his/her understanding and acceptance.     Dental advisory given  Plan Discussed with: CRNA, Anesthesiologist and Surgeon  Anesthesia Plan Comments:         Anesthesia Quick Evaluation

## 2020-11-11 NOTE — Anesthesia Procedure Notes (Signed)
Procedure Name: MAC Date/Time: 11/11/2020 8:10 AM Performed by: Amadeo Garnet, CRNA Pre-anesthesia Checklist: Patient identified, Emergency Drugs available, Suction available and Patient being monitored Patient Re-evaluated:Patient Re-evaluated prior to induction Oxygen Delivery Method: Nasal cannula Preoxygenation: Pre-oxygenation with 100% oxygen Induction Type: IV induction Placement Confirmation: positive ETCO2 Dental Injury: Teeth and Oropharynx as per pre-operative assessment

## 2020-11-11 NOTE — Progress Notes (Signed)
   11/11/20 0004  BiPAP/CPAP/SIPAP  $ Non-Invasive Ventilator  Non-Invasive Vent Subsequent  BiPAP/CPAP/SIPAP Pt Type Adult  Mask Type Full face mask  Mask Size Medium  Set Rate 14 breaths/min  Respiratory Rate 39 breaths/min  IPAP 12 cmH20  EPAP 6 cmH2O  Oxygen Percent 50 %  Minute Ventilation 15.3  Leak 0  Peak Inspiratory Pressure (PIP) 15  Tidal Volume (Vt) 455  BiPAP/CPAP/SIPAP BiPAP  Patient Home Equipment No  Auto Titrate No  Press High Alarm 25 cmH2O  Press Low Alarm 5 cmH2O  BiPAP/CPAP /SiPAP Vitals  Pulse Rate (!) 114  Resp (!) 39  SpO2 99 %  Bilateral Breath Sounds Diminished  MEWS Score/Color  MEWS Score 5  MEWS Score Color Red  Rt called to bedside placed pt. on bipap for increased WOB, pt. Is also complaining of chest pain, pt. RR remains in the high 30s ,163/132, and HR 114. Pt. Is very restless. Rapid called and RN aware

## 2020-11-12 ENCOUNTER — Inpatient Hospital Stay (HOSPITAL_COMMUNITY): Payer: Medicare Other

## 2020-11-12 DIAGNOSIS — J810 Acute pulmonary edema: Secondary | ICD-10-CM

## 2020-11-12 DIAGNOSIS — J9601 Acute respiratory failure with hypoxia: Secondary | ICD-10-CM | POA: Diagnosis not present

## 2020-11-12 DIAGNOSIS — N179 Acute kidney failure, unspecified: Secondary | ICD-10-CM | POA: Diagnosis not present

## 2020-11-12 DIAGNOSIS — Z95828 Presence of other vascular implants and grafts: Secondary | ICD-10-CM

## 2020-11-12 DIAGNOSIS — A429 Actinomycosis, unspecified: Secondary | ICD-10-CM | POA: Diagnosis not present

## 2020-11-12 DIAGNOSIS — J90 Pleural effusion, not elsewhere classified: Secondary | ICD-10-CM

## 2020-11-12 DIAGNOSIS — Z9889 Other specified postprocedural states: Secondary | ICD-10-CM

## 2020-11-12 DIAGNOSIS — A419 Sepsis, unspecified organism: Secondary | ICD-10-CM | POA: Diagnosis not present

## 2020-11-12 LAB — CBC
HCT: 33 % — ABNORMAL LOW (ref 36.0–46.0)
Hemoglobin: 11.1 g/dL — ABNORMAL LOW (ref 12.0–15.0)
MCH: 30.2 pg (ref 26.0–34.0)
MCHC: 33.6 g/dL (ref 30.0–36.0)
MCV: 89.9 fL (ref 80.0–100.0)
Platelets: 250 10*3/uL (ref 150–400)
RBC: 3.67 MIL/uL — ABNORMAL LOW (ref 3.87–5.11)
RDW: 14.8 % (ref 11.5–15.5)
WBC: 9 10*3/uL (ref 4.0–10.5)
nRBC: 0 % (ref 0.0–0.2)

## 2020-11-12 MED ORDER — DILTIAZEM HCL ER COATED BEADS 120 MG PO CP24: 120.0000 mg | ORAL_CAPSULE | Freq: Every day | ORAL | Status: DC

## 2020-11-12 MED ORDER — SODIUM CHLORIDE 0.9% FLUSH: 10.0000 mL | INTRAVENOUS | Status: DC | PRN

## 2020-11-12 MED ORDER — DILTIAZEM HCL 30 MG PO TABS
30.0000 mg | ORAL_TABLET | Freq: Four times a day (QID) | ORAL | Status: AC
  Administered 2020-11-12 – 2020-11-15 (×11): 30 mg via ORAL
  Filled 2020-11-12 (×11): qty 1

## 2020-11-12 MED ORDER — FUROSEMIDE 10 MG/ML IJ SOLN
60.0000 mg | Freq: Two times a day (BID) | INTRAMUSCULAR | Status: AC
  Administered 2020-11-12 (×2): 60 mg via INTRAVENOUS
  Filled 2020-11-12 (×2): qty 6

## 2020-11-12 MED ORDER — SODIUM CHLORIDE 0.9% FLUSH
10.0000 mL | Freq: Two times a day (BID) | INTRAVENOUS | Status: DC
  Administered 2020-11-12 – 2020-11-17 (×8): 10 mL

## 2020-11-12 NOTE — Progress Notes (Signed)
NAME:  Cesily Cuoco, MRN:  [id number], DOB:  February 03, 1935, LOS: 7 ADMISSION DATE:  11/05/2020, CONSULTATION DATE:  3/16 REFERRING MD:  Dr Billy Fischer EDP, CHIEF COMPLAINT:  Sepsis  Brief History:  86 year old female admitted 3/16 with concerns of sepsis with unclear source and requiring BiPAP for hypoxia while at [facility]. Transferred to [facility] on empiric antibiotics. She was felt to have sepsis secondary to duodenitis after imaging was done. Hypotensive but resolved spontaneously resolved. Dyspnea improved with BiPAP. She was ultimately transferred out to the floor. Blood cultures grew actinomyces. Bacteremia workup initiated including TEE on 3/22 without clear vegetation. Overnight 3/21 >3/22 she developed tachypnea felt to represent anxiety, which did not improved with ativan/haldol administration. PCCM asked to see on 3/22.   Past Medical History:   has a past medical history of Arthritis, Complication of anesthesia, COPD (chronic obstructive pulmonary disease) ([place]), Heart valve replaced, Hyperlipidemia, Hypertension, LBBB (left bundle branch block), Lung cancer ([place]) (2013), Lung mass, Myocardial infarct ([place]), Panic attack, Pneumonia, Rib fracture, and Skin cancer of nose.   Significant Hospital Events:  3/16 admit to ICU with working diagnosis sepsis 3/17> to floor 3/20> ID consult for Actinomyces 3/22 PCCM reconsulted for worsening hypoxia> thoracentesis with removal of 600cc on L, diuresis to address R effusion   Consults:  Cardiology ID  Procedures:  TEE 3/22 >>> severe TR. Mild to moderate MR, preserved EF L thora 3/22> mildly elevated LDH, 293 WBCs (44% PMNs)  Significant Diagnostic Tests:  CT maxillofacial 3/22 >>> no active sinus disease  Micro Data:  Blood 3/16 > Actinomyces neuii Urine 3/16 > NG 3/20 blood>> Left pleural fluid 3/22>   Antimicrobials:  Flagyl 3/16 ED Cefepime 3/16 > Vancomycin 3/16 >  Interim History / Subjective:  Thora yesterday-- breathing feeling  since this. Was walking around with PT and was able to walk more than yesterday. Still some swelling in arms and legs.  Objective   Blood pressure 125/65, pulse 87, temperature 98.4 F (36.9 C), temperature source Axillary, resp. rate 20, height 5\' 3"  (1.6 m), weight 78.2 kg, SpO2 99 %.        Intake/Output Summary (Last 24 hours) at 11/12/2020 0733 Last data filed at 11/12/2020 4403 Gross per 24 hour  Intake 340 ml  Output 2400 ml  Net -2060 ml   Filed Weights   11/05/20 0707 11/05/20 1002 11/07/20 0500  Weight: 76.2 kg 78.2 kg 78.2 kg    Examination: General:  Chronically ill appearing elderly woman sitting up in the chair.  Neuro: awake, alert, answering questions appropriately HEENT:  [place]/AT, eyes anicteric Cardiovascular:  S1S2, irreg rhythm Lungs:  No wheezing, rhales in R base, no tachypnea or accessory muscle use. Abdomen:  Soft, NT, ND Musculoskeletal:  Edema persistent in LE more than UEs. No cyanosis. Skin:  Bruising, thin skin. No rashes.  Pleural fluid: LDH 57 Protein <3g WBC 293 (44% PMNs)   Resolved Hospital Problem list   AKI  Assessment & Plan:   Bacteremia- actinomyces. TEE negative for vegetations on 3/22. No evidence of sinus disease on CT. - Antibiotics per ID team  - Will need PICC for long term therapy - pleural cultures pending; 3/20 repeat blood cx pending  Acute respiratory failure with hypoxia; acute respiratory distress resolved post-thora on L and with diuresis.  Acute pulmonary edema R pleural effusion likely due to hypervolemia Loculated left-sided weakly exudative effusion likely from previous infection, but relatively low WBC count suggests against an active infection. This had likely  enlarged due to hypervolemia - Con't diuresis; strict I/Os.  - supplemental O2 to maintain SpO2 >90%, wean as tolerated - OOB mobility, IS, pulmonary hygiene - follow pleural fluid cultures until finalized  Acute metabolic encephalopathy> better  today -delirium precautions  Echo changes- worsened MR, severe TR Hx CABG, TAVR Afib w/ RVR, now rate controlled - management per primary; on apixaban  Questionable COPD diagnosis; no PFTs  - resume home LABA/LAMA/ICS - Breo & Incruse  Allergies, HTN, HLD -per primary team  PCCM will continue to follow intermittently. Please call in the interim with questions.    Best practice (evaluated daily)  Per primary   Julian Hy, DO 11/12/20 3:17 PM [facility]  From 7AM- 7PM if no response to pager, please call [phone]. After hours, 7PM- 7AM, please call Elink  [phone].

## 2020-11-12 NOTE — Progress Notes (Signed)
Physical Therapy Treatment Patient Details Name: Tracy Buck MRN: [id number] DOB: April 11, 1935 Today's Date: 11/12/2020    History of Present Illness 86 yo admitted to [facility] 3/16 with sepsis and hypoxia requiring Bipap with transfer to [facility] pt with Afib with RVR. 3/22 Moderate severe MR and TR by TEE; 3/22  L thoracentesis  PMhx: COPD, HTN, TAVR, CAD, lung CA s/p lobectomy, HLD    PT Comments    Patient s/p thoracentesis and dyspnea much improved this session. Sats on 3L >95% throughout session. Session focused on transfers with and without RW and ambulation with RW.    Follow Up Recommendations  Home health PT;Supervision/Assistance - 24 hour     Equipment Recommendations  None recommended by PT    Recommendations for Other Services OT consult     Precautions / Restrictions Precautions Precautions: Fall Precaution Comments: monitor sats    Mobility  Bed Mobility Overal bed mobility: Needs Assistance Bed Mobility: Rolling;Sidelying to Sit Rolling: Min assist Sidelying to sit: Min assist       General bed mobility comments: pt reaching for assist from supine; educated on rolling and side to sit    Transfers Overall transfer level: Needs assistance Equipment used: Rolling walker (2 wheeled);None Transfers: Sit to/from [employer] to Stand: Min guard Stand pivot transfers: Min assist       General transfer comment: transfer bed to [facility] to recliner with no device (very small space); from recliner with RW with good sequecing to stand and cues for hand placment to sit  Ambulation/Gait Ambulation/Gait assistance: Min assist Gait Distance (Feet): 40 Feet Assistive device: Rolling walker (2 wheeled) Gait Pattern/deviations: Step-through pattern;Decreased stride length;Wide base of support;Trunk flexed Gait velocity: decreased   General Gait Details: slow, guarded gait   Stairs             Wheelchair Mobility    Modified  [place] (Stroke Patients Only)       Balance Overall balance assessment: Needs assistance   Sitting balance-Leahy Scale: Fair Sitting balance - Comments: pt able to sit EOB without UE support   Standing balance support: During functional activity;No upper extremity supported Standing balance-Leahy Scale: Fair                              Cognition Arousal/Alertness: Awake/alert Behavior During Therapy: WFL for tasks assessed/performed Overall Cognitive Status: Within Functional Limits for tasks assessed                                        Exercises      General Comments        Pertinent Vitals/Pain Pain Assessment: No/denies pain    Home Living                      Prior Function            PT Goals (current goals can now be found in the care plan section) Acute Rehab PT Goals Patient Stated Goal: home Time For Goal Achievement: 11/20/20 Potential to Achieve Goals: Good Progress towards PT goals: Progressing toward goals    Frequency    Min 3X/week      PT Plan Current plan remains appropriate    Co-evaluation              AM-PAC PT "  6 Clicks" Mobility   Outcome Measure  Help needed turning from your back to your side while in a flat bed without using bedrails?: A Little Help needed moving from lying on your back to sitting on the side of a flat bed without using bedrails?: A Little Help needed moving to and from a bed to a chair (including a wheelchair)?: A Little Help needed standing up from a chair using your arms (e.g., wheelchair or bedside chair)?: A Little Help needed to walk in hospital room?: A Little Help needed climbing 3-5 steps with a railing? : A Lot 6 Click Score: 17    End of Session Equipment Utilized During Treatment: Gait belt;Oxygen Activity Tolerance: Patient tolerated treatment well Patient left: in chair;with call bell/phone within reach Nurse Communication: Mobility status;Other  (comment) (?need purewick) PT Visit Diagnosis: Other abnormalities of gait and mobility (R26.89);Difficulty in walking, not elsewhere classified (R26.2);Muscle weakness (generalized) (M62.81)     Time: 8850-2774 PT Time Calculation (min) (ACUTE ONLY): 33 min  Charges:  $Gait Training: 23-37 mins                      Arby Barrette, PT Pager [phone]    Rexanne Mano 11/12/2020, 12:11 PM

## 2020-11-12 NOTE — TOC Progression Note (Signed)
Transition of Care [facility]) - Progression Note    Patient Details  Name: Renlee Floor MRN: [id number] Date of Birth: 03-08-1935  [facility] CM/SW Contact  Joanne Chars, LCSW Phone Number: 11/12/2020, 3:15 PM  Clinical Narrative:   CSW spoke with pt daughter Levada Dy regarding IV abx and the plan for [facility].  She had questions about 24/7 supervision but still wants to move forward with this plan.  CSW spoke with Carolynn Sayers at [facility] [place] infusion and she already has the referral from ID.  CSW confirmed with Tommi Rumps at [facility] that they can take the case with the new IV abx.     Expected Discharge Plan: [place] Barriers to Discharge: Continued Medical Work up  Expected Discharge Plan and Services Expected Discharge Plan: [place] In-house Referral: Clinical Social Work   Post Acute Care Choice: [place] arrangements for the past 2 months: [place]: PT [place]: [place] Date [place]: 11/07/20 Time [place]: 0347 Representative spoke with at [place]: [place] ([place]) Interventions    Readmission Risk Interventions No flowsheet data found.

## 2020-11-12 NOTE — Progress Notes (Signed)
Peripherally Inserted Central Catheter Placement  The IV Nurse has discussed with the patient and/or persons authorized to consent for the patient, the purpose of this procedure and the potential benefits and risks involved with this procedure.  The benefits include less needle sticks, lab draws from the catheter, and the patient may be discharged home with the catheter. Risks include, but not limited to, infection, bleeding, blood clot (thrombus formation), and puncture of an artery; nerve damage and irregular heartbeat and possibility to perform a PICC exchange if needed/ordered by physician.  Alternatives to this procedure were also discussed.  Bard Power PICC patient education guide, fact sheet on infection prevention and patient information card has been provided to patient /or left at bedside.    PICC Placement Documentation  PICC Single Lumen 46/80/32 PICC Right Basilic 34 cm 0 cm (Active)  Indication for Insertion or Continuance of Line Home intravenous therapies (PICC only) 11/12/20 1123  Exposed Catheter (cm) 0 cm 11/12/20 1123  Site Assessment Clean;Dry;Intact 11/12/20 1123  Line Status Flushed;Blood return noted 11/12/20 1123  Dressing Type Transparent 11/12/20 1123  Dressing Status Clean;Dry;Intact 11/12/20 1123  Antimicrobial disc in place? Yes 11/12/20 1123  Dressing Intervention New dressing;Other (Comment) 11/12/20 1123  Dressing Change Due 11/19/20 11/12/20 1123   Consent signed by daughter at bedside    Synthia Innocent 11/12/2020, 11:24 AM

## 2020-11-12 NOTE — Progress Notes (Signed)
Subjective: Patient feels better after undergoing thoracentesis yesterday.   Antibiotics:  Anti-infectives (From admission, onward)   Start     Dose/Rate Route Frequency Ordered Stop   11/11/20 1145  penicillin G potassium 12 Million Units in dextrose 5 % 500 mL continuous infusion        12 Million Units 41.7 mL/hr over 12 Hours Intravenous Every 12 hours 11/11/20 1045     11/09/20 1130  Ampicillin-Sulbactam (UNASYN) 3 g in sodium chloride 0.9 % 100 mL IVPB  Status:  Discontinued        3 g 200 mL/hr over 30 Minutes Intravenous Every 6 hours 11/09/20 1025 11/11/20 1045   11/06/20 0600  vancomycin (VANCOREADY) IVPB 750 mg/150 mL  Status:  Discontinued        750 mg 150 mL/hr over 60 Minutes Intravenous Every 24 hours 11/05/20 0757 11/06/20 0745   11/05/20 2000  ceFEPIme (MAXIPIME) 2 g in sodium chloride 0.9 % 100 mL IVPB  Status:  Discontinued        2 g 200 mL/hr over 30 Minutes Intravenous Every 12 hours 11/05/20 0757 11/05/20 1704   11/05/20 1745  piperacillin-tazobactam (ZOSYN) IVPB 3.375 g  Status:  Discontinued        3.375 g 12.5 mL/hr over 240 Minutes Intravenous Every 8 hours 11/05/20 1720 11/09/20 1015   11/05/20 0730  ceFEPIme (MAXIPIME) 2 g in sodium chloride 0.9 % 100 mL IVPB        2 g 200 mL/hr over 30 Minutes Intravenous  Once 11/05/20 0716 11/05/20 0814   11/05/20 0730  metroNIDAZOLE (FLAGYL) IVPB 500 mg        500 mg 100 mL/hr over 60 Minutes Intravenous  Once 11/05/20 0716 11/05/20 0850   11/05/20 0730  vancomycin (VANCOCIN) IVPB 1000 mg/200 mL premix        1,000 mg 200 mL/hr over 60 Minutes Intravenous  Once 11/05/20 0716 11/05/20 0914      Medications: Scheduled Meds: . apixaban  5 mg Oral BID  . aspirin EC  81 mg Oral Daily  . Chlorhexidine Gluconate Cloth  6 each Topical Daily  . diltiazem  30 mg Oral Q6H  . feeding supplement  1 Container Oral TID BM  . fluticasone furoate-vilanterol  1 puff Inhalation Daily  . furosemide  60 mg  Intravenous BID  . loratadine  10 mg Oral Daily  . melatonin  3 mg Oral QHS  . nystatin  5 mL Oral QID  . rosuvastatin  20 mg Oral Daily  . sodium chloride flush  10-40 mL Intracatheter Q12H  . umeclidinium bromide  1 puff Inhalation Daily   Continuous Infusions: . sodium chloride Stopped (11/05/20 0847)  . penicillin g continuous IV infusion 12 Million Units (11/12/20 1239)   PRN Meds:.sodium chloride, acetaminophen, diphenhydrAMINE, docusate sodium, fentaNYL (SUBLIMAZE) injection, haloperidol lactate, ipratropium-albuterol, LORazepam, polyethylene glycol, sodium chloride flush    Objective: Weight change:   Intake/Output Summary (Last 24 hours) at 11/12/2020 1304 Last data filed at 11/12/2020 8185 Gross per 24 hour  Intake 240 ml  Output 2400 ml  Net -2160 ml   Blood pressure 121/83, pulse 80, temperature 98 F (36.7 C), temperature source Oral, resp. rate 19, height 5' 3"  (1.6 m), weight 78.2 kg, SpO2 99 %. Temp:  [97.9 F (36.6 C)-98.7 F (37.1 C)] 98 F (36.7 C) (03/23 1154) Pulse Rate:  [70-96] 80 (03/23 1200) Resp:  [18-39] 19 (03/23 1154) BP: (108-125)/(64-97) 121/83 (03/23  1200) SpO2:  [98 %-100 %] 99 % (03/23 1154)  Physical Exam: Physical Exam Constitutional:      General: She is not in acute distress.    Appearance: She is not diaphoretic.  HENT:     Head: Normocephalic and atraumatic.     Right Ear: External ear normal.     Left Ear: External ear normal.     Mouth/Throat:     Pharynx: No oropharyngeal exudate.  Eyes:     General: No scleral icterus.    Conjunctiva/sclera: Conjunctivae normal.     Pupils: Pupils are equal, round, and reactive to light.  Cardiovascular:     Rate and Rhythm: Normal rate. Rhythm irregular.     Heart sounds: Murmur heard.  No friction rub. No gallop.   Pulmonary:     Effort: Pulmonary effort is normal. No respiratory distress.     Breath sounds: Normal breath sounds. No stridor. No wheezing, rhonchi or rales.   Abdominal:     General: Bowel sounds are normal. There is no distension.     Palpations: Abdomen is soft.     Tenderness: There is no abdominal tenderness. There is no rebound.  Musculoskeletal:        General: No tenderness. Normal range of motion.  Lymphadenopathy:     Cervical: No cervical adenopathy.  Skin:    General: Skin is warm and dry.     Findings: No erythema or rash.  Neurological:     General: No focal deficit present.     Mental Status: She is oriented to person, place, and time.     Motor: No abnormal muscle tone.     Coordination: Coordination normal.  Psychiatric:        Mood and Affect: Mood normal.        Behavior: Behavior normal.        Thought Content: Thought content normal.        Judgment: Judgment normal.      CBC:    BMET Recent Labs    11/10/20 1019 11/11/20 0101  NA 129* 132*  K 3.6 3.8  CL 102 104  CO2 21* 22  GLUCOSE 231* 151*  BUN 6* 7*  CREATININE 0.82 0.75  CALCIUM 7.7* 8.1*     Liver Panel  Recent Labs    11/10/20 0215  PROT 5.4*  ALBUMIN 1.9*  AST 34  ALT 34  ALKPHOS 86  BILITOT 1.3*  BILIDIR 0.3*  IBILI 1.0*       Sedimentation Rate No results for input(s): ESRSEDRATE in the last 72 hours. C-Reactive Protein No results for input(s): CRP in the last 72 hours.  Micro Results: Recent Results (from the past 720 hour(s))  Resp Panel by RT-PCR (Flu A&B, Covid) Nasopharyngeal Swab     Status: None   Collection Time: 11/05/20  7:19 AM   Specimen: Nasopharyngeal Swab; Nasopharyngeal(NP) swabs in vial transport medium  Result Value Ref Range Status   SARS Coronavirus 2 by RT PCR NEGATIVE NEGATIVE Final    Comment: (NOTE) SARS-CoV-2 target nucleic acids are NOT DETECTED.  The SARS-CoV-2 RNA is generally detectable in upper respiratory specimens during the acute phase of infection. The lowest concentration of SARS-CoV-2 viral copies this assay can detect is 138 copies/mL. A negative result does not preclude  SARS-Cov-2 infection and should not be used as the sole basis for treatment or other patient management decisions. A negative result may occur with  improper specimen collection/handling, submission of specimen other than nasopharyngeal  swab, presence of viral mutation(s) within the areas targeted by this assay, and inadequate number of viral copies(<138 copies/mL). A negative result must be combined with clinical observations, patient history, and epidemiological information. The expected result is Negative.  Fact Sheet for Patients:  EntrepreneurPulse.com.au  Fact Sheet for Healthcare Providers:  IncredibleEmployment.be  This test is no t yet approved or cleared by the [place] FDA and  has been authorized for detection and/or diagnosis of SARS-CoV-2 by FDA under an Emergency Use Authorization (EUA). This EUA will remain  in effect (meaning this test can be used) for the duration of the COVID-19 declaration under Section 564(b)(1) of the Act, 21 U.S.C.section 360bbb-3(b)(1), unless the authorization is terminated  or revoked sooner.       Influenza A by PCR NEGATIVE NEGATIVE Final   Influenza B by PCR NEGATIVE NEGATIVE Final    Comment: (NOTE) The Xpert Xpress SARS-CoV-2/FLU/RSV plus assay is intended as an aid in the diagnosis of influenza from Nasopharyngeal swab specimens and should not be used as a sole basis for treatment. Nasal washings and aspirates are unacceptable for Xpert Xpress SARS-CoV-2/FLU/RSV testing.  Fact Sheet for Patients: EntrepreneurPulse.com.au  Fact Sheet for Healthcare Providers: IncredibleEmployment.be  This test is not yet approved or cleared by the [place] FDA and has been authorized for detection and/or diagnosis of SARS-CoV-2 by FDA under an Emergency Use Authorization (EUA). This EUA will remain in effect (meaning this test can be used) for the duration of  the COVID-19 declaration under Section 564(b)(1) of the Act, 21 U.S.C. section 360bbb-3(b)(1), unless the authorization is terminated or revoked.  Performed at [facility], [address] [place], [place] [address]   Urine culture     Status: None   Collection Time: 11/05/20  7:26 AM   Specimen: In/Out Cath Urine  Result Value Ref Range Status   Specimen Description   Final    IN/OUT CATH URINE Performed at [facility], [address] [place], [place] [address]    Special Requests   Final    NONE Performed at [facility], [address] [place], [place] [address]    Culture   Final    NO GROWTH Performed at [facility], [place] [address], [place], [place] [address]    Report Status 11/06/2020 FINAL  Final  Blood Culture (routine x 2)     Status: Abnormal   Collection Time: 11/05/20  7:27 AM   Specimen: BLOOD RIGHT HAND  Result Value Ref Range Status   Specimen Description   Final    BLOOD RIGHT HAND Performed at [facility], [address] [place], [place] [address]    Special Requests   Final    BOTTLES DRAWN AEROBIC AND ANAEROBIC Blood Culture adequate volume Performed at [facility], [address] [place], [place] [address]    Culture  Setup Time   Final    GRAM POSITIVE RODS IN BOTH AEROBIC AND ANAEROBIC BOTTLES CRITICAL RESULT CALLED TO, READ BACK BY AND VERIFIED WITH: PHARMD HALEY B. 1936 [id number] FCP    Culture (A)  Final    ACTINOMYCES NEUII Standardized susceptibility testing for this organism is not available. Performed at [facility], [place] [address] [place], [place] [address]    Report Status 11/09/2020 FINAL  Final  MRSA PCR Screening     Status: None   Collection Time: 11/05/20 11:19 AM  Specimen: Nasal Mucosa; Nasopharyngeal  Result Value Ref Range Status   MRSA by PCR NEGATIVE NEGATIVE Final    Comment:        The GeneXpert MRSA Assay (FDA approved for NASAL  specimens only), is one component of a comprehensive MRSA colonization surveillance program. It is not intended to diagnose MRSA infection nor to guide or monitor treatment for MRSA infections. Performed at [facility], [place] [address] [place], [place] [address]   Culture, blood (routine x 2)     Status: None (Preliminary result)   Collection Time: 11/09/20 10:16 AM   Specimen: BLOOD LEFT HAND  Result Value Ref Range Status   Specimen Description BLOOD LEFT HAND  Final   Special Requests   Final    BOTTLES DRAWN AEROBIC ONLY Blood Culture results may not be optimal due to an inadequate volume of blood received in culture bottles   Culture   Final    NO GROWTH 3 DAYS Performed at [facility], [place] [address], [place], [place] [address]    Report Status PENDING  Incomplete  Culture, blood (routine x 2)     Status: None (Preliminary result)   Collection Time: 11/09/20 10:24 AM   Specimen: BLOOD RIGHT HAND  Result Value Ref Range Status   Specimen Description BLOOD RIGHT HAND  Final   Special Requests   Final    BOTTLES DRAWN AEROBIC ONLY Blood Culture adequate volume   Culture   Final    NO GROWTH 3 DAYS Performed at [facility], [place] [address] [place], [place] [address]    Report Status PENDING  Incomplete  Body fluid culture w Gram Stain     Status: None (Preliminary result)   Collection Time: 11/11/20  3:49 PM   Specimen: Fluid  Result Value Ref Range Status   Specimen Description FLUID LEFT PLEURAL  Final   Special Requests NONE  Final   Gram Stain   Final    RARE WBC PRESENT, PREDOMINANTLY MONONUCLEAR NO ORGANISMS SEEN    Culture   Final    NO GROWTH < 12 HOURS Performed at [facility], [place] [address] [place],  [address]    Report Status PENDING  Incomplete    Studies/Results: CT MAXILLOFACIAL W CONTRAST  Result Date: 11/11/2020 CLINICAL DATA:  Actinomyces bacteremia EXAM: CT MAXILLOFACIAL WITH CONTRAST TECHNIQUE:  Multidetector CT imaging of the maxillofacial structures was performed with intravenous contrast. Multiplanar CT image reconstructions were also generated. CONTRAST:  62m OMNIPAQUE IOHEXOL 300 MG/ML  SOLN COMPARISON:  None. FINDINGS: Osseous: No facial fracture or focal osseous abnormality. Orbits: The globes are intact. Normal appearance of the intra- and extraconal fat. Symmetric extraocular muscles. Sinuses: No fluid levels or advanced mucosal thickening. Soft tissues: Normal visualized extracranial soft tissues. Limited intracranial: Normal. IMPRESSION: Paranasal sinuses clear. Electronically Signed   By: KUlyses JarredM.D.   On: 11/11/2020 22:41   DG Chest Port 1 View  Result Date: 11/12/2020 CLINICAL DATA:  Status post PICC line placement EXAM: PORTABLE CHEST 1 VIEW COMPARISON:  11/11/2020 FINDINGS: Cardiac shadow is stable. Changes of prior TAVR and median sternotomy are again seen and stable. New right-sided PICC line is noted with the catheter tip at the cavoatrial junction in satisfactory position. Small bilateral pleural effusions are again identified right greater than left. No new focal abnormality is seen. IMPRESSION: Right-sided PICC line in satisfactory position as described. The remainder of the exam is stable from the previous day.  Electronically Signed   By: Inez Catalina M.D.   On: 11/12/2020 12:32   DG CHEST PORT 1 VIEW  Result Date: 11/11/2020 CLINICAL DATA:  Status post thoracentesis EXAM: PORTABLE CHEST 1 VIEW COMPARISON:  Earlier today at 3:08 p.m. FINDINGS: 3:44 p.m. Right larger than left pleural effusions. The left-sided effusion is likely smaller. No pneumothorax. Midline trachea. Mild cardiomegaly with transverse aortic atherosclerosis. Mild interstitial edema. Minimal improvement in bibasilar airspace disease. IMPRESSION: No pneumothorax or other complication after thoracentesis. Congestive heart failure with minimal improvement in bibasilar airspace disease. Aortic  Atherosclerosis (ICD10-I70.0). Electronically Signed   By: Abigail Miyamoto M.D.   On: 11/11/2020 16:16   DG CHEST PORT 1 VIEW  Result Date: 11/11/2020 CLINICAL DATA:  Tachypnea. Hypertension. Prior aortic valve replacement. EXAM: PORTABLE CHEST 1 VIEW COMPARISON:  11/06/2020 FINDINGS: Stable cardiomegaly. Prior CABG and TAVR again noted. Increased diffuse interstitial infiltrates consistent with interstitial edema. Small to moderate bilateral pleural effusions are seen, left side greater than right, also new since previous study. IMPRESSION: Increased diffuse interstitial edema.  Stable cardiomegaly. Increased small to moderate bilateral pleural effusions and bibasilar atelectasis. Electronically Signed   By: Marlaine Hind M.D.   On: 11/11/2020 15:40   ECHO TEE  Result Date: 11/11/2020    TRANSESOPHOGEAL ECHO REPORT   Patient Name:   Tracy Buck Date of Exam: 11/11/2020 Medical Rec #:  [id number]    Height:       63.0 in Accession #:    [id number]   Weight:       172.4 lb Date of Birth:  04-17-1935    BSA:          1.815 m Patient Age:    86 years     BP:           101/59 mmHg Patient Gender: F            HR:           81 bpm. Exam Location:  Inpatient Procedure: Transesophageal Echo, Cardiac Doppler and Color Doppler Indications:     Bacteremia  History:         Patient has prior history of Echocardiogram examinations, most                  recent 11/05/2020. CAD, Aortic Valve Disease, Mitral Valve                  Disease and TAVR, MR< TR, Arrythmias:Atrial Fibrillation,                  Signs/Symptoms:Bacteremia and Sepsis; Risk Factors:Hypertension                  and Dyslipidemia.  Sonographer:     Dustin Flock Referring Phys:  [id number] Tami Lin [facility] Diagnosing Phys: Skeet Latch MD PROCEDURE: The transesophogeal probe was passed without difficulty through the esophogus of the patient. Local oropharyngeal anesthetic was provided with Benzocaine spray. Sedation performed by performing physician.  The patient was monitored while under deep sedation. Anesthestetic sedation was provided intravenously by Anesthesiology: 83.59m of Propofol. The patient's vital signs; including heart rate, blood pressure, and oxygen saturation; remained stable throughout the procedure. The patient developed no complications during the procedure. IMPRESSIONS  1. Left ventricular ejection fraction, by estimation, is 60 to 65%. The left ventricle has normal function. The left ventricle has no regional wall motion abnormalities.  2. Right ventricular systolic function is normal. The right ventricular size is normal. There is  mildly elevated pulmonary artery systolic pressure.  3. No left atrial/left atrial appendage thrombus was detected.  4. Multiple regurgitant jets. No pulmonary vein flow reversal. The mitral valve is normal in structure. Mild to moderate mitral valve regurgitation. No evidence of mitral stenosis.  5. Tricuspid valve regurgitation is severe.  6. The aortic valve has been repaired/replaced. Aortic valve regurgitation is not visualized. No aortic stenosis is present. Aortic valve area, by VTI measures 1.25 cm. Aortic valve mean gradient measures 8.0 mmHg. Aortic valve Vmax measures 2.01 m/s.  7. The inferior vena cava is normal in size with greater than 50% respiratory variability, suggesting right atrial pressure of 3 mmHg.  8. There is a small patent foramen ovale. Conclusion(s)/Recommendation(s): Normal biventricular function without evidence of hemodynamically significant valvular heart disease. FINDINGS  Left Ventricle: Left ventricular ejection fraction, by estimation, is 60 to 65%. The left ventricle has normal function. The left ventricle has no regional wall motion abnormalities. The left ventricular internal cavity size was normal in size. There is  no left ventricular hypertrophy. Right Ventricle: The right ventricular size is normal. No increase in right ventricular wall thickness. Right ventricular  systolic function is normal. There is mildly elevated pulmonary artery systolic pressure. The tricuspid regurgitant velocity is 3.20  m/s, and with an assumed right atrial pressure of 3 mmHg, the estimated right ventricular systolic pressure is 02.3 mmHg. Left Atrium: Left atrial size was normal in size. No left atrial/left atrial appendage thrombus was detected. Right Atrium: Right atrial size was normal in size. Pericardium: There is no evidence of pericardial effusion. Mitral Valve: Multiple regurgitant jets. No pulmonary vein flow reversal. The mitral valve is normal in structure. There is moderate thickening of the mitral valve leaflet(s). Mild to moderate mitral annular calcification. Mild to moderate mitral valve regurgitation. No evidence of mitral valve stenosis. Tricuspid Valve: The tricuspid valve is normal in structure. Tricuspid valve regurgitation is severe. No evidence of tricuspid stenosis. Aortic Valve: The aortic valve has been repaired/replaced. Aortic valve regurgitation is not visualized. No aortic stenosis is present. Aortic valve mean gradient measures 8.0 mmHg. Aortic valve peak gradient measures 16.2 mmHg. Aortic valve area, by VTI  measures 1.25 cm. There is a 23 mm CoreValve-Evolut Pro prosthetic, stented (TAVR) valve present in the aortic position. Pulmonic Valve: The pulmonic valve was normal in structure. Pulmonic valve regurgitation is not visualized. No evidence of pulmonic stenosis. Aorta: The aortic root is normal in size and structure. There is minimal (Grade I) atheroma plaque involving the descending aorta. Venous: The inferior vena cava is normal in size with greater than 50% respiratory variability, suggesting right atrial pressure of 3 mmHg. IAS/Shunts: No atrial level shunt detected by color flow Doppler. A small patent foramen ovale is detected.  LEFT VENTRICLE PLAX 2D LVOT diam:     1.90 cm LV SV:         45 LV SV Index:   25 LVOT Area:     2.84 cm  AORTIC VALVE AV Area  (Vmax):    1.18 cm AV Area (Vmean):   1.34 cm AV Area (VTI):     1.25 cm AV Vmax:           201.00 cm/s AV Vmean:          131.000 cm/s AV VTI:            0.356 m AV Peak Grad:      16.2 mmHg AV Mean Grad:      8.0  mmHg LVOT Vmax:         83.60 cm/s LVOT Vmean:        61.700 cm/s LVOT VTI:          0.157 m LVOT/AV VTI ratio: 0.44 MR Peak grad:   144.5 mmHg  TRICUSPID VALVE MR Mean grad:   68.0 mmHg   TR Peak grad:   41.0 mmHg MR Vmax:        601.00 cm/s TR Vmax:        320.00 cm/s MR Vmean:       372.0 cm/s MR PISA:        1.57 cm    SHUNTS MR PISA Radius: 0.50 cm     Systemic VTI:  0.16 m                             Systemic Diam: 1.90 cm Skeet Latch MD Electronically signed by Skeet Latch MD Signature Date/Time: 11/11/2020/3:18:10 PM    Final    [place] EKG SITE RITE  Result Date: 11/11/2020 If Site Rite image not attached, placement could not be confirmed due to current cardiac rhythm.     Assessment/Plan:  INTERVAL HISTORY: She is status post thoracentesis with large transudate of effusion   Principal Problem:   Actinomyces infection Active Problems:   S/P AVR (aortic valve replacement)   Acute respiratory failure with hypoxia (HCC)   Lactic acidosis   Elevated troponin   Atrial fibrillation with RVR (HCC)   Severe sepsis with septic shock (HCC)   Malnutrition of moderate degree   Acute renal failure (HCC)   Bacteremia   Fatigue   Sepsis with acute renal failure without septic shock (HCC)    Tracy Buck is a 86 y.o. 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  CT scan shows possible duodenitis.  She has been narrowed from Zosyn to Unasyn.  2D echocardiogram showed crease gradient across the aortic valve as well as worsening tricuspid and mitral regurgitation  Repeat cultures are negative so  far.  TEE is negative for vegetations  CT maxillofacial was unrevealing  We have changed her to penicillin  We will order PICC line placement  Iam tentatively planning on giving her a month of continuous penicillin followed by oral amoxicillin TO reach 6 months to a year of therapy.  Thrush: Continue nystatin swish and swallow.\  Pleural effusions: Appear to be transudative  Diagnosis: Actinomyces bacteremia  Culture Result: Actinomyces  Allergies  Allergen Reactions  . Fig Extract [Ficus] Anaphylaxis  . Citalopram Hydrobromide Other (See Comments)    Pt unsure if this is accurate  . Sulfa Antibiotics Other (See Comments)    Severe nervousness.  . Prednisone Other (See Comments)    Other reaction(s): Other (See Comments) So nervous I could climb the walls So nervous I could climb the walls     OPAT Orders Discharge antibiotics:  Penicillin G 24 million units administered as a continuous infusion   Duration:  4 weeks End Date:  December 07, 2020  [facility] Care Per Protocol:   Labs  weekly while on IV antibiotics: x__ CBC with differential _x_ BMP w GFR/CMP _x_ CRP _x_ ESR   _x_ Please pull PIC at completion of IV antibiotics __ Please leave PIC in place until doctor has seen patient  or been notified  Fax weekly labs to [phone]  Clinic Follow Up Appt:      Cataleya Cristina has an appointment on 11/28/2020 at 1045 AM with Dr. Tommy Medal  [facility] for Infectious Disease is located in the [facility]  [place] in [address].  [address], which is located to the left of the elevators.  Phone: [phone]  Fax: [phone]  [email or web address]  She should arrive 15 minutes piror to her appointment.  I spent greater than 35 minutes with the patient including greater than 50% of time in face to face counsel of the patient and her daughter regarding nature of this infection and in  coordination of her care with pharmacy   LOS: 7 days   Alcide Evener 11/12/2020, 1:04 PM

## 2020-11-12 NOTE — Progress Notes (Signed)
Informed by telemetry that pt had 8 beat run of vtach. Patient asleep in bed. VSS. MD notified.

## 2020-11-12 NOTE — Progress Notes (Addendum)
Progress Note  Patient Name: Tracy Buck Date of Encounter: 11/12/2020  [facility] Cardiologist:  [facility] Dr. Bishop Limbo  In [facility]  Subjective   Resting, wakes up easily, no current concerns.  Reviewed events of yesterday afternoon with respiratory distress requiring diuresis.  Underwent thoracentesis.   Inpatient Medications    Scheduled Meds: . apixaban  5 mg Oral BID  . aspirin EC  81 mg Oral Daily  . Chlorhexidine Gluconate Cloth  6 each Topical Daily  . diltiazem  30 mg Oral Q6H  . feeding supplement  1 Container Oral TID BM  . fluticasone furoate-vilanterol  1 puff Inhalation Daily  . loratadine  10 mg Oral Daily  . melatonin  3 mg Oral QHS  . nystatin  5 mL Oral QID  . rosuvastatin  20 mg Oral Daily  . umeclidinium bromide  1 puff Inhalation Daily   Continuous Infusions: . sodium chloride Stopped (11/05/20 0847)  . penicillin g continuous IV infusion 12 Million Units (11/11/20 2352)   PRN Meds: sodium chloride, acetaminophen, diphenhydrAMINE, docusate sodium, fentaNYL (SUBLIMAZE) injection, haloperidol lactate, ipratropium-albuterol, LORazepam, polyethylene glycol   Vital Signs    Vitals:   11/11/20 2310 11/12/20 0336 11/12/20 0800 11/12/20 0808  BP: 122/66 125/65 110/64   Pulse: 70 87 83 91  Resp: 20 20 (!) 22 (!) 29  Temp: 98.7 F (37.1 C) 98.4 F (36.9 C)    TempSrc: Axillary Axillary    SpO2: 99%  99% 99%  Weight:      Height:        Intake/Output Summary (Last 24 hours) at 11/12/2020 0921 Last data filed at 11/12/2020 1443 Gross per 24 hour  Intake 240 ml  Output 2400 ml  Net -2160 ml   Last 3 Weights 11/07/2020 11/05/2020 11/05/2020  Weight (lbs) 172 lb 6.4 oz 172 lb 6.4 oz 168 lb 1.6 oz  Weight (kg) 78.2 kg 78.2 kg 76.25 kg      Telemetry    Atrial fibrillation with HR in the  90s - Personally Reviewed  ECG    No new  Physical Exam   GEN: No acute distress.   Neck: no JVD  Cardiac: iRRR, 2/6 systolic murmur Respiratory:  diminished in bases GI: Soft, nontender, non-distended  MS: 1+ LE edema; No deformity. Neuro:  Nonfocal  Psych: Normal affect    Labs    High Sensitivity Troponin:   Recent Labs  Lab 11/05/20 0650 11/05/20 1242 11/05/20 1508  TROPONINIHS 1,716* 1,693* 1,404*      Chemistry Recent Labs  Lab 11/08/20 0159 11/09/20 0114 11/10/20 0215 11/10/20 1019 11/11/20 0101  NA  --  130*  --  129* 132*  K  --  3.7  --  3.6 3.8  CL  --  100  --  102 104  CO2  --  22  --  21* 22  GLUCOSE  --  122*  --  231* 151*  BUN  --  9  --  6* 7*  CREATININE  --  0.77  --  0.82 0.75  CALCIUM  --  7.8*  --  7.7* 8.1*  PROT 5.3* 5.5* 5.4*  --   --   ALBUMIN 2.0* 1.9*  1.9* 1.9*  --   --   AST 23 21 34  --   --   ALT 35 27 34  --   --   ALKPHOS 100 93 86  --   --   BILITOT 1.5* 1.3* 1.3*  --   --  GFRNONAA  --  >60  --  >60 >60  ANIONGAP  --  8  --  6 6     Hematology Recent Labs  Lab 11/10/20 0215 11/11/20 0101 11/12/20 0308  WBC 11.2* 13.5* 9.0  RBC 3.64* 3.92 3.67*  HGB 10.7* 11.6* 11.1*  HCT 32.6* 35.7* 33.0*  MCV 89.6 91.1 89.9  MCH 29.4 29.6 30.2  MCHC 32.8 32.5 33.6  RDW 14.7 14.9 14.8  PLT 185 209 250    BNP No results for input(s): BNP, PROBNP in the last 168 hours.   DDimer No results for input(s): DDIMER in the last 168 hours.   Radiology    CT MAXILLOFACIAL W CONTRAST  Result Date: 11/11/2020 CLINICAL DATA:  Actinomyces bacteremia EXAM: CT MAXILLOFACIAL WITH CONTRAST TECHNIQUE: Multidetector CT imaging of the maxillofacial structures was performed with intravenous contrast. Multiplanar CT image reconstructions were also generated. CONTRAST:  64mL OMNIPAQUE IOHEXOL 300 MG/ML  SOLN COMPARISON:  None. FINDINGS: Osseous: No facial fracture or focal osseous abnormality. Orbits: The globes are intact. Normal appearance of the intra- and extraconal fat. Symmetric extraocular muscles. Sinuses: No fluid levels or advanced mucosal thickening. Soft tissues: Normal visualized  extracranial soft tissues. Limited intracranial: Normal. IMPRESSION: Paranasal sinuses clear. Electronically Signed   By: Ulyses Jarred M.D.   On: 11/11/2020 22:41   DG CHEST PORT 1 VIEW  Result Date: 11/11/2020 CLINICAL DATA:  Status post thoracentesis EXAM: PORTABLE CHEST 1 VIEW COMPARISON:  Earlier today at 3:08 p.m. FINDINGS: 3:44 p.m. Right larger than left pleural effusions. The left-sided effusion is likely smaller. No pneumothorax. Midline trachea. Mild cardiomegaly with transverse aortic atherosclerosis. Mild interstitial edema. Minimal improvement in bibasilar airspace disease. IMPRESSION: No pneumothorax or other complication after thoracentesis. Congestive heart failure with minimal improvement in bibasilar airspace disease. Aortic Atherosclerosis (ICD10-I70.0). Electronically Signed   By: Abigail Miyamoto M.D.   On: 11/11/2020 16:16   DG CHEST PORT 1 VIEW  Result Date: 11/11/2020 CLINICAL DATA:  Tachypnea. Hypertension. Prior aortic valve replacement. EXAM: PORTABLE CHEST 1 VIEW COMPARISON:  11/06/2020 FINDINGS: Stable cardiomegaly. Prior CABG and TAVR again noted. Increased diffuse interstitial infiltrates consistent with interstitial edema. Small to moderate bilateral pleural effusions are seen, left side greater than right, also new since previous study. IMPRESSION: Increased diffuse interstitial edema.  Stable cardiomegaly. Increased small to moderate bilateral pleural effusions and bibasilar atelectasis. Electronically Signed   By: Marlaine Hind M.D.   On: 11/11/2020 15:40   ECHO TEE  Result Date: 11/11/2020    TRANSESOPHOGEAL ECHO REPORT   Patient Name:   Tracy Buck Date of Exam: 11/11/2020 Medical Rec #:  [id number]    Height:       63.0 in Accession #:    [id number]   Weight:       172.4 lb Date of Birth:  1935-05-31    BSA:          1.815 m Patient Age:    86 years     BP:           101/59 mmHg Patient Gender: F            HR:           81 bpm. Exam Location:  Inpatient Procedure:  Transesophageal Echo, Cardiac Doppler and Color Doppler Indications:     Bacteremia  History:         Patient has prior history of Echocardiogram examinations, most  recent 11/05/2020. CAD, Aortic Valve Disease, Mitral Valve                  Disease and TAVR, MR< TR, Arrythmias:Atrial Fibrillation,                  Signs/Symptoms:Bacteremia and Sepsis; Risk Factors:Hypertension                  and Dyslipidemia.  Sonographer:     Dustin Flock Referring Phys:  [id number] Tami Lin [facility] Diagnosing Phys: Skeet Latch MD PROCEDURE: The transesophogeal probe was passed without difficulty through the esophogus of the patient. Local oropharyngeal anesthetic was provided with Benzocaine spray. Sedation performed by performing physician. The patient was monitored while under deep sedation. Anesthestetic sedation was provided intravenously by Anesthesiology: 83.24mg  of Propofol. The patient's vital signs; including heart rate, blood pressure, and oxygen saturation; remained stable throughout the procedure. The patient developed no complications during the procedure. IMPRESSIONS  1. Left ventricular ejection fraction, by estimation, is 60 to 65%. The left ventricle has normal function. The left ventricle has no regional wall motion abnormalities.  2. Right ventricular systolic function is normal. The right ventricular size is normal. There is mildly elevated pulmonary artery systolic pressure.  3. No left atrial/left atrial appendage thrombus was detected.  4. Multiple regurgitant jets. No pulmonary vein flow reversal. The mitral valve is normal in structure. Mild to moderate mitral valve regurgitation. No evidence of mitral stenosis.  5. Tricuspid valve regurgitation is severe.  6. The aortic valve has been repaired/replaced. Aortic valve regurgitation is not visualized. No aortic stenosis is present. Aortic valve area, by VTI measures 1.25 cm. Aortic valve mean gradient measures 8.0 mmHg. Aortic  valve Vmax measures 2.01 m/s.  7. The inferior vena cava is normal in size with greater than 50% respiratory variability, suggesting right atrial pressure of 3 mmHg.  8. There is a small patent foramen ovale. Conclusion(s)/Recommendation(s): Normal biventricular function without evidence of hemodynamically significant valvular heart disease. FINDINGS  Left Ventricle: Left ventricular ejection fraction, by estimation, is 60 to 65%. The left ventricle has normal function. The left ventricle has no regional wall motion abnormalities. The left ventricular internal cavity size was normal in size. There is  no left ventricular hypertrophy. Right Ventricle: The right ventricular size is normal. No increase in right ventricular wall thickness. Right ventricular systolic function is normal. There is mildly elevated pulmonary artery systolic pressure. The tricuspid regurgitant velocity is 3.20  m/s, and with an assumed right atrial pressure of 3 mmHg, the estimated right ventricular systolic pressure is 09.8 mmHg. Left Atrium: Left atrial size was normal in size. No left atrial/left atrial appendage thrombus was detected. Right Atrium: Right atrial size was normal in size. Pericardium: There is no evidence of pericardial effusion. Mitral Valve: Multiple regurgitant jets. No pulmonary vein flow reversal. The mitral valve is normal in structure. There is moderate thickening of the mitral valve leaflet(s). Mild to moderate mitral annular calcification. Mild to moderate mitral valve regurgitation. No evidence of mitral valve stenosis. Tricuspid Valve: The tricuspid valve is normal in structure. Tricuspid valve regurgitation is severe. No evidence of tricuspid stenosis. Aortic Valve: The aortic valve has been repaired/replaced. Aortic valve regurgitation is not visualized. No aortic stenosis is present. Aortic valve mean gradient measures 8.0 mmHg. Aortic valve peak gradient measures 16.2 mmHg. Aortic valve area, by VTI  measures  1.25 cm. There is a 23 mm CoreValve-Evolut Pro prosthetic, stented (TAVR) valve present in the aortic  position. Pulmonic Valve: The pulmonic valve was normal in structure. Pulmonic valve regurgitation is not visualized. No evidence of pulmonic stenosis. Aorta: The aortic root is normal in size and structure. There is minimal (Grade I) atheroma plaque involving the descending aorta. Venous: The inferior vena cava is normal in size with greater than 50% respiratory variability, suggesting right atrial pressure of 3 mmHg. IAS/Shunts: No atrial level shunt detected by color flow Doppler. A small patent foramen ovale is detected.  LEFT VENTRICLE PLAX 2D LVOT diam:     1.90 cm LV SV:         45 LV SV Index:   25 LVOT Area:     2.84 cm  AORTIC VALVE AV Area (Vmax):    1.18 cm AV Area (Vmean):   1.34 cm AV Area (VTI):     1.25 cm AV Vmax:           201.00 cm/s AV Vmean:          131.000 cm/s AV VTI:            0.356 m AV Peak Grad:      16.2 mmHg AV Mean Grad:      8.0 mmHg LVOT Vmax:         83.60 cm/s LVOT Vmean:        61.700 cm/s LVOT VTI:          0.157 m LVOT/AV VTI ratio: 0.44 MR Peak grad:   144.5 mmHg  TRICUSPID VALVE MR Mean grad:   68.0 mmHg   TR Peak grad:   41.0 mmHg MR Vmax:        601.00 cm/s TR Vmax:        320.00 cm/s MR Vmean:       372.0 cm/s MR PISA:        1.57 cm    SHUNTS MR PISA Radius: 0.50 cm     Systemic VTI:  0.16 m                             Systemic Diam: 1.90 cm Skeet Latch MD Electronically signed by Skeet Latch MD Signature Date/Time: 11/11/2020/3:18:10 PM    Final    [place] EKG SITE RITE  Result Date: 11/11/2020 If Site Rite image not attached, placement could not be confirmed due to current cardiac rhythm.   Cardiac Studies   Echo 11/05/2020 1. S/p TAVR (23 mm evolout pro). MG slightly higher across prosthesis  likely due to Afib with RVR and sepsis. MR is now moderate to severe. TR  is now moderate to severe. Would have low threshold for TEE if the patient   develops bacteremia with prosthetic  AVR and change in MR/TR. No definitive vegetation seen on this TTE.  2. 23 mm evolute pro (ViV procedure for prior 21 mm Magna ease) in the  aortic position. Vmax 2.8 m/s, MG 21 mmHG, EOA 1.18 cm2, DI 0.28. MG  slightly higher than what was reported at [facility] (15 mmHG 05/30/2020),  likely due to Afib and sepsis. The AT is  slightly prolonged ~114 msec. If bacteremia is present would recommend  TEE. The aortic valve has been repaired/replaced. Aortic valve  regurgitation is not visualized. There is a 23 mm CoreValve-Evolut Pro  prosthetic (TAVR) valve present in the aortic  position. Procedure Date: 05/01/2020.  3. Left ventricular ejection fraction, by estimation, is 50 to 55%. The  left ventricle has low normal function.  The left ventricle has no regional  wall motion abnormalities. There is mild concentric left ventricular  hypertrophy. Left ventricular  diastolic function could not be evaluated.  4. Right ventricular systolic function is moderately reduced. The right  ventricular size is mildly enlarged. There is moderately elevated  pulmonary artery systolic pressure. The estimated right ventricular  systolic pressure is 26.7 mmHg.  5. Right atrial size was mildly dilated.  6. The mitral valve is degenerative. Moderate to severe mitral valve  regurgitation. No evidence of mitral stenosis.  7. The tricuspid valve is abnormal. Tricuspid valve regurgitation is  moderate to severe.  8. The inferior vena cava is dilated in size with >50% respiratory  variability, suggesting right atrial pressure of 8 mmHg.   Patient Profile     86 y.o. 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   Assessment & Plan    Respiratory  distress- PCCM involved due to acute worsening.  - lasix 60 mg IV x 2 given with -2.4 L UOP afterward. - continue diuresis, will give lasix 60 IV now and monitor response - repeat dose in evening if adequate UOP.  -  I/o inaccurate, need strict I/o and daily weights.  - s/p L thoracentesis, 600 cc of amber fluid - independently reviewed TEE images. Grossly stable AVR, MR is moderate, TR is at least moderate.  - volume overload contributed to by atrial fibrillation  New onset afib RVR  - HR better controlled with initiation of diltiazem 30 mg Q6hr. Despite respiratory distress yesterday, rates stable. Will consolidate when she is compensated from volume standpoint.  - continue Apixaban 5mg  BID (age 16, weight 78 kg, cr normal) (CHADS2VASC score 5).  Consider outpatient DCCV if indicated in 3-4 weeks after [facility], if rate control adequate would not pursue now given sepsis and bacteremia.  Demand ischemia - [phone] - no CP and no focal WMA on echo - Likely demand ischemia, nonobstructive CAD on cath from 8/21. - would continue ASA and eliquis in dual therapy at this time given demand ischemia if no bleeding.  Valvular heart dz - Echo with LVEF of 50-55% - S/p TAVR (23 mm evolout pro). MG 21 mmHG, EOA 1.18 cm2, DI 0.28. MG  slightly higher than what was reported at [facility] (15 mmHG 05/30/2020),  likely due to Afib and sepsis.  Gradient was within normal range on TEE this morning with no apparent issues with prosthetic valve. -Moderate severe MR and TR by TTE, TEE shows mod MR, mod-severe TR -No vegetation seen on transesophageal echocardiogram.  Antibiotic plan is detailed in infectious diseases note.  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 Agree with diuresis per PCCM and as above.   For questions or updates, please contact [place] Please consult [email or web address] for contact info under        Signed, Elouise Munroe, MD   11/12/2020, 9:21 AM

## 2020-11-12 NOTE — Progress Notes (Signed)
PROGRESS NOTE    Tracy Buck  RXV:[id number] DOB: 03-29-1935 DOA: 11/05/2020 PCP: Drake Leach, MD   Brief Narrative:  This 86 year old female with history of COPD, hypertension, bioprosthetic aortic valve, 2021, CAD and lung cancer (carcinoid) s/p lobectomy. She was in her usual state of health until approximately 3/6 when she developed progressive fatigue. She has seen her PCP and cardiologist with these complaints without identifiable etiology. It was felt that perhaps polypharmacy was playing a role. Her PRN alprazolam was discontinued and Ambien dose was reduced from 10mg  to 5mg  QHS. On 3/16, early a.m.,  She has developed shortness of breath and chest tightness, presented to [facility].  EKG was felt nonischemic, had atrial fibrillation with RVR.  Patient was started on diltiazem bolus and infusion.  Patient also had low-grade fever 100 F, leukocytosis, placed on empiric antibiotic and sepsis work-up was initiated. Chest x-ray showed left-sided effusion at her former lobectomy site, creatinine 1.3, lactic acid 8.7, troponin 1700, BNP 1682. Patient then became hypotensive and was admitted to ICU.  She was admitted for severe sepsis which was presumed to be due to duodenitis and antibiotics were narrowed, now on IV Zosyn, for 7 to 10 days.  Cardiology was also consulted for atrial fibrillation with RVR and abnormal echo changes (worsened MR, TAVR position slightly off).  Patient was transferred to [facility] service on 11/07/2020.  Blood cultures grew actinomyces neuii, infectious disease consulted.  Infectious disease recommended TEE to rule out infection endocarditis.  Patient underwent TEE: No vegetation was found.  Infectious disease recommended Penicillin therapy for 1 month followed by oral amoxicillin to reach 6 months to a year of therapy.  Assessment & Plan:   Principal Problem:   Actinomyces infection Active Problems:   S/P AVR (aortic valve replacement)   Acute respiratory  failure with hypoxia (HCC)   Metabolic acidosis   Elevated troponin   Atrial fibrillation with RVR (HCC)   Severe sepsis with septic shock (HCC)   Malnutrition of moderate degree   Acute renal failure (HCC)   Bacteremia   Fatigue   Sepsis with acute renal failure without septic shock (HCC)   Status post thoracentesis  Acute hypoxic respiratory failure secondary to fluid overload: She was restless and tachypneic yesterday, ABG : No PCO retention. She was given Ativan and Haldol continues to remain tachypneic. PCCM consulted. She was given Lasix 60 mg IV every 12 hours,  underwent thoracocentesis 0.6L removed.  Appears to be transudate Continue Lasix twice daily.  She appears much improved.  Severe sepsis presumed to be due to duodenitis, present on admission -Urine culture negative.  Blood culture positive for gram-positive rods, actinomyces neuii -CT renal stone study showed inflammatory changes surrounding the duodenum and adjacent to the head of the pancreas with extension into the region of porta hepatis, concerning for probable duodenitis. -Leukocytosis improving, afebrile -IV antibiotics narrowed to Zosyn (previously on IV vancomycin, cefepime and Flagyl), narrowed down to Unasyn.  -Advance diet to solids. -Infectious disease consulted, recommended TEE to rule out infective endocarditis.  TEE shows no vegetations. -Infectious disease recommended penicillin therapy for 1 month followed by oral amoxicillin to reach 6 months to a year of therapy. -  She has PICC Line on 3/23  Active Problems: Atrial fibrillation with RVR, New onset -Patient was placed on IV Cardizem drip, heart rate improved however patient developed hypotension -Cardiology consulted, heart rate now increased, started Cardizem 30 mg every 6hrs -Patient was placed on IV heparin, now transitioned to  apixaban -Elevated troponin likely due to demand ischemia, atrial fibrillation with RVR, not ACS -2D echo with LVEF of  50 to 55% -Heart rate is controlled.  Patient is currently on Eliquis.  Abnormal EKG changes, worsened MR, TR, TAVR position change -Cardiology following, MR is now moderate to severe, TR is now moderate to severe -Blood cultures grew actinomyces neuii, infectious disease recommended TEE. -Patient underwent TEE, no vegetations found.  Acute kidney injury with metabolic acidosis, lactic acidosis > Resolved. -Baseline creatinine 0.9-1.05 -Presented with creatinine of 1.3, lactic acid 8.7 on admission -Patient was placed on bicarb drip, lactic acid improved to 2.3, bicarb normal 24, creatinine 0.9 -Diet advanced to solids today Acute kidney injury has resolved.  Metabolic acidosis has resolved.  Last CO2 was 24.  COPD -Currently stable, no wheezing, continue outpatient inhalers.  Hypertension -BP currently stable, holding antihypertensives.  Hyperlipidemia Continue statin  Generalized weakness: Patient reports progressive fatigue, dizziness, generalized debility. Patient was seen by her PCP, cardiologist, no identifiable etiology found.   Perhaps polypharmacy could be the reason.  Xanax was discontinued.   Ambien dose decreased from 10 mg to 5 mg, now discontinued -Continue melatonin -Would not resume Xanax outpatient, discussed with patient's daughter -Continue PT OT, mobilization, very deconditioned.  Malnutrition of moderate degree Very deconditioned, diet advanced , albumin 2.0 Estimated body mass index is 30.54 kg/m as calculated from the following:   Height as of this encounter: 5\' 3"  (1.6 m).   Weight as of this encounter: 78.2 kg.   DVT prophylaxis: Eliquis Code Status: Full code. Family Communication:  Daughter at bed side. Disposition Plan:   Status is: Inpatient  Remains inpatient appropriate because:Inpatient level of care appropriate due to severity of illness   Dispo: The patient is from: Home              Anticipated d/c is to: Home with Home  services.              Patient currently is not medically stable to d/c.   Difficult to place patient No  Consultants:   Cardiology  Infectious disease.  Procedures: TTE, TEE Antimicrobials:   Anti-infectives (From admission, onward)   Start     Dose/Rate Route Frequency Ordered Stop   11/11/20 1145  penicillin G potassium 12 Million Units in dextrose 5 % 500 mL continuous infusion        12 Million Units 41.7 mL/hr over 12 Hours Intravenous Every 12 hours 11/11/20 1045     11/09/20 1130  Ampicillin-Sulbactam (UNASYN) 3 g in sodium chloride 0.9 % 100 mL IVPB  Status:  Discontinued        3 g 200 mL/hr over 30 Minutes Intravenous Every 6 hours 11/09/20 1025 11/11/20 1045   11/06/20 0600  vancomycin (VANCOREADY) IVPB 750 mg/150 mL  Status:  Discontinued        750 mg 150 mL/hr over 60 Minutes Intravenous Every 24 hours 11/05/20 0757 11/06/20 0745   11/05/20 2000  ceFEPIme (MAXIPIME) 2 g in sodium chloride 0.9 % 100 mL IVPB  Status:  Discontinued        2 g 200 mL/hr over 30 Minutes Intravenous Every 12 hours 11/05/20 0757 11/05/20 1704   11/05/20 1745  piperacillin-tazobactam (ZOSYN) IVPB 3.375 g  Status:  Discontinued        3.375 g 12.5 mL/hr over 240 Minutes Intravenous Every 8 hours 11/05/20 1720 11/09/20 1015   11/05/20 0730  ceFEPIme (MAXIPIME) 2 g in sodium chloride 0.9 %  100 mL IVPB        2 g 200 mL/hr over 30 Minutes Intravenous  Once 11/05/20 0716 11/05/20 0814   11/05/20 0730  metroNIDAZOLE (FLAGYL) IVPB 500 mg        500 mg 100 mL/hr over 60 Minutes Intravenous  Once 11/05/20 0716 11/05/20 0850   11/05/20 0730  vancomycin (VANCOCIN) IVPB 1000 mg/200 mL premix        1,000 mg 200 mL/hr over 60 Minutes Intravenous  Once 11/05/20 0716 11/05/20 0914      Subjective: Patient was seen and examined at bedside.  Denies any overnight events.  Patient was tachypneic and restless yesterday,  was given Ativan and Haldol, continued to remain tachypneic. PCCM consulted,  given Lasix, ABG no PCO2 retention.  Underwent thoracocentesis 0.6 L fluid drained appears to be transudate. Patient reports feeling better.  She responds appropriately, alert and oriented x 3.  Objective: Vitals:   11/12/20 0800 11/12/20 0808 11/12/20 1154 11/12/20 1200  BP: 110/64  (!) 108/97 121/83  Pulse: 83 91 80 80  Resp: (!) 22 (!) 29 19   Temp:   98 F (36.7 C)   TempSrc:   Oral   SpO2: 99% 99% 99%   Weight:      Height:        Intake/Output Summary (Last 24 hours) at 11/12/2020 1335 Last data filed at 11/12/2020 4098 Gross per 24 hour  Intake 240 ml  Output 2400 ml  Net -2160 ml   Filed Weights   11/05/20 0707 11/05/20 1002 11/07/20 0500  Weight: 76.2 kg 78.2 kg 78.2 kg    Examination:  General exam: Appears calm and comfortable. Not in any acute distress. Respiratory system: Clear to auscultation. Respiratory effort normal. Cardiovascular system: S1 & S 2 heard, RRR. No JVD, murmurs, rubs, gallops or clicks. No pedal edema. Gastrointestinal system: Abdomen is nondistended, soft and nontender.  No organomegaly or masses felt. Normal bowel sounds heard. Central nervous system: Alert and oriented. No focal neurological deficits. Extremities: Symmetric 5 x 5 power.  No edema, no cyanosis, no clubbing. Skin: No rashes, lesions or ulcers Psychiatry: Judgement and insight appear normal. Mood & affect appropriate.     Data Reviewed: I have personally reviewed following labs and imaging studies  CBC: Recent Labs  Lab 11/08/20 0159 11/09/20 0114 11/10/20 0215 11/11/20 0101 11/12/20 0308  WBC 13.3* 11.8* 11.2* 13.5* 9.0  HGB 11.5* 11.2* 10.7* 11.6* 11.1*  HCT 34.2* 33.0* 32.6* 35.7* 33.0*  MCV 87.7 88.7 89.6 91.1 89.9  PLT 170 170 185 209 119   Basic Metabolic Panel: Recent Labs  Lab 11/06/20 0137 11/09/20 0114 11/10/20 1019 11/11/20 0101  NA 132* 130* 129* 132*  K 4.3 3.7 3.6 3.8  CL 102 100 102 104  CO2 24 22 21* 22  GLUCOSE 121* 122* 231* 151*   BUN 12 9 6* 7*  CREATININE 0.93 0.77 0.82 0.75  CALCIUM 7.7* 7.8* 7.7* 8.1*  MG 2.2 1.9  --  2.0  PHOS 3.5 3.3  --  3.6   GFR: Estimated Creatinine Clearance: 52.7 mL/min (by C-G formula based on SCr of 0.75 mg/dL). Liver Function Tests: Recent Labs  Lab 11/06/20 0137 11/07/20 0117 11/08/20 0159 11/09/20 0114 11/10/20 0215  AST 58* 43* 23 21 34  ALT 45* 50* 35 27 34  ALKPHOS 130* 110 100 93 86  BILITOT 2.3* 1.7* 1.5* 1.3* 1.3*  PROT 4.9* 5.0* 5.3* 5.5* 5.4*  ALBUMIN 2.0* 2.0* 2.0* 1.9*  1.9*  1.9*   Recent Labs  Lab 11/05/20 1823  LIPASE 25   No results for input(s): AMMONIA in the last 168 hours. Coagulation Profile: No results for input(s): INR, PROTIME in the last 168 hours. Cardiac Enzymes: No results for input(s): CKTOTAL, CKMB, CKMBINDEX, TROPONINI in the last 168 hours. BNP (last 3 results) No results for input(s): PROBNP in the last 8760 hours. HbA1C: No results for input(s): HGBA1C in the last 72 hours. CBG: Recent Labs  Lab 11/05/20 1600 11/05/20 1936 11/06/20 0333  GLUCAP 164* 133* 107*   Lipid Profile: No results for input(s): CHOL, HDL, LDLCALC, TRIG, CHOLHDL, LDLDIRECT in the last 72 hours. Thyroid Function Tests: No results for input(s): TSH, T4TOTAL, FREET4, T3FREE, THYROIDAB in the last 72 hours. Anemia Panel: No results for input(s): VITAMINB12, FOLATE, FERRITIN, TIBC, IRON, RETICCTPCT in the last 72 hours. Sepsis Labs: Recent Labs  Lab 11/05/20 1508  LATICACIDVEN 2.3*    Recent Results (from the past 240 hour(s))  Resp Panel by RT-PCR (Flu A&B, Covid) Nasopharyngeal Swab     Status: None   Collection Time: 11/05/20  7:19 AM   Specimen: Nasopharyngeal Swab; Nasopharyngeal(NP) swabs in vial transport medium  Result Value Ref Range Status   SARS Coronavirus 2 by RT PCR NEGATIVE NEGATIVE Final    Comment: (NOTE) SARS-CoV-2 target nucleic acids are NOT DETECTED.  The SARS-CoV-2 RNA is generally detectable in upper  respiratory specimens during the acute phase of infection. The lowest concentration of SARS-CoV-2 viral copies this assay can detect is 138 copies/mL. A negative result does not preclude SARS-Cov-2 infection and should not be used as the sole basis for treatment or other patient management decisions. A negative result may occur with  improper specimen collection/handling, submission of specimen other than nasopharyngeal swab, presence of viral mutation(s) within the areas targeted by this assay, and inadequate number of viral copies(<138 copies/mL). A negative result must be combined with clinical observations, patient history, and epidemiological information. The expected result is Negative.  Fact Sheet for Patients:  EntrepreneurPulse.com.au  Fact Sheet for Healthcare Providers:  IncredibleEmployment.be  This test is no t yet approved or cleared by the [place] FDA and  has been authorized for detection and/or diagnosis of SARS-CoV-2 by FDA under an Emergency Use Authorization (EUA). This EUA will remain  in effect (meaning this test can be used) for the duration of the COVID-19 declaration under Section 564(b)(1) of the Act, 21 U.S.C.section 360bbb-3(b)(1), unless the authorization is terminated  or revoked sooner.       Influenza A by PCR NEGATIVE NEGATIVE Final   Influenza B by PCR NEGATIVE NEGATIVE Final    Comment: (NOTE) The Xpert Xpress SARS-CoV-2/FLU/RSV plus assay is intended as an aid in the diagnosis of influenza from Nasopharyngeal swab specimens and should not be used as a sole basis for treatment. Nasal washings and aspirates are unacceptable for Xpert Xpress SARS-CoV-2/FLU/RSV testing.  Fact Sheet for Patients: EntrepreneurPulse.com.au  Fact Sheet for Healthcare Providers: IncredibleEmployment.be  This test is not yet approved or cleared by the [place] FDA and has been  authorized for detection and/or diagnosis of SARS-CoV-2 by FDA under an Emergency Use Authorization (EUA). This EUA will remain in effect (meaning this test can be used) for the duration of the COVID-19 declaration under Section 564(b)(1) of the Act, 21 U.S.C. section 360bbb-3(b)(1), unless the authorization is terminated or revoked.  Performed at [facility], [address], [place], [place] [address]   Urine culture  Status: None   Collection Time: 11/05/20  7:26 AM   Specimen: In/Out Cath Urine  Result Value Ref Range Status   Specimen Description   Final    IN/OUT CATH URINE Performed at [facility], [address] [place], [place] [address]    Special Requests   Final    NONE Performed at [facility], [address] [place], [place] [address]    Culture   Final    NO GROWTH Performed at [facility], [place] [address], [place], [place] [address]    Report Status 11/06/2020 FINAL  Final  Blood Culture (routine x 2)     Status: Abnormal   Collection Time: 11/05/20  7:27 AM   Specimen: BLOOD RIGHT HAND  Result Value Ref Range Status   Specimen Description   Final    BLOOD RIGHT HAND Performed at [facility], [address] [place], [place] [address]    Special Requests   Final    BOTTLES DRAWN AEROBIC AND ANAEROBIC Blood Culture adequate volume Performed at [facility], [address] [place], [place] [address]    Culture  Setup Time   Final    GRAM POSITIVE RODS IN BOTH AEROBIC AND ANAEROBIC BOTTLES CRITICAL RESULT CALLED TO, READ BACK BY AND VERIFIED WITH: PHARMD HALEY B. 1936 [id number] FCP    Culture (A)  Final    ACTINOMYCES NEUII Standardized susceptibility testing for this organism is not available. Performed at [facility], [place] [address] [place], [place] [address]    Report Status 11/09/2020 FINAL  Final  MRSA PCR Screening     Status: None   Collection Time: 11/05/20 11:19 AM    Specimen: Nasal Mucosa; Nasopharyngeal  Result Value Ref Range Status   MRSA by PCR NEGATIVE NEGATIVE Final    Comment:        The GeneXpert MRSA Assay (FDA approved for NASAL specimens only), is one component of a comprehensive MRSA colonization surveillance program. It is not intended to diagnose MRSA infection nor to guide or monitor treatment for MRSA infections. Performed at [facility], [place] [address] [place], [place] [address]   Culture, blood (routine x 2)     Status: None (Preliminary result)   Collection Time: 11/09/20 10:16 AM   Specimen: BLOOD LEFT HAND  Result Value Ref Range Status   Specimen Description BLOOD LEFT HAND  Final   Special Requests   Final    BOTTLES DRAWN AEROBIC ONLY Blood Culture results may not be optimal due to an inadequate volume of blood received in culture bottles   Culture   Final    NO GROWTH 3 DAYS Performed at [facility], [place] [address], [place], [place] [address]    Report Status PENDING  Incomplete  Culture, blood (routine x 2)     Status: None (Preliminary result)   Collection Time: 11/09/20 10:24 AM   Specimen: BLOOD RIGHT HAND  Result Value Ref Range Status   Specimen Description BLOOD RIGHT HAND  Final   Special Requests   Final    BOTTLES DRAWN AEROBIC ONLY Blood Culture adequate volume   Culture   Final    NO GROWTH 3 DAYS Performed at [facility], [place] [address], [place], [place] [address]    Report Status PENDING  Incomplete  Body fluid culture w Gram Stain     Status: None (Preliminary result)  Collection Time: 11/11/20  3:49 PM   Specimen: Fluid  Result Value Ref Range Status   Specimen Description FLUID LEFT PLEURAL  Final   Special Requests NONE  Final   Gram Stain   Final    RARE WBC PRESENT, PREDOMINANTLY MONONUCLEAR NO ORGANISMS SEEN    Culture   Final    NO GROWTH < 12 HOURS Performed at [facility], [address], [place],  [address]    Report Status PENDING   Incomplete    Radiology Studies: CT MAXILLOFACIAL W CONTRAST  Result Date: 11/11/2020 CLINICAL DATA:  Actinomyces bacteremia EXAM: CT MAXILLOFACIAL WITH CONTRAST TECHNIQUE: Multidetector CT imaging of the maxillofacial structures was performed with intravenous contrast. Multiplanar CT image reconstructions were also generated. CONTRAST:  68mL OMNIPAQUE IOHEXOL 300 MG/ML  SOLN COMPARISON:  None. FINDINGS: Osseous: No facial fracture or focal osseous abnormality. Orbits: The globes are intact. Normal appearance of the intra- and extraconal fat. Symmetric extraocular muscles. Sinuses: No fluid levels or advanced mucosal thickening. Soft tissues: Normal visualized extracranial soft tissues. Limited intracranial: Normal. IMPRESSION: Paranasal sinuses clear. Electronically Signed   By: Ulyses Jarred M.D.   On: 11/11/2020 22:41   DG Chest Port 1 View  Result Date: 11/12/2020 CLINICAL DATA:  Status post PICC line placement EXAM: PORTABLE CHEST 1 VIEW COMPARISON:  11/11/2020 FINDINGS: Cardiac shadow is stable. Changes of prior TAVR and median sternotomy are again seen and stable. New right-sided PICC line is noted with the catheter tip at the cavoatrial junction in satisfactory position. Small bilateral pleural effusions are again identified right greater than left. No new focal abnormality is seen. IMPRESSION: Right-sided PICC line in satisfactory position as described. The remainder of the exam is stable from the previous day. Electronically Signed   By: Inez Catalina M.D.   On: 11/12/2020 12:32   DG CHEST PORT 1 VIEW  Result Date: 11/11/2020 CLINICAL DATA:  Status post thoracentesis EXAM: PORTABLE CHEST 1 VIEW COMPARISON:  Earlier today at 3:08 p.m. FINDINGS: 3:44 p.m. Right larger than left pleural effusions. The left-sided effusion is likely smaller. No pneumothorax. Midline trachea. Mild cardiomegaly with transverse aortic atherosclerosis. Mild interstitial edema. Minimal improvement in bibasilar airspace  disease. IMPRESSION: No pneumothorax or other complication after thoracentesis. Congestive heart failure with minimal improvement in bibasilar airspace disease. Aortic Atherosclerosis (ICD10-I70.0). Electronically Signed   By: Abigail Miyamoto M.D.   On: 11/11/2020 16:16   DG CHEST PORT 1 VIEW  Result Date: 11/11/2020 CLINICAL DATA:  Tachypnea. Hypertension. Prior aortic valve replacement. EXAM: PORTABLE CHEST 1 VIEW COMPARISON:  11/06/2020 FINDINGS: Stable cardiomegaly. Prior CABG and TAVR again noted. Increased diffuse interstitial infiltrates consistent with interstitial edema. Small to moderate bilateral pleural effusions are seen, left side greater than right, also new since previous study. IMPRESSION: Increased diffuse interstitial edema.  Stable cardiomegaly. Increased small to moderate bilateral pleural effusions and bibasilar atelectasis. Electronically Signed   By: Marlaine Hind M.D.   On: 11/11/2020 15:40   ECHO TEE  Result Date: 11/11/2020    TRANSESOPHOGEAL ECHO REPORT   Patient Name:   Tracy Buck Date of Exam: 11/11/2020 Medical Rec #:  [id number]    Height:       63.0 in Accession #:    [id number]   Weight:       172.4 lb Date of Birth:  1935-07-26    BSA:          1.815 m Patient Age:    68 years  BP:           101/59 mmHg Patient Gender: F            HR:           81 bpm. Exam Location:  Inpatient Procedure: Transesophageal Echo, Cardiac Doppler and Color Doppler Indications:     Bacteremia  History:         Patient has prior history of Echocardiogram examinations, most                  recent 11/05/2020. CAD, Aortic Valve Disease, Mitral Valve                  Disease and TAVR, MR< TR, Arrythmias:Atrial Fibrillation,                  Signs/Symptoms:Bacteremia and Sepsis; Risk Factors:Hypertension                  and Dyslipidemia.  Sonographer:     Dustin Flock Referring Phys:  [id number] Tami Lin [facility] Diagnosing Phys: Skeet Latch MD PROCEDURE: The transesophogeal probe was passed  without difficulty through the esophogus of the patient. Local oropharyngeal anesthetic was provided with Benzocaine spray. Sedation performed by performing physician. The patient was monitored while under deep sedation. Anesthestetic sedation was provided intravenously by Anesthesiology: 83.24mg  of Propofol. The patient's vital signs; including heart rate, blood pressure, and oxygen saturation; remained stable throughout the procedure. The patient developed no complications during the procedure. IMPRESSIONS  1. Left ventricular ejection fraction, by estimation, is 60 to 65%. The left ventricle has normal function. The left ventricle has no regional wall motion abnormalities.  2. Right ventricular systolic function is normal. The right ventricular size is normal. There is mildly elevated pulmonary artery systolic pressure.  3. No left atrial/left atrial appendage thrombus was detected.  4. Multiple regurgitant jets. No pulmonary vein flow reversal. The mitral valve is normal in structure. Mild to moderate mitral valve regurgitation. No evidence of mitral stenosis.  5. Tricuspid valve regurgitation is severe.  6. The aortic valve has been repaired/replaced. Aortic valve regurgitation is not visualized. No aortic stenosis is present. Aortic valve area, by VTI measures 1.25 cm. Aortic valve mean gradient measures 8.0 mmHg. Aortic valve Vmax measures 2.01 m/s.  7. The inferior vena cava is normal in size with greater than 50% respiratory variability, suggesting right atrial pressure of 3 mmHg.  8. There is a small patent foramen ovale. Conclusion(s)/Recommendation(s): Normal biventricular function without evidence of hemodynamically significant valvular heart disease. FINDINGS  Left Ventricle: Left ventricular ejection fraction, by estimation, is 60 to 65%. The left ventricle has normal function. The left ventricle has no regional wall motion abnormalities. The left ventricular internal cavity size was normal in size.  There is  no left ventricular hypertrophy. Right Ventricle: The right ventricular size is normal. No increase in right ventricular wall thickness. Right ventricular systolic function is normal. There is mildly elevated pulmonary artery systolic pressure. The tricuspid regurgitant velocity is 3.20  m/s, and with an assumed right atrial pressure of 3 mmHg, the estimated right ventricular systolic pressure is 78.6 mmHg. Left Atrium: Left atrial size was normal in size. No left atrial/left atrial appendage thrombus was detected. Right Atrium: Right atrial size was normal in size. Pericardium: There is no evidence of pericardial effusion. Mitral Valve: Multiple regurgitant jets. No pulmonary vein flow reversal. The mitral valve is normal in structure. There is moderate thickening of the mitral valve leaflet(s). Mild  to moderate mitral annular calcification. Mild to moderate mitral valve regurgitation. No evidence of mitral valve stenosis. Tricuspid Valve: The tricuspid valve is normal in structure. Tricuspid valve regurgitation is severe. No evidence of tricuspid stenosis. Aortic Valve: The aortic valve has been repaired/replaced. Aortic valve regurgitation is not visualized. No aortic stenosis is present. Aortic valve mean gradient measures 8.0 mmHg. Aortic valve peak gradient measures 16.2 mmHg. Aortic valve area, by VTI  measures 1.25 cm. There is a 23 mm CoreValve-Evolut Pro prosthetic, stented (TAVR) valve present in the aortic position. Pulmonic Valve: The pulmonic valve was normal in structure. Pulmonic valve regurgitation is not visualized. No evidence of pulmonic stenosis. Aorta: The aortic root is normal in size and structure. There is minimal (Grade I) atheroma plaque involving the descending aorta. Venous: The inferior vena cava is normal in size with greater than 50% respiratory variability, suggesting right atrial pressure of 3 mmHg. IAS/Shunts: No atrial level shunt detected by color flow Doppler. A small  patent foramen ovale is detected.  LEFT VENTRICLE PLAX 2D LVOT diam:     1.90 cm LV SV:         45 LV SV Index:   25 LVOT Area:     2.84 cm  AORTIC VALVE AV Area (Vmax):    1.18 cm AV Area (Vmean):   1.34 cm AV Area (VTI):     1.25 cm AV Vmax:           201.00 cm/s AV Vmean:          131.000 cm/s AV VTI:            0.356 m AV Peak Grad:      16.2 mmHg AV Mean Grad:      8.0 mmHg LVOT Vmax:         83.60 cm/s LVOT Vmean:        61.700 cm/s LVOT VTI:          0.157 m LVOT/AV VTI ratio: 0.44 MR Peak grad:   144.5 mmHg  TRICUSPID VALVE MR Mean grad:   68.0 mmHg   TR Peak grad:   41.0 mmHg MR Vmax:        601.00 cm/s TR Vmax:        320.00 cm/s MR Vmean:       372.0 cm/s MR PISA:        1.57 cm    SHUNTS MR PISA Radius: 0.50 cm     Systemic VTI:  0.16 m                             Systemic Diam: 1.90 cm Skeet Latch MD Electronically signed by Skeet Latch MD Signature Date/Time: 11/11/2020/3:18:10 PM    Final    [place] EKG SITE RITE  Result Date: 11/11/2020 If Site Rite image not attached, placement could not be confirmed due to current cardiac rhythm.  Scheduled Meds: . apixaban  5 mg Oral BID  . aspirin EC  81 mg Oral Daily  . Chlorhexidine Gluconate Cloth  6 each Topical Daily  . diltiazem  30 mg Oral Q6H  . feeding supplement  1 Container Oral TID BM  . fluticasone furoate-vilanterol  1 puff Inhalation Daily  . furosemide  60 mg Intravenous BID  . loratadine  10 mg Oral Daily  . melatonin  3 mg Oral QHS  . nystatin  5 mL Oral QID  . rosuvastatin  20 mg Oral  Daily  . sodium chloride flush  10-40 mL Intracatheter Q12H  . umeclidinium bromide  1 puff Inhalation Daily   Continuous Infusions: . sodium chloride Stopped (11/05/20 0847)  . penicillin g continuous IV infusion 12 Million Units (11/12/20 1239)     LOS: 7 days    Time spent: 25 mins    Allea Kassner, MD [facility]   If 7PM-7AM, please contact night-coverage

## 2020-11-12 NOTE — Progress Notes (Signed)
PHARMACY CONSULT NOTE FOR:  OUTPATIENT  PARENTERAL ANTIBIOTIC THERAPY (OPAT)  Indication: Actinomyces Bacteremia  Regimen: Penicillin G 24 million units administered as a continuous infusion End date: 12/07/2020  IV antibiotic discharge orders are pended. To discharging provider:  please sign these orders via discharge navigator,  Select New Orders & click on the button choice - Manage This Unsigned Work.     Thank you for allowing pharmacy to be a part of this patient's care.  Phillis Haggis 11/12/2020, 11:24 AM

## 2020-11-13 ENCOUNTER — Encounter (HOSPITAL_COMMUNITY): Payer: Self-pay | Admitting: Cardiovascular Disease

## 2020-11-13 DIAGNOSIS — J810 Acute pulmonary edema: Secondary | ICD-10-CM | POA: Diagnosis not present

## 2020-11-13 DIAGNOSIS — A429 Actinomycosis, unspecified: Secondary | ICD-10-CM | POA: Diagnosis not present

## 2020-11-13 DIAGNOSIS — Z95828 Presence of other vascular implants and grafts: Secondary | ICD-10-CM

## 2020-11-13 DIAGNOSIS — J9601 Acute respiratory failure with hypoxia: Secondary | ICD-10-CM | POA: Diagnosis not present

## 2020-11-13 DIAGNOSIS — B379 Candidiasis, unspecified: Secondary | ICD-10-CM

## 2020-11-13 DIAGNOSIS — J90 Pleural effusion, not elsewhere classified: Secondary | ICD-10-CM | POA: Diagnosis not present

## 2020-11-13 LAB — CBC
HCT: 31.4 % — ABNORMAL LOW (ref 36.0–46.0)
Hemoglobin: 10.7 g/dL — ABNORMAL LOW (ref 12.0–15.0)
MCH: 30.2 pg (ref 26.0–34.0)
MCHC: 34.1 g/dL (ref 30.0–36.0)
MCV: 88.7 fL (ref 80.0–100.0)
Platelets: 275 10*3/uL (ref 150–400)
RBC: 3.54 MIL/uL — ABNORMAL LOW (ref 3.87–5.11)
RDW: 14.4 % (ref 11.5–15.5)
WBC: 9.8 10*3/uL (ref 4.0–10.5)
nRBC: 0 % (ref 0.0–0.2)

## 2020-11-13 LAB — BASIC METABOLIC PANEL
Anion gap: 7 (ref 5–15)
BUN: 5 mg/dL — ABNORMAL LOW (ref 8–23)
CO2: 30 mmol/L (ref 22–32)
Calcium: 8.2 mg/dL — ABNORMAL LOW (ref 8.9–10.3)
Chloride: 95 mmol/L — ABNORMAL LOW (ref 98–111)
Creatinine, Ser: 0.88 mg/dL (ref 0.44–1.00)
GFR, Estimated: >60 mL/min (ref >60)
Glucose, Bld: 142 mg/dL — ABNORMAL HIGH (ref 70–99)
Potassium: 3 mmol/L — ABNORMAL LOW (ref 3.5–5.1)
Sodium: 132 mmol/L — ABNORMAL LOW (ref 135–145)

## 2020-11-13 LAB — CYTOLOGY - NON PAP

## 2020-11-13 MED ORDER — FUROSEMIDE 10 MG/ML IJ SOLN
60.0000 mg | Freq: Four times a day (QID) | INTRAMUSCULAR | Status: AC
  Administered 2020-11-13 (×3): 60 mg via INTRAVENOUS
  Filled 2020-11-13 (×3): qty 6

## 2020-11-13 MED ORDER — POTASSIUM CHLORIDE 20 MEQ PO PACK
40.0000 meq | PACK | Freq: Once | ORAL | Status: AC
  Administered 2020-11-13: 40 meq via ORAL
  Filled 2020-11-13: qty 2

## 2020-11-13 MED ORDER — ENSURE ENLIVE PO LIQD
237.0000 mL | Freq: Three times a day (TID) | ORAL | Status: DC
  Administered 2020-11-13 – 2020-11-16 (×6): 237 mL via ORAL

## 2020-11-13 MED ORDER — PROSOURCE PLUS PO LIQD
30.0000 mL | Freq: Two times a day (BID) | ORAL | Status: DC
  Administered 2020-11-13 – 2020-11-17 (×6): 30 mL via ORAL
  Filled 2020-11-13 (×6): qty 30

## 2020-11-13 MED ORDER — ADULT MULTIVITAMIN W/MINERALS CH
1.0000 | ORAL_TABLET | Freq: Every day | ORAL | Status: DC
  Administered 2020-11-13 – 2020-11-17 (×5): 1 via ORAL
  Filled 2020-11-13 (×5): qty 1

## 2020-11-13 NOTE — Progress Notes (Signed)
Progress Note  Patient Name: Tracy Buck Date of Encounter: 11/13/2020  [facility] Cardiologist:  [facility] Dr. Bishop Limbo  In [facility]  Subjective   Feeling better, not on supp O2.   Inpatient Medications    Scheduled Meds: . apixaban  5 mg Oral BID  . aspirin EC  81 mg Oral Daily  . Chlorhexidine Gluconate Cloth  6 each Topical Daily  . diltiazem  30 mg Oral Q6H  . feeding supplement  1 Container Oral TID BM  . fluticasone furoate-vilanterol  1 puff Inhalation Daily  . furosemide  60 mg Intravenous Q6H  . loratadine  10 mg Oral Daily  . melatonin  3 mg Oral QHS  . nystatin  5 mL Oral QID  . potassium chloride  40 mEq Oral Once  . rosuvastatin  20 mg Oral Daily  . sodium chloride flush  10-40 mL Intracatheter Q12H  . umeclidinium bromide  1 puff Inhalation Daily   Continuous Infusions: . sodium chloride Stopped (11/05/20 0847)  . penicillin g continuous IV infusion 12 Million Units (11/13/20 0040)   PRN Meds: sodium chloride, acetaminophen, diphenhydrAMINE, docusate sodium, fentaNYL (SUBLIMAZE) injection, haloperidol lactate, ipratropium-albuterol, LORazepam, polyethylene glycol, sodium chloride flush   Vital Signs    Vitals:   11/13/20 0400 11/13/20 0550 11/13/20 0727 11/13/20 0822  BP: (!) 106/57 114/60 (!) 122/58   Pulse: 65 86 73   Resp: (!) 21 (!) 21 20   Temp: 97.6 F (36.4 C) 97.8 F (36.6 C)    TempSrc: Oral Oral    SpO2: 94% 97% 94% 96%  Weight:      Height:        Intake/Output Summary (Last 24 hours) at 11/13/2020 0941 Last data filed at 11/13/2020 0300 Gross per 24 hour  Intake 1789.85 ml  Output 800 ml  Net 989.85 ml   Last 3 Weights 11/07/2020 11/05/2020 11/05/2020  Weight (lbs) 172 lb 6.4 oz 172 lb 6.4 oz 168 lb 1.6 oz  Weight (kg) 78.2 kg 78.2 kg 76.25 kg      Telemetry    Atrial fibrillation with HR in the  80s - Personally Reviewed  ECG    No new  Physical Exam   GEN: No acute distress.   Neck: No JVD Cardiac: iRRR, 1/6  SEM.  Respiratory: faint crackles base GI: Soft, nontender, non-distended  MS: trace to 1+ edema; No deformity. Neuro:  Nonfocal  Psych: Normal affect     Labs    High Sensitivity Troponin:   Recent Labs  Lab 11/05/20 0650 11/05/20 1242 11/05/20 1508  TROPONINIHS 1,716* 1,693* 1,404*      Chemistry Recent Labs  Lab 11/08/20 0159 11/09/20 0114 11/09/20 0114 11/10/20 0215 11/10/20 1019 11/11/20 0101 11/13/20 0500  NA  --  130*   < >  --  129* 132* 132*  K  --  3.7   < >  --  3.6 3.8 3.0*  CL  --  100   < >  --  102 104 95*  CO2  --  22   < >  --  21* 22 30  GLUCOSE  --  122*   < >  --  231* 151* 142*  BUN  --  9   < >  --  6* 7* 5*  CREATININE  --  0.77   < >  --  0.82 0.75 0.88  CALCIUM  --  7.8*   < >  --  7.7* 8.1* 8.2*  PROT 5.3* 5.5*  --  5.4*  --   --   --   ALBUMIN 2.0* 1.9*  1.9*  --  1.9*  --   --   --   AST 23 21  --  34  --   --   --   ALT 35 27  --  34  --   --   --   ALKPHOS 100 93  --  86  --   --   --   BILITOT 1.5* 1.3*  --  1.3*  --   --   --   GFRNONAA  --  >60   < >  --  >60 >60 >60  ANIONGAP  --  8   < >  --  6 6 7    < > = values in this interval not displayed.     Hematology Recent Labs  Lab 11/11/20 0101 11/12/20 0308 11/13/20 0500  WBC 13.5* 9.0 9.8  RBC 3.92 3.67* 3.54*  HGB 11.6* 11.1* 10.7*  HCT 35.7* 33.0* 31.4*  MCV 91.1 89.9 88.7  MCH 29.6 30.2 30.2  MCHC 32.5 33.6 34.1  RDW 14.9 14.8 14.4  PLT 209 250 275    BNP No results for input(s): BNP, PROBNP in the last 168 hours.   DDimer No results for input(s): DDIMER in the last 168 hours.   Radiology    CT MAXILLOFACIAL W CONTRAST  Result Date: 11/11/2020 CLINICAL DATA:  Actinomyces bacteremia EXAM: CT MAXILLOFACIAL WITH CONTRAST TECHNIQUE: Multidetector CT imaging of the maxillofacial structures was performed with intravenous contrast. Multiplanar CT image reconstructions were also generated. CONTRAST:  87mL OMNIPAQUE IOHEXOL 300 MG/ML  SOLN COMPARISON:  None.  FINDINGS: Osseous: No facial fracture or focal osseous abnormality. Orbits: The globes are intact. Normal appearance of the intra- and extraconal fat. Symmetric extraocular muscles. Sinuses: No fluid levels or advanced mucosal thickening. Soft tissues: Normal visualized extracranial soft tissues. Limited intracranial: Normal. IMPRESSION: Paranasal sinuses clear. Electronically Signed   By: Ulyses Jarred M.D.   On: 11/11/2020 22:41   DG Chest Port 1 View  Result Date: 11/12/2020 CLINICAL DATA:  Status post PICC line placement EXAM: PORTABLE CHEST 1 VIEW COMPARISON:  11/11/2020 FINDINGS: Cardiac shadow is stable. Changes of prior TAVR and median sternotomy are again seen and stable. New right-sided PICC line is noted with the catheter tip at the cavoatrial junction in satisfactory position. Small bilateral pleural effusions are again identified right greater than left. No new focal abnormality is seen. IMPRESSION: Right-sided PICC line in satisfactory position as described. The remainder of the exam is stable from the previous day. Electronically Signed   By: Inez Catalina M.D.   On: 11/12/2020 12:32   DG CHEST PORT 1 VIEW  Result Date: 11/11/2020 CLINICAL DATA:  Status post thoracentesis EXAM: PORTABLE CHEST 1 VIEW COMPARISON:  Earlier today at 3:08 p.m. FINDINGS: 3:44 p.m. Right larger than left pleural effusions. The left-sided effusion is likely smaller. No pneumothorax. Midline trachea. Mild cardiomegaly with transverse aortic atherosclerosis. Mild interstitial edema. Minimal improvement in bibasilar airspace disease. IMPRESSION: No pneumothorax or other complication after thoracentesis. Congestive heart failure with minimal improvement in bibasilar airspace disease. Aortic Atherosclerosis (ICD10-I70.0). Electronically Signed   By: Abigail Miyamoto M.D.   On: 11/11/2020 16:16   DG CHEST PORT 1 VIEW  Result Date: 11/11/2020 CLINICAL DATA:  Tachypnea. Hypertension. Prior aortic valve replacement. EXAM:  PORTABLE CHEST 1 VIEW COMPARISON:  11/06/2020 FINDINGS: Stable cardiomegaly. Prior CABG and TAVR again noted.  Increased diffuse interstitial infiltrates consistent with interstitial edema. Small to moderate bilateral pleural effusions are seen, left side greater than right, also new since previous study. IMPRESSION: Increased diffuse interstitial edema.  Stable cardiomegaly. Increased small to moderate bilateral pleural effusions and bibasilar atelectasis. Electronically Signed   By: Marlaine Hind M.D.   On: 11/11/2020 15:40   [place] EKG SITE RITE  Result Date: 11/11/2020 If [facility] image not attached, placement could not be confirmed due to current cardiac rhythm.   Cardiac Studies   Echo 11/05/2020 1. S/p TAVR (23 mm evolout pro). MG slightly higher across prosthesis  likely due to Afib with RVR and sepsis. MR is now moderate to severe. TR  is now moderate to severe. Would have low threshold for TEE if the patient  develops bacteremia with prosthetic  AVR and change in MR/TR. No definitive vegetation seen on this TTE.  2. 23 mm evolute pro (ViV procedure for prior 21 mm Magna ease) in the  aortic position. Vmax 2.8 m/s, MG 21 mmHG, EOA 1.18 cm2, DI 0.28. MG  slightly higher than what was reported at [facility] (15 mmHG 05/30/2020),  likely due to Afib and sepsis. The AT is  slightly prolonged ~114 msec. If bacteremia is present would recommend  TEE. The aortic valve has been repaired/replaced. Aortic valve  regurgitation is not visualized. There is a 23 mm CoreValve-Evolut Pro  prosthetic (TAVR) valve present in the aortic  position. Procedure Date: 05/01/2020.  3. Left ventricular ejection fraction, by estimation, is 50 to 55%. The  left ventricle has low normal function. The left ventricle has no regional  wall motion abnormalities. There is mild concentric left ventricular  hypertrophy. Left ventricular  diastolic function could not be evaluated.  4. Right ventricular systolic function is  moderately reduced. The right  ventricular size is mildly enlarged. There is moderately elevated  pulmonary artery systolic pressure. The estimated right ventricular  systolic pressure is 82.4 mmHg.  5. Right atrial size was mildly dilated.  6. The mitral valve is degenerative. Moderate to severe mitral valve  regurgitation. No evidence of mitral stenosis.  7. The tricuspid valve is abnormal. Tricuspid valve regurgitation is  moderate to severe.  8. The inferior vena cava is dilated in size with >50% respiratory  variability, suggesting right atrial pressure of 8 mmHg.   Patient Profile     86 y.o. 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   Assessment & Plan    Respiratory distress- PCCM involved due to acute worsening.  - lasix 60 mg IV TID today, discussed with PCCM -   need strict I/o and daily weights, appears net positive. - s/p L thoracentesis, 600 cc of amber fluid - independently reviewed TEE images. Grossly stable AVR, MR is moderate, TR is at least moderate.  - volume overload contributed to by atrial fibrillation, fortunately rate controlled now.   New onset afib RVR  - HR better controlled with initiation of diltiazem 30 mg Q6hr. Despite respiratory distress yesterday, rates stable. Will consolidate when she is compensated from volume standpoint.  - continue Apixaban 5mg  BID (age 82, weight 78 kg, cr normal) (CHADS2VASC score 5).  Consider outpatient DCCV if indicated in 3-4 weeks after [facility], if rate control adequate would not  pursue now given sepsis and bacteremia.  Demand ischemia - [phone] - no CP and no focal WMA on echo - Likely demand ischemia, nonobstructive CAD on cath from 8/21. - would continue ASA and eliquis in dual therapy  at this time given demand ischemia if no bleeding.  Valvular heart dz - Echo with LVEF of 50-55% - S/p TAVR (23 mm evolout pro). MG 21 mmHG, EOA 1.18 cm2, DI 0.28. MG  slightly higher than what was reported at [facility] (15 mmHG 05/30/2020),  likely due to Afib and sepsis.  Gradient was within normal range on TEE this morning with no apparent issues with prosthetic valve. -Moderate severe MR and TR by TTE, TEE shows mod MR, mod-severe TR -No vegetation seen on transesophageal echocardiogram.  Antibiotic plan is detailed in infectious diseases note.  Moderate RV dysfunction - likely driven by primary pulmonary process, complicated now by mod-severe TR. Agree with diuresis per PCCM and as above.   For questions or updates, please contact [place] Please consult [email or web address] for contact info under        Signed, Elouise Munroe, MD  11/13/2020, 9:41 AM

## 2020-11-13 NOTE — Progress Notes (Signed)
No bipap needed at this time. Pt resting comfortably HR 90 RR 18 SpO2 94 on room air.

## 2020-11-13 NOTE — Progress Notes (Signed)
PROGRESS NOTE    Tracy Buck  RXV:[id number] DOB: 28-Sep-1934 DOA: 11/05/2020 PCP: Drake Leach, MD   Brief Narrative:  This 86 year old female with history of COPD, hypertension, bioprosthetic aortic valve, 2021, CAD and lung cancer (carcinoid) s/p lobectomy. She was in her usual state of health until approximately 3/6 when she developed progressive fatigue. She has seen her PCP and cardiologist with these complaints without identifiable etiology. It was felt that perhaps polypharmacy was playing a role. Her PRN alprazolam was discontinued and Ambien dose was reduced from 10mg  to 5mg  QHS. On 3/16, early a.m.,  She has developed shortness of breath and chest tightness, presented to [facility].  EKG was felt nonischemic, had atrial fibrillation with RVR.  Patient was started on diltiazem bolus and infusion.  Patient also had low-grade fever 100 F, leukocytosis, placed on empiric antibiotic and sepsis work-up was initiated. Chest x-ray showed left-sided effusion at her former lobectomy site, creatinine 1.3, lactic acid 8.7, troponin 1700, BNP 1682. Patient then became hypotensive and was admitted to ICU.  She was admitted for severe sepsis which was presumed to be due to duodenitis and antibiotics were narrowed, now on IV Zosyn, for 7 to 10 days.  Cardiology was consulted for atrial fibrillation with RVR and abnormal echo changes (worsened MR, TAVR position slightly off). TEE : AVR appears normal.  Patient was transferred to [facility] service on 11/07/2020.  Blood cultures grew actinomyces neuii, infectious disease consulted.  Infectious disease recommended TEE to rule out infection endocarditis. Patient underwent TEE: No vegetation was found.  Infectious disease recommended Penicillin therapy for 1 month followed by oral amoxicillin to reach 6 months to a year of therapy.  Assessment & Plan:   Principal Problem:   Actinomyces infection Active Problems:   S/P AVR (aortic valve  replacement)   Acute respiratory failure with hypoxia (HCC)   Metabolic acidosis   Elevated troponin   Atrial fibrillation with RVR (HCC)   Severe sepsis with septic shock (HCC)   Malnutrition of moderate degree   Acute renal failure (HCC)   Bacteremia   Fatigue   Sepsis with acute renal failure without septic shock (HCC)   Status post thoracentesis  Acute hypoxic respiratory failure secondary to fluid overload: > Improving She was restless and tachypneic 3/22 , ABG : No PCO retention. She was given Ativan and Haldol continues to remain tachypneic. PCCM consulted. She was given Lasix 60 mg IV every 12 hours,  underwent thoracocentesis 0.6L removed.  Appears to be transudate Increased Lasix thrice daily.  She appears much improved. O2 sat 96% on room air.  Severe sepsis presumed to be due to duodenitis, present on admission -Urine culture negative.  Blood culture positive for gram-positive rods, actinomyces neuii -CT renal stone study showed inflammatory changes surrounding the duodenum and adjacent to the head of the pancreas with extension into the region of porta hepatis, concerning for probable duodenitis. -Leukocytosis improving, afebrile -IV antibiotics narrowed to Zosyn (previously on IV vancomycin, cefepime and Flagyl), narrowed down to Unasyn.  -Advance diet to Full liquids to soft blend. -Infectious disease consulted, recommended TEE to rule out infective endocarditis.  TEE shows no vegetations. -Infectious disease recommended penicillin therapy for 1 month followed by oral amoxicillin to reach 6 months to a year of therapy. - She has had PICC Line on 3/23.  Active Problems: Atrial fibrillation with RVR, New onset -Patient was placed on IV Cardizem drip, heart rate improved however patient developed hypotension -Cardiology consulted, heart rate remained  elevated, started Cardizem 30 mg every 6hrs -Patient was placed on IV heparin, now transitioned to apixaban -Elevated  troponin likely due to demand ischemia, atrial fibrillation with RVR, not ACS -2D echo with LVEF of 50 to 55% -Heart rate is controlled.  Patient is currently on Eliquis.  Abnormal EKG changes, worsened MR, TR, TAVR position change -Cardiology following, MR is now moderate to severe, TR is now moderate to severe -Blood cultures grew actinomyces neuii, infectious disease recommended TEE. -Patient underwent TEE, no vegetations found.  Acute kidney injury with metabolic acidosis, lactic acidosis > Resolved. -Baseline creatinine 0.9-1.05 -Presented with creatinine of 1.3, lactic acid 8.7 on admission -Patient was placed on bicarb drip, lactic acid improved to 2.3, bicarb normal 24, creatinine 0.9 -Diet advanced to solids today Acute kidney injury has resolved.  Metabolic acidosis has resolved.  Last CO2 was 24.  COPD -Currently stable, no wheezing, continue outpatient inhalers.  Hypertension -BP currently stable, holding antihypertensives.  Hyperlipidemia Continue statin  Generalized weakness: Patient reports progressive fatigue, dizziness, generalized debility. Patient was seen by her PCP, cardiologist, no identifiable etiology found.   Perhaps polypharmacy could be the reason.  Xanax was discontinued.   Ambien dose decreased from 10 mg to 5 mg, now discontinued -Continue melatonin -Would not resume Xanax outpatient, discussed with patient's daughter -Continue PT OT, mobilization, very deconditioned. PT : Home Health services.  Malnutrition of moderate degree Very deconditioned, diet advanced , albumin 2.0 Estimated body mass index is 30.54 kg/m as calculated from the following:   Height as of this encounter: 5\' 3"  (1.6 m).   Weight as of this encounter: 78.2 kg.   DVT prophylaxis: Eliquis Code Status: Full code. Family Communication:  Daughter at bed side. Disposition Plan:   Status is: Inpatient  Remains inpatient appropriate because:Inpatient level of care  appropriate due to severity of illness   Dispo: The patient is from: Home              Anticipated d/c is to: Home with Home services.              Patient currently is not medically stable to d/c.   Difficult to place patient No  Patient needs few days of IV diuresis, needs [place] services for IV antibiotics after discharge.   Consultants:   Cardiology  Infectious disease.  Procedures: TTE, TEE Antimicrobials:   Anti-infectives (From admission, onward)   Start     Dose/Rate Route Frequency Ordered Stop   11/11/20 1145  penicillin G potassium 12 Million Units in dextrose 5 % 500 mL continuous infusion        12 Million Units 41.7 mL/hr over 12 Hours Intravenous Every 12 hours 11/11/20 1045     11/09/20 1130  Ampicillin-Sulbactam (UNASYN) 3 g in sodium chloride 0.9 % 100 mL IVPB  Status:  Discontinued        3 g 200 mL/hr over 30 Minutes Intravenous Every 6 hours 11/09/20 1025 11/11/20 1045   11/06/20 0600  vancomycin (VANCOREADY) IVPB 750 mg/150 mL  Status:  Discontinued        750 mg 150 mL/hr over 60 Minutes Intravenous Every 24 hours 11/05/20 0757 11/06/20 0745   11/05/20 2000  ceFEPIme (MAXIPIME) 2 g in sodium chloride 0.9 % 100 mL IVPB  Status:  Discontinued        2 g 200 mL/hr over 30 Minutes Intravenous Every 12 hours 11/05/20 0757 11/05/20 1704   11/05/20 1745  piperacillin-tazobactam (ZOSYN) IVPB 3.375 g  Status:  Discontinued        3.375 g 12.5 mL/hr over 240 Minutes Intravenous Every 8 hours 11/05/20 1720 11/09/20 1015   11/05/20 0730  ceFEPIme (MAXIPIME) 2 g in sodium chloride 0.9 % 100 mL IVPB        2 g 200 mL/hr over 30 Minutes Intravenous  Once 11/05/20 0716 11/05/20 0814   11/05/20 0730  metroNIDAZOLE (FLAGYL) IVPB 500 mg        500 mg 100 mL/hr over 60 Minutes Intravenous  Once 11/05/20 0716 11/05/20 0850   11/05/20 0730  vancomycin (VANCOCIN) IVPB 1000 mg/200 mL premix        1,000 mg 200 mL/hr over 60 Minutes Intravenous  Once 11/05/20 0716  11/05/20 0914      Subjective: Patient was seen and examined at bedside.  Overnight events noted. Patient reports feeling better.  She responds appropriately, alert and oriented x 3. She is still slightly tachypneic, Her O2 sat 96% on Room air.  Objective: Vitals:   11/13/20 0400 11/13/20 0550 11/13/20 0727 11/13/20 0822  BP: (!) 106/57 114/60 (!) 122/58   Pulse: 65 86 73   Resp: (!) 21 (!) 21 20   Temp: 97.6 F (36.4 C) 97.8 F (36.6 C)    TempSrc: Oral Oral    SpO2: 94% 97% 94% 96%  Weight:      Height:        Intake/Output Summary (Last 24 hours) at 11/13/2020 1140 Last data filed at 11/13/2020 0300 Gross per 24 hour  Intake 1789.85 ml  Output 800 ml  Net 989.85 ml   Filed Weights   11/05/20 0707 11/05/20 1002 11/07/20 0500  Weight: 76.2 kg 78.2 kg 78.2 kg    Examination:  General exam: Appears calm and comfortable. Not in any acute distress. Respiratory system: Clear to auscultation. Respiratory effort normal. Cardiovascular system: S1 & S 2 heard, RRR. No JVD, murmurs, rubs, gallops or clicks. No pedal edema. Gastrointestinal system: Abdomen is nondistended, soft and nontender.  No organomegaly or masses felt. Normal bowel sounds heard. Central nervous system: Alert and oriented. No focal neurological deficits. Extremities: Symmetric 5 x 5 power.  No edema, no cyanosis, no clubbing. Skin: No rashes, lesions or ulcers Psychiatry: Judgement and insight appear normal. Mood & affect appropriate.     Data Reviewed: I have personally reviewed following labs and imaging studies  CBC: Recent Labs  Lab 11/09/20 0114 11/10/20 0215 11/11/20 0101 11/12/20 0308 11/13/20 0500  WBC 11.8* 11.2* 13.5* 9.0 9.8  HGB 11.2* 10.7* 11.6* 11.1* 10.7*  HCT 33.0* 32.6* 35.7* 33.0* 31.4*  MCV 88.7 89.6 91.1 89.9 88.7  PLT 170 185 209 250 536   Basic Metabolic Panel: Recent Labs  Lab 11/09/20 0114 11/10/20 1019 11/11/20 0101 11/13/20 0500  NA 130* 129* 132* 132*  K  3.7 3.6 3.8 3.0*  CL 100 102 104 95*  CO2 22 21* 22 30  GLUCOSE 122* 231* 151* 142*  BUN 9 6* 7* 5*  CREATININE 0.77 0.82 0.75 0.88  CALCIUM 7.8* 7.7* 8.1* 8.2*  MG 1.9  --  2.0  --   PHOS 3.3  --  3.6  --    GFR: Estimated Creatinine Clearance: 47.9 mL/min (by C-G formula based on SCr of 0.88 mg/dL). Liver Function Tests: Recent Labs  Lab 11/07/20 0117 11/08/20 0159 11/09/20 0114 11/10/20 0215  AST 43* 23 21 34  ALT 50* 35 27 34  ALKPHOS 110 100 93 86  BILITOT 1.7* 1.5* 1.3*  1.3*  PROT 5.0* 5.3* 5.5* 5.4*  ALBUMIN 2.0* 2.0* 1.9*  1.9* 1.9*   No results for input(s): LIPASE, AMYLASE in the last 168 hours. No results for input(s): AMMONIA in the last 168 hours. Coagulation Profile: No results for input(s): INR, PROTIME in the last 168 hours. Cardiac Enzymes: No results for input(s): CKTOTAL, CKMB, CKMBINDEX, TROPONINI in the last 168 hours. BNP (last 3 results) No results for input(s): PROBNP in the last 8760 hours. HbA1C: No results for input(s): HGBA1C in the last 72 hours. CBG: No results for input(s): GLUCAP in the last 168 hours. Lipid Profile: No results for input(s): CHOL, HDL, LDLCALC, TRIG, CHOLHDL, LDLDIRECT in the last 72 hours. Thyroid Function Tests: No results for input(s): TSH, T4TOTAL, FREET4, T3FREE, THYROIDAB in the last 72 hours. Anemia Panel: No results for input(s): VITAMINB12, FOLATE, FERRITIN, TIBC, IRON, RETICCTPCT in the last 72 hours. Sepsis Labs: No results for input(s): PROCALCITON, LATICACIDVEN in the last 168 hours.  Recent Results (from the past 240 hour(s))  Resp Panel by RT-PCR (Flu A&B, Covid) Nasopharyngeal Swab     Status: None   Collection Time: 11/05/20  7:19 AM   Specimen: Nasopharyngeal Swab; Nasopharyngeal(NP) swabs in vial transport medium  Result Value Ref Range Status   SARS Coronavirus 2 by RT PCR NEGATIVE NEGATIVE Final    Comment: (NOTE) SARS-CoV-2 target nucleic acids are NOT DETECTED.  The SARS-CoV-2 RNA is  generally detectable in upper respiratory specimens during the acute phase of infection. The lowest concentration of SARS-CoV-2 viral copies this assay can detect is 138 copies/mL. A negative result does not preclude SARS-Cov-2 infection and should not be used as the sole basis for treatment or other patient management decisions. A negative result may occur with  improper specimen collection/handling, submission of specimen other than nasopharyngeal swab, presence of viral mutation(s) within the areas targeted by this assay, and inadequate number of viral copies(<138 copies/mL). A negative result must be combined with clinical observations, patient history, and epidemiological information. The expected result is Negative.  Fact Sheet for Patients:  EntrepreneurPulse.com.au  Fact Sheet for Healthcare Providers:  IncredibleEmployment.be  This test is no t yet approved or cleared by the [place] FDA and  has been authorized for detection and/or diagnosis of SARS-CoV-2 by FDA under an Emergency Use Authorization (EUA). This EUA will remain  in effect (meaning this test can be used) for the duration of the COVID-19 declaration under Section 564(b)(1) of the Act, 21 U.S.C.section 360bbb-3(b)(1), unless the authorization is terminated  or revoked sooner.       Influenza A by PCR NEGATIVE NEGATIVE Final   Influenza B by PCR NEGATIVE NEGATIVE Final    Comment: (NOTE) The Xpert Xpress SARS-CoV-2/FLU/RSV plus assay is intended as an aid in the diagnosis of influenza from Nasopharyngeal swab specimens and should not be used as a sole basis for treatment. Nasal washings and aspirates are unacceptable for Xpert Xpress SARS-CoV-2/FLU/RSV testing.  Fact Sheet for Patients: EntrepreneurPulse.com.au  Fact Sheet for Healthcare Providers: IncredibleEmployment.be  This test is not yet approved or cleared by the [place] FDA and has been authorized for detection and/or diagnosis of SARS-CoV-2 by FDA under an Emergency Use Authorization (EUA). This EUA will remain in effect (meaning this test can be used) for the duration of the COVID-19 declaration under Section 564(b)(1) of the Act, 21 U.S.C. section 360bbb-3(b)(1), unless the authorization is terminated or revoked.  Performed at [facility], [address] [place], [place]  [address]   Urine culture     Status: None   Collection Time: 11/05/20  7:26 AM   Specimen: In/Out Cath Urine  Result Value Ref Range Status   Specimen Description   Final    IN/OUT CATH URINE Performed at [facility], [address] [place], [place] [address]    Special Requests   Final    NONE Performed at [facility], [address] [place], [place] [address]    Culture   Final    NO GROWTH Performed at [facility], [place] [address], [place], [place] [address]    Report Status 11/06/2020 FINAL  Final  Blood Culture (routine x 2)     Status: Abnormal   Collection Time: 11/05/20  7:27 AM   Specimen: BLOOD RIGHT HAND  Result Value Ref Range Status   Specimen Description   Final    BLOOD RIGHT HAND Performed at [facility], [address] [place], [place] [address]    Special Requests   Final    BOTTLES DRAWN AEROBIC AND ANAEROBIC Blood Culture adequate volume Performed at [facility], [address] [place], [place] [address]    Culture  Setup Time   Final    GRAM POSITIVE RODS IN BOTH AEROBIC AND ANAEROBIC BOTTLES CRITICAL RESULT CALLED TO, READ BACK BY AND VERIFIED WITH: PHARMD HALEY B. 1936 [id number] FCP    Culture (A)  Final    ACTINOMYCES NEUII Standardized susceptibility testing for this organism is not available. Performed at [facility], [place] [address], [place], [place] [address]    Report Status 11/09/2020 FINAL  Final  MRSA PCR Screening     Status: None   Collection  Time: 11/05/20 11:19 AM   Specimen: Nasal Mucosa; Nasopharyngeal  Result Value Ref Range Status   MRSA by PCR NEGATIVE NEGATIVE Final    Comment:        The GeneXpert MRSA Assay (FDA approved for NASAL specimens only), is one component of a comprehensive MRSA colonization surveillance program. It is not intended to diagnose MRSA infection nor to guide or monitor treatment for MRSA infections. Performed at [facility], [place] [address] [place], [place] [address]   Culture, blood (routine x 2)     Status: None (Preliminary result)   Collection Time: 11/09/20 10:16 AM   Specimen: BLOOD LEFT HAND  Result Value Ref Range Status   Specimen Description BLOOD LEFT HAND  Final   Special Requests   Final    BOTTLES DRAWN AEROBIC ONLY Blood Culture results may not be optimal due to an inadequate volume of blood received in culture bottles   Culture   Final    NO GROWTH 4 DAYS Performed at [facility], [place] [address], [place], [place] [address]    Report Status PENDING  Incomplete  Culture, blood (routine x 2)     Status: None (Preliminary result)   Collection Time: 11/09/20 10:24 AM   Specimen: BLOOD RIGHT HAND  Result Value Ref Range Status   Specimen Description BLOOD RIGHT HAND  Final   Special Requests   Final    BOTTLES DRAWN AEROBIC ONLY Blood Culture adequate volume   Culture   Final    NO GROWTH 4 DAYS Performed at [facility], [place] [address] [place], [place] [address]    Report Status PENDING  Incomplete  Body fluid culture w Gram  Stain     Status: None (Preliminary result)   Collection Time: 11/11/20  3:49 PM   Specimen: Fluid  Result Value Ref Range Status   Specimen Description FLUID LEFT PLEURAL  Final   Special Requests NONE  Final   Gram Stain   Final    RARE WBC PRESENT, PREDOMINANTLY MONONUCLEAR NO ORGANISMS SEEN    Culture   Final    NO GROWTH 2 DAYS Performed at [facility], [address] [place], [place] [address]    Report  Status PENDING  Incomplete    Radiology Studies: CT MAXILLOFACIAL W CONTRAST  Result Date: 11/11/2020 CLINICAL DATA:  Actinomyces bacteremia EXAM: CT MAXILLOFACIAL WITH CONTRAST TECHNIQUE: Multidetector CT imaging of the maxillofacial structures was performed with intravenous contrast. Multiplanar CT image reconstructions were also generated. CONTRAST:  27mL OMNIPAQUE IOHEXOL 300 MG/ML  SOLN COMPARISON:  None. FINDINGS: Osseous: No facial fracture or focal osseous abnormality. Orbits: The globes are intact. Normal appearance of the intra- and extraconal fat. Symmetric extraocular muscles. Sinuses: No fluid levels or advanced mucosal thickening. Soft tissues: Normal visualized extracranial soft tissues. Limited intracranial: Normal. IMPRESSION: Paranasal sinuses clear. Electronically Signed   By: Ulyses Jarred M.D.   On: 11/11/2020 22:41   DG Chest Port 1 View  Result Date: 11/12/2020 CLINICAL DATA:  Status post PICC line placement EXAM: PORTABLE CHEST 1 VIEW COMPARISON:  11/11/2020 FINDINGS: Cardiac shadow is stable. Changes of prior TAVR and median sternotomy are again seen and stable. New right-sided PICC line is noted with the catheter tip at the cavoatrial junction in satisfactory position. Small bilateral pleural effusions are again identified right greater than left. No new focal abnormality is seen. IMPRESSION: Right-sided PICC line in satisfactory position as described. The remainder of the exam is stable from the previous day. Electronically Signed   By: Inez Catalina M.D.   On: 11/12/2020 12:32   DG CHEST PORT 1 VIEW  Result Date: 11/11/2020 CLINICAL DATA:  Status post thoracentesis EXAM: PORTABLE CHEST 1 VIEW COMPARISON:  Earlier today at 3:08 p.m. FINDINGS: 3:44 p.m. Right larger than left pleural effusions. The left-sided effusion is likely smaller. No pneumothorax. Midline trachea. Mild cardiomegaly with transverse aortic atherosclerosis. Mild interstitial edema. Minimal improvement in  bibasilar airspace disease. IMPRESSION: No pneumothorax or other complication after thoracentesis. Congestive heart failure with minimal improvement in bibasilar airspace disease. Aortic Atherosclerosis (ICD10-I70.0). Electronically Signed   By: Abigail Miyamoto M.D.   On: 11/11/2020 16:16   DG CHEST PORT 1 VIEW  Result Date: 11/11/2020 CLINICAL DATA:  Tachypnea. Hypertension. Prior aortic valve replacement. EXAM: PORTABLE CHEST 1 VIEW COMPARISON:  11/06/2020 FINDINGS: Stable cardiomegaly. Prior CABG and TAVR again noted. Increased diffuse interstitial infiltrates consistent with interstitial edema. Small to moderate bilateral pleural effusions are seen, left side greater than right, also new since previous study. IMPRESSION: Increased diffuse interstitial edema.  Stable cardiomegaly. Increased small to moderate bilateral pleural effusions and bibasilar atelectasis. Electronically Signed   By: Marlaine Hind M.D.   On: 11/11/2020 15:40   [place] EKG SITE RITE  Result Date: 11/11/2020 If [facility] image not attached, placement could not be confirmed due to current cardiac rhythm.  Scheduled Meds: . apixaban  5 mg Oral BID  . aspirin EC  81 mg Oral Daily  . Chlorhexidine Gluconate Cloth  6 each Topical Daily  . diltiazem  30 mg Oral Q6H  . feeding supplement  1 Container Oral TID BM  . fluticasone furoate-vilanterol  1 puff Inhalation  Daily  . furosemide  60 mg Intravenous Q6H  . loratadine  10 mg Oral Daily  . melatonin  3 mg Oral QHS  . nystatin  5 mL Oral QID  . potassium chloride  40 mEq Oral Once  . rosuvastatin  20 mg Oral Daily  . sodium chloride flush  10-40 mL Intracatheter Q12H  . umeclidinium bromide  1 puff Inhalation Daily   Continuous Infusions: . sodium chloride Stopped (11/05/20 0847)  . penicillin g continuous IV infusion 12 Million Units (11/13/20 0040)     LOS: 8 days    Time spent: 25 mins    Rickie Gutierres, MD [facility]   If 7PM-7AM, please contact  night-coverage

## 2020-11-13 NOTE — Progress Notes (Signed)
Physical Therapy Treatment Patient Details Name: Tracy Buck MRN: [id number] DOB: [birth year] Today's Date: 11/13/2020    History of Present Illness 86 yo admitted to [facility] 3/16 with sepsis and hypoxia requiring Bipap with transfer to [facility] pt with Afib with RVR. 3/22 Moderate severe MR and TR by TEE; 3/22  L thoracentesis  PMhx: COPD, HTN, TAVR, CAD, lung CA s/p lobectomy, HLD    PT Comments    Pt making steady progress with mobility. Able to amb today without supplemental O2. Progressing toward return home with family.   Follow Up Recommendations  Home health PT;Supervision/Assistance - 24 hour     Equipment Recommendations  None recommended by PT    Recommendations for Other Services       Precautions / Restrictions Precautions Precautions: Fall Precaution Comments: monitor sats    Mobility  Bed Mobility               General bed mobility comments: Pt up on [facility] for bath    Transfers Overall transfer level: Needs assistance Equipment used: Rolling walker (2 wheeled);None Transfers: Sit to/from [employer] to Stand: Min guard Stand pivot transfers: Min assist       General transfer comment: Assist for safety and balance. BSC to bed with stand pivot without assistive device  Ambulation/Gait Ambulation/Gait assistance: Min assist Gait Distance (Feet): 60 Feet Assistive device: Rolling walker (2 wheeled) Gait Pattern/deviations: Step-through pattern;Decreased stride length;Wide base of support;Trunk flexed Gait velocity: decreased Gait velocity interpretation: <1.31 ft/sec, indicative of household ambulator General Gait Details: Assist for balance   Stairs             Wheelchair Mobility    Modified Rankin (Stroke Patients Only)       Balance Overall balance assessment: Needs assistance Sitting-balance support: No upper extremity supported;Feet supported Sitting balance-Leahy Scale: Fair Sitting balance -  Comments: pt able to sit EOB without UE support   Standing balance support: During functional activity;No upper extremity supported Standing balance-Leahy Scale: Fair                              Cognition Arousal/Alertness: Awake/alert Behavior During Therapy: WFL for tasks assessed/performed Overall Cognitive Status: Within Functional Limits for tasks assessed                                        Exercises      General Comments General comments (skin integrity, edema, etc.): SpO2 94% on RA with ambulation      Pertinent Vitals/Pain Pain Assessment: No/denies pain    Home Living                      Prior Function            PT Goals (current goals can now be found in the care plan section) Acute Rehab PT Goals Patient Stated Goal: home Progress towards PT goals: Progressing toward goals    Frequency    Min 3X/week      PT Plan Current plan remains appropriate    Co-evaluation              AM-PAC PT "6 Clicks" Mobility   Outcome Measure  Help needed turning from your back to your side while in a flat bed without using bedrails?: A Little Help  needed moving from lying on your back to sitting on the side of a flat bed without using bedrails?: A Little Help needed moving to and from a bed to a chair (including a wheelchair)?: A Little Help needed standing up from a chair using your arms (e.g., wheelchair or bedside chair)?: A Little Help needed to walk in hospital room?: A Little Help needed climbing 3-5 steps with a railing? : A Lot 6 Click Score: 17    End of Session Equipment Utilized During Treatment: Gait belt Activity Tolerance: Patient tolerated treatment well Patient left: in chair;with call bell/phone within reach;with chair alarm set Nurse Communication: Other (comment) (Nurse tech replaced purewick) PT Visit Diagnosis: Other abnormalities of gait and mobility (R26.89);Difficulty in walking, not  elsewhere classified (R26.2);Muscle weakness (generalized) (M62.81)     Time: 2500-3704 PT Time Calculation (min) (ACUTE ONLY): 18 min  Charges:  $Gait Training: 8-22 mins                     [place] Pager [phone] Office [place] 11/13/2020, 3:12 PM

## 2020-11-13 NOTE — Progress Notes (Signed)
Nutrition Follow-up  DOCUMENTATION CODES:   Non-severe (moderate) malnutrition in context of acute illness/injury  INTERVENTION:  -Advance diet to full liquids, approved by MD. MD also states diet can be further advanced to soft once pt is ready -d/c Boost Breeze -Ensure Enlive po TID, each supplement provides 350 kcal and 20 grams of protein -Magic cup TID with meals, each supplement provides 290 kcal and 9 grams of protein -66ml Prosource Plus po BID, each supplement provides 100 kcals and 15 grams of protein -MVI with minerals daily  NUTRITION DIAGNOSIS:   Moderate Malnutrition related to acute illness (sepsis 2/2 duodenitis) as evidenced by mild fat depletion,mild muscle depletion,energy intake < 75% for > 7 days. -- ongoing  GOAL:   Patient will meet greater than or equal to 90% of their needs -- progressing  MONITOR:   PO intake,Supplement acceptance,Diet advancement,Weight trends  REASON FOR ASSESSMENT:   Malnutrition Screening Tool    ASSESSMENT:   86 year old female who presented to the ED on 3/16 with SOB. PMH of COPD, HTN, HLD, bioprosthetic aortic valve (2021), CAD, and lung cancer s/p left lower lobectomy. Pt admitted with concerns of sepsis secondary to duodenitis and requiring BiPAP for hypoxia.  3/16 - admit to ICU with working diagnosis sepsis 3/17 - tx to floor; diet advanced to clears 3/18 - diet advanced to heart healthy 3/20 - ID consult for Actinomyces 3/22 - PCCM reconsulted for worsening hypoxia, TEE, thoracentesis with removal of 600cc on L, diuresis to address R effusion  Pt was advanced back to clear liquid diet on 3/22 but has not had any PO intake documented since then. When pt was on a Heart Healthy diet, she consumed an average of 60% of her meals and, per pt's daughter, pt was a good eater PTA, so hopeful that pt will be able to improve appetite as symptoms improve. Pt reports she is sick of clear liquid trays and expresses interest in diet  advancement. Discussed with MD who has approved advancement to full liquids then soft. Discussed supplement options with pt/daughter in addition to information for nutrition upon discharge.   UOP: 871ml x24 hours  Medications: lasix, klor-con Labs: Na 132 (L), K+ 3.0 (L)  Diet Order:   Diet Order            Diet full liquid Room service appropriate? Yes with Assist; Fluid consistency: Thin  Diet effective now                 EDUCATION NEEDS:   No education needs have been identified at this time  Skin:  Skin Assessment: Reviewed RN Assessment  Last BM:  3/19 type 2  Height:   Ht Readings from Last 1 Encounters:  11/05/20 5\' 3"  (1.6 m)    Weight:   Wt Readings from Last 1 Encounters:  11/07/20 78.2 kg   BMI:  Body mass index is 30.54 kg/m.  Estimated Nutritional Needs:   Kcal:  1600-1800  Protein:  80-95 grams  Fluid:  1.6-1.8 L    Larkin Ina, MS, RD, LDN RD pager number and weekend/on-call pager number located in [place].

## 2020-11-13 NOTE — Progress Notes (Signed)
Subjective: Patient continues to feel better   Antibiotics:  Anti-infectives (From admission, onward)   Start     Dose/Rate Route Frequency Ordered Stop   11/11/20 1145  penicillin G potassium 12 Million Units in dextrose 5 % 500 mL continuous infusion        12 Million Units 41.7 mL/hr over 12 Hours Intravenous Every 12 hours 11/11/20 1045     11/09/20 1130  Ampicillin-Sulbactam (UNASYN) 3 g in sodium chloride 0.9 % 100 mL IVPB  Status:  Discontinued        3 g 200 mL/hr over 30 Minutes Intravenous Every 6 hours 11/09/20 1025 11/11/20 1045   11/06/20 0600  vancomycin (VANCOREADY) IVPB 750 mg/150 mL  Status:  Discontinued        750 mg 150 mL/hr over 60 Minutes Intravenous Every 24 hours 11/05/20 0757 11/06/20 0745   11/05/20 2000  ceFEPIme (MAXIPIME) 2 g in sodium chloride 0.9 % 100 mL IVPB  Status:  Discontinued        2 g 200 mL/hr over 30 Minutes Intravenous Every 12 hours 11/05/20 0757 11/05/20 1704   11/05/20 1745  piperacillin-tazobactam (ZOSYN) IVPB 3.375 g  Status:  Discontinued        3.375 g 12.5 mL/hr over 240 Minutes Intravenous Every 8 hours 11/05/20 1720 11/09/20 1015   11/05/20 0730  ceFEPIme (MAXIPIME) 2 g in sodium chloride 0.9 % 100 mL IVPB        2 g 200 mL/hr over 30 Minutes Intravenous  Once 11/05/20 0716 11/05/20 0814   11/05/20 0730  metroNIDAZOLE (FLAGYL) IVPB 500 mg        500 mg 100 mL/hr over 60 Minutes Intravenous  Once 11/05/20 0716 11/05/20 0850   11/05/20 0730  vancomycin (VANCOCIN) IVPB 1000 mg/200 mL premix        1,000 mg 200 mL/hr over 60 Minutes Intravenous  Once 11/05/20 0716 11/05/20 0914      Medications: Scheduled Meds: . apixaban  5 mg Oral BID  . aspirin EC  81 mg Oral Daily  . Chlorhexidine Gluconate Cloth  6 each Topical Daily  . diltiazem  30 mg Oral Q6H  . feeding supplement  1 Container Oral TID BM  . fluticasone furoate-vilanterol  1 puff Inhalation Daily  . furosemide  60 mg Intravenous Q6H  . loratadine  10 mg  Oral Daily  . melatonin  3 mg Oral QHS  . nystatin  5 mL Oral QID  . potassium chloride  40 mEq Oral Once  . rosuvastatin  20 mg Oral Daily  . sodium chloride flush  10-40 mL Intracatheter Q12H  . umeclidinium bromide  1 puff Inhalation Daily   Continuous Infusions: . sodium chloride Stopped (11/05/20 0847)  . penicillin g continuous IV infusion 12 Million Units (11/13/20 0040)   PRN Meds:.sodium chloride, acetaminophen, diphenhydrAMINE, docusate sodium, fentaNYL (SUBLIMAZE) injection, haloperidol lactate, ipratropium-albuterol, LORazepam, polyethylene glycol, sodium chloride flush    Objective: Weight change:   Intake/Output Summary (Last 24 hours) at 11/13/2020 1203 Last data filed at 11/13/2020 0300 Gross per 24 hour  Intake 1789.85 ml  Output 800 ml  Net 989.85 ml   Blood pressure (!) 122/58, pulse 73, temperature 97.8 F (36.6 C), temperature source Oral, resp. rate 20, height 5' 3"  (1.6 m), weight 78.2 kg, SpO2 96 %. Temp:  [97.6 F (36.4 C)-98.5 F (36.9 C)] 97.8 F (36.6 C) (03/24 0550) Pulse Rate:  [65-98] 73 (03/24 0727) Resp:  [  19-35] 20 (03/24 0727) BP: (106-126)/(57-82) 122/58 (03/24 0727) SpO2:  [94 %-98 %] 96 % (03/24 0626)  Physical Exam: Physical Exam Constitutional:      General: She is not in acute distress.    Appearance: She is not diaphoretic.  HENT:     Head: Normocephalic and atraumatic.     Right Ear: External ear normal.     Left Ear: External ear normal.     Mouth/Throat:     Pharynx: No oropharyngeal exudate.  Eyes:     General: No scleral icterus.    Conjunctiva/sclera: Conjunctivae normal.     Pupils: Pupils are equal, round, and reactive to light.  Cardiovascular:     Rate and Rhythm: Normal rate. Rhythm irregular.     Heart sounds: Murmur heard.  No friction rub. No gallop.   Pulmonary:     Effort: Pulmonary effort is normal. No respiratory distress.     Breath sounds: Normal breath sounds. No stridor. No wheezing, rhonchi or  rales.  Abdominal:     General: Bowel sounds are normal. There is no distension.     Palpations: Abdomen is soft.     Tenderness: There is no abdominal tenderness. There is no rebound.  Musculoskeletal:        General: No tenderness. Normal range of motion.  Lymphadenopathy:     Cervical: No cervical adenopathy.  Skin:    General: Skin is warm and dry.     Findings: No erythema or rash.  Neurological:     General: No focal deficit present.     Mental Status: She is oriented to person, place, and time.     Motor: No abnormal muscle tone.     Coordination: Coordination normal.  Psychiatric:        Mood and Affect: Mood normal.        Behavior: Behavior normal.        Thought Content: Thought content normal.        Judgment: Judgment normal.      CBC:    BMET Recent Labs    11/11/20 0101 11/13/20 0500  NA 132* 132*  K 3.8 3.0*  CL 104 95*  CO2 22 30  GLUCOSE 151* 142*  BUN 7* 5*  CREATININE 0.75 0.88  CALCIUM 8.1* 8.2*     Liver Panel  No results for input(s): PROT, ALBUMIN, AST, ALT, ALKPHOS, BILITOT, BILIDIR, IBILI in the last 72 hours.     Sedimentation Rate No results for input(s): ESRSEDRATE in the last 72 hours. C-Reactive Protein No results for input(s): CRP in the last 72 hours.  Micro Results: Recent Results (from the past 720 hour(s))  Resp Panel by RT-PCR (Flu A&B, Covid) Nasopharyngeal Swab     Status: None   Collection Time: 11/05/20  7:19 AM   Specimen: Nasopharyngeal Swab; Nasopharyngeal(NP) swabs in vial transport medium  Result Value Ref Range Status   SARS Coronavirus 2 by RT PCR NEGATIVE NEGATIVE Final    Comment: (NOTE) SARS-CoV-2 target nucleic acids are NOT DETECTED.  The SARS-CoV-2 RNA is generally detectable in upper respiratory specimens during the acute phase of infection. The lowest concentration of SARS-CoV-2 viral copies this assay can detect is 138 copies/mL. A negative result does not preclude SARS-Cov-2 infection and  should not be used as the sole basis for treatment or other patient management decisions. A negative result may occur with  improper specimen collection/handling, submission of specimen other than nasopharyngeal swab, presence of viral mutation(s) within the areas  targeted by this assay, and inadequate number of viral copies(<138 copies/mL). A negative result must be combined with clinical observations, patient history, and epidemiological information. The expected result is Negative.  Fact Sheet for Patients:  EntrepreneurPulse.com.au  Fact Sheet for Healthcare Providers:  IncredibleEmployment.be  This test is no t yet approved or cleared by the [place] FDA and  has been authorized for detection and/or diagnosis of SARS-CoV-2 by FDA under an Emergency Use Authorization (EUA). This EUA will remain  in effect (meaning this test can be used) for the duration of the COVID-19 declaration under Section 564(b)(1) of the Act, 21 U.S.C.section 360bbb-3(b)(1), unless the authorization is terminated  or revoked sooner.       Influenza A by PCR NEGATIVE NEGATIVE Final   Influenza B by PCR NEGATIVE NEGATIVE Final    Comment: (NOTE) The Xpert Xpress SARS-CoV-2/FLU/RSV plus assay is intended as an aid in the diagnosis of influenza from Nasopharyngeal swab specimens and should not be used as a sole basis for treatment. Nasal washings and aspirates are unacceptable for Xpert Xpress SARS-CoV-2/FLU/RSV testing.  Fact Sheet for Patients: EntrepreneurPulse.com.au  Fact Sheet for Healthcare Providers: IncredibleEmployment.be  This test is not yet approved or cleared by the [place] FDA and has been authorized for detection and/or diagnosis of SARS-CoV-2 by FDA under an Emergency Use Authorization (EUA). This EUA will remain in effect (meaning this test can be used) for the duration of the COVID-19 declaration  under Section 564(b)(1) of the Act, 21 U.S.C. section 360bbb-3(b)(1), unless the authorization is terminated or revoked.  Performed at [facility], [address] [place], [place] [address]   Urine culture     Status: None   Collection Time: 11/05/20  7:26 AM   Specimen: In/Out Cath Urine  Result Value Ref Range Status   Specimen Description   Final    IN/OUT CATH URINE Performed at [facility], [address] [place], [place] [address]    Special Requests   Final    NONE Performed at [facility], [address] [place], [place] [address]    Culture   Final    NO GROWTH Performed at [facility], [place] [address], [place], [place] [address]    Report Status 11/06/2020 FINAL  Final  Blood Culture (routine x 2)     Status: Abnormal   Collection Time: 11/05/20  7:27 AM   Specimen: BLOOD RIGHT HAND  Result Value Ref Range Status   Specimen Description   Final    BLOOD RIGHT HAND Performed at [facility], [address] [place], [place] [address]    Special Requests   Final    BOTTLES DRAWN AEROBIC AND ANAEROBIC Blood Culture adequate volume Performed at [facility], [address] [place], [place] [address]    Culture  Setup Time   Final    GRAM POSITIVE RODS IN BOTH AEROBIC AND ANAEROBIC BOTTLES CRITICAL RESULT CALLED TO, READ BACK BY AND VERIFIED WITH: PHARMD HALEY B. 1936 [id number] FCP    Culture (A)  Final    ACTINOMYCES NEUII Standardized susceptibility testing for this organism is not available. Performed at [facility], [place] [address] [place], [place] [address]    Report Status 11/09/2020 FINAL  Final  MRSA PCR Screening     Status: None   Collection Time: 11/05/20 11:19 AM   Specimen: Nasal Mucosa; Nasopharyngeal  Result Value  Ref Range Status   MRSA by PCR NEGATIVE NEGATIVE Final    Comment:        The GeneXpert MRSA Assay (FDA approved for NASAL specimens only), is one component of  a comprehensive MRSA colonization surveillance program. It is not intended to diagnose MRSA infection nor to guide or monitor treatment for MRSA infections. Performed at [facility], [place] [address], [place], [place] [address]   Culture, blood (routine x 2)     Status: None (Preliminary result)   Collection Time: 11/09/20 10:16 AM   Specimen: BLOOD LEFT HAND  Result Value Ref Range Status   Specimen Description BLOOD LEFT HAND  Final   Special Requests   Final    BOTTLES DRAWN AEROBIC ONLY Blood Culture results may not be optimal due to an inadequate volume of blood received in culture bottles   Culture   Final    NO GROWTH 4 DAYS Performed at [facility], [place] [address] [place], [place] [address]    Report Status PENDING  Incomplete  Culture, blood (routine x 2)     Status: None (Preliminary result)   Collection Time: 11/09/20 10:24 AM   Specimen: BLOOD RIGHT HAND  Result Value Ref Range Status   Specimen Description BLOOD RIGHT HAND  Final   Special Requests   Final    BOTTLES DRAWN AEROBIC ONLY Blood Culture adequate volume   Culture   Final    NO GROWTH 4 DAYS Performed at [facility], [place] [address], [place], [place] [address]    Report Status PENDING  Incomplete  Body fluid culture w Gram Stain     Status: None (Preliminary result)   Collection Time: 11/11/20  3:49 PM   Specimen: Fluid  Result Value Ref Range Status   Specimen Description FLUID LEFT PLEURAL  Final   Special Requests NONE  Final   Gram Stain   Final    RARE WBC PRESENT, PREDOMINANTLY MONONUCLEAR NO ORGANISMS SEEN    Culture   Final    NO GROWTH 2 DAYS Performed at [facility], [place] [address] [place], [place] [address]    Report Status PENDING  Incomplete    Studies/Results: CT MAXILLOFACIAL W CONTRAST  Result Date: 11/11/2020 CLINICAL DATA:  Actinomyces bacteremia EXAM: CT MAXILLOFACIAL WITH CONTRAST TECHNIQUE: Multidetector CT imaging of the maxillofacial  structures was performed with intravenous contrast. Multiplanar CT image reconstructions were also generated. CONTRAST:  14m OMNIPAQUE IOHEXOL 300 MG/ML  SOLN COMPARISON:  None. FINDINGS: Osseous: No facial fracture or focal osseous abnormality. Orbits: The globes are intact. Normal appearance of the intra- and extraconal fat. Symmetric extraocular muscles. Sinuses: No fluid levels or advanced mucosal thickening. Soft tissues: Normal visualized extracranial soft tissues. Limited intracranial: Normal. IMPRESSION: Paranasal sinuses clear. Electronically Signed   By: KUlyses JarredM.D.   On: 11/11/2020 22:41   DG Chest Port 1 View  Result Date: 11/12/2020 CLINICAL DATA:  Status post PICC line placement EXAM: PORTABLE CHEST 1 VIEW COMPARISON:  11/11/2020 FINDINGS: Cardiac shadow is stable. Changes of prior TAVR and median sternotomy are again seen and stable. New right-sided PICC line is noted with the catheter tip at the cavoatrial junction in satisfactory position. Small bilateral pleural effusions are again identified right greater than left. No new focal abnormality is seen. IMPRESSION: Right-sided PICC line in satisfactory position as described. The remainder of the exam is stable from the previous day. Electronically Signed   By: MInez Catalina  M.D.   On: 11/12/2020 12:32   DG CHEST PORT 1 VIEW  Result Date: 11/11/2020 CLINICAL DATA:  Status post thoracentesis EXAM: PORTABLE CHEST 1 VIEW COMPARISON:  Earlier today at 3:08 p.m. FINDINGS: 3:44 p.m. Right larger than left pleural effusions. The left-sided effusion is likely smaller. No pneumothorax. Midline trachea. Mild cardiomegaly with transverse aortic atherosclerosis. Mild interstitial edema. Minimal improvement in bibasilar airspace disease. IMPRESSION: No pneumothorax or other complication after thoracentesis. Congestive heart failure with minimal improvement in bibasilar airspace disease. Aortic Atherosclerosis (ICD10-I70.0). Electronically Signed    By: Abigail Miyamoto M.D.   On: 11/11/2020 16:16   DG CHEST PORT 1 VIEW  Result Date: 11/11/2020 CLINICAL DATA:  Tachypnea. Hypertension. Prior aortic valve replacement. EXAM: PORTABLE CHEST 1 VIEW COMPARISON:  11/06/2020 FINDINGS: Stable cardiomegaly. Prior CABG and TAVR again noted. Increased diffuse interstitial infiltrates consistent with interstitial edema. Small to moderate bilateral pleural effusions are seen, left side greater than right, also new since previous study. IMPRESSION: Increased diffuse interstitial edema.  Stable cardiomegaly. Increased small to moderate bilateral pleural effusions and bibasilar atelectasis. Electronically Signed   By: Marlaine Hind M.D.   On: 11/11/2020 15:40      Assessment/Plan:  INTERVAL HISTORY: She is now off oxygen  Principal Problem:   Actinomyces infection Active Problems:   S/P AVR (aortic valve replacement)   Acute respiratory failure with hypoxia (HCC)   Metabolic acidosis   Elevated troponin   Atrial fibrillation with RVR (HCC)   Severe sepsis with septic shock (HCC)   Malnutrition of moderate degree   Acute renal failure (HCC)   Bacteremia   Fatigue   Sepsis with acute renal failure without septic shock ([place])   Status post thoracentesis    Nautica Hotz is a 86 y.o. 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  CT scan shows possible duodenitis. CT maxillofacial was unrevealing transesophageal echocardiogram was negative for vegetations  She is now on IV penicillin with a PICC line   Thrush: Continue nystatin swish and swallow.\  Pleural effusions: Appear to be transudative  Diagnosis: Actinomyces bacteremia  Culture Result: Actinomyces  Allergies  Allergen Reactions  . Fig Extract [Ficus] Anaphylaxis  . Citalopram Hydrobromide Other (See Comments)    Pt  unsure if this is accurate  . Sulfa Antibiotics Other (See Comments)    Severe nervousness.  . Prednisone Other (See Comments)    Other reaction(s): Other (See Comments) So nervous I could climb the walls So nervous I could climb the walls     OPAT Orders Discharge antibiotics:  Penicillin G 24 million units administered as a continuous infusion   Duration:  4 weeks End Date:  December 07, 2020  [facility] Care Per Protocol:   Labs  weekly while on IV antibiotics: x__ CBC with differential _x_ BMP w GFR/CMP _x_ CRP _x_ ESR   _x_ Please pull PIC at completion of IV antibiotics __ Please leave PIC in place until doctor has seen patient or been notified  Fax weekly labs to [phone]  Clinic Follow Up Appt:      Rozella Servello has an appointment on 11/28/2020 at 1045 AM with Dr. Tommy Medal  The [facility] is located in the [facility] at  [place] in [place].  [address], which is located to the left of  the elevators.  Phone: [phone]  Fax: [phone]  [email or web address]  She should arrive 15 minutes piror to her appointment.  I gave the time and date of the appointment to the patient's daughter and showed her on her smart phone where our website was located.  We will sign off for now please call with further questions.   LOS: 8 days   Alcide Evener 11/13/2020, 12:03 PM

## 2020-11-13 NOTE — Progress Notes (Signed)
NAME:  Tracy Buck, MRN:  [id number], DOB:  06-01-1935, LOS: 51 ADMISSION DATE:  11/05/2020, CONSULTATION DATE:  3/16 REFERRING MD:  Dr Billy Fischer EDP, CHIEF COMPLAINT:  Sepsis  Brief History:  86 year old female admitted 3/16 with concerns of sepsis with unclear source and requiring BiPAP for hypoxia while at [facility]. Transferred to [facility] on empiric antibiotics. She was felt to have sepsis secondary to duodenitis after imaging was done. Hypotensive but resolved spontaneously resolved. Dyspnea improved with BiPAP. She was ultimately transferred out to the floor. Blood cultures grew actinomyces. Bacteremia workup initiated including TEE on 3/22 without clear vegetation. Overnight 3/21 >3/22 she developed tachypnea felt to represent anxiety, which did not improved with ativan/haldol administration. PCCM asked to see on 3/22.   Past Medical History:   has a past medical history of Arthritis, Complication of anesthesia, COPD (chronic obstructive pulmonary disease) ([place]), Heart valve replaced, Hyperlipidemia, Hypertension, LBBB (left bundle branch block), Lung cancer ([place]) (2013), Lung mass, Myocardial infarct ([place]), Panic attack, Pneumonia, Rib fracture, and Skin cancer of nose.   Significant Hospital Events:  3/16 admit to ICU with working diagnosis sepsis 3/17> to floor 3/20> ID consult for Actinomyces 3/22 PCCM reconsulted for worsening hypoxia> thoracentesis with removal of 600cc on L, diuresis to address R effusion   Consults:  Cardiology ID  Procedures:  TEE 3/22 >>> severe TR. Mild to moderate MR, preserved EF L thora 3/22> mildly elevated LDH, 293 WBCs (44% PMNs)  Significant Diagnostic Tests:  CT maxillofacial 3/22 >>> no active sinus disease  Micro Data:  Blood 3/16 > Actinomyces neuii Urine 3/16 > NG 3/20 blood>> Left pleural fluid 3/22>   Antimicrobials:  Flagyl 3/16 ED Cefepime 3/16  Zosyn 3/16>3/19 unasyn 3/20-3/21 Vancomycin 3/16 >3/19 PCN G 3/22>  Interim History /  Subjective:  Today her breathing feels better. She has walked some. She is now on RA.  Objective   Blood pressure (!) 122/58, pulse 73, temperature 97.8 F (36.6 C), temperature source Oral, resp. rate 20, height 5\' 3"  (1.6 m), weight 78.2 kg, SpO2 96 %.        Intake/Output Summary (Last 24 hours) at 11/13/2020 0838 Last data filed at 11/13/2020 0300 Gross per 24 hour  Intake 1789.85 ml  Output 800 ml  Net 989.85 ml   Filed Weights   11/05/20 0707 11/05/20 1002 11/07/20 0500  Weight: 76.2 kg 78.2 kg 78.2 kg    Net I/O since admission +4L  Examination: General:  Elderly woman sitting up in bed in NAD.  Neuro: awake, alert, answering questions appropriately HEENT: [place]/AT, eyes anicteric Cardiovascular:  S1S2, irreg rhythm, reg rate Lungs:  Breathing comfortably on RA. Decreased right basilar breath sounds, faint LL rhales. Abdomen:  Soft, NT Musculoskeletal:  Persistent peripheral edema, no cyanosis. Skin:  Lots of bruising  CXR 3/23 personally reviewed> R>L effusions, ongoing pulm edema  Pleural fluid 3/22: LDH 57 Protein <3g WBC 293 (44% PMNs)  Culture remains NGTD  Resolved Hospital Problem list   AKI  Assessment & Plan:   Bacteremia- actinomyces. TEE negative for vegetations on 3/22. No evidence of sinus disease on CT. - Antibiotics per ID team - con't continuous penicillin - Needs PICC for long term therapy - pleural cultures pending; 3/20 repeat blood cx pending-- both NGTD  Acute respiratory failure with hypoxia; acute respiratory distress resolved post-thora on L and with diuresis.  Acute pulmonary edema R pleural effusion likely due to hypervolemia> persistent  Loculated left-sided weakly exudative effusion likely from previous  infection, but relatively low WBC count suggests against an active infection. This had likely enlarged due to hypervolemia - Con't diuresis; strict I/Os. Lasix TID today. Will need baseline diuretic regimen with continuous infusion  requirements. - weaned down to RA; supplemental O2 as required to maintain SpO2 >90% - OOB mobility, IS, pulmonary hygiene - follow pleural fluid cultures until finalized> NGTD  Acute metabolic encephalopathy; resolved -con't delirium precautions  Echo changes- worsened MR, severe TR Hx CABG, TAVR Afib w/ RVR, now rate controlled - management per primary; remains on apixaban  Questionable COPD diagnosis; no PFTs  - resume home LABA/LAMA/ICS - Breo & Incruse once daily.  Allergies, HTN, HLD -Per primary team.  PCCM will continue to follow intermittently. Please call in the interim with questions.    Best practice (evaluated daily)  Per primary   Julian Hy, DO 11/13/20 1:41 PM [facility]  From 7AM- 7PM if no response to pager, please call [phone]. After hours, 7PM- 7AM, please call Elink  [phone].

## 2020-11-14 DIAGNOSIS — A429 Actinomycosis, unspecified: Secondary | ICD-10-CM | POA: Diagnosis not present

## 2020-11-14 LAB — BASIC METABOLIC PANEL
Anion gap: 10 (ref 5–15)
BUN: 7 mg/dL — ABNORMAL LOW (ref 8–23)
CO2: 26 mmol/L (ref 22–32)
Calcium: 8.4 mg/dL — ABNORMAL LOW (ref 8.9–10.3)
Chloride: 92 mmol/L — ABNORMAL LOW (ref 98–111)
Creatinine, Ser: 0.95 mg/dL (ref 0.44–1.00)
GFR, Estimated: 59 mL/min — ABNORMAL LOW (ref >60)
Glucose, Bld: 144 mg/dL — ABNORMAL HIGH (ref 70–99)
Potassium: 3.4 mmol/L — ABNORMAL LOW (ref 3.5–5.1)
Sodium: 128 mmol/L — ABNORMAL LOW (ref 135–145)

## 2020-11-14 LAB — CBC
HCT: 33.9 % — ABNORMAL LOW (ref 36.0–46.0)
Hemoglobin: 11.6 g/dL — ABNORMAL LOW (ref 12.0–15.0)
MCH: 29.7 pg (ref 26.0–34.0)
MCHC: 34.2 g/dL (ref 30.0–36.0)
MCV: 86.9 fL (ref 80.0–100.0)
Platelets: 327 10*3/uL (ref 150–400)
RBC: 3.9 MIL/uL (ref 3.87–5.11)
RDW: 14.3 % (ref 11.5–15.5)
WBC: 11.8 10*3/uL — ABNORMAL HIGH (ref 4.0–10.5)
nRBC: 0 % (ref 0.0–0.2)

## 2020-11-14 LAB — CULTURE, BLOOD (ROUTINE X 2)
Culture: NO GROWTH
Culture: NO GROWTH
Special Requests: ADEQUATE

## 2020-11-14 MED ORDER — DILTIAZEM HCL ER COATED BEADS 120 MG PO CP24
120.0000 mg | ORAL_CAPSULE | Freq: Every day | ORAL | Status: DC
  Administered 2020-11-15 – 2020-11-17 (×3): 120 mg via ORAL
  Filled 2020-11-14 (×3): qty 1

## 2020-11-14 MED ORDER — FUROSEMIDE 10 MG/ML IJ SOLN
60.0000 mg | Freq: Two times a day (BID) | INTRAMUSCULAR | Status: AC
  Administered 2020-11-14 – 2020-11-15 (×3): 60 mg via INTRAVENOUS
  Filled 2020-11-14 (×3): qty 6

## 2020-11-14 MED ORDER — OXYMETAZOLINE HCL 0.05 % NA SOLN
1.0000 | Freq: Two times a day (BID) | NASAL | Status: DC | PRN
  Filled 2020-11-14: qty 30

## 2020-11-14 MED ORDER — POTASSIUM CHLORIDE 20 MEQ PO PACK
40.0000 meq | PACK | Freq: Two times a day (BID) | ORAL | Status: AC
  Administered 2020-11-14 – 2020-11-15 (×3): 40 meq via ORAL
  Filled 2020-11-14 (×3): qty 2

## 2020-11-14 MED ORDER — PENICILLIN G POTASSIUM IV (FOR PTA / DISCHARGE USE ONLY): 24.0000 10*6.[IU] | Freq: Every day | INTRAVENOUS | 0 refills | Status: AC

## 2020-11-14 MED ORDER — POTASSIUM CHLORIDE 20 MEQ PO PACK
40.0000 meq | PACK | Freq: Once | ORAL | Status: AC
  Administered 2020-11-14: 40 meq via ORAL
  Filled 2020-11-14: qty 2

## 2020-11-14 NOTE — Progress Notes (Signed)
Progress Note  Patient Name: Tracy Buck Date of Encounter: 11/14/2020  [facility] Cardiologist:  [facility] Dr. Bishop Limbo  In [place] well overall.  Not using supplemental O2.  Inpatient Medications    Scheduled Meds:  (feeding supplement) PROSource Plus  30 mL Oral BID BM   apixaban  5 mg Oral BID   aspirin EC  81 mg Oral Daily   Chlorhexidine Gluconate Cloth  6 each Topical Daily   diltiazem  30 mg Oral Q6H   feeding supplement  237 mL Oral TID BM   fluticasone furoate-vilanterol  1 puff Inhalation Daily   loratadine  10 mg Oral Daily   melatonin  3 mg Oral QHS   multivitamin with minerals  1 tablet Oral Daily   nystatin  5 mL Oral QID   rosuvastatin  20 mg Oral Daily   sodium chloride flush  10-40 mL Intracatheter Q12H   umeclidinium bromide  1 puff Inhalation Daily   Continuous Infusions:  sodium chloride Stopped (11/05/20 0847)   penicillin g continuous IV infusion 12 Million Units (11/14/20 0021)   PRN Meds: sodium chloride, acetaminophen, diphenhydrAMINE, docusate sodium, fentaNYL (SUBLIMAZE) injection, haloperidol lactate, ipratropium-albuterol, LORazepam, polyethylene glycol, sodium chloride flush   Vital Signs    Vitals:   11/14/20 0300 11/14/20 0500 11/14/20 0624 11/14/20 0745  BP: 120/74  109/73 (!) 111/91  Pulse: 93  91 77  Resp: 18  (!) 24 (!) 31  Temp: 98.7 F (37.1 C)  98.6 F (37 C) 98.3 F (36.8 C)  TempSrc: Oral  Oral Oral  SpO2: 95%   91%  Weight:  73.8 kg    Height:        Intake/Output Summary (Last 24 hours) at 11/14/2020 0826 Last data filed at 11/14/2020 0600 Gross per 24 hour  Intake 120 ml  Output 2500 ml  Net -2380 ml   Last 3 Weights 11/14/2020 11/07/2020 11/05/2020  Weight (lbs) 162 lb 11.2 oz 172 lb 6.4 oz 172 lb 6.4 oz  Weight (kg) 73.8 kg 78.2 kg 78.2 kg      Telemetry    Atrial fibrillation with HR in the  80s - Personally Reviewed  ECG    No new  Physical Exam   GEN: No  acute distress.   Neck: No JVD Cardiac: iRRR, 1/6 sem   Respiratory:  Faint bibasilar crackles GI: Soft, nontender, non-distended  MS:  Trace bilateral edema; No deformity. Neuro:  Nonfocal  Psych: Normal affect    Labs    High Sensitivity Troponin:   Recent Labs  Lab 11/05/20 0650 11/05/20 1242 11/05/20 1508  TROPONINIHS 1,716* 1,693* 1,404*      Chemistry Recent Labs  Lab 11/08/20 0159 11/09/20 0114 11/10/20 0215 11/10/20 1019 11/11/20 0101 11/13/20 0500 11/14/20 0340  NA  --  130*  --    < > 132* 132* 128*  K  --  3.7  --    < > 3.8 3.0* 3.4*  CL  --  100  --    < > 104 95* 92*  CO2  --  22  --    < > 22 30 26   GLUCOSE  --  122*  --    < > 151* 142* 144*  BUN  --  9  --    < > 7* 5* 7*  CREATININE  --  0.77  --    < > 0.75 0.88 0.95  CALCIUM  --  7.8*  --    < >  8.1* 8.2* 8.4*  PROT 5.3* 5.5* 5.4*  --   --   --   --   ALBUMIN 2.0* 1.9*   1.9* 1.9*  --   --   --   --   AST 23 21 34  --   --   --   --   ALT 35 27 34  --   --   --   --   ALKPHOS 100 93 86  --   --   --   --   BILITOT 1.5* 1.3* 1.3*  --   --   --   --   GFRNONAA  --  >60  --    < > >60 >60 59*  ANIONGAP  --  8  --    < > 6 7 10    < > = values in this interval not displayed.     Hematology Recent Labs  Lab 11/12/20 0308 11/13/20 0500 11/14/20 0340  WBC 9.0 9.8 11.8*  RBC 3.67* 3.54* 3.90  HGB 11.1* 10.7* 11.6*  HCT 33.0* 31.4* 33.9*  MCV 89.9 88.7 86.9  MCH 30.2 30.2 29.7  MCHC 33.6 34.1 34.2  RDW 14.8 14.4 14.3  PLT 250 275 327    BNP No results for input(s): BNP, PROBNP in the last 168 hours.   DDimer No results for input(s): DDIMER in the last 168 hours.   Radiology    DG Chest Port 1 View  Result Date: 11/12/2020 CLINICAL DATA:  Status post PICC line placement EXAM: PORTABLE CHEST 1 VIEW COMPARISON:  11/11/2020 FINDINGS: Cardiac shadow is stable. Changes of prior TAVR and median sternotomy are again seen and stable. New right-sided PICC line is noted with the catheter tip  at the cavoatrial junction in satisfactory position. Small bilateral pleural effusions are again identified right greater than left. No new focal abnormality is seen. IMPRESSION: Right-sided PICC line in satisfactory position as described. The remainder of the exam is stable from the previous day. Electronically Signed   By: Inez Catalina M.D.   On: 11/12/2020 12:32    Cardiac Studies   Echo 11/05/2020 1. S/p TAVR (23 mm evolout pro). MG slightly higher across prosthesis  likely due to Afib with RVR and sepsis. MR is now moderate to severe. TR  is now moderate to severe. Would have low threshold for TEE if the patient  develops bacteremia with prosthetic  AVR and change in MR/TR. No definitive vegetation seen on this TTE.  2. 23 mm evolute pro (ViV procedure for prior 21 mm Magna ease) in the  aortic position. Vmax 2.8 m/s, MG 21 mmHG, EOA 1.18 cm2, DI 0.28. MG  slightly higher than what was reported at [facility] (15 mmHG 05/30/2020),  likely due to Afib and sepsis. The AT is  slightly prolonged ~114 msec. If bacteremia is present would recommend  TEE. The aortic valve has been repaired/replaced. Aortic valve  regurgitation is not visualized. There is a 23 mm CoreValve-Evolut Pro  prosthetic (TAVR) valve present in the aortic  position. Procedure Date: 05/01/2020.  3. Left ventricular ejection fraction, by estimation, is 50 to 55%. The  left ventricle has low normal function. The left ventricle has no regional  wall motion abnormalities. There is mild concentric left ventricular  hypertrophy. Left ventricular  diastolic function could not be evaluated.  4. Right ventricular systolic function is moderately reduced. The right  ventricular size is mildly enlarged. There is moderately elevated  pulmonary artery systolic pressure. The estimated  right ventricular  systolic pressure is 34.3 mmHg.  5. Right atrial size was mildly dilated.  6. The mitral valve is degenerative. Moderate to severe  mitral valve  regurgitation. No evidence of mitral stenosis.  7. The tricuspid valve is abnormal. Tricuspid valve regurgitation is  moderate to severe.  8. The inferior vena cava is dilated in size with >50% respiratory  variability, suggesting right atrial pressure of 8 mmHg.   Patient Profile     86 y.o. 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   Assessment & Plan    Respiratory distress- PCCM involved due to acute worsening.  - lasix 60 mg IV TID yesterday, discussed with PCCM -   need strict I/o and daily weights, appears net positive. - s/p L thoracentesis, 600 cc of amber fluid - independently reviewed TEE images. Grossly stable AVR, MR is moderate, TR is at least moderate.  - volume overload contributed to by atrial fibrillation, fortunately rate controlled now.  Cr: 0.88 > 0.95 UOP yesterday: 2.7 L Weight: 78.2 >> 73.8 kg Net negative for admission:  Still net positive 1.5 L Diuretic plan: continue lasix 60 mg IV BID today Lytes: mildly worsening hyponatremia, hypokalemic. Monitor with diuresis and replete potassium.  New onset afib RVR  - HR better controlled with initiation of diltiazem 30 mg Q6hr. Despite respiratory distress yesterday, rates stable. Consolidate to Diltiazem 120 mg daily tomorrow. - continue Apixaban 5mg  BID (age 10, weight 78 kg, cr normal) (CHADS2VASC score 5).  Consider outpatient DCCV if indicated in 3-4 weeks after [facility], if rate control adequate would not pursue now given sepsis and bacteremia.  Demand ischemia - [phone] - no CP and no focal WMA on echo - Likely demand ischemia, nonobstructive CAD on cath from 8/21. - would continue ASA and eliquis in dual therapy at this time given demand ischemia if no  bleeding.  Valvular heart dz - Echo with LVEF of 50-55% - S/p TAVR (23 mm evolout pro). MG 21 mmHG, EOA 1.18 cm2, DI 0.28. MG  slightly higher than what was reported at [facility] (15 mmHG 05/30/2020),  likely due to Afib and sepsis.  Gradient was within normal range on TEE this morning with no apparent issues with prosthetic valve. -Moderate severe MR and TR by TTE, TEE shows mod MR, mod-severe TR -No vegetation seen on transesophageal echocardiogram.  Antibiotic plan is detailed in infectious diseases note.  Moderate RV dysfunction - likely driven by primary pulmonary process, complicated now by mod-severe TR. Agree with continued diuresis.  For questions or updates, please contact [place] Please consult [email or web address] for contact info under        Signed, Elouise Munroe, MD  11/14/2020, 8:26 AM

## 2020-11-14 NOTE — Telephone Encounter (Signed)
Patient still admitted to the hospital-pr

## 2020-11-14 NOTE — Plan of Care (Signed)

## 2020-11-14 NOTE — Progress Notes (Addendum)
PROGRESS NOTE    Tracy Buck  HQP:[id number] DOB: 07-Oct-1934 DOA: 11/05/2020 PCP: Drake Leach, MD   Brief Narrative:  This 86 year old female with history of COPD, hypertension, bioprosthetic aortic valve, 2021, CAD and lung cancer (carcinoid) s/p lobectomy. She was in her usual state of health until approximately 10/26/20 when she developed progressive fatigue. She has seen her PCP and cardiologist with these complaints without identifiable etiology. It was felt that perhaps polypharmacy was playing a role. Her PRN alprazolam was discontinued and Ambien dose was reduced from 10mg  to 5mg  QHS. On 3/16, early a.m.,  She has developed shortness of breath and chest tightness, presented to [facility].  EKG was felt nonischemic, had atrial fibrillation with RVR.  Patient was started on diltiazem bolus and infusion.  Patient also had low-grade fever 100 F, leukocytosis, placed on empiric antibiotic and sepsis work-up was initiated. Chest x-ray showed left-sided effusion at her former lobectomy site, creatinine 1.3, lactic acid 8.7, troponin 1700, BNP 1682. Patient then became hypotensive and was admitted to ICU.  She was admitted for severe sepsis which was presumed to be due to duodenitis and antibiotics were narrowed, now on IV Zosyn, for 7 to 10 days.  Cardiology was consulted for atrial fibrillation with RVR and abnormal echo changes (worsened MR, TAVR position slightly off). TEE : AVR appears normal.  Patient was transferred to [facility] service on 11/07/2020.  Blood cultures grew actinomyces neuii, infectious disease consulted.  Infectious disease recommended TEE to rule out infection endocarditis. Patient underwent TEE: No vegetation was found.  Infectious disease recommended Penicillin therapy for 1 month followed by oral amoxicillin to reach 6 months to a year of therapy.  Assessment & Plan:   Principal Problem:   Actinomyces infection Active Problems:   S/P AVR (aortic valve  replacement)   Acute respiratory failure with hypoxia (HCC)   Lactic acidosis   Elevated troponin   Atrial fibrillation with RVR (HCC)   Severe sepsis with septic shock (HCC)   Malnutrition of moderate degree   Acute renal failure (HCC)   Bacteremia   Fatigue   Sepsis with acute renal failure without septic shock (HCC)   S/P PICC central line placement  Acute hypoxic respiratory failure secondary to fluid overload: > Improving She was restless and tachypneic 3/22 , ABG : No PCO retention. She was given Ativan and Haldol,  continued to remain tachypneic. PCCM consulted. She was given Lasix 60 mg IV every 12 hours,  underwent thoracocentesis 0.6L removed.  Appears to be transudate Lasix 60 mg was increased to thrice daily.  She appears much improved. O2 sat 96% on room air. Continue Lasix 60 mg BID, monitor intake /output charting.  Severe sepsis presumed to be due to duodenitis, present on admission -Urine culture negative.  Blood culture positive for gram-positive rods, actinomyces neuii -CT renal stone study showed inflammatory changes surrounding the duodenum and adjacent to the head of the pancreas with extension into the region of porta hepatis, concerning for probable duodenitis. -Leukocytosis improving, afebrile -IV antibiotics narrowed to Zosyn (previously on IV vancomycin, cefepime and Flagyl), narrowed down to Unasyn.  -Advance diet to Full liquids to soft blend. -Infectious disease consulted, recommended TEE to rule out infective endocarditis.  TEE shows no vegetations. -Infectious disease recommended penicillin therapy for 1 month followed by oral amoxicillin to reach 6 months to a year of therapy. -She has had PICC Line on 3/23.  Active Problems: Atrial fibrillation with RVR, New onset -Patient was placed on  IV Cardizem drip, heart rate improved however patient developed hypotension -Cardiology consulted, heart rate remained elevated, started Cardizem 30 mg every  6hrs -Patient was placed on IV heparin, now transitioned to apixaban -Elevated troponin likely due to demand ischemia, atrial fibrillation with RVR, not ACS -2D echo with LVEF of 50 to 55% -Heart rate is controlled on cardizem.  Patient is currently on Eliquis. - Will change to Cardizem 120 mg daily before discharge.  Abnormal EKG changes, worsened MR, TR, TAVR position change -Cardiology following, MR is now moderate to severe, TR is now moderate to severe -Blood cultures grew actinomyces neuii, infectious disease recommended TEE. -Patient underwent TEE, no vegetations found.  Acute kidney injury with metabolic acidosis, lactic acidosis > Resolved. -Baseline creatinine 0.9-1.05 -Presented with creatinine of 1.3, lactic acid 8.7 on admission -Patient was placed on bicarb drip, lactic acid improved to 2.3, bicarb normal 24, creatinine 0.9 -Diet advanced to solids today - Acute kidney injury has resolved.  Metabolic acidosis has resolved.  Last CO2 was 24.  COPD -Currently stable, no wheezing, continue outpatient inhalers.  Hypertension -BP currently stable, holding antihypertensives.  Hyperlipidemia Continue statin  Generalized weakness: Patient reports progressive fatigue, dizziness, generalized debility. Patient was seen by her PCP, cardiologist, no identifiable etiology found.   Perhaps polypharmacy could be the reason.  Xanax was discontinued.   Ambien dose decreased from 10 mg to 5 mg, now discontinued -Continue melatonin -Would not resume Xanax outpatient, discussed with patient's daughter -Continue PT OT, mobilization, very deconditioned. PT : Home Health services.  Malnutrition of moderate degree Very deconditioned, diet advanced , albumin 2.0 Estimated body mass index is 30.54 kg/m as calculated from the following:   Height as of this encounter: 5\' 3"  (1.6 m).   Weight as of this encounter: 78.2 kg.   DVT prophylaxis: Eliquis Code Status: Full code. Family  Communication:  Daughter at bed side. Disposition Plan:   Status is: Inpatient  Remains inpatient appropriate because:Inpatient level of care appropriate due to severity of illness   Dispo: The patient is from: Home              Anticipated d/c is to: Home with Home services. 3/26              Patient currently is not medically stable to d/c.   Difficult to place patient No  Patient needs few days of IV diuresis, needs [place] services for IV antibiotics after discharge.   Consultants:   Cardiology  Infectious disease.  Procedures: TTE, TEE Antimicrobials:   Anti-infectives (From admission, onward)   Start     Dose/Rate Route Frequency Ordered Stop   11/11/20 1145  penicillin G potassium 12 Million Units in dextrose 5 % 500 mL continuous infusion        12 Million Units 41.7 mL/hr over 12 Hours Intravenous Every 12 hours 11/11/20 1045     11/09/20 1130  Ampicillin-Sulbactam (UNASYN) 3 g in sodium chloride 0.9 % 100 mL IVPB  Status:  Discontinued        3 g 200 mL/hr over 30 Minutes Intravenous Every 6 hours 11/09/20 1025 11/11/20 1045   11/06/20 0600  vancomycin (VANCOREADY) IVPB 750 mg/150 mL  Status:  Discontinued        750 mg 150 mL/hr over 60 Minutes Intravenous Every 24 hours 11/05/20 0757 11/06/20 0745   11/05/20 2000  ceFEPIme (MAXIPIME) 2 g in sodium chloride 0.9 % 100 mL IVPB  Status:  Discontinued  2 g 200 mL/hr over 30 Minutes Intravenous Every 12 hours 11/05/20 0757 11/05/20 1704   11/05/20 1745  piperacillin-tazobactam (ZOSYN) IVPB 3.375 g  Status:  Discontinued        3.375 g 12.5 mL/hr over 240 Minutes Intravenous Every 8 hours 11/05/20 1720 11/09/20 1015   11/05/20 0730  ceFEPIme (MAXIPIME) 2 g in sodium chloride 0.9 % 100 mL IVPB        2 g 200 mL/hr over 30 Minutes Intravenous  Once 11/05/20 0716 11/05/20 0814   11/05/20 0730  metroNIDAZOLE (FLAGYL) IVPB 500 mg        500 mg 100 mL/hr over 60 Minutes Intravenous  Once 11/05/20 0716 11/05/20  0850   11/05/20 0730  vancomycin (VANCOCIN) IVPB 1000 mg/200 mL premix        1,000 mg 200 mL/hr over 60 Minutes Intravenous  Once 11/05/20 0716 11/05/20 0914      Subjective: Patient was seen and examined at bedside.  Overnight events noted. Patient reports feeling improved, sitting comfortably on bed. She responds appropriately, alert and oriented x 3. She is calm, comfortable , her O2 sat 96% on Room air.  Objective: Vitals:   11/14/20 0745 11/14/20 0800 11/14/20 0841 11/14/20 1116  BP: (!) 111/91 (!) 106/57  125/77  Pulse: 77 73 89 80  Resp: (!) 31 20 14 20   Temp: 98.3 F (36.8 C)   98.3 F (36.8 C)  TempSrc: Oral   Oral  SpO2: 91%  96% 97%  Weight:      Height:        Intake/Output Summary (Last 24 hours) at 11/14/2020 1209 Last data filed at 11/14/2020 0600 Gross per 24 hour  Intake 120 ml  Output 2500 ml  Net -2380 ml   Filed Weights   11/05/20 1002 11/07/20 0500 11/14/20 0500  Weight: 78.2 kg 78.2 kg 73.8 kg    Examination:  General exam: Appears calm and comfortable. Not in any acute distress. Respiratory system: Clear to auscultation. Respiratory effort normal. Cardiovascular system: S1 & S 2 heard, RRR. No JVD, murmurs, rubs, gallops or clicks. No pedal edema. Gastrointestinal system: Abdomen is nondistended, soft and nontender.  No organomegaly or masses felt. Normal bowel sounds heard. Central nervous system: Alert and oriented. No focal neurological deficits. Extremities: Symmetric 5 x 5 power.  No edema, no cyanosis, no clubbing. Skin: No rashes, lesions or ulcers Psychiatry: Judgement and insight appear normal. Mood & affect appropriate.     Data Reviewed: I have personally reviewed following labs and imaging studies  CBC: Recent Labs  Lab 11/10/20 0215 11/11/20 0101 11/12/20 0308 11/13/20 0500 11/14/20 0340  WBC 11.2* 13.5* 9.0 9.8 11.8*  HGB 10.7* 11.6* 11.1* 10.7* 11.6*  HCT 32.6* 35.7* 33.0* 31.4* 33.9*  MCV 89.6 91.1 89.9 88.7 86.9   PLT 185 209 250 275 993   Basic Metabolic Panel: Recent Labs  Lab 11/09/20 0114 11/10/20 1019 11/11/20 0101 11/13/20 0500 11/14/20 0340  NA 130* 129* 132* 132* 128*  K 3.7 3.6 3.8 3.0* 3.4*  CL 100 102 104 95* 92*  CO2 22 21* 22 30 26   GLUCOSE 122* 231* 151* 142* 144*  BUN 9 6* 7* 5* 7*  CREATININE 0.77 0.82 0.75 0.88 0.95  CALCIUM 7.8* 7.7* 8.1* 8.2* 8.4*  MG 1.9  --  2.0  --   --   PHOS 3.3  --  3.6  --   --    GFR: Estimated Creatinine Clearance: 43.2 mL/min (by C-G formula  based on SCr of 0.95 mg/dL). Liver Function Tests: Recent Labs  Lab 11/08/20 0159 11/09/20 0114 11/10/20 0215  AST 23 21 34  ALT 35 27 34  ALKPHOS 100 93 86  BILITOT 1.5* 1.3* 1.3*  PROT 5.3* 5.5* 5.4*  ALBUMIN 2.0* 1.9*  1.9* 1.9*   No results for input(s): LIPASE, AMYLASE in the last 168 hours. No results for input(s): AMMONIA in the last 168 hours. Coagulation Profile: No results for input(s): INR, PROTIME in the last 168 hours. Cardiac Enzymes: No results for input(s): CKTOTAL, CKMB, CKMBINDEX, TROPONINI in the last 168 hours. BNP (last 3 results) No results for input(s): PROBNP in the last 8760 hours. HbA1C: No results for input(s): HGBA1C in the last 72 hours. CBG: No results for input(s): GLUCAP in the last 168 hours. Lipid Profile: No results for input(s): CHOL, HDL, LDLCALC, TRIG, CHOLHDL, LDLDIRECT in the last 72 hours. Thyroid Function Tests: No results for input(s): TSH, T4TOTAL, FREET4, T3FREE, THYROIDAB in the last 72 hours. Anemia Panel: No results for input(s): VITAMINB12, FOLATE, FERRITIN, TIBC, IRON, RETICCTPCT in the last 72 hours. Sepsis Labs: No results for input(s): PROCALCITON, LATICACIDVEN in the last 168 hours.  Recent Results (from the past 240 hour(s))  Resp Panel by RT-PCR (Flu A&B, Covid) Nasopharyngeal Swab     Status: None   Collection Time: 11/05/20  7:19 AM   Specimen: Nasopharyngeal Swab; Nasopharyngeal(NP) swabs in vial transport medium  Result  Value Ref Range Status   SARS Coronavirus 2 by RT PCR NEGATIVE NEGATIVE Final    Comment: (NOTE) SARS-CoV-2 target nucleic acids are NOT DETECTED.  The SARS-CoV-2 RNA is generally detectable in upper respiratory specimens during the acute phase of infection. The lowest concentration of SARS-CoV-2 viral copies this assay can detect is 138 copies/mL. A negative result does not preclude SARS-Cov-2 infection and should not be used as the sole basis for treatment or other patient management decisions. A negative result may occur with  improper specimen collection/handling, submission of specimen other than nasopharyngeal swab, presence of viral mutation(s) within the areas targeted by this assay, and inadequate number of viral copies(<138 copies/mL). A negative result must be combined with clinical observations, patient history, and epidemiological information. The expected result is Negative.  Fact Sheet for Patients:  EntrepreneurPulse.com.au  Fact Sheet for Healthcare Providers:  IncredibleEmployment.be  This test is no t yet approved or cleared by the [place] FDA and  has been authorized for detection and/or diagnosis of SARS-CoV-2 by FDA under an Emergency Use Authorization (EUA). This EUA will remain  in effect (meaning this test can be used) for the duration of the COVID-19 declaration under Section 564(b)(1) of the Act, 21 U.S.C.section 360bbb-3(b)(1), unless the authorization is terminated  or revoked sooner.       Influenza A by PCR NEGATIVE NEGATIVE Final   Influenza B by PCR NEGATIVE NEGATIVE Final    Comment: (NOTE) The Xpert Xpress SARS-CoV-2/FLU/RSV plus assay is intended as an aid in the diagnosis of influenza from Nasopharyngeal swab specimens and should not be used as a sole basis for treatment. Nasal washings and aspirates are unacceptable for Xpert Xpress SARS-CoV-2/FLU/RSV testing.  Fact Sheet for  Patients: EntrepreneurPulse.com.au  Fact Sheet for Healthcare Providers: IncredibleEmployment.be  This test is not yet approved or cleared by the [place] FDA and has been authorized for detection and/or diagnosis of SARS-CoV-2 by FDA under an Emergency Use Authorization (EUA). This EUA will remain in effect (meaning this test can be used) for the  duration of the COVID-19 declaration under Section 564(b)(1) of the Act, 21 U.S.C. section 360bbb-3(b)(1), unless the authorization is terminated or revoked.  Performed at [facility], [address] [place], [place] [address]   Urine culture     Status: None   Collection Time: 11/05/20  7:26 AM   Specimen: In/Out Cath Urine  Result Value Ref Range Status   Specimen Description   Final    IN/OUT CATH URINE Performed at [facility], [address] [place], [place] [address]    Special Requests   Final    NONE Performed at [facility], [address] [place], [place] [address]    Culture   Final    NO GROWTH Performed at [facility], [place] [address] [place], [place] [address]    Report Status 11/06/2020 FINAL  Final  Blood Culture (routine x 2)     Status: Abnormal   Collection Time: 11/05/20  7:27 AM   Specimen: BLOOD RIGHT HAND  Result Value Ref Range Status   Specimen Description   Final    BLOOD RIGHT HAND Performed at [facility], [address] [place], [place] [address]    Special Requests   Final    BOTTLES DRAWN AEROBIC AND ANAEROBIC Blood Culture adequate volume Performed at [facility], [address] [place], [place] [address]    Culture  Setup Time   Final    GRAM POSITIVE RODS IN BOTH AEROBIC AND ANAEROBIC BOTTLES CRITICAL RESULT CALLED TO, READ BACK BY AND VERIFIED WITH: PHARMD HALEY B. 1936 [id number] FCP    Culture (A)  Final    ACTINOMYCES NEUII Standardized susceptibility testing for this organism  is not available. Performed at [facility], [place] [address] [place], [place] [address]    Report Status 11/09/2020 FINAL  Final  MRSA PCR Screening     Status: None   Collection Time: 11/05/20 11:19 AM   Specimen: Nasal Mucosa; Nasopharyngeal  Result Value Ref Range Status   MRSA by PCR NEGATIVE NEGATIVE Final    Comment:        The GeneXpert MRSA Assay (FDA approved for NASAL specimens only), is one component of a comprehensive MRSA colonization surveillance program. It is not intended to diagnose MRSA infection nor to guide or monitor treatment for MRSA infections. Performed at [facility], [place] [address] [place], [place] [address]   Culture, blood (routine x 2)     Status: None   Collection Time: 11/09/20 10:16 AM   Specimen: BLOOD LEFT HAND  Result Value Ref Range Status   Specimen Description BLOOD LEFT HAND  Final   Special Requests   Final    BOTTLES DRAWN AEROBIC ONLY Blood Culture results may not be optimal due to an inadequate volume of blood received in culture bottles   Culture   Final    NO GROWTH 5 DAYS Performed at [facility], [place] [address] [place], [place] [address]    Report Status 11/14/2020 FINAL  Final  Culture, blood (routine x 2)     Status: None   Collection Time: 11/09/20 10:24 AM   Specimen: BLOOD RIGHT HAND  Result Value Ref Range Status   Specimen Description BLOOD RIGHT HAND  Final   Special Requests   Final    BOTTLES DRAWN AEROBIC ONLY Blood Culture adequate volume   Culture   Final  NO GROWTH 5 DAYS Performed at [facility], [place] [address] [place], [place] [address]    Report Status 11/14/2020 FINAL  Final  Body fluid culture w Gram Stain     Status: None (Preliminary result)   Collection Time: 11/11/20  3:49 PM   Specimen: Fluid  Result Value Ref Range Status   Specimen Description FLUID LEFT PLEURAL  Final   Special Requests NONE  Final   Gram Stain   Final    RARE WBC PRESENT, PREDOMINANTLY  MONONUCLEAR NO ORGANISMS SEEN    Culture   Final    NO GROWTH 3 DAYS Performed at [facility], [place] [address], [place], [place] [address]    Report Status PENDING  Incomplete    Radiology Studies: DG Chest Port 1 View  Result Date: 11/12/2020 CLINICAL DATA:  Status post PICC line placement EXAM: PORTABLE CHEST 1 VIEW COMPARISON:  11/11/2020 FINDINGS: Cardiac shadow is stable. Changes of prior TAVR and median sternotomy are again seen and stable. New right-sided PICC line is noted with the catheter tip at the cavoatrial junction in satisfactory position. Small bilateral pleural effusions are again identified right greater than left. No new focal abnormality is seen. IMPRESSION: Right-sided PICC line in satisfactory position as described. The remainder of the exam is stable from the previous day. Electronically Signed   By: Inez Catalina M.D.   On: 11/12/2020 12:32   Scheduled Meds: . (feeding supplement) PROSource Plus  30 mL Oral BID BM  . apixaban  5 mg Oral BID  . aspirin EC  81 mg Oral Daily  . Chlorhexidine Gluconate Cloth  6 each Topical Daily  . diltiazem  30 mg Oral Q6H  . feeding supplement  237 mL Oral TID BM  . fluticasone furoate-vilanterol  1 puff Inhalation Daily  . furosemide  60 mg Intravenous BID  . loratadine  10 mg Oral Daily  . melatonin  3 mg Oral QHS  . multivitamin with minerals  1 tablet Oral Daily  . nystatin  5 mL Oral QID  . potassium chloride  40 mEq Oral BID  . rosuvastatin  20 mg Oral Daily  . sodium chloride flush  10-40 mL Intracatheter Q12H  . umeclidinium bromide  1 puff Inhalation Daily   Continuous Infusions: . sodium chloride Stopped (11/05/20 0847)  . penicillin g continuous IV infusion Stopped (11/14/20 0912)     LOS: 9 days    Time spent: 25 mins    Otto Felkins, MD [facility]   If 7PM-7AM, please contact night-coverage

## 2020-11-15 DIAGNOSIS — I4819 Other persistent atrial fibrillation: Secondary | ICD-10-CM | POA: Diagnosis not present

## 2020-11-15 DIAGNOSIS — A429 Actinomycosis, unspecified: Secondary | ICD-10-CM | POA: Diagnosis not present

## 2020-11-15 LAB — BASIC METABOLIC PANEL
Anion gap: 7 (ref 5–15)
BUN: 9 mg/dL (ref 8–23)
CO2: 26 mmol/L (ref 22–32)
Calcium: 8.4 mg/dL — ABNORMAL LOW (ref 8.9–10.3)
Chloride: 96 mmol/L — ABNORMAL LOW (ref 98–111)
Creatinine, Ser: 0.94 mg/dL (ref 0.44–1.00)
GFR, Estimated: >60 mL/min (ref >60)
Glucose, Bld: 139 mg/dL — ABNORMAL HIGH (ref 70–99)
Potassium: 4.2 mmol/L (ref 3.5–5.1)
Sodium: 129 mmol/L — ABNORMAL LOW (ref 135–145)

## 2020-11-15 LAB — CBC
HCT: 33.3 % — ABNORMAL LOW (ref 36.0–46.0)
Hemoglobin: 11.1 g/dL — ABNORMAL LOW (ref 12.0–15.0)
MCH: 29.4 pg (ref 26.0–34.0)
MCHC: 33.3 g/dL (ref 30.0–36.0)
MCV: 88.1 fL (ref 80.0–100.0)
Platelets: 336 10*3/uL (ref 150–400)
RBC: 3.78 MIL/uL — ABNORMAL LOW (ref 3.87–5.11)
RDW: 14.1 % (ref 11.5–15.5)
WBC: 13 10*3/uL — ABNORMAL HIGH (ref 4.0–10.5)
nRBC: 0 % (ref 0.0–0.2)

## 2020-11-15 LAB — BODY FLUID CULTURE W GRAM STAIN: Culture: NO GROWTH

## 2020-11-15 LAB — MAGNESIUM: Magnesium: 1.9 mg/dL (ref 1.7–2.4)

## 2020-11-15 LAB — PHOSPHORUS: Phosphorus: 3.2 mg/dL (ref 2.5–4.6)

## 2020-11-15 MED ORDER — FUROSEMIDE 10 MG/ML IJ SOLN
40.0000 mg | Freq: Two times a day (BID) | INTRAMUSCULAR | Status: DC
  Administered 2020-11-15 – 2020-11-16 (×2): 40 mg via INTRAVENOUS
  Filled 2020-11-15 (×2): qty 4

## 2020-11-15 NOTE — Progress Notes (Signed)
Progress Note  Patient Name: Tracy Buck Date of Encounter: 11/15/2020  [facility] Cardiologist: [facility] Dr. [facility] [place] [facility] [place]  Subjective   No CP or dyspnea  Inpatient Medications    Scheduled Meds: . (feeding supplement) PROSource Plus  30 mL Oral BID BM  . apixaban  5 mg Oral BID  . aspirin EC  81 mg Oral Daily  . Chlorhexidine Gluconate Cloth  6 each Topical Daily  . diltiazem  120 mg Oral Daily  . feeding supplement  237 mL Oral TID BM  . fluticasone furoate-vilanterol  1 puff Inhalation Daily  . loratadine  10 mg Oral Daily  . melatonin  3 mg Oral QHS  . multivitamin with minerals  1 tablet Oral Daily  . nystatin  5 mL Oral QID  . rosuvastatin  20 mg Oral Daily  . sodium chloride flush  10-40 mL Intracatheter Q12H  . umeclidinium bromide  1 puff Inhalation Daily   Continuous Infusions: . sodium chloride Stopped (11/05/20 0847)  . penicillin g continuous IV infusion 12 Million Units (11/15/20 0007)   PRN Meds: sodium chloride, acetaminophen, diphenhydrAMINE, docusate sodium, fentaNYL (SUBLIMAZE) injection, haloperidol lactate, ipratropium-albuterol, LORazepam, oxymetazoline, polyethylene glycol, sodium chloride flush   Vital Signs    Vitals:   11/15/20 0440 11/15/20 0800 11/15/20 0852 11/15/20 1140  BP: (!) 113/55 110/71  126/83  Pulse: 84 93  90  Resp: 18 19  (!) 21  Temp: 98 F (36.7 C) 98.1 F (36.7 C)  98 F (36.7 C)  TempSrc: Oral Oral  Oral  SpO2: 92% 96% 94% 96%  Weight:      Height:        Intake/Output Summary (Last 24 hours) at 11/15/2020 1151 Last data filed at 11/14/2020 2258 Gross per 24 hour  Intake 0 ml  Output 875 ml  Net -875 ml   Last 3 Weights 11/14/2020 11/07/2020 11/05/2020  Weight (lbs) 162 lb 11.2 oz 172 lb 6.4 oz 172 lb 6.4 oz  Weight (kg) 73.8 kg 78.2 kg 78.2 kg      Telemetry    Atrial fibrillation, rate controlled- Personally Reviewed   Physical Exam   GEN: No acute distress.   Neck: No JVD Cardiac:  irregular Respiratory: mildly diminished BS bases GI: Soft, nontender, non-distended  MS: trace edema Neuro:  Nonfocal  Psych: Normal affect   Labs    High Sensitivity Troponin:   Recent Labs  Lab 11/05/20 0650 11/05/20 1242 11/05/20 1508  TROPONINIHS 1,716* 1,693* 1,404*      Chemistry Recent Labs  Lab 11/09/20 0114 11/10/20 0215 11/10/20 1019 11/13/20 0500 11/14/20 0340 11/15/20 0655  NA 130*  --    < > 132* 128* 129*  K 3.7  --    < > 3.0* 3.4* 4.2  CL 100  --    < > 95* 92* 96*  CO2 22  --    < > 30 26 26   GLUCOSE 122*  --    < > 142* 144* 139*  BUN 9  --    < > 5* 7* 9  CREATININE 0.77  --    < > 0.88 0.95 0.94  CALCIUM 7.8*  --    < > 8.2* 8.4* 8.4*  PROT 5.5* 5.4*  --   --   --   --   ALBUMIN 1.9*  1.9* 1.9*  --   --   --   --   AST 21 34  --   --   --   --  ALT 27 34  --   --   --   --   ALKPHOS 93 86  --   --   --   --   BILITOT 1.3* 1.3*  --   --   --   --   GFRNONAA >60  --    < > >60 59* >60  ANIONGAP 8  --    < > 7 10 7    < > = values in this interval not displayed.     Hematology Recent Labs  Lab 11/13/20 0500 11/14/20 0340 11/15/20 0655  WBC 9.8 11.8* 13.0*  RBC 3.54* 3.90 3.78*  HGB 10.7* 11.6* 11.1*  HCT 31.4* 33.9* 33.3*  MCV 88.7 86.9 88.1  MCH 30.2 29.7 29.4  MCHC 34.1 34.2 33.3  RDW 14.4 14.3 14.1  PLT 275 327 336     Patient Profile     86 y.o. female with a hx of AS and hx of then bioprosthetic AV stenosis, TAVR 05/01/20, chronic lung disease, lt lower lobe lobectomy due to malignant carcinoid, CAD non obstructive on cardiac cath 03/2020, HTN, HLDseen for afib in setting of severe sepsis 2nd to duodenitis.  TEE March 2022 showed normal LV function, mild to moderate mitral regurgitation, severe tricuspid regurgitation, previous aortic valve replacement with no aortic stenosis and mean gradient 8 mmHg.  Assessment & Plan    1 Persistent atrial fibrillation-patient's heart rate is controlled.  Continue Cardizem at present dose  (now changed to Cardizem CD 120 mg daily).  Continue apixaban.  Can consider outpatient cardioversion when she has been therapeutically anticoagulated for 3 consecutive weeks.  2 acute diastolic congestive heart failure-volume status has improved.  Change Lasix to 40 mg IV twice daily.  Can likely change to oral Lasix in the next 24 to 48 hours.  Follow renal function closely.  3 mildly elevated troponin-felt to be secondary to demand ischemia.  We will plan to discontinue aspirin given need for apixaban.  4 valvular heart disease with previous TAVR, mitral regurgitation and tricuspid regurgitation-no vegetation seen on recent transesophageal echocardiogram.  Will need follow-up echoes as an outpatient.  5 recent sepsis-felt secondary to duodenitis-Per primary care.  For questions or updates, please contact [place] Please consult [email or web address] for contact info under        Signed, Kirk Ruths, MD  11/15/2020, 11:51 AM

## 2020-11-15 NOTE — Progress Notes (Signed)
PROGRESS NOTE    Tracy Buck  PJK:[id number] DOB: 1934/10/21 DOA: 11/05/2020 PCP: Drake Leach, MD   Brief Narrative:  This 86 year old female with history of COPD, hypertension, bioprosthetic aortic valve, 2021, CAD and lung cancer (carcinoid) s/p lobectomy. She was in her usual state of health until approximately 10/26/20 when she developed progressive fatigue. She has seen her PCP and cardiologist with these complaints without identifiable etiology. It was felt that perhaps polypharmacy was playing a role. Her PRN alprazolam was discontinued and Ambien dose was reduced from 10mg  to 5mg  QHS. On 3/16, early a.m.,  She has developed shortness of breath and chest tightness, presented to [place].  EKG was felt nonischemic, had atrial fibrillation with RVR.  Patient was started on diltiazem bolus and infusion.  Patient also had low-grade fever 100 F, leukocytosis, placed on empiric antibiotic and sepsis work-up was initiated. Chest x-ray showed left-sided effusion at her former lobectomy site, creatinine 1.3, lactic acid 8.7, troponin 1700, BNP 1682. Patient then became hypotensive and was admitted to ICU.  She was admitted for severe sepsis which was presumed to be due to duodenitis and antibiotics were narrowed, now on IV Zosyn, for 7 to 10 days.  Cardiology was consulted for atrial fibrillation with RVR and abnormal echo changes (worsened MR, TAVR position slightly off). TEE : AVR appears normal.  Patient was transferred to [facility] service on 11/07/2020.  Blood cultures grew actinomyces neuii, infectious disease consulted.  Infectious disease recommended TEE to rule out infection endocarditis. Patient underwent TEE: No vegetation was found.  Infectious disease recommended Penicillin therapy for 1 month followed by oral amoxicillin to reach 6 months to a year of therapy.  Assessment & Plan:   Principal Problem:   Actinomyces infection Active Problems:   S/P AVR (aortic valve  replacement)   Acute respiratory failure with hypoxia (HCC)   Lactic acidosis   Elevated troponin   Atrial fibrillation with RVR (HCC)   Severe sepsis with septic shock (HCC)   Malnutrition of moderate degree   Acute renal failure (HCC)   Bacteremia   Fatigue   Sepsis with acute renal failure without septic shock (HCC)   S/P PICC central line placement  Acute hypoxic respiratory failure secondary to fluid overload: > Improving She was restless and tachypneic 3/22 , ABG : No PCO retention. She was given Ativan and Haldol,  continued to remain tachypneic. PCCM consulted. She was given Lasix 60 mg IV every 12 hours,  underwent thoracocentesis 0.6L removed.  Appears to be transudate Lasix 60 mg was increased to thrice daily.  She appears much improved. O2 sat 96% on room air. Continue Lasix 60 mg IV BID, monitor intake /output charting. She seems much improved,  sitting comfortably on the chair with no oxygen.   Severe sepsis presumed to be due to duodenitis, present on admission -Urine culture negative.  Blood culture positive for gram-positive rods, actinomyces neuii -CT renal stone study showed inflammatory changes surrounding the duodenum and adjacent to the head of the pancreas with extension into the region of porta hepatis, concerning for probable duodenitis. -Leukocytosis improving, afebrile -IV antibiotics narrowed to Zosyn (previously on IV vancomycin, cefepime and Flagyl), narrowed down to Unasyn.  -Advance diet to Full liquids to soft blend. -Infectious disease consulted, recommended TEE to rule out infective endocarditis.  TEE shows no vegetations. -Infectious disease recommended penicillin therapy for 1 month followed by oral amoxicillin to reach 6 months to a year of therapy. -She has had PICC Line  on 3/23.  Active Problems: Atrial fibrillation with RVR, New onset -Patient was placed on IV Cardizem drip, heart rate improved however patient developed  hypotension -Cardiology consulted, heart rate remained elevated, started Cardizem 30 mg every 6hrs -Patient was placed on IV heparin, now transitioned to apixaban -Elevated troponin likely due to demand ischemia, atrial fibrillation with RVR, not ACS -2D echo with LVEF of 50 to 55% -Heart rate is controlled on cardizem.  Patient is currently on Eliquis. - Will change to Cardizem 120 mg daily  Abnormal EKG changes, worsened MR, TR, TAVR position change -Cardiology following, MR is now moderate to severe, TR is now moderate to severe -Blood cultures grew actinomyces neuii, infectious disease recommended TEE. -Patient underwent TEE, no vegetations found.  Acute kidney injury with metabolic acidosis, lactic acidosis > Resolved. -Baseline creatinine 0.9-1.05 -Presented with creatinine of 1.3, lactic acid 8.7 on admission -Patient was placed on bicarb drip, lactic acid improved to 2.3, bicarb normal 24, creatinine 0.9 -Diet advanced to solids today - Acute kidney injury has resolved.  Metabolic acidosis has resolved.  Last CO2 was 24.  COPD -Currently stable, no wheezing, continue outpatient inhalers.  Hypertension -BP currently stable, holding antihypertensives.  Hyperlipidemia Continue statin  Generalized weakness: Patient reports progressive fatigue, dizziness, generalized debility. Patient was seen by her PCP, cardiologist, no identifiable etiology found.   Perhaps polypharmacy could be the reason.  Xanax was discontinued.   Ambien dose decreased from 10 mg to 5 mg, now discontinued -Continue melatonin -Continue PT OT, mobilization, very deconditioned. PT : Home Health services.  Malnutrition of moderate degree Very deconditioned, diet advanced , albumin 2.0 Estimated body mass index is 30.54 kg/m as calculated from the following:   Height as of this encounter: 5\' 3"  (1.6 m).   Weight as of this encounter: 78.2 kg.   DVT prophylaxis: Eliquis Code Status: Full  code. Family Communication:  Daughter at bed side. Disposition Plan:   Status is: Inpatient  Remains inpatient appropriate because:Inpatient level of care appropriate due to severity of illness   Dispo: The patient is from: Home              Anticipated d/c is to: Home with Home services.               Patient currently is not medically stable to d/c.   Difficult to place patient No  Patient needs few days of IV diuresis, needs [place] services for IV antibiotics after discharge.   Consultants:   Cardiology  Infectious disease.  Procedures: TTE, TEE Antimicrobials:   Anti-infectives (From admission, onward)   Start     Dose/Rate Route Frequency Ordered Stop   11/14/20 0000  penicillin G IVPB        24 Million Units Intravenous Daily 11/14/20 1608 12/09/20 2359   11/11/20 1145  penicillin G potassium 12 Million Units in dextrose 5 % 500 mL continuous infusion        12 Million Units 41.7 mL/hr over 12 Hours Intravenous Every 12 hours 11/11/20 1045     11/09/20 1130  Ampicillin-Sulbactam (UNASYN) 3 g in sodium chloride 0.9 % 100 mL IVPB  Status:  Discontinued        3 g 200 mL/hr over 30 Minutes Intravenous Every 6 hours 11/09/20 1025 11/11/20 1045   11/06/20 0600  vancomycin (VANCOREADY) IVPB 750 mg/150 mL  Status:  Discontinued        750 mg 150 mL/hr over 60 Minutes Intravenous Every 24 hours 11/05/20  0277 11/06/20 0745   11/05/20 2000  ceFEPIme (MAXIPIME) 2 g in sodium chloride 0.9 % 100 mL IVPB  Status:  Discontinued        2 g 200 mL/hr over 30 Minutes Intravenous Every 12 hours 11/05/20 0757 11/05/20 1704   11/05/20 1745  piperacillin-tazobactam (ZOSYN) IVPB 3.375 g  Status:  Discontinued        3.375 g 12.5 mL/hr over 240 Minutes Intravenous Every 8 hours 11/05/20 1720 11/09/20 1015   11/05/20 0730  ceFEPIme (MAXIPIME) 2 g in sodium chloride 0.9 % 100 mL IVPB        2 g 200 mL/hr over 30 Minutes Intravenous  Once 11/05/20 0716 11/05/20 0814   11/05/20 0730   metroNIDAZOLE (FLAGYL) IVPB 500 mg        500 mg 100 mL/hr over 60 Minutes Intravenous  Once 11/05/20 0716 11/05/20 0850   11/05/20 0730  vancomycin (VANCOCIN) IVPB 1000 mg/200 mL premix        1,000 mg 200 mL/hr over 60 Minutes Intravenous  Once 11/05/20 0716 11/05/20 0914      Subjective: Patient was seen and examined at bedside.  Overnight events noted. Patient reports feeling much improved, sitting comfortably on bed.   She responds appropriately, alert and oriented x 3. She is calm, comfortable , her O2 sat 96% on Room air.  Objective: Vitals:   11/15/20 0400 11/15/20 0440 11/15/20 0800 11/15/20 0852  BP: (!) 95/57 (!) 113/55 110/71   Pulse: 75 84 93   Resp: (!) 29 18 19    Temp:  98 F (36.7 C) 98.1 F (36.7 C)   TempSrc:  Oral Oral   SpO2: 95% 92% 96% 94%  Weight:      Height:        Intake/Output Summary (Last 24 hours) at 11/15/2020 1138 Last data filed at 11/14/2020 2258 Gross per 24 hour  Intake 0 ml  Output 875 ml  Net -875 ml   Filed Weights   11/05/20 1002 11/07/20 0500 11/14/20 0500  Weight: 78.2 kg 78.2 kg 73.8 kg    Examination:  General exam: Appears calm and comfortable. Not in any acute distress. Respiratory system: Clear to auscultation. Respiratory effort normal. Cardiovascular system: S1 & S 2 heard, RRR. No JVD, murmurs, rubs, gallops or clicks. No pedal edema. Gastrointestinal system: Abdomen is nondistended, soft and nontender.  No organomegaly or masses felt. Normal bowel sounds heard. Central nervous system: Alert and oriented. No focal neurological deficits. Extremities: Symmetric 5 x 5 power.  No edema, no cyanosis, no clubbing. Skin: No rashes, lesions or ulcers Psychiatry: Judgement and insight appear normal. Mood & affect appropriate.     Data Reviewed: I have personally reviewed following labs and imaging studies  CBC: Recent Labs  Lab 11/11/20 0101 11/12/20 0308 11/13/20 0500 11/14/20 0340 11/15/20 0655  WBC 13.5* 9.0  9.8 11.8* 13.0*  HGB 11.6* 11.1* 10.7* 11.6* 11.1*  HCT 35.7* 33.0* 31.4* 33.9* 33.3*  MCV 91.1 89.9 88.7 86.9 88.1  PLT 209 250 275 327 412   Basic Metabolic Panel: Recent Labs  Lab 11/09/20 0114 11/10/20 1019 11/11/20 0101 11/13/20 0500 11/14/20 0340 11/15/20 0655  NA 130* 129* 132* 132* 128* 129*  K 3.7 3.6 3.8 3.0* 3.4* 4.2  CL 100 102 104 95* 92* 96*  CO2 22 21* 22 30 26 26   GLUCOSE 122* 231* 151* 142* 144* 139*  BUN 9 6* 7* 5* 7* 9  CREATININE 0.77 0.82 0.75 0.88 0.95 0.94  CALCIUM 7.8* 7.7* 8.1* 8.2* 8.4* 8.4*  MG 1.9  --  2.0  --   --  1.9  PHOS 3.3  --  3.6  --   --  3.2   GFR: Estimated Creatinine Clearance: 43.7 mL/min (by C-G formula based on SCr of 0.94 mg/dL). Liver Function Tests: Recent Labs  Lab 11/09/20 0114 11/10/20 0215  AST 21 34  ALT 27 34  ALKPHOS 93 86  BILITOT 1.3* 1.3*  PROT 5.5* 5.4*  ALBUMIN 1.9*  1.9* 1.9*   No results for input(s): LIPASE, AMYLASE in the last 168 hours. No results for input(s): AMMONIA in the last 168 hours. Coagulation Profile: No results for input(s): INR, PROTIME in the last 168 hours. Cardiac Enzymes: No results for input(s): CKTOTAL, CKMB, CKMBINDEX, TROPONINI in the last 168 hours. BNP (last 3 results) No results for input(s): PROBNP in the last 8760 hours. HbA1C: No results for input(s): HGBA1C in the last 72 hours. CBG: No results for input(s): GLUCAP in the last 168 hours. Lipid Profile: No results for input(s): CHOL, HDL, LDLCALC, TRIG, CHOLHDL, LDLDIRECT in the last 72 hours. Thyroid Function Tests: No results for input(s): TSH, T4TOTAL, FREET4, T3FREE, THYROIDAB in the last 72 hours. Anemia Panel: No results for input(s): VITAMINB12, FOLATE, FERRITIN, TIBC, IRON, RETICCTPCT in the last 72 hours. Sepsis Labs: No results for input(s): PROCALCITON, LATICACIDVEN in the last 168 hours.  Recent Results (from the past 240 hour(s))  Culture, blood (routine x 2)     Status: None   Collection Time:  11/09/20 10:16 AM   Specimen: BLOOD LEFT HAND  Result Value Ref Range Status   Specimen Description BLOOD LEFT HAND  Final   Special Requests   Final    BOTTLES DRAWN AEROBIC ONLY Blood Culture results may not be optimal due to an inadequate volume of blood received in culture bottles   Culture   Final    NO GROWTH 5 DAYS Performed at [facility], [place] [address] [place], [place] [address]    Report Status 11/14/2020 FINAL  Final  Culture, blood (routine x 2)     Status: None   Collection Time: 11/09/20 10:24 AM   Specimen: BLOOD RIGHT HAND  Result Value Ref Range Status   Specimen Description BLOOD RIGHT HAND  Final   Special Requests   Final    BOTTLES DRAWN AEROBIC ONLY Blood Culture adequate volume   Culture   Final    NO GROWTH 5 DAYS Performed at [facility], [place] [address], [place], [place] [address]    Report Status 11/14/2020 FINAL  Final  Body fluid culture w Gram Stain     Status: None   Collection Time: 11/11/20  3:49 PM   Specimen: Fluid  Result Value Ref Range Status   Specimen Description FLUID LEFT PLEURAL  Final   Special Requests NONE  Final   Gram Stain   Final    RARE WBC PRESENT, PREDOMINANTLY MONONUCLEAR NO ORGANISMS SEEN    Culture   Final    NO GROWTH Performed at [facility], [place] [address], [place], [place] [address]    Report Status 11/15/2020 FINAL  Final    Radiology Studies: No results found. Scheduled Meds: . (feeding supplement) PROSource Plus  30 mL Oral BID BM  . apixaban  5 mg Oral BID  . aspirin EC  81 mg Oral Daily  . Chlorhexidine Gluconate Cloth  6 each Topical Daily  . diltiazem  120 mg Oral Daily  . feeding supplement  237 mL Oral TID BM  . fluticasone furoate-vilanterol  1 puff Inhalation Daily  . loratadine  10 mg Oral Daily  . melatonin  3 mg Oral QHS  . multivitamin with minerals  1 tablet Oral Daily  . nystatin  5 mL Oral QID  . rosuvastatin  20 mg Oral Daily  . sodium chloride flush  10-40 mL  Intracatheter Q12H  . umeclidinium bromide  1 puff Inhalation Daily   Continuous Infusions: . sodium chloride Stopped (11/05/20 0847)  . penicillin g continuous IV infusion 12 Million Units (11/15/20 0007)     LOS: 10 days    Time spent: 25 mins    PARDEEP KUMAR, MD [facility]   If 7PM-7AM, please contact night-coverage

## 2020-11-16 DIAGNOSIS — A429 Actinomycosis, unspecified: Secondary | ICD-10-CM | POA: Diagnosis not present

## 2020-11-16 DIAGNOSIS — I4891 Unspecified atrial fibrillation: Secondary | ICD-10-CM | POA: Diagnosis not present

## 2020-11-16 LAB — BASIC METABOLIC PANEL
Anion gap: 10 (ref 5–15)
BUN: 12 mg/dL (ref 8–23)
CO2: 24 mmol/L (ref 22–32)
Calcium: 8.4 mg/dL — ABNORMAL LOW (ref 8.9–10.3)
Chloride: 94 mmol/L — ABNORMAL LOW (ref 98–111)
Creatinine, Ser: 0.89 mg/dL (ref 0.44–1.00)
GFR, Estimated: >60 mL/min (ref >60)
Glucose, Bld: 140 mg/dL — ABNORMAL HIGH (ref 70–99)
Potassium: 3.5 mmol/L (ref 3.5–5.1)
Sodium: 128 mmol/L — ABNORMAL LOW (ref 135–145)

## 2020-11-16 LAB — CBC
HCT: 34.5 % — ABNORMAL LOW (ref 36.0–46.0)
Hemoglobin: 11.2 g/dL — ABNORMAL LOW (ref 12.0–15.0)
MCH: 29.1 pg (ref 26.0–34.0)
MCHC: 32.5 g/dL (ref 30.0–36.0)
MCV: 89.6 fL (ref 80.0–100.0)
Platelets: 337 10*3/uL (ref 150–400)
RBC: 3.85 MIL/uL — ABNORMAL LOW (ref 3.87–5.11)
RDW: 13.9 % (ref 11.5–15.5)
WBC: 14 10*3/uL — ABNORMAL HIGH (ref 4.0–10.5)
nRBC: 0 % (ref 0.0–0.2)

## 2020-11-16 MED ORDER — FUROSEMIDE 40 MG PO TABS
40.0000 mg | ORAL_TABLET | Freq: Every day | ORAL | Status: DC
  Administered 2020-11-17: 40 mg via ORAL
  Filled 2020-11-16: qty 1

## 2020-11-16 MED ORDER — SODIUM CHLORIDE 1 G PO TABS
1.0000 g | ORAL_TABLET | Freq: Two times a day (BID) | ORAL | Status: DC
  Administered 2020-11-16 – 2020-11-17 (×2): 1 g via ORAL
  Filled 2020-11-16 (×3): qty 1

## 2020-11-16 NOTE — Progress Notes (Signed)
Progress Note  Patient Name: Tracy Buck Date of Encounter: 11/16/2020  [facility] Cardiologist: [facility] Dr. Bishop Limbo In [place] denies CP or dyspnea  Inpatient Medications    Scheduled Meds: . (feeding supplement) PROSource Plus  30 mL Oral BID BM  . apixaban  5 mg Oral BID  . Chlorhexidine Gluconate Cloth  6 each Topical Daily  . diltiazem  120 mg Oral Daily  . feeding supplement  237 mL Oral TID BM  . fluticasone furoate-vilanterol  1 puff Inhalation Daily  . furosemide  40 mg Intravenous BID  . loratadine  10 mg Oral Daily  . melatonin  3 mg Oral QHS  . multivitamin with minerals  1 tablet Oral Daily  . nystatin  5 mL Oral QID  . rosuvastatin  20 mg Oral Daily  . sodium chloride flush  10-40 mL Intracatheter Q12H  . umeclidinium bromide  1 puff Inhalation Daily   Continuous Infusions: . sodium chloride Stopped (11/05/20 0847)  . penicillin g continuous IV infusion 12 Million Units (11/16/20 0823)   PRN Meds: sodium chloride, acetaminophen, diphenhydrAMINE, docusate sodium, fentaNYL (SUBLIMAZE) injection, haloperidol lactate, ipratropium-albuterol, LORazepam, oxymetazoline, polyethylene glycol, sodium chloride flush   Vital Signs    Vitals:   11/15/20 2357 11/16/20 0402 11/16/20 0800 11/16/20 0915  BP: 118/64 120/61 123/80   Pulse: 83 83 73   Resp: (!) 23 (!) 29 (!) 29   Temp: 97.6 F (36.4 C) (!) 97.5 F (36.4 C) 97.6 F (36.4 C)   TempSrc: Oral Oral Oral   SpO2: 94% 96% 95% 98%  Weight:  71.4 kg    Height:        Intake/Output Summary (Last 24 hours) at 11/16/2020 1023 Last data filed at 11/16/2020 0536 Gross per 24 hour  Intake --  Output 650 ml  Net -650 ml   Last 3 Weights 11/16/2020 11/14/2020 11/07/2020  Weight (lbs) 157 lb 6.5 oz 162 lb 11.2 oz 172 lb 6.4 oz  Weight (kg) 71.4 kg 73.8 kg 78.2 kg      Telemetry    Atrial fibrillation, rate controlled- Personally Reviewed   Physical Exam   GEN: No acute distress.   WD, chronically ill appearing Neck: supple Cardiac: irregular, no murumur Respiratory: CTA GI: Soft, NT/ND MS: no edema; ecchymoses noted Neuro:  Grossly intact Psych: Normal affect   Labs    High Sensitivity Troponin:   Recent Labs  Lab 11/05/20 0650 11/05/20 1242 11/05/20 1508  TROPONINIHS 1,716* 1,693* 1,404*      Chemistry Recent Labs  Lab 11/10/20 0215 11/10/20 1019 11/14/20 0340 11/15/20 0655 11/16/20 0100  NA  --    < > 128* 129* 128*  K  --    < > 3.4* 4.2 3.5  CL  --    < > 92* 96* 94*  CO2  --    < > 26 26 24   GLUCOSE  --    < > 144* 139* 140*  BUN  --    < > 7* 9 12  CREATININE  --    < > 0.95 0.94 0.89  CALCIUM  --    < > 8.4* 8.4* 8.4*  PROT 5.4*  --   --   --   --   ALBUMIN 1.9*  --   --   --   --   AST 34  --   --   --   --   ALT 34  --   --   --   --  ALKPHOS 86  --   --   --   --   BILITOT 1.3*  --   --   --   --   GFRNONAA  --    < > 59* >60 >60  ANIONGAP  --    < > 10 7 10    < > = values in this interval not displayed.     Hematology Recent Labs  Lab 11/14/20 0340 11/15/20 0655 11/16/20 0100  WBC 11.8* 13.0* 14.0*  RBC 3.90 3.78* 3.85*  HGB 11.6* 11.1* 11.2*  HCT 33.9* 33.3* 34.5*  MCV 86.9 88.1 89.6  MCH 29.7 29.4 29.1  MCHC 34.2 33.3 32.5  RDW 14.3 14.1 13.9  PLT 327 336 337     Patient Profile     86 y.o. female with a hx of AS and hx of then bioprosthetic AV stenosis, TAVR 05/01/20, chronic lung disease, lt lower lobe lobectomy due to malignant carcinoid, CAD non obstructive on cardiac cath 03/2020, HTN, HLDseen for afib in setting of severe sepsis 2nd to duodenitis.  TEE March 2022 showed normal LV function, mild to moderate mitral regurgitation, severe tricuspid regurgitation, previous aortic valve replacement with no aortic stenosis and mean gradient 8 mmHg.  Assessment & Plan    1 Persistent atrial fibrillation-patient's heart rate is controlled.  Continue Cardizem and apixaban. Can consider outpatient cardioversion when  she has been therapeutically anticoagulated for 3 consecutive weeks.  2 acute diastolic congestive heart failure-volume status improved; change lasix to 40 mg daily; follow renal function.  3 mildly elevated troponin-felt to be secondary to demand ischemia.  No plans for further ischemia evaluation.  4 valvular heart disease with previous TAVR, mitral regurgitation and tricuspid regurgitation-no vegetation seen on recent transesophageal echocardiogram.  Will need follow-up echoes as an outpatient.  5 recent sepsis-felt secondary to duodenitis-Per primary care.  For questions or updates, please contact [place] Please consult [email or web address] for contact info under        Signed, Kirk Ruths, MD  11/16/2020, 10:23 AM

## 2020-11-16 NOTE — Progress Notes (Signed)
Pt did receive Melatonin at hs, however c/o not being able to sleep. Pt was given Benadryl 25mg  cap po at 0210. Medication was effective. Will continue to monitor and tx as indicated.

## 2020-11-16 NOTE — Progress Notes (Signed)
PROGRESS NOTE    Tracy Buck  PTW:[id number] DOB: 1935-08-23 DOA: 11/05/2020 PCP: Drake Leach, MD   Brief Narrative:  This 86 year old female with history of COPD, hypertension, bioprosthetic aortic valve, 2021, CAD and lung cancer (carcinoid) s/p lobectomy. She was in her usual state of health until approximately 10/26/20 when she developed progressive fatigue. She has seen her PCP and cardiologist with these complaints without identifiable etiology. It was felt that perhaps polypharmacy was playing a role. Her PRN alprazolam was discontinued and Ambien dose was reduced from 10mg  to 5mg  QHS. On 3/16, early a.m.,  She has developed shortness of breath and chest tightness, presented to [facility].  EKG was felt nonischemic, had atrial fibrillation with RVR.  Patient was started on diltiazem bolus and infusion.  Patient also had low-grade fever 100 F, leukocytosis, placed on empiric antibiotic and sepsis work-up was initiated. Chest x-ray showed left-sided effusion at her former lobectomy site, creatinine 1.3, lactic acid 8.7, troponin 1700, BNP 1682. Patient then became hypotensive and was admitted to ICU.  She was admitted for severe sepsis which was presumed to be due to duodenitis and antibiotics were narrowed, now on IV Zosyn, for 7 to 10 days.  Cardiology was consulted for atrial fibrillation with RVR and abnormal echo changes (worsened MR, TAVR position slightly off). TEE : AVR appears normal.  Patient was transferred to [facility] service on 11/07/2020.  Blood cultures grew actinomyces neuii, infectious disease consulted.  Infectious disease recommended TEE to rule out infection endocarditis. Patient underwent TEE: No vegetation was found.  Infectious disease recommended Penicillin therapy for 1 month followed by oral amoxicillin to reach 6 months to a year of therapy.  Assessment & Plan:   Principal Problem:   Actinomyces infection Active Problems:   S/P AVR (aortic valve  replacement)   Acute respiratory failure with hypoxia (HCC)   Lactic acidosis   Elevated troponin   Atrial fibrillation with RVR (HCC)   Severe sepsis with septic shock (HCC)   Malnutrition of moderate degree   Acute renal failure (HCC)   Bacteremia   Fatigue   Sepsis with acute renal failure without septic shock (HCC)   S/P PICC central line placement   Persistent atrial fibrillation (HCC)  Acute hypoxic respiratory failure secondary to fluid overload: > Improving She was restless and tachypneic on 3/22 , ABG : No PCO retention. She was given Ativan and Haldol,  continued to remain tachypneic. PCCM consulted. She was given Lasix 60 mg IV every 12 hours,  underwent thoracocentesis 0.6L removed.  Appears to be transudate Lasix 60 mg was increased to thrice daily.  She appears much improved. O2 sat 96% on room air. Continue Lasix 60 mg IV BID, monitor intake /output charting. She seems much improved,  sitting comfortably on the chair with no oxygen. IV Lasix changed to Oral lasix.   Severe sepsis presumed to be due to duodenitis, present on admission -Urine culture negative.  Blood culture positive for gram-positive rods, actinomyces neuii -CT renal stone study showed inflammatory changes surrounding the duodenum and adjacent to the head of the pancreas with extension into the region of porta hepatis, concerning for probable duodenitis. -Leukocytosis improving, afebrile -IV antibiotics narrowed to Zosyn (previously on IV vancomycin, cefepime and Flagyl), narrowed down to Unasyn.  -Advance diet to Full liquids to soft blend. -Infectious disease consulted, recommended TEE to rule out infective endocarditis.  TEE shows no vegetations. -Infectious disease recommended penicillin therapy for 1 month followed by oral amoxicillin to  reach 6 months to a year of therapy. -She has had PICC Line on 3/23.  Active Problems: Atrial fibrillation with RVR, New onset -Patient was placed on IV Cardizem  drip, heart rate improved however patient developed hypotension -Cardiology consulted, heart rate remained elevated, started Cardizem 30 mg every 6hrs -Patient was placed on IV heparin, now transitioned to apixaban -Elevated troponin likely due to demand ischemia, atrial fibrillation with RVR, not ACS -2D echo with LVEF of 50 to 55% -Heart rate is controlled on cardizem.  Patient is currently on Eliquis. - HR controlled, Continue Cardizem 120mg  CD daily  Abnormal EKG changes, worsened MR, TR, TAVR position change -Cardiology following, MR is now moderate to severe, TR is now moderate to severe -Blood cultures grew actinomyces neuii, infectious disease recommended TEE. -Patient underwent TEE, no vegetations found.  Acute kidney injury with metabolic acidosis, lactic acidosis > Resolved. -Baseline creatinine 0.9-1.05 -Presented with creatinine of 1.3, lactic acid 8.7 on admission -Patient was placed on bicarb drip, lactic acid improved to 2.3, bicarb normal 24, creatinine 0.9 -Diet advanced to solids today - Acute kidney injury has resolved.  Metabolic acidosis has resolved.  Last CO2 was 24.  COPD -Currently stable, no wheezing, continue outpatient inhalers.  Hypertension -BP currently stable, holding antihypertensives.  Hyperlipidemia Continue statin  Generalized weakness: Patient reports progressive fatigue, dizziness, generalized debility. Patient was seen by her PCP, cardiologist, no identifiable etiology found.   Perhaps polypharmacy could be the reason.  Xanax was discontinued.   Ambien dose decreased from 10 mg to 5 mg, now discontinued -Continue melatonin -Continue PT OT, mobilization, very deconditioned. PT : Home Health services.  Malnutrition of moderate degree Very deconditioned, diet advanced , albumin 2.0 Estimated body mass index is 30.54 kg/m as calculated from the following:   Height as of this encounter: 5\' 3"  (1.6 m).   Weight as of this encounter:  78.2 kg.   DVT prophylaxis: Eliquis Code Status: Full code. Family Communication:  Daughter at bed side. Disposition Plan:   Status is: Inpatient  Remains inpatient appropriate because:Inpatient level of care appropriate due to severity of illness   Dispo: The patient is from: Home              Anticipated d/c is to: Home with Home services. 3/28              Patient currently is not medically stable to d/c.   Difficult to place patient No    Consultants:   Cardiology  Infectious disease.  Procedures: TTE, TEE Antimicrobials:   Anti-infectives (From admission, onward)   Start     Dose/Rate Route Frequency Ordered Stop   11/14/20 0000  penicillin G IVPB        24 Million Units Intravenous Daily 11/14/20 1608 12/09/20 2359   11/11/20 1145  penicillin G potassium 12 Million Units in dextrose 5 % 500 mL continuous infusion        12 Million Units 41.7 mL/hr over 12 Hours Intravenous Every 12 hours 11/11/20 1045     11/09/20 1130  Ampicillin-Sulbactam (UNASYN) 3 g in sodium chloride 0.9 % 100 mL IVPB  Status:  Discontinued        3 g 200 mL/hr over 30 Minutes Intravenous Every 6 hours 11/09/20 1025 11/11/20 1045   11/06/20 0600  vancomycin (VANCOREADY) IVPB 750 mg/150 mL  Status:  Discontinued        750 mg 150 mL/hr over 60 Minutes Intravenous Every 24 hours 11/05/20 0757 11/06/20 0745  11/05/20 2000  ceFEPIme (MAXIPIME) 2 g in sodium chloride 0.9 % 100 mL IVPB  Status:  Discontinued        2 g 200 mL/hr over 30 Minutes Intravenous Every 12 hours 11/05/20 0757 11/05/20 1704   11/05/20 1745  piperacillin-tazobactam (ZOSYN) IVPB 3.375 g  Status:  Discontinued        3.375 g 12.5 mL/hr over 240 Minutes Intravenous Every 8 hours 11/05/20 1720 11/09/20 1015   11/05/20 0730  ceFEPIme (MAXIPIME) 2 g in sodium chloride 0.9 % 100 mL IVPB        2 g 200 mL/hr over 30 Minutes Intravenous  Once 11/05/20 0716 11/05/20 0814   11/05/20 0730  metroNIDAZOLE (FLAGYL) IVPB 500 mg         500 mg 100 mL/hr over 60 Minutes Intravenous  Once 11/05/20 0716 11/05/20 0850   11/05/20 0730  vancomycin (VANCOCIN) IVPB 1000 mg/200 mL premix        1,000 mg 200 mL/hr over 60 Minutes Intravenous  Once 11/05/20 0716 11/05/20 0914      Subjective: Patient was seen and examined at bedside.  Overnight events noted. Patient reports feeling much improved, sitting comfortably on bed.  She is calm, comfortable , her O2 sat 96% on Room air. Her RR is still 22 at rest, denies any shortness of breath.  Objective: Vitals:   11/15/20 2357 11/16/20 0402 11/16/20 0800 11/16/20 0915  BP: 118/64 120/61 123/80   Pulse: 83 83 73   Resp: (!) 23 (!) 29 (!) 29   Temp: 97.6 F (36.4 C) (!) 97.5 F (36.4 C) 97.6 F (36.4 C)   TempSrc: Oral Oral Oral   SpO2: 94% 96% 95% 98%  Weight:  71.4 kg    Height:        Intake/Output Summary (Last 24 hours) at 11/16/2020 1049 Last data filed at 11/16/2020 0536 Gross per 24 hour  Intake -  Output 650 ml  Net -650 ml   Filed Weights   11/07/20 0500 11/14/20 0500 11/16/20 0402  Weight: 78.2 kg 73.8 kg 71.4 kg    Examination:  General exam: Appears calm and comfortable. Not in any acute distress. Respiratory system: Clear to auscultation. Respiratory effort normal. Cardiovascular system: S1 & S 2 heard, RRR. No JVD, murmurs, rubs, gallops or clicks. No pedal edema. Gastrointestinal system: Abdomen is nondistended, soft and nontender.  No organomegaly or masses felt. Normal bowel sounds heard. Central nervous system: Alert and oriented. No focal neurological deficits. Extremities: Symmetric 5 x 5 power.  No edema, no cyanosis, no clubbing. Skin: No rashes, lesions or ulcers Psychiatry: Judgement and insight appear normal. Mood & affect appropriate.     Data Reviewed: I have personally reviewed following labs and imaging studies  CBC: Recent Labs  Lab 11/12/20 0308 11/13/20 0500 11/14/20 0340 11/15/20 0655 11/16/20 0100  WBC 9.0 9.8 11.8*  13.0* 14.0*  HGB 11.1* 10.7* 11.6* 11.1* 11.2*  HCT 33.0* 31.4* 33.9* 33.3* 34.5*  MCV 89.9 88.7 86.9 88.1 89.6  PLT 250 275 327 336 235   Basic Metabolic Panel: Recent Labs  Lab 11/11/20 0101 11/13/20 0500 11/14/20 0340 11/15/20 0655 11/16/20 0100  NA 132* 132* 128* 129* 128*  K 3.8 3.0* 3.4* 4.2 3.5  CL 104 95* 92* 96* 94*  CO2 22 30 26 26 24   GLUCOSE 151* 142* 144* 139* 140*  BUN 7* 5* 7* 9 12  CREATININE 0.75 0.88 0.95 0.94 0.89  CALCIUM 8.1* 8.2* 8.4* 8.4* 8.4*  MG 2.0  --   --  1.9  --   PHOS 3.6  --   --  3.2  --    GFR: Estimated Creatinine Clearance: 45.4 mL/min (by C-G formula based on SCr of 0.89 mg/dL). Liver Function Tests: Recent Labs  Lab 11/10/20 0215  AST 34  ALT 34  ALKPHOS 86  BILITOT 1.3*  PROT 5.4*  ALBUMIN 1.9*   No results for input(s): LIPASE, AMYLASE in the last 168 hours. No results for input(s): AMMONIA in the last 168 hours. Coagulation Profile: No results for input(s): INR, PROTIME in the last 168 hours. Cardiac Enzymes: No results for input(s): CKTOTAL, CKMB, CKMBINDEX, TROPONINI in the last 168 hours. BNP (last 3 results) No results for input(s): PROBNP in the last 8760 hours. HbA1C: No results for input(s): HGBA1C in the last 72 hours. CBG: No results for input(s): GLUCAP in the last 168 hours. Lipid Profile: No results for input(s): CHOL, HDL, LDLCALC, TRIG, CHOLHDL, LDLDIRECT in the last 72 hours. Thyroid Function Tests: No results for input(s): TSH, T4TOTAL, FREET4, T3FREE, THYROIDAB in the last 72 hours. Anemia Panel: No results for input(s): VITAMINB12, FOLATE, FERRITIN, TIBC, IRON, RETICCTPCT in the last 72 hours. Sepsis Labs: No results for input(s): PROCALCITON, LATICACIDVEN in the last 168 hours.  Recent Results (from the past 240 hour(s))  Culture, blood (routine x 2)     Status: None   Collection Time: 11/09/20 10:16 AM   Specimen: BLOOD LEFT HAND  Result Value Ref Range Status   Specimen Description BLOOD LEFT  HAND  Final   Special Requests   Final    BOTTLES DRAWN AEROBIC ONLY Blood Culture results may not be optimal due to an inadequate volume of blood received in culture bottles   Culture   Final    NO GROWTH 5 DAYS Performed at [facility], [place] [address], [place], [place] [address]    Report Status 11/14/2020 FINAL  Final  Culture, blood (routine x 2)     Status: None   Collection Time: 11/09/20 10:24 AM   Specimen: BLOOD RIGHT HAND  Result Value Ref Range Status   Specimen Description BLOOD RIGHT HAND  Final   Special Requests   Final    BOTTLES DRAWN AEROBIC ONLY Blood Culture adequate volume   Culture   Final    NO GROWTH 5 DAYS Performed at [facility], [place] [address], [place], [place] [address]    Report Status 11/14/2020 FINAL  Final  Body fluid culture w Gram Stain     Status: None   Collection Time: 11/11/20  3:49 PM   Specimen: Fluid  Result Value Ref Range Status   Specimen Description FLUID LEFT PLEURAL  Final   Special Requests NONE  Final   Gram Stain   Final    RARE WBC PRESENT, PREDOMINANTLY MONONUCLEAR NO ORGANISMS SEEN    Culture   Final    NO GROWTH Performed at [facility], [place] [address] [place], [place] [id number]    Report Status 11/15/2020 FINAL  Final    Radiology Studies: No results found. Scheduled Meds: . (feeding supplement) PROSource Plus  30 mL Oral BID BM  . apixaban  5 mg Oral BID  . Chlorhexidine Gluconate Cloth  6 each Topical Daily  . diltiazem  120 mg Oral Daily  . feeding supplement  237 mL Oral TID BM  . fluticasone furoate-vilanterol  1 puff Inhalation Daily  . [START ON 11/17/2020] furosemide  40 mg Oral Daily  . loratadine  10 mg Oral Daily  . melatonin  3 mg Oral QHS  . multivitamin with minerals  1 tablet Oral Daily  . nystatin  5 mL Oral QID  . rosuvastatin  20 mg Oral Daily  . sodium chloride flush  10-40 mL Intracatheter Q12H  . umeclidinium bromide  1 puff Inhalation Daily   Continuous  Infusions: . sodium chloride Stopped (11/05/20 0847)  . penicillin g continuous IV infusion 12 Million Units (11/16/20 0823)     LOS: 11 days    Time spent: 25 mins    PARDEEP KUMAR, MD [facility]   If 7PM-7AM, please contact night-coverage

## 2020-11-17 DIAGNOSIS — A429 Actinomycosis, unspecified: Secondary | ICD-10-CM | POA: Diagnosis not present

## 2020-11-17 LAB — CBC
HCT: 34.4 % — ABNORMAL LOW (ref 36.0–46.0)
Hemoglobin: 12 g/dL (ref 12.0–15.0)
MCH: 30.1 pg (ref 26.0–34.0)
MCHC: 34.9 g/dL (ref 30.0–36.0)
MCV: 86.2 fL (ref 80.0–100.0)
Platelets: 335 10*3/uL (ref 150–400)
RBC: 3.99 MIL/uL (ref 3.87–5.11)
RDW: 13.6 % (ref 11.5–15.5)
WBC: 11.5 10*3/uL — ABNORMAL HIGH (ref 4.0–10.5)
nRBC: 0 % (ref 0.0–0.2)

## 2020-11-17 LAB — URINALYSIS, ROUTINE W REFLEX MICROSCOPIC
Bilirubin Urine: NEGATIVE
Glucose, UA: NEGATIVE mg/dL
Ketones, ur: NEGATIVE mg/dL
Nitrite: NEGATIVE
Protein, ur: NEGATIVE mg/dL
Specific Gravity, Urine: 1.013 (ref 1.005–1.030)
pH: 6 (ref 5.0–8.0)

## 2020-11-17 LAB — BASIC METABOLIC PANEL
Anion gap: 8 (ref 5–15)
BUN: 10 mg/dL (ref 8–23)
CO2: 26 mmol/L (ref 22–32)
Calcium: 8.5 mg/dL — ABNORMAL LOW (ref 8.9–10.3)
Chloride: 93 mmol/L — ABNORMAL LOW (ref 98–111)
Creatinine, Ser: 0.78 mg/dL (ref 0.44–1.00)
GFR, Estimated: >60 mL/min (ref >60)
Glucose, Bld: 151 mg/dL — ABNORMAL HIGH (ref 70–99)
Potassium: 3.2 mmol/L — ABNORMAL LOW (ref 3.5–5.1)
Sodium: 127 mmol/L — ABNORMAL LOW (ref 135–145)

## 2020-11-17 MED ORDER — DILTIAZEM HCL ER COATED BEADS 120 MG PO CP24: 120.0000 mg | ORAL_CAPSULE | Freq: Every day | ORAL | 1 refills | Status: AC

## 2020-11-17 MED ORDER — SODIUM CHLORIDE 1 G PO TABS: 1.0000 g | ORAL_TABLET | Freq: Two times a day (BID) | ORAL | 0 refills | Status: DC

## 2020-11-17 MED ORDER — CEPHALEXIN 500 MG PO CAPS: 500.0000 mg | ORAL_CAPSULE | Freq: Two times a day (BID) | ORAL | 0 refills | Status: AC

## 2020-11-17 MED ORDER — APIXABAN 5 MG PO TABS: 5.0000 mg | ORAL_TABLET | Freq: Two times a day (BID) | ORAL | 1 refills | Status: DC

## 2020-11-17 MED ORDER — FUROSEMIDE 40 MG PO TABS
40.0000 mg | ORAL_TABLET | Freq: Every day | ORAL | 1 refills | Status: AC
Start: 2020-11-18 — End: ?

## 2020-11-17 MED ORDER — POTASSIUM CHLORIDE 20 MEQ PO PACK
40.0000 meq | PACK | Freq: Once | ORAL | Status: AC
  Administered 2020-11-17: 40 meq via ORAL
  Filled 2020-11-17: qty 2

## 2020-11-17 NOTE — Discharge Instructions (Signed)
Advised to follow-up with primary care physician in 1 week.   Advised to follow-up with primary cardiologist at [facility], appointment has been made. Advised to take penicillin daily through PICC line. Advised to remove PICC line at the completion of IV antibiotics in 4 weeks. Advised to take sodium chloride tablets twice a day for next 3 days. Patient was started on Cardizem 120 mg daily and Eliquis 5 mg twice daily for A. Fib.       Information on my medicine - ELIQUIS (apixaban)  This medication education was reviewed with me or my healthcare representative as part of my discharge preparation.    Why was Eliquis prescribed for you? Eliquis was prescribed for you to reduce the risk of a blood clot forming that can cause a stroke if you have a medical condition called atrial fibrillation (a type of irregular heartbeat).  What do You need to know about Eliquis ? Take your Eliquis TWICE DAILY - one tablet in the morning and one tablet in the evening with or without food. If you have difficulty swallowing the tablet whole please discuss with your pharmacist how to take the medication safely.  Take Eliquis exactly as prescribed by your doctor and DO NOT stop taking Eliquis without talking to the doctor who prescribed the medication.  Stopping may increase your risk of developing a stroke.  Refill your prescription before you run out.  After discharge, you should have regular check-up appointments with your healthcare provider that is prescribing your Eliquis.  In the future your dose may need to be changed if your kidney function or weight changes by a significant amount or as you get older.  What do you do if you miss a dose? If you miss a dose, take it as soon as you remember on the same day and resume taking twice daily.  Do not take more than one dose of ELIQUIS at the same time to make up a missed dose.  Important Safety Information A possible side effect of Eliquis  is bleeding. You should call your healthcare provider right away if you experience any of the following: ? Bleeding from an injury or your nose that does not stop. ? Unusual colored urine (red or dark brown) or unusual colored stools (red or black). ? Unusual bruising for unknown reasons. ? A serious fall or if you hit your head (even if there is no bleeding).  Some medicines may interact with Eliquis and might increase your risk of bleeding or clotting while on Eliquis. To help avoid this, consult your healthcare provider or pharmacist prior to using any new prescription or non-prescription medications, including herbals, vitamins, non-steroidal anti-inflammatory drugs (NSAIDs) and supplements.  This website has more information on Eliquis (apixaban): http://www.eliquis.com/eliquis/home  Suggestions For Increasing Calories And Protein . Several small meals a day are easier to eat and digest than three large ones. Space meals about 2 to 3 hours apart to maximize comfort. . Stop eating 2 to 3 hours before bed and sleep with your head elevated if gastric reflux (GERD) and heartburn are problems. . Do not eat your favorite foods if you are feeling bad. Save them for when you feel good! . Eat breakfast-type foods at any meal. Eggs are usually easy to eat and are great any time of the day. (The same goes for pancakes and waffles.) . Eat when you feel hungry. Most people have the greatest appetite in the morning because they have not eaten all night. If  this is the best meal for you, then pile on those calories and other nutrients in the morning and at lunch. Then you can have a smaller dinner without losing total calories for the day. . Eat leftovers or nutritious snacks in the afternoon and early evening to round out your day. . Try homemade or commercially prepared nutrition bars and puddings, as well as calorie- and protein-rich liquid nutritional supplements. Benefits of Physical Activity Talk  to your doctor about physical activity. Light or moderate physical activity can help maintain muscle and promote an appetite. Walking in the neighborhood or the local mall is a great way to get up, get out, and get moving. If you are unsteady on your feet, try walking around the dining room table. Save Room for [employer]! Drink most fluids between meals instead of with meals. (It is fine to have a sip to help swallow food at meal time.) Fluids (which usually have fewer calories and nutrients than solid food) can take up valuable space in your stomach.  Foods Recommended High-Protein Foods Milk products Add cheese to toast, crackers, sandwiches, baked potatoes, vegetables, soups, noodles, meat, and fruit. Use reduced-fat (2%) or whole milk in place of water when cooking cereal and cream soups. Include cream sauces on vegetables and pasta. Add powdered milk to cream soups and mashed potatoes.  Eggs Have hard-cooked eggs readily available in the refrigerator. Chop and add to salads, casseroles, soups, and vegetables. Make a quick egg salad. All eggs should be well cooked to avoid the risk of harmful bacteria.  Meats, poultry, and fish Add leftover cooked meats to soups, casseroles, salads, and omelets. Make dip by mixing diced, chopped, or shredded meat with sour cream and spices.  Beans, legumes, nuts, and seeds Sprinkle nuts and seeds on cereals, fruit, and desserts such as ice cream, pudding, and custard. Also serve nuts and seeds on vegetables, salads, and pasta. Spread peanut butter on toast, bread, English muffins, and fruit, or blend it in a milk shake. Add beans and peas to salads, soups, casseroles, and vegetable dishes.  High-Calorie Foods Butter, margarine, and  oils Melt butter or margarine over potatoes, rice, pasta, and cooked vegetables. Add melted butter or margarine into soups and casseroles and spread on bread for sandwiches before spreading sandwich spread or peanut butter. Saut  or stir-fry vegetables, meats, chicken and fish such as shrimp/scallops in olive or canola oil. A variety of oils add calories and can be used to [employer], chicken, or fish.  Milk products Add whipping cream to desserts, pancakes, waffles, fruit, and hot chocolate, and fold it into soups and casseroles. Add sour cream to baked potatoes and vegetables.  Salad dressing Use regular (not low-fat or diet) mayonnaise and salad dressing on sandwiches and in dips with vegetables and fruit.   Sweets Add jelly and honey to bread and crackers. Add jam to fruit and ice cream and as a topping over cake.   Copyright 2020  Academy of [employer] Diet[employer]. All rights reserved.  [facility] Stay Proper nutrition can help your body recover from illness and injury.   Foods and beverages high in protein, vitamins, and minerals help rebuild muscle loss, promote healing, & reduce fall risk.   .In addition to eating healthy foods, a nutrition shake is an easy, delicious way to get the nutrition you need during and after your hospital stay  It is recommended that you continue to drink 2 bottles per day of:  Ensure Complete (or equivalent) for at least 1 month (30 days) after your hospital stay   Tips for adding a nutrition shake into your routine: As allowed, drink one with vitamins or medications instead of water or juice Enjoy one as a tasty mid-morning or afternoon snack Drink cold or make a milkshake out of it Drink one instead of milk with cereal or snacks Use as a coffee creamer   Available at the following grocery stores and pharmacies:           * [employer] [phone]            For COUPONS visit: www.ensure.com/join or http://dawson-may.com/   Suggested Substitutions Ensure Plus = Boost Plus = Carnation  Breakfast Essentials = Boost Compact Ensure Active Clear = Boost Breeze Glucerna Shake = Boost Glucose Control = Carnation Breakfast Essentials SUGAR FREE

## 2020-11-17 NOTE — Plan of Care (Signed)

## 2020-11-17 NOTE — Discharge Summary (Signed)
Physician Discharge Summary  Tracy Buck FXT:[id number] DOB: 06/09/1935 DOA: 11/05/2020  PCP: Forrestine Him, PA-C  Admit date: 11/05/2020   Discharge date: 11/17/2020  Admitted From: Home Disposition:  [place] services  Recommendations for Outpatient Follow-up:  1. Follow up with PCP in 1-2 weeks. 2. Please obtain BMP/CBC in one week. 3. Advised to follow-up with primary cardiologist at [facility], appointment has been made. 4. Advised to take penicillin daily through PICC line. 5. Advised to remove PICC line at the completion of IV antibiotics in 4 weeks. 6. Advised to take sodium chloride tablets twice a day for next 3 days. 7. Patient has been discharged on Cardizem 120 mg daily and Eliquis 5 mg twice daily for atrial fibrillation. 8. Advised to take Keflex twice a day for 3 days for UTI   Home Health: Home PT and OT, nurse Equipment/Devices: PICC line  Discharge Condition: Stable CODE STATUS:Full code Diet recommendation: Heart Healthy  Brief Summary: This 86 year old female with history of COPD, hypertension, bioprosthetic aortic valve in 2021, CADand lung cancer (carcinoid) s/p lobectomy. She was in her usual state of health until approximately 10/26/20 when she developed progressive fatigue. She has seen her PCP and cardiologist with these complaints without identifiable etiology. It was felt that perhaps polypharmacy was playing a role. Her PRN alprazolam was discontinued and Ambien dose was reduced from 11m to 572mQHS. On 3/16, early a.m.,  She has developed shortness of breath and chest tightness, presented to meLeeEKG was felt nonischemic, had atrial fibrillation with RVR. Patient was started on diltiazem bolus and infusion. Patient also had low-grade fever 100 F, leukocytosis, placed on empiric antibiotic and sepsis work-up was initiated. Chest x-ray showed left-sided effusion at her former lobectomy site, creatinine 1.3, lactic acid 8.7,  troponin 1700, BNP 1682. Patient then became hypotensive and was admitted to ICU.   Hospital Course: She was admitted for severe sepsis which was presumed to be due to duodenitis and started on broad-spectrum antibiotics. Antibiotics were narrowed down to IV Zosyn, infectious disease was consulted.  Cardiology was consulted for atrial fibrillation with RVR and abnormal echo changes (worsened MR, TAVR position slightly off).  Patient underwent TEE to rule out vegetations.  TEE : AVR appears normal.  No vegetations were found.  Patient was transferred out of ICU 11/07/2020. Blood cultures grew actinomyces neuii, Infectious disease recommended TEE to rule out infection endocarditis.  Infectious disease recommended Penicillin G therapy for 1 month followed by oral amoxicillin to reach 6 months to a year of therapy.  Patient has had PICC line to continue IV antibiotic therapy.  Patient has developed fluid overload, was aggressively diuresed for couple of days.  Patient seemed significantly improved.  Heart rate is well controlled on Cardizem CD 120 mg daily.  Patient is started on Eliquis 5 mg twice daily for atrial fibrillation.  She has developed hyponatremia and started on sodium tablets.  Patient is cleared from cardiology to be discharged.  Patient has developed burning urination on the day of discharge.  Discussed with infectious disease recommended continue penicillin G and  Start Keflex 500 mg twice daily for 3 days.  Patient seems improved and wants to be discharged.  Home health services(PT, OT, RN has been arranged).  She was managed for below problems  Discharge Diagnoses:  Principal Problem:   Actinomyces infection Active Problems:   S/P AVR (aortic valve replacement)   Acute respiratory failure with hypoxia (HCC)   Lactic acidosis  Elevated troponin   Atrial fibrillation with RVR (HCC)   Severe sepsis with septic shock (HCC)   Malnutrition of moderate degree   Acute renal failure (HCC)    Bacteremia   Fatigue   Sepsis with acute renal failure without septic shock (HCC)   S/P PICC central line placement   Persistent atrial fibrillation (HCC)  Acute hypoxic respiratory failure secondary to fluid overload: > Resolved. She was restless and tachypneic on 3/22 , ABG : No PCO retention. She was given Ativan and Haldol,  continued to remain tachypneic. PCCM consulted. She was given Lasix 60 mg IV every 12 hours,  underwent thoracocentesis 0.6L removed.  Appears to be transudate Lasix 60 mg was increased to thrice daily.  She appears much improved. O2 sat 96% on room air. Continue Lasix 60 mg IV BID, monitor intake /output charting. She seems much improved,  sitting comfortably on the chair with no oxygen. IV Lasix changed to Oral lasix.   Severe sepsis presumed to be due to duodenitis, present on admission -Urine culture negative. Blood culture positive for gram-positive rods, actinomyces neuii -CT renal stone study showed inflammatory changes surrounding the duodenum and adjacent to the head of the pancreas with extension into the region of porta hepatis, concerning for probable duodenitis. -Leukocytosis improving, afebrile -IV antibiotics narrowed to Zosyn (previously on IV vancomycin, cefepime and Flagyl), narrowed down to Unasyn.  -Advance diet to Full liquids to soft blend. -Infectious disease consulted, recommended TEE to rule out infective endocarditis.  TEE shows no vegetations. -Infectious disease recommended penicillin therapy for 1 month followed by oral amoxicillin to reach 6 months to a year of therapy. -She has had PICC Line on 3/23.  Active Problems: Atrial fibrillation with RVR, New onset -Patient was placed on IV Cardizem drip, heart rate improved however patient developed hypotension -Cardiology consulted, heart rate remained elevated, started Cardizem 30 mg every 6hrs -Patient was placed on IV heparin, now transitioned to apixaban -Elevated troponin likely  due to demand ischemia, atrial fibrillation with RVR, not ACS -2D echo with LVEF of 50 to 55% -Heart rate is controlled on cardizem. Patient is currently on Eliquis. - HR controlled, Continue Cardizem 19m CD daily -Continue Eliquis bid.  Abnormal EKG changes, worsened MR, TR, TAVR position change -Cardiology following, MR is now moderate to severe, TR is now moderate to severe -Blood cultures grew actinomyces neuii, infectious disease recommended TEE. -Patient underwent TEE, no vegetations found.  Acute kidney injury with metabolic acidosis, lactic acidosis > Resolved. -Baseline creatinine 0.9-1.05 -Presented with creatinine of 1.3, lactic acid 8.7 on admission -Patient was placed on bicarb drip, lactic acid improved to 2.3, bicarb normal 24, creatinine 0.9 -Diet advanced to solids today - Acute kidney injury has resolved. Metabolic acidosis has resolved. Last CO2 was 24.  COPD -Currently stable, no wheezing, continue outpatient inhalers.  Hypertension -BP currently stable.  Hyperlipidemia Continue statin  Generalized weakness: Patient reports progressive fatigue, dizziness, generalized debility. Patient was seen by her PCP, cardiologist, no identifiable etiology found.  Perhaps polypharmacy could be the reason. Xanax was discontinued.  Ambien dose decreased from 10 mg to 5 mg, now discontinued -Continue melatonin -Continue PT OT, mobilization, very deconditioned. PT : Home Health services.  Malnutrition of moderate degree Very deconditioned, diet advanced , albumin 2.0 Estimated body mass index is 30.54 kg/m as calculated from the following: Height as of this encounter: _0  (1.6 m). Weight as of this encounter: 78.2 kg.   Discharge Instructions  Discharge Instructions  [facility] pharmacist to adjust dose for Vancomycin, Aminoglycosides and other anti-infective therapies as requested by physician.   Complete by: As directed     [facility] to provide Cath Flo 40m   Complete by: As directed    Administer for PICC line occlusion and as ordered by physician for other access device issues.   Anaphylaxis Kit: Provided to treat any anaphylactic reaction to the medication being provided to the patient if First Dose or when requested by physician   Complete by: As directed    Epinephrine 165mml vial / amp: Administer 0.76m80m0.76ml74mubcutaneously once for moderate to severe anaphylaxis, nurse to call physician and pharmacy when reaction occurs and call 911 if needed for immediate care   Diphenhydramine 50mg15mIV vial: Administer 25-50mg 29mM PRN for first dose reaction, rash, itching, mild reaction, nurse to call physician and pharmacy when reaction occurs   Sodium Chloride 0.9% NS 500ml I59mdminister if needed for hypovolemic blood pressure drop or as ordered by physician after call to physician with anaphylactic reaction   Call MD for:  difficulty breathing, headache or visual disturbances   Complete by: As directed    Call MD for:  persistant dizziness or light-headedness   Complete by: As directed    Call MD for:  persistant nausea and vomiting   Complete by: As directed    Change dressing on IV access line weekly and PRN   Complete by: As directed    Diet - low sodium heart healthy   Complete by: As directed    Diet Carb Modified   Complete by: As directed    Discharge instructions   Complete by: As directed    Advised to follow-up with primary care physician in 1 week.   [facility]. Advised to take penicillin daily through PICC line. Advised to remove PICC line at the completion of IV antibiotics in 4 weeks. Advised to take sodium chloride tablets twice a day for next 3 days.   Discharge wound care:   Complete by: As directed    Follow-up primary care.   Flush IV access with Sodium Chloride 0.9% and Heparin 10  units/ml or 100 units/ml   Complete by: As directed    Home infusion instructions - [facility]   Complete by: As directed    Instructions: Flush IV access with Sodium Chloride 0.9% and Heparin 10units/ml or 100units/ml   Change dressing on IV access line: Weekly and PRN   Instructions Cath Flo 2mg: Ad46mister for PICC Line occlusion and as ordered by physician for other access device   [facility] pharmacist to adjust dose for: Vancomycin, Aminoglycosides and other anti-infective therapies as requested by physician   Increase activity slowly   Complete by: As directed    Method of administration may be changed at the discretion of home infusion pharmacist based upon assessment of the patient and/or caregiver's ability to self-administer the medication ordered   Complete by: As directed      Allergies as of 11/17/2020      Reactions   Fig Extract [ficus] Anaphylaxis   Citalopram Hydrobromide Other (See Comments)   Pt unsure if this is accurate   Sulfa Antibiotics Other (See Comments)   Severe nervousness.   Prednisone Other (See Comments)   Other reaction(s): Other (See Comments) So nervous I could climb the walls So nervous I could climb the walls  Medication List    STOP taking these medications   aspirin 81 MG chewable tablet   benzonatate 200 MG capsule Commonly known as: TESSALON   lisinopril-hydrochlorothiazide 20-12.5 MG tablet Commonly known as: ZESTORETIC   ondansetron 4 MG tablet Commonly known as: ZOFRAN     TAKE these medications   acetaminophen 500 MG tablet Commonly known as: TYLENOL Take 500 mg by mouth every 6 (six) hours as needed. For pain.   albuterol 108 (90 Base) MCG/ACT inhaler Commonly known as: VENTOLIN HFA Inhale 2 puffs into the lungs every 6 (six) hours as needed. Patient uses 2 puffs of this medication at 6 am.   albuterol (2.5 MG/3ML) 0.083% nebulizer solution Commonly known as: PROVENTIL Take 2.5 mg by  nebulization every 6 (six) hours as needed.   apixaban 5 MG Tabs tablet Commonly known as: ELIQUIS Take 1 tablet (5 mg total) by mouth 2 (two) times daily.   calcium carbonate 420 MG Chew chewable tablet Commonly known as: TITRALAC Chew 420 mg by mouth daily as needed for heartburn.   cephALEXin 500 MG capsule Commonly known as: KEFLEX Take 1 capsule (500 mg total) by mouth 2 (two) times daily for 10 days.   diltiazem 120 MG 24 hr capsule Commonly known as: CARDIZEM CD Take 1 capsule (120 mg total) by mouth daily. Start taking on: November 18, 2020   docusate sodium 100 MG capsule Commonly known as: COLACE Take 100 mg by mouth 2 (two) times daily.   Fluticasone-Salmeterol 250-50 MCG/DOSE Aepb Commonly known as: ADVAIR Inhale 1 puff into the lungs every 12 (twelve) hours.   furosemide 40 MG tablet Commonly known as: LASIX Take 1 tablet (40 mg total) by mouth daily. Start taking on: November 18, 2020   loratadine 10 MG tablet Commonly known as: CLARITIN Take 10 mg by mouth daily.   penicillin G  IVPB Inject 24 Million Units into the vein daily for 25 days. As a continuous infusion. Indication:  Actinomyces Bacteremia  First Dose: No Last Day of Therapy:  12/07/2020 Labs - Once weekly:  CBC/D and BMP, Labs - Every other week:  ESR and CRP Method of administration: Elastomeric (Continuous infusion) Method of administration may be changed at the discretion of home infusion pharmacist based upon assessment of the patient and/or caregiver's ability to self-administer the medication ordered.   rosuvastatin 20 MG tablet Commonly known as: CRESTOR Take 20 mg by mouth daily.   sodium chloride 1 g tablet Take 1 tablet (1 g total) by mouth 2 (two) times daily with a meal.   tiotropium 18 MCG inhalation capsule Commonly known as: SPIRIVA Place 18 mcg into inhaler and inhale daily.   zolpidem 10 MG tablet Commonly known as: AMBIEN Take 5 mg by mouth at bedtime as needed for  sleep.            Discharge Care Instructions  (From admission, onward)         Start     Ordered   11/17/20 0000  Discharge wound care:       Comments: Follow-up primary care.   11/17/20 1103   11/14/20 0000  Change dressing on IV access line weekly and PRN  (Home infusion instructions - [facility] )        11/14/20 1608          Follow-up Information    [facility] Follow up.   Specialty: Home Health Services Why: Alvis Lemmings will contact you to schedule your first  home visit.  Contact information: [address] [place] [address] [phone]        Forrestine Him, PA-C. Go on 11/19/2020.   Specialty: Family Medicine Why: Please attend your hospital discharge appointment with Forrestine Him PA on Wednesday, 11/19/20, at 10am.  Contact information: [address] [place] [address] [phone]        Bishop Limbo R., DO Follow up in 1 week(s).   Specialty: Cardiology Contact information: [address] [place] [address] [phone]        [facility] Follow up.   Why: [facility] will assist with the IV antibiotics.  Contact information: [address],  [place], [place] [address] P: [phone]             Allergies  Allergen Reactions  . Fig Extract [Ficus] Anaphylaxis  . Citalopram Hydrobromide Other (See Comments)    Pt unsure if this is accurate  . Sulfa Antibiotics Other (See Comments)    Severe nervousness.  . Prednisone Other (See Comments)    Other reaction(s): Other (See Comments) So nervous I could climb the walls So nervous I could climb the walls     Consultations:  Cardiology, PCCM,   Procedures/Studies: CT CHEST WO CONTRAST  Result Date: 11/05/2020 CLINICAL DATA:  86 year old female with history of respiratory failure. Low back pain. EXAM: CT CHEST, ABDOMEN AND PELVIS WITHOUT CONTRAST TECHNIQUE: Multidetector CT imaging of the  chest, abdomen and pelvis was performed following the standard protocol without IV contrast. COMPARISON:  Chest CT 03/27/2020. CT the abdomen and pelvis 10/29/2013. FINDINGS: CT CHEST FINDINGS Cardiovascular: Heart size is normal. There is no significant pericardial fluid, thickening or pericardial calcification. There is aortic atherosclerosis, as well as atherosclerosis of the great vessels of the mediastinum and the coronary arteries, including calcified atherosclerotic plaque in the left main, left anterior descending, left circumflex and right coronary arteries. Status post median sternotomy for CABG. Status post TAVR. Mild calcifications of the mitral annulus. Mediastinum/Nodes: No pathologically enlarged mediastinal or hilar lymph nodes. Please note that accurate exclusion of hilar adenopathy is limited on noncontrast CT scans. Esophagus is unremarkable in appearance. No axillary lymphadenopathy. Lungs/Pleura: Status post left lower lobectomy with compensatory hyperexpansion of the left upper lobe. Several small pulmonary nodules are stable in number and size compared to the prior examination, largest of which is a 7 mm left upper lobe pulmonary nodule (axial image 73 of series 4). No other new suspicious appearing pulmonary nodules or masses are noted. Small bilateral pleural effusions lying dependently with some areas of passive subsegmental atelectasis in the right lower lobe and dependent portion of the left upper lobe. Musculoskeletal: Median sternotomy wires. There are no aggressive appearing lytic or blastic lesions noted in the visualized portions of the skeleton. CT ABDOMEN PELVIS FINDINGS Hepatobiliary: No definite suspicious cystic or solid hepatic lesions are confidently identified on today's noncontrast CT examination. Status post cholecystectomy. Pancreas: No definite pancreatic mass or peripancreatic fluid collections. Peripancreatic inflammatory changes noted lateral to the head of the  pancreas (in close association with the duodenum and porta hepatis). Spleen: Unremarkable. Adrenals/Urinary Tract: No calcifications are identified in the collecting system of either kidney, along the course of either ureter, or within the lumen of the urinary bladder. No hydroureteronephrosis. Multiple renal lesions bilaterally, incompletely characterized on today's non-contrast CT examination, but likely to represent cysts, largest of which is exophytic extending off the upper pole of the right kidney  measuring 5.5 cm in diameter. Unenhanced appearance of the urinary bladder is normal. Bilateral adrenal glands are normal in appearance. Stomach/Bowel: Unenhanced appearance of the stomach is normal. Inflammatory changes noted surrounding the duodenum, extending into the adjacent porta hepatis. No pathologic dilatation of small bowel or colon. Numerous colonic diverticulae are noted, without surrounding inflammatory changes to suggest a colonic diverticulitis at this time. Normal appendix. Vascular/Lymphatic: Aortic atherosclerosis. No lymphadenopathy noted in the abdomen or pelvis. Reproductive: Uterus and ovaries are unremarkable in appearance. Other: Trace volume of ascites adjacent to the liver. No pneumoperitoneum. Musculoskeletal: There are no aggressive appearing lytic or blastic lesions noted in the visualized portions of the skeleton. IMPRESSION: 1. Inflammatory changes surrounding the duodenum, and adjacent to the head of the pancreas, with extension into the region of the porta hepatis. Findings are concerning for probable duodenitis. No inflammatory changes are noted adjacent to the body or tail of the pancreas, but correlation with lipase levels is also recommended. 2. Small bilateral pleural effusions lying dependently with some associated passive subsegmental atelectasis in the dependent portions of the lungs. 3. Trace volume of ascites most evident adjacent to the liver. 4. Colonic diverticulosis  without evidence of acute diverticulitis at this time. 5. Aortic atherosclerosis, in addition to left main and 3 vessel coronary artery disease. Status post CABG and TAVR. 6. All previously noted small pulmonary nodules are stable compared to prior examinations dating back to at least 2019, considered benign. 7. Additional incidental findings, as above. Electronically Signed   By: Vinnie Langton M.D.   On: 11/05/2020 16:48   CT CHEST ABDOMEN PELVIS W CONTRAST  Addendum Date: 11/10/2020   ADDENDUM REPORT: 11/10/2020 00:29 ADDENDUM: Additional impression point: New edematous changes and skin thickening across the mons pubis. Possibly related to some developing body wall edema though recommend correlation with direct visualization to exclude cellulitis. Addendum will be called to the ordering clinician or representative by the Radiologist Assistant, and communication documented in the PACS or [employer]. Electronically Signed   By: Lovena Le M.D.   On: 11/10/2020 00:29   Result Date: 11/10/2020 CLINICAL DATA:  Sepsis, history of COPD, hypertension, bioprosthetic aortic valve replacement, CAD and carcinoid lung cancer post lobectomy EXAM: CT CHEST, ABDOMEN, AND PELVIS WITH CONTRAST TECHNIQUE: Multidetector CT imaging of the chest, abdomen and pelvis was performed following the standard protocol during bolus administration of intravenous contrast. CONTRAST:  35mL OMNIPAQUE IOHEXOL 300 MG/ML  SOLN COMPARISON:  CT 11/05/2020 FINDINGS: CT CHEST FINDINGS Cardiovascular: Cardiac size is top normal. No sizable pericardial effusion, abnormal thickening or pericardial calcification. Coronary artery calcifications are present. Post TAVR. Mitral annular calcifications are seen. Prior sternotomy and CABG. Atherosclerotic plaque within the normal caliber aorta. Calcifications present in the proximal great vessels with shared origin of brachiocephalic and left common carotid artery. No acute abnormality the aorta or  proximal great vessels. Central pulmonary arteries are normal caliber. No large central filling defects within the limitations of this non tailored examination of the pulmonary arteries. Mediastinum/Nodes: Postsurgical changes anterior mediastinum related to prior sternotomy and CABG. No mediastinal fluid or gas. Normal thyroid gland and thoracic inlet. No acute abnormality of the trachea or esophagus. No worrisome mediastinal, hilar or axillary adenopathy. Surgical material in the left hilum likely related prior lobectomy. Lungs/Pleura: Prior left lower lobectomy with relative hyperexpansion of the left upper lobe. Redemonstration of the scattered small pulmonary nodules in both lungs, largest in the left upper lobe measuring up to 7 mm in size (5/76). No  new conspicuous or enlarging pulmonary nodules are seen. There are small bilateral pleural effusions with slightly lobular margins which could reflect some underlying loculation. Adjacent areas of passive atelectasis in both lung bases. Underlying airspace disease is difficult to fully exclude. Some mild interlobular septal thickening is noted towards the lung apices. Mild pulmonary vascular redistribution is noted as well. Minimal airways thickening and scattered secretions. Musculoskeletal: Prior sternotomy with intact and aligned sternal sutures. No aggressive lytic or blastic lesions. Exaggerated thoracic kyphosis and mild dextrocurvature of the spine. Multilevel degenerative changes are present in the imaged portions of the spine. Additional degenerative changes in the shoulders. CT ABDOMEN PELVIS FINDINGS Hepatobiliary: Hypoattenuation adjacent the falciform ligament likely reflecting some regions of focal fatty infiltration. More diffuse hepatic hypoattenuation likely reflecting more mild global hepatic steatosis. Smooth liver surface contour. No concerning focal liver lesion. Gallbladder appears surgically absent. Slight prominence of the biliary tree is  similar to comparison imaging and likely reflect some post cholecystectomy reservoir effect. Pancreas: No pancreatic ductal dilatation or surrounding inflammatory changes. Spleen: Normal in size. No concerning splenic lesions. Adrenals/Urinary Tract: Normal adrenal glands. The kidneys enhance and excrete symmetrically. Redemonstration of an exophytic, lobular fluid attenuation cyst arising from the upper pole right kidney. Additional subcentimeter hypoattenuating foci are present in both kidneys too small to fully characterize on CT imaging but statistically likely benign. Bilateral extrarenal pelves are again seen. No concerning renal mass, obstructive urolithiasis or hydronephrosis. Stomach/Bowel: Small hiatal hernia. Stomach is otherwise unremarkable. The previously seen inflammatory changes centered upon the duodenum near the porta hepatis/gallbladder fossa have significantly diminished from the comparison exam. No extraluminal gas, organized abscess or collection is seen. Noninflamed small air-filled duodenal diverticulum near the ligament of Treitz (6/57). Normal sweep across the midline abdomen. No small bowel thickening or dilatation. No evidence of obstruction. No colonic dilatation or wall thickening. Scattered colonic diverticula without focal inflammation to suggest diverticulitis. Vascular/Lymphatic: Atherosclerotic calcifications within the abdominal aorta and branch vessels. No aneurysm or ectasia. No enlarged abdominopelvic lymph nodes. Reproductive: Normal appearance of the uterus and adnexal structures. Other: Mild body wall edema most pronounced in the posterior midline and flanks. Some slightly more focal edematous change in skin thickening noted along the mons pubis, new from prior. Tiny fat containing umbilical hernia. No bowel containing hernias. No free air or fluid. Notable resolution of the previously seen perihepatic ascites. No organized abscess or collection. Musculoskeletal: No acute  osseous abnormality or suspicious osseous lesion. Stable hemangioma L1. Multilevel degenerative changes are present in the imaged portions of the spine. Additional degenerative changes in the hips and pelvis. IMPRESSION: 1. The previously seen inflammatory changes centered upon the duodenum near the porta hepatis/gallbladder fossa have significantly diminished from the comparison exam. No extraluminal gas, organized abscess or collection is seen. 2. Small bilateral pleural effusions with slightly lobular margins which could reflect some underlying loculation. Adjacent areas of passive atelectasis in both lung bases. Underlying airspace disease is difficult to fully exclude. 3. Mild pulmonary vascular redistribution, suggestive of mild interstitial edema. 4. Stable appearance of the scattered small pulmonary nodules in both lungs, largest in the left upper lobe measuring up to 7 mm in size. 5. Colonic diverticulosis without evidence of diverticulitis. 6. Hepatic steatosis. 7. Coronary atherosclerosis with evidence of prior sternotomy, CABG and TAVR. 8. Aortic Atherosclerosis (ICD10-I70.0). Electronically Signed: By: Lovena Le M.D. On: 11/09/2020 23:13   CT MAXILLOFACIAL W CONTRAST  Result Date: 11/11/2020 CLINICAL DATA:  Actinomyces bacteremia EXAM: CT MAXILLOFACIAL WITH CONTRAST  TECHNIQUE: Multidetector CT imaging of the maxillofacial structures was performed with intravenous contrast. Multiplanar CT image reconstructions were also generated. CONTRAST:  76mL OMNIPAQUE IOHEXOL 300 MG/ML  SOLN COMPARISON:  None. FINDINGS: Osseous: No facial fracture or focal osseous abnormality. Orbits: The globes are intact. Normal appearance of the intra- and extraconal fat. Symmetric extraocular muscles. Sinuses: No fluid levels or advanced mucosal thickening. Soft tissues: Normal visualized extracranial soft tissues. Limited intracranial: Normal. IMPRESSION: Paranasal sinuses clear. Electronically Signed   By: Ulyses Jarred  M.D.   On: 11/11/2020 22:41   DG Chest Port 1 View  Result Date: 11/12/2020 CLINICAL DATA:  Status post PICC line placement EXAM: PORTABLE CHEST 1 VIEW COMPARISON:  11/11/2020 FINDINGS: Cardiac shadow is stable. Changes of prior TAVR and median sternotomy are again seen and stable. New right-sided PICC line is noted with the catheter tip at the cavoatrial junction in satisfactory position. Small bilateral pleural effusions are again identified right greater than left. No new focal abnormality is seen. IMPRESSION: Right-sided PICC line in satisfactory position as described. The remainder of the exam is stable from the previous day. Electronically Signed   By: Inez Catalina M.D.   On: 11/12/2020 12:32   DG CHEST PORT 1 VIEW  Result Date: 11/11/2020 CLINICAL DATA:  Status post thoracentesis EXAM: PORTABLE CHEST 1 VIEW COMPARISON:  Earlier today at 3:08 p.m. FINDINGS: 3:44 p.m. Right larger than left pleural effusions. The left-sided effusion is likely smaller. No pneumothorax. Midline trachea. Mild cardiomegaly with transverse aortic atherosclerosis. Mild interstitial edema. Minimal improvement in bibasilar airspace disease. IMPRESSION: No pneumothorax or other complication after thoracentesis. Congestive heart failure with minimal improvement in bibasilar airspace disease. Aortic Atherosclerosis (ICD10-I70.0). Electronically Signed   By: Abigail Miyamoto M.D.   On: 11/11/2020 16:16   DG CHEST PORT 1 VIEW  Result Date: 11/11/2020 CLINICAL DATA:  Tachypnea. Hypertension. Prior aortic valve replacement. EXAM: PORTABLE CHEST 1 VIEW COMPARISON:  11/06/2020 FINDINGS: Stable cardiomegaly. Prior CABG and TAVR again noted. Increased diffuse interstitial infiltrates consistent with interstitial edema. Small to moderate bilateral pleural effusions are seen, left side greater than right, also new since previous study. IMPRESSION: Increased diffuse interstitial edema.  Stable cardiomegaly. Increased small to moderate  bilateral pleural effusions and bibasilar atelectasis. Electronically Signed   By: Marlaine Hind M.D.   On: 11/11/2020 15:40   DG Chest Port 1 View  Result Date: 11/06/2020 CLINICAL DATA:  Hypoxia EXAM: PORTABLE CHEST 1 VIEW COMPARISON:  11/05/2020 FINDINGS: Prior CABG and TAVR. Mild cardiomegaly, vascular congestion. Interstitial prominence throughout the lungs, increasing since prior study, favor interstitial edema. Small left pleural effusion. No pneumothorax or acute bony abnormality. IMPRESSION: Increasing vascular congestion and interstitial prominence, likely interstitial edema. Small left pleural effusion. Electronically Signed   By: Rolm Baptise M.D.   On: 11/06/2020 08:07   DG Chest Port 1 View  Result Date: 11/05/2020 CLINICAL DATA:  Questionable sepsis EXAM: PORTABLE CHEST 1 VIEW COMPARISON:  09/16/2016 FINDINGS: Normal heart size and mediastinal contours. Aortic valve replacement with interval revision. Indistinct density at the bases with small pleural effusion seen laterally on the left. No air bronchogram. No pneumothorax. IMPRESSION: 1. Small left pleural effusion. 2. Hazy density at the bases could be atelectasis or mild infiltrate. Electronically Signed   By: Monte Fantasia M.D.   On: 11/05/2020 07:51   ECHOCARDIOGRAM COMPLETE  Result Date: 11/05/2020    ECHOCARDIOGRAM REPORT   Patient Name:   EMMETT ARNTZ Date of Exam: 11/05/2020 Medical Rec #:  [id number]  Height:       63.0 in Accession #:    [id number]   Weight:       172.4 lb Date of Birth:  [birth year]    BSA:          1.815 m Patient Age:    64 years     BP:           105/77 mmHg Patient Gender: F            HR:           93 bpm. Exam Location:  Inpatient Procedure: 2D Echo, Cardiac Doppler and Color Doppler Indications:    Abnormal EKG  History:        Patient has prior history of Echocardiogram examinations, most                 recent 09/17/2016. Previous Myocardial Infarction, Prior CABG;                 Arrythmias:Atrial  Fibrillation. S/p AVR.                 Aortic Valve: 23 mm CoreValve-Evolut Pro prosthetic, stented                 (TAVR) valve is present in the aortic position. Procedure Date:                 05/01/2020.  Sonographer:    Luisa Hart RDCS Referring Phys: [place]  1. S/p TAVR (23 mm evolout pro). MG slightly higher across prosthesis likely due to Afib with RVR and sepsis. MR is now moderate to severe. TR is now moderate to severe. Would have low threshold for TEE if the patient develops bacteremia with prosthetic  AVR and change in MR/TR. No definitive vegetation seen on this TTE.  2. 23 mm evolute pro (ViV procedure for prior 21 mm Magna ease) in the aortic position. Vmax 2.8 m/s, MG 21 mmHG, EOA 1.18 cm2, DI 0.28. MG slightly higher than what was reported at [facility] (15 mmHG 05/30/2020), likely due to Afib and sepsis. The AT is slightly prolonged ~114 msec. If bacteremia is present would recommend TEE. The aortic valve has been repaired/replaced. Aortic valve regurgitation is not visualized. There is a 23 mm CoreValve-Evolut Pro prosthetic (TAVR) valve present in the aortic position. Procedure Date: 05/01/2020.  3. Left ventricular ejection fraction, by estimation, is 50 to 55%. The left ventricle has low normal function. The left ventricle has no regional wall motion abnormalities. There is mild concentric left ventricular hypertrophy. Left ventricular diastolic function could not be evaluated.  4. Right ventricular systolic function is moderately reduced. The right ventricular size is mildly enlarged. There is moderately elevated pulmonary artery systolic pressure. The estimated right ventricular systolic pressure is 63.8 mmHg.  5. Right atrial size was mildly dilated.  6. The mitral valve is degenerative. Moderate to severe mitral valve regurgitation. No evidence of mitral stenosis.  7. The tricuspid valve is abnormal. Tricuspid valve regurgitation is moderate to severe.  8. The inferior  vena cava is dilated in size with >50% respiratory variability, suggesting right atrial pressure of 8 mmHg. Comparison(s): Changes from prior study are noted. FINDINGS  Left Ventricle: Left ventricular ejection fraction, by estimation, is 50 to 55%. The left ventricle has low normal function. The left ventricle has no regional wall motion abnormalities. The left ventricular internal cavity size was normal in size. There is mild concentric left ventricular hypertrophy.  Left ventricular diastolic function could not be evaluated due to atrial fibrillation. Left ventricular diastolic function could not be evaluated. Right Ventricle: The right ventricular size is mildly enlarged. No increase in right ventricular wall thickness. Right ventricular systolic function is moderately reduced. There is moderately elevated pulmonary artery systolic pressure. The tricuspid regurgitant velocity is 3.27 m/s, and with an assumed right atrial pressure of 8 mmHg, the estimated right ventricular systolic pressure is 61.4 mmHg. Left Atrium: Left atrial size was normal in size. Right Atrium: Right atrial size was mildly dilated. Prominent Crista terminalis. Pericardium: Trivial pericardial effusion is present. Presence of pericardial fat pad. Mitral Valve: The mitral valve is degenerative in appearance. Moderate to severe mitral valve regurgitation. No evidence of mitral valve stenosis. MV peak gradient, 13.4 mmHg. The mean mitral valve gradient is 6.0 mmHg. Tricuspid Valve: The tricuspid valve is abnormal. Tricuspid valve regurgitation is moderate to severe. No evidence of tricuspid stenosis. Aortic Valve: 23 mm evolute pro (ViV procedure for prior 21 mm Magna ease) in the aortic position. Vmax 2.8 m/s, MG 21 mmHG, EOA 1.18 cm2, DI 0.28. MG slightly higher than what was reported at [facility] (15 mmHG 05/30/2020), likely due to Afib and sepsis. The AT is slightly prolonged ~114 msec. If bacteremia is present would recommend TEE. The aortic  valve has been repaired/replaced. Aortic valve regurgitation is not visualized. Aortic valve mean gradient measures 21.1 mmHg. Aortic valve peak gradient measures 32.3 mmHg. Aortic valve area, by VTI measures 1.18 cm. There is a 23 mm CoreValve-Evolut Pro prosthetic, stented (TAVR) valve present in the aortic position. Procedure Date: 05/01/2020. Pulmonic Valve: The pulmonic valve was grossly normal. Pulmonic valve regurgitation is not visualized. No evidence of pulmonic stenosis. Aorta: The aortic root and ascending aorta are structurally normal, with no evidence of dilitation. Venous: The inferior vena cava is dilated in size with greater than 50% respiratory variability, suggesting right atrial pressure of 8 mmHg. IAS/Shunts: The atrial septum is grossly normal.  LEFT VENTRICLE PLAX 2D LVIDd:         3.90 cm LVIDs:         2.90 cm LV PW:         1.30 cm LV IVS:        1.38 cm LVOT diam:     2.30 cm LV SV:         61 LV SV Index:   34 LVOT Area:     4.15 cm  LV Volumes (MOD) LV vol d, MOD A2C: 39.2 ml LV vol d, MOD A4C: 72.5 ml LV vol s, MOD A2C: 20.1 ml LV vol s, MOD A4C: 31.4 ml LV SV MOD A2C:     19.1 ml LV SV MOD A4C:     72.5 ml LV SV MOD BP:      27.4 ml RIGHT VENTRICLE RV S prime:     7.51 cm/s TAPSE (M-mode): 1.1 cm LEFT ATRIUM             Index       RIGHT ATRIUM           Index LA Vol (A2C):   81.2 ml 44.73 ml/m RA Area:     19.80 cm LA Vol (A4C):   58.1 ml 32.00 ml/m RA Volume:   59.40 ml  32.72 ml/m LA Biplane Vol: 68.5 ml 37.73 ml/m  AORTIC VALVE                    PULMONIC VALVE  AV Area (Vmax):    1.22 cm     PV Vmax:       0.80 m/s AV Area (Vmean):   1.20 cm     PV Vmean:      59.400 cm/s AV Area (VTI):     1.18 cm     PV VTI:        0.134 m AV Vmax:           284.16 cm/s  PV Peak grad:  2.6 mmHg AV Vmean:          201.916 cm/s PV Mean grad:  2.0 mmHg AV VTI:            0.517 m AV Peak Grad:      32.3 mmHg AV Mean Grad:      21.1 mmHg LVOT Vmax:         83.31 cm/s LVOT Vmean:        58.377  cm/s LVOT VTI:          0.147 m LVOT/AV VTI ratio: 0.28  AORTA Ao Root diam: 3.50 cm Ao Asc diam:  3.15 cm MITRAL VALVE                 TRICUSPID VALVE MV Peak grad: 13.4 mmHg      TR Peak grad:   42.8 mmHg MV Mean grad: 6.0 mmHg       TR Vmax:        327.00 cm/s MV Vmax:      1.83 m/s MV Vmean:     108.0 cm/s     SHUNTS MR Peak grad:    125.4 mmHg  Systemic VTI:  0.15 m MR Mean grad:    78.0 mmHg   Systemic Diam: 2.30 cm MR Vmax:         560.00 cm/s MR Vmean:        417.0 cm/s MR PISA:         2.26 cm MR PISA Eff ROA: 9 mm MR PISA Radius:  0.60 cm Eleonore Chiquito MD Electronically signed by Eleonore Chiquito MD Signature Date/Time: 11/05/2020/5:22:55 PM    Final    CT RENAL STONE STUDY  Result Date: 11/05/2020 CLINICAL DATA:  86 year old female with history of respiratory failure. Low back pain. EXAM: CT CHEST, ABDOMEN AND PELVIS WITHOUT CONTRAST TECHNIQUE: Multidetector CT imaging of the chest, abdomen and pelvis was performed following the standard protocol without IV contrast. COMPARISON:  Chest CT 03/27/2020. CT the abdomen and pelvis 10/29/2013. FINDINGS: CT CHEST FINDINGS Cardiovascular: Heart size is normal. There is no significant pericardial fluid, thickening or pericardial calcification. There is aortic atherosclerosis, as well as atherosclerosis of the great vessels of the mediastinum and the coronary arteries, including calcified atherosclerotic plaque in the left main, left anterior descending, left circumflex and right coronary arteries. Status post median sternotomy for CABG. Status post TAVR. Mild calcifications of the mitral annulus. Mediastinum/Nodes: No pathologically enlarged mediastinal or hilar lymph nodes. Please note that accurate exclusion of hilar adenopathy is limited on noncontrast CT scans. Esophagus is unremarkable in appearance. No axillary lymphadenopathy. Lungs/Pleura: Status post left lower lobectomy with compensatory hyperexpansion of the left upper lobe. Several small pulmonary  nodules are stable in number and size compared to the prior examination, largest of which is a 7 mm left upper lobe pulmonary nodule (axial image 73 of series 4). No other new suspicious appearing pulmonary nodules or masses are noted. Small bilateral pleural effusions lying dependently with some areas of  passive subsegmental atelectasis in the right lower lobe and dependent portion of the left upper lobe. Musculoskeletal: Median sternotomy wires. There are no aggressive appearing lytic or blastic lesions noted in the visualized portions of the skeleton. CT ABDOMEN PELVIS FINDINGS Hepatobiliary: No definite suspicious cystic or solid hepatic lesions are confidently identified on today's noncontrast CT examination. Status post cholecystectomy. Pancreas: No definite pancreatic mass or peripancreatic fluid collections. Peripancreatic inflammatory changes noted lateral to the head of the pancreas (in close association with the duodenum and porta hepatis). Spleen: Unremarkable. Adrenals/Urinary Tract: No calcifications are identified in the collecting system of either kidney, along the course of either ureter, or within the lumen of the urinary bladder. No hydroureteronephrosis. Multiple renal lesions bilaterally, incompletely characterized on today's non-contrast CT examination, but likely to represent cysts, largest of which is exophytic extending off the upper pole of the right kidney measuring 5.5 cm in diameter. Unenhanced appearance of the urinary bladder is normal. Bilateral adrenal glands are normal in appearance. Stomach/Bowel: Unenhanced appearance of the stomach is normal. Inflammatory changes noted surrounding the duodenum, extending into the adjacent porta hepatis. No pathologic dilatation of small bowel or colon. Numerous colonic diverticulae are noted, without surrounding inflammatory changes to suggest a colonic diverticulitis at this time. Normal appendix. Vascular/Lymphatic: Aortic atherosclerosis. No  lymphadenopathy noted in the abdomen or pelvis. Reproductive: Uterus and ovaries are unremarkable in appearance. Other: Trace volume of ascites adjacent to the liver. No pneumoperitoneum. Musculoskeletal: There are no aggressive appearing lytic or blastic lesions noted in the visualized portions of the skeleton. IMPRESSION: 1. Inflammatory changes surrounding the duodenum, and adjacent to the head of the pancreas, with extension into the region of the porta hepatis. Findings are concerning for probable duodenitis. No inflammatory changes are noted adjacent to the body or tail of the pancreas, but correlation with lipase levels is also recommended. 2. Small bilateral pleural effusions lying dependently with some associated passive subsegmental atelectasis in the dependent portions of the lungs. 3. Trace volume of ascites most evident adjacent to the liver. 4. Colonic diverticulosis without evidence of acute diverticulitis at this time. 5. Aortic atherosclerosis, in addition to left main and 3 vessel coronary artery disease. Status post CABG and TAVR. 6. All previously noted small pulmonary nodules are stable compared to prior examinations dating back to at least 2019, considered benign. 7. Additional incidental findings, as above. Electronically Signed   By: Vinnie Langton M.D.   On: 11/05/2020 16:48   ECHO TEE  Result Date: 11/11/2020    TRANSESOPHOGEAL ECHO REPORT   Patient Name:   Tracy Buck Date of Exam: 11/11/2020 Medical Rec #:  [id number]    Height:       63.0 in Accession #:    [id number]   Weight:       172.4 lb Date of Birth:  29-Jun-1935    BSA:          1.815 m Patient Age:    63 years     BP:           101/59 mmHg Patient Gender: F            HR:           81 bpm. Exam Location:  Inpatient Procedure: Transesophageal Echo, Cardiac Doppler and Color Doppler Indications:     Bacteremia  History:         Patient has prior history of Echocardiogram examinations, most  recent 11/05/2020.  CAD, Aortic Valve Disease, Mitral Valve                  Disease and TAVR, MR< TR, Arrythmias:Atrial Fibrillation,                  Signs/Symptoms:Bacteremia and Sepsis; Risk Factors:Hypertension                  and Dyslipidemia.  Sonographer:     Dustin Flock Referring Phys:  [id number] Tami Lin [facility] Diagnosing Phys: Skeet Latch MD PROCEDURE: The transesophogeal probe was passed without difficulty through the esophogus of the patient. Local oropharyngeal anesthetic was provided with Benzocaine spray. Sedation performed by performing physician. The patient was monitored while under deep sedation. Anesthestetic sedation was provided intravenously by Anesthesiology: 83.24mg  of Propofol. The patient's vital signs; including heart rate, blood pressure, and oxygen saturation; remained stable throughout the procedure. The patient developed no complications during the procedure. IMPRESSIONS  1. Left ventricular ejection fraction, by estimation, is 60 to 65%. The left ventricle has normal function. The left ventricle has no regional wall motion abnormalities.  2. Right ventricular systolic function is normal. The right ventricular size is normal. There is mildly elevated pulmonary artery systolic pressure.  3. No left atrial/left atrial appendage thrombus was detected.  4. Multiple regurgitant jets. No pulmonary vein flow reversal. The mitral valve is normal in structure. Mild to moderate mitral valve regurgitation. No evidence of mitral stenosis.  5. Tricuspid valve regurgitation is severe.  6. The aortic valve has been repaired/replaced. Aortic valve regurgitation is not visualized. No aortic stenosis is present. Aortic valve area, by VTI measures 1.25 cm. Aortic valve mean gradient measures 8.0 mmHg. Aortic valve Vmax measures 2.01 m/s.  7. The inferior vena cava is normal in size with greater than 50% respiratory variability, suggesting right atrial pressure of 3 mmHg.  8. There is a small patent foramen  ovale. Conclusion(s)/Recommendation(s): Normal biventricular function without evidence of hemodynamically significant valvular heart disease. FINDINGS  Left Ventricle: Left ventricular ejection fraction, by estimation, is 60 to 65%. The left ventricle has normal function. The left ventricle has no regional wall motion abnormalities. The left ventricular internal cavity size was normal in size. There is  no left ventricular hypertrophy. Right Ventricle: The right ventricular size is normal. No increase in right ventricular wall thickness. Right ventricular systolic function is normal. There is mildly elevated pulmonary artery systolic pressure. The tricuspid regurgitant velocity is 3.20  m/s, and with an assumed right atrial pressure of 3 mmHg, the estimated right ventricular systolic pressure is 36.6 mmHg. Left Atrium: Left atrial size was normal in size. No left atrial/left atrial appendage thrombus was detected. Right Atrium: Right atrial size was normal in size. Pericardium: There is no evidence of pericardial effusion. Mitral Valve: Multiple regurgitant jets. No pulmonary vein flow reversal. The mitral valve is normal in structure. There is moderate thickening of the mitral valve leaflet(s). Mild to moderate mitral annular calcification. Mild to moderate mitral valve regurgitation. No evidence of mitral valve stenosis. Tricuspid Valve: The tricuspid valve is normal in structure. Tricuspid valve regurgitation is severe. No evidence of tricuspid stenosis. Aortic Valve: The aortic valve has been repaired/replaced. Aortic valve regurgitation is not visualized. No aortic stenosis is present. Aortic valve mean gradient measures 8.0 mmHg. Aortic valve peak gradient measures 16.2 mmHg. Aortic valve area, by VTI  measures 1.25 cm. There is a 23 mm CoreValve-Evolut Pro prosthetic, stented (TAVR) valve present in the aortic  position. Pulmonic Valve: The pulmonic valve was normal in structure. Pulmonic valve regurgitation  is not visualized. No evidence of pulmonic stenosis. Aorta: The aortic root is normal in size and structure. There is minimal (Grade I) atheroma plaque involving the descending aorta. Venous: The inferior vena cava is normal in size with greater than 50% respiratory variability, suggesting right atrial pressure of 3 mmHg. IAS/Shunts: No atrial level shunt detected by color flow Doppler. A small patent foramen ovale is detected.  LEFT VENTRICLE PLAX 2D LVOT diam:     1.90 cm LV SV:         45 LV SV Index:   25 LVOT Area:     2.84 cm  AORTIC VALVE AV Area (Vmax):    1.18 cm AV Area (Vmean):   1.34 cm AV Area (VTI):     1.25 cm AV Vmax:           201.00 cm/s AV Vmean:          131.000 cm/s AV VTI:            0.356 m AV Peak Grad:      16.2 mmHg AV Mean Grad:      8.0 mmHg LVOT Vmax:         83.60 cm/s LVOT Vmean:        61.700 cm/s LVOT VTI:          0.157 m LVOT/AV VTI ratio: 0.44 MR Peak grad:   144.5 mmHg  TRICUSPID VALVE MR Mean grad:   68.0 mmHg   TR Peak grad:   41.0 mmHg MR Vmax:        601.00 cm/s TR Vmax:        320.00 cm/s MR Vmean:       372.0 cm/s MR PISA:        1.57 cm    SHUNTS MR PISA Radius: 0.50 cm     Systemic VTI:  0.16 m                             Systemic Diam: 1.90 cm Skeet Latch MD Electronically signed by Skeet Latch MD Signature Date/Time: 11/11/2020/3:18:10 PM    Final    [place] EKG SITE RITE  Result Date: 11/11/2020 If Site Rite image not attached, placement could not be confirmed due to current cardiac rhythm.  Echocardiogram, TEE.   Subjective: Patient was seen and examined at bedside.  Overnight events noted.  Patient reports feeling much improved.   Patient is being discharged home today,  Patient has a PICC line,  patient will continue to get penicillin through the PICC line.  Discharge Exam: Vitals:   11/17/20 0743 11/17/20 1136  BP: 114/65 117/62  Pulse: 77 88  Resp: (!) 25 (!) 22  Temp:    SpO2: 97% 99%   Vitals:   11/17/20 0327 11/17/20 0400  11/17/20 0743 11/17/20 1136  BP:  123/71 114/65 117/62  Pulse: 78 67 77 88  Resp: 18 (!) 22 (!) 25 (!) 22  Temp: 97.9 F (36.6 C)     TempSrc: Oral     SpO2: 98% 99% 97% 99%  Weight:      Height:        General: Pt is alert, awake, not in acute distress Cardiovascular: RRR, S1/S2 +, no rubs, no gallops Respiratory: CTA bilaterally, no wheezing, no rhonchi Abdominal: Soft, NT, ND, bowel sounds + Extremities: no edema, no cyanosis  The results of significant diagnostics from this hospitalization (including imaging, microbiology, ancillary and laboratory) are listed below for reference.     Microbiology: Recent Results (from the past 240 hour(s))  Culture, blood (routine x 2)     Status: None   Collection Time: 11/09/20 10:16 AM   Specimen: BLOOD LEFT HAND  Result Value Ref Range Status   Specimen Description BLOOD LEFT HAND  Final   Special Requests   Final    BOTTLES DRAWN AEROBIC ONLY Blood Culture results may not be optimal due to an inadequate volume of blood received in culture bottles   Culture   Final    NO GROWTH 5 DAYS Performed at [facility], [place] [address], [place], [place] [address]    Report Status 11/14/2020 FINAL  Final  Culture, blood (routine x 2)     Status: None   Collection Time: 11/09/20 10:24 AM   Specimen: BLOOD RIGHT HAND  Result Value Ref Range Status   Specimen Description BLOOD RIGHT HAND  Final   Special Requests   Final    BOTTLES DRAWN AEROBIC ONLY Blood Culture adequate volume   Culture   Final    NO GROWTH 5 DAYS Performed at [facility], [place] [address], [place], [place] [address]    Report Status 11/14/2020 FINAL  Final  Body fluid culture w Gram Stain     Status: None   Collection Time: 11/11/20  3:49 PM   Specimen: Fluid  Result Value Ref Range Status   Specimen Description FLUID LEFT PLEURAL  Final   Special Requests NONE  Final   Gram Stain   Final    RARE WBC PRESENT, PREDOMINANTLY MONONUCLEAR NO ORGANISMS  SEEN    Culture   Final    NO GROWTH Performed at [facility], [place] [address] [place], [place] [address]    Report Status 11/15/2020 FINAL  Final     Labs: BNP (last 3 results) Recent Labs    11/05/20 0650  BNP 4,696.2*   Basic Metabolic Panel: Recent Labs  Lab 11/11/20 0101 11/13/20 0500 11/14/20 0340 11/15/20 0655 11/16/20 0100 11/17/20 0342  NA 132* 132* 128* 129* 128* 127*  K 3.8 3.0* 3.4* 4.2 3.5 3.2*  CL 104 95* 92* 96* 94* 93*  CO2 _0 GLUCOSE 151* 142* 144* 139* 140* 151*  BUN 7* 5* 7* _1 CREATININE 0.75 0.88 0.95 0.94 0.89 0.78  CALCIUM 8.1* 8.2* 8.4* 8.4* 8.4* 8.5*  MG 2.0  --   --  1.9  --   --   PHOS 3.6  --   --  3.2  --   --    Liver Function Tests: No results for input(s): AST, ALT, ALKPHOS, BILITOT, PROT, ALBUMIN in the last 168 hours. No results for input(s): LIPASE, AMYLASE in the last 168 hours. No results for input(s): AMMONIA in the last 168 hours. CBC: Recent Labs  Lab 11/13/20 0500 11/14/20 0340 11/15/20 0655 11/16/20 0100 11/17/20 0342  WBC 9.8 11.8* 13.0* 14.0* 11.5*  HGB 10.7* 11.6* 11.1* 11.2* 12.0  HCT 31.4* 33.9* 33.3* 34.5* 34.4*  MCV 88.7 86.9 88.1 89.6 86.2  PLT 275 327 336 337 335   Cardiac Enzymes: No results for input(s): CKTOTAL, CKMB, CKMBINDEX, TROPONINI in the last 168 hours. BNP: Invalid input(s): POCBNP CBG: No results for input(s): GLUCAP in the last 168 hours. D-Dimer No results for input(s): DDIMER in the last 72  hours. Hgb A1c No results for input(s): HGBA1C in the last 72 hours. Lipid Profile No results for input(s): CHOL, HDL, LDLCALC, TRIG, CHOLHDL, LDLDIRECT in the last 72 hours. Thyroid function studies No results for input(s): TSH, T4TOTAL, T3FREE, THYROIDAB in the last 72 hours.  Invalid input(s): FREET3 Anemia work up No results for input(s): VITAMINB12, FOLATE, FERRITIN, TIBC, IRON, RETICCTPCT in the last 72 hours. Urinalysis    Component Value Date/Time    COLORURINE YELLOW 11/17/2020 1213   APPEARANCEUR HAZY (A) 11/17/2020 1213   LABSPEC 1.013 11/17/2020 1213   PHURINE 6.0 11/17/2020 1213   GLUCOSEU NEGATIVE 11/17/2020 1213   HGBUR LARGE (A) 11/17/2020 1213   BILIRUBINUR NEGATIVE 11/17/2020 1213   KETONESUR NEGATIVE 11/17/2020 1213   PROTEINUR NEGATIVE 11/17/2020 1213   NITRITE NEGATIVE 11/17/2020 1213   LEUKOCYTESUR LARGE (A) 11/17/2020 1213   Sepsis Labs Invalid input(s): PROCALCITONIN,  WBC,  LACTICIDVEN Microbiology Recent Results (from the past 240 hour(s))  Culture, blood (routine x 2)     Status: None   Collection Time: 11/09/20 10:16 AM   Specimen: BLOOD LEFT HAND  Result Value Ref Range Status   Specimen Description BLOOD LEFT HAND  Final   Special Requests   Final    BOTTLES DRAWN AEROBIC ONLY Blood Culture results may not be optimal due to an inadequate volume of blood received in culture bottles   Culture   Final    NO GROWTH 5 DAYS Performed at [facility], [place] [address], [place], [place] [address]    Report Status 11/14/2020 FINAL  Final  Culture, blood (routine x 2)     Status: None   Collection Time: 11/09/20 10:24 AM   Specimen: BLOOD RIGHT HAND  Result Value Ref Range Status   Specimen Description BLOOD RIGHT HAND  Final   Special Requests   Final    BOTTLES DRAWN AEROBIC ONLY Blood Culture adequate volume   Culture   Final    NO GROWTH 5 DAYS Performed at [facility], [place] [address] [place], [place] [address]    Report Status 11/14/2020 FINAL  Final  Body fluid culture w Gram Stain     Status: None   Collection Time: 11/11/20  3:49 PM   Specimen: Fluid  Result Value Ref Range Status   Specimen Description FLUID LEFT PLEURAL  Final   Special Requests NONE  Final   Gram Stain   Final    RARE WBC PRESENT, PREDOMINANTLY MONONUCLEAR NO ORGANISMS SEEN    Culture   Final    NO GROWTH Performed at [facility], [place] [address], [place], [place] [address]    Report Status 11/15/2020  FINAL  Final     Time coordinating discharge: Over 30 minutes  SIGNED:   Shawna Clamp, MD  [facility] 11/17/2020, 1:39 PM Pager   If 7PM-7AM, please contact night-coverage [email or web address]

## 2020-11-17 NOTE — Progress Notes (Signed)
Progress Note  Patient Name: Tracy Buck Date of Encounter: 11/17/2020  Primary Cardiologist: Bishop Limbo MD [facility]  Subjective   No SOB, No CP, no palpitations.  Notes new vaginal burning with urination.  Inpatient Medications    Scheduled Meds: . (feeding supplement) PROSource Plus  30 mL Oral BID BM  . apixaban  5 mg Oral BID  . Chlorhexidine Gluconate Cloth  6 each Topical Daily  . diltiazem  120 mg Oral Daily  . feeding supplement  237 mL Oral TID BM  . fluticasone furoate-vilanterol  1 puff Inhalation Daily  . furosemide  40 mg Oral Daily  . loratadine  10 mg Oral Daily  . melatonin  3 mg Oral QHS  . multivitamin with minerals  1 tablet Oral Daily  . nystatin  5 mL Oral QID  . rosuvastatin  20 mg Oral Daily  . sodium chloride flush  10-40 mL Intracatheter Q12H  . sodium chloride  1 g Oral BID WC  . umeclidinium bromide  1 puff Inhalation Daily   Continuous Infusions: . sodium chloride Stopped (11/05/20 0847)  . penicillin g continuous IV infusion 12 Million Units (11/16/20 2231)   PRN Meds: sodium chloride, acetaminophen, diphenhydrAMINE, docusate sodium, fentaNYL (SUBLIMAZE) injection, haloperidol lactate, ipratropium-albuterol, LORazepam, oxymetazoline, polyethylene glycol, sodium chloride flush   Vital Signs    Vitals:   11/17/20 0000 11/17/20 0327 11/17/20 0400 11/17/20 0743  BP: 112/70  123/71 114/65  Pulse: 80 78 67 77  Resp: (!) 23 18 (!) 22 (!) 25  Temp:  97.9 F (36.6 C)    TempSrc:  Oral    SpO2: 95% 98% 99% 97%  Weight:      Height:        Intake/Output Summary (Last 24 hours) at 11/17/2020 1013 Last data filed at 11/17/2020 0600 Gross per 24 hour  Intake 918.1 ml  Output 1200 ml  Net -281.9 ml   Filed Weights   11/07/20 0500 11/14/20 0500 11/16/20 0402  Weight: 78.2 kg 73.8 kg 71.4 kg    Telemetry    AF rate controlled; with occasional PVCs - Personally Reviewed  ECG    No new - Personally Reviewed  Physical Exam   GEN: Mild  distress secondary to pain, elderly female Neck: No JVD Cardiac: Irregularly irregular, II/VI holosystolic murmur ,no rubs, or gallops.  Respiratory: Clear to auscultation bilaterally. GI: Soft, nontender, non-distended  MS: No edema; No deformity. Neuro:  Nonfocal  Psych: Normal affect   Labs    Chemistry Recent Labs  Lab 11/15/20 0655 11/16/20 0100 11/17/20 0342  NA 129* 128* 127*  K 4.2 3.5 3.2*  CL 96* 94* 93*  CO2 26 24 26   GLUCOSE 139* 140* 151*  BUN 9 12 10   CREATININE 0.94 0.89 0.78  CALCIUM 8.4* 8.4* 8.5*  GFRNONAA >60 >60 >60  ANIONGAP 7 10 8      Hematology Recent Labs  Lab 11/15/20 0655 11/16/20 0100 11/17/20 0342  WBC 13.0* 14.0* 11.5*  RBC 3.78* 3.85* 3.99  HGB 11.1* 11.2* 12.0  HCT 33.3* 34.5* 34.4*  MCV 88.1 89.6 86.2  MCH 29.4 29.1 30.1  MCHC 33.3 32.5 34.9  RDW 14.1 13.9 13.6  PLT 336 337 335    Cardiac EnzymesNo results for input(s): TROPONINI in the last 168 hours. No results for input(s): TROPIPOC in the last 168 hours.   BNPNo results for input(s): BNP, PROBNP in the last 168 hours.   DDimer No results for input(s): DDIMER in the last  168 hours.   Radiology    No results found.  Cardiac Studies   1. Left ventricular ejection fraction, by estimation, is 60 to 65%. The  left ventricle has normal function. The left ventricle has no regional  wall motion abnormalities.  2. Right ventricular systolic function is normal. The right ventricular  size is normal. There is mildly elevated pulmonary artery systolic  pressure.  3. No left atrial/left atrial appendage thrombus was detected.  4. Multiple regurgitant jets. No pulmonary vein flow reversal. The mitral  valve is normal in structure. Mild to moderate mitral valve regurgitation.  No evidence of mitral stenosis.  5. Tricuspid valve regurgitation is severe.  6. The aortic valve has been repaired/replaced. Aortic valve  regurgitation is not visualized. No aortic stenosis is  present. Aortic  valve area, by VTI measures 1.25 cm. Aortic valve mean gradient measures  8.0 mmHg. Aortic valve Vmax measures 2.01 m/s.  7. The inferior vena cava is normal in size with greater than 50%  respiratory variability, suggesting right atrial pressure of 3 mmHg.  8. There is a small patent foramen ovale.   Patient Profile     86 y.o. 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  TEE March 2022 showed normal LV function, mild to moderate mitral regurgitation, severe tricuspid regurgitation, previous aortic valve replacement with no aortic stenosis and mean gradient 8 mmHg.  Assessment & Plan    Persistent atrial fibrillation - rate controlled - continue diltiazem - stop home ASA and continue eliquis - can consider outpatient DCCV  Mild to moderate tricuspid regurgitation Severe Tricuspid regurgitation without typical carcinoid appearance (but with history of malignant carcinoid) History of severe AS s/p TAVR HTN - stop home thiazide - would add lisinopril back only after outpatient evaluation - continue 40 mg PO Lasix daily - outpatient nephrology follow up  Nonobstructive CAD with NSTEMI Sepsis - continue rosuvastatin - no new WMA; thought to be demand related in the setting of sepsis - can consider outpatient ischemic evaluation  [facility] will sign off.   Medication Recommendations:  As above Other recommendations (labs, testing, etc):  BMP 11/20/20 Follow up as an outpatient:  Called [facility]; has outpatient f/u 11/20/20 At 10:30 AM  Discussed with patient and primary MD  For questions or updates, please contact [place] Please consult [email or web address] for contact info under Cardiology/STEMI.      Signed, Werner Lean, MD   11/17/2020, 10:13 AM

## 2020-11-17 NOTE — Care Management Important Message (Signed)
Important Message  Patient Details  Name: Tracy Buck MRN: [id number] Date of Birth: 07-27-1935   Medicare Important Message Given:  Yes Patient left prior to IM delivery.  IM mailed to the patient home address.     Magalie Almon 11/17/2020, 4:19 PM

## 2020-11-17 NOTE — TOC Transition Note (Signed)
Transition of Care [facility]) - CM/SW Discharge Note   Patient Details  Name: Lashena Signer MRN: [id number] Date of Birth: 15-Mar-1935  [facility] CM/SW Contact:  Joanne Chars, LCSW Phone Number: 11/17/2020, 11:18 AM   Clinical Narrative:  Pt discharging home with [facility] for IV abx.  No equipment needs.       Final next level of care: Home w Home Health Services Barriers to Discharge: Barriers Resolved   Patient Goals and CMS Choice   CMS Medicare.gov Compare Post Acute Care list provided to:: Patient Represenative (must comment) Choice offered to / list presented to : Adult Children  Discharge Placement                       Discharge Plan and Services In-house Referral: Clinical Social Work   Post Acute Care Choice: Home Health          DME Arranged: N/A         HH Arranged: RN [place] Agency: [place] Date [facility] Agency Contacted: 11/07/20 Time [place]: [id number] Representative spoke with at [place]: [place] Determinants of Health ([place]) Interventions     Readmission Risk Interventions Readmission Risk Prevention Plan 11/17/2020  Transportation Screening Complete  Home Care Screening Complete  Some recent data might be hidden

## 2020-11-17 NOTE — TOC Progression Note (Signed)
Transition of Care [facility]) - Progression Note    Patient Details  Name: Tracy Buck MRN: [id number] Date of Birth: 1934-10-26  Transition of Care [facility]) CM/SW Contact  Joanne Chars, LCSW Phone Number: 11/17/2020, 9:42 AM  Clinical Narrative:   CSW attempted to make hospital discharge appt: phone lines down for Dara Lords to make appt.     Expected Discharge Plan: [place] Barriers to Discharge: Continued Medical Work up  Expected Discharge Plan and Services Expected Discharge Plan: [place] In-house Referral: Clinical Social Work   Post Acute Care Choice: [place] arrangements for the past 2 months: [place]: PT [place]: [place] Date [place]: 11/07/20 Time [place]: [phone] Representative spoke with at [place]: [place] ([place]) Interventions    Readmission Risk Interventions Readmission Risk Prevention Plan 11/17/2020  Transportation Screening Complete  Home Care Screening Complete  Some recent data might be hidden

## 2020-11-17 NOTE — Progress Notes (Signed)
Nutrition Follow-up  DOCUMENTATION CODES:  Non-severe (moderate) malnutrition in context of acute illness/injury  INTERVENTION:  -Advance diet to soft as previously approved by MD (approved by Dr.Kumar, see note from 3/24) -d/c Ensure -Boost Breeze po TID, each supplement provides 250 kcal and 9 grams of protein -Continue Magic cup TID with meals, each supplement provides 290 kcal and 9 grams of protein -Continue 31ml Prosource Plus po BID, each supplement provides 100 kcals and 15 grams of protein -Continue MVI with minerals daily  NUTRITION DIAGNOSIS:  Moderate Malnutrition related to acute illness (sepsis 2/2 duodenitis) as evidenced by mild fat depletion,mild muscle depletion,energy intake < 75% for > 7 days. -- ongoing  GOAL:  Patient will meet greater than or equal to 90% of their needs -- progressing  MONITOR:  PO intake,Supplement acceptance,Diet advancement,Weight trends  REASON FOR ASSESSMENT:  Malnutrition Screening Tool   ASSESSMENT:  86 year old female who presented to the ED on 3/16 with SOB. PMH of COPD, HTN, HLD, bioprosthetic aortic valve (2021), CAD, and lung cancer s/p left lower lobectomy. Pt admitted with concerns of sepsis secondary to duodenitis and requiring BiPAP for hypoxia.  3/16 - admit to ICU with working diagnosis sepsis 3/17 - tx to floor; diet advanced to clears 3/18 - diet advanced to heart healthy 3/20 - ID consult for Actinomyces 3/22 - PCCM reconsulted for worsening hypoxia, TEE, thoracentesis with removal of 600cc on L, diuresis to address R effusion; diet advanced to clear liquids   Discussed pt with RN.   Pt c/o pain 2/2 new UTI and was having difficulty answering RD questions for this reason. Was able to determine that pt has not been eating well after discussions w/ RN and observing pt's breakfast tray consisting only of cream of wheat. Pt stated she is growing tired of her limited meal options. Discussed further advancement of diet to soft  as per discussion with MD on 3/24. Pt agreeable to advancement of diet and understands she can still order from the full liquid menu as necessary. Encouraged pt to consume what she could from each meal.  Pt currently with orders for Ensure and Magic Cup TID in addition to Prosource Plus BID. Pt reports that she has not been enjoying the Ensure supplements (prefers Boost products) and would like to switch back to [employer]. Pt reports that she enjoys the [employer] and is agreeable to continuing the Prosource.  Per pt, she is going to discharge home w/ home health services today (also noted in Case Management note). Discussed strategies for having adequate intake at home.  Admission weight: 76.2 kg Current weight: 71.4kg Note, pt was admitted with mild pitting edema and is now noted to have no edema per RN assessments.   UOP: 1238ml x24 hours  Medications: lasix, mvi with minerals, sodium chloride Labs: Na 127 (L, trending down), K+ 3.2 (L, trending down)  Diet Order:   Diet Order            Diet - low sodium heart healthy           Diet Carb Modified           Diet full liquid Room service appropriate? Yes with Assist; Fluid consistency: Thin  Diet effective now                EDUCATION NEEDS:  No education needs have been identified at this time  Skin:  Skin Assessment: Reviewed RN Assessment  Last BM:  3/25  Height:  Ht Readings  from Last 1 Encounters:  11/05/20 5\' 3"  (1.6 m)  Weight:  Wt Readings from Last 1 Encounters:  11/16/20 71.4 kg  BMI:  Body mass index is 27.88 kg/m.  Estimated Nutritional Needs:  Kcal:  1600-1800 Protein:  80-95 grams Fluid:  1.6-1.8 L  Larkin Ina, MS, RD, LDN RD pager number and weekend/on-call pager number located in [place].

## 2020-11-19 ENCOUNTER — Emergency Department (HOSPITAL_BASED_OUTPATIENT_CLINIC_OR_DEPARTMENT_OTHER)
Admission: EM | Admit: 2020-11-19 | Discharge: 2020-11-19 | Disposition: A | Discharge status: Home / Self Care | Payer: Medicare Other | Attending: Emergency Medicine | Admitting: Emergency Medicine

## 2020-11-19 ENCOUNTER — Encounter (HOSPITAL_BASED_OUTPATIENT_CLINIC_OR_DEPARTMENT_OTHER): Payer: Self-pay

## 2020-11-19 ENCOUNTER — Other Ambulatory Visit: Payer: Self-pay

## 2020-11-19 DIAGNOSIS — E876 Hypokalemia: Secondary | ICD-10-CM | POA: Diagnosis not present

## 2020-11-19 DIAGNOSIS — I10 Essential (primary) hypertension: Secondary | ICD-10-CM | POA: Diagnosis not present

## 2020-11-19 DIAGNOSIS — J449 Chronic obstructive pulmonary disease, unspecified: Secondary | ICD-10-CM | POA: Insufficient documentation

## 2020-11-19 DIAGNOSIS — R011 Cardiac murmur, unspecified: Secondary | ICD-10-CM | POA: Insufficient documentation

## 2020-11-19 DIAGNOSIS — Z79899 Other long term (current) drug therapy: Secondary | ICD-10-CM | POA: Insufficient documentation

## 2020-11-19 DIAGNOSIS — Z951 Presence of aortocoronary bypass graft: Secondary | ICD-10-CM | POA: Diagnosis not present

## 2020-11-19 DIAGNOSIS — Z85118 Personal history of other malignant neoplasm of bronchus and lung: Secondary | ICD-10-CM | POA: Diagnosis not present

## 2020-11-19 DIAGNOSIS — Z7952 Long term (current) use of systemic steroids: Secondary | ICD-10-CM | POA: Insufficient documentation

## 2020-11-19 DIAGNOSIS — Z85828 Personal history of other malignant neoplasm of skin: Secondary | ICD-10-CM | POA: Insufficient documentation

## 2020-11-19 DIAGNOSIS — Z7901 Long term (current) use of anticoagulants: Secondary | ICD-10-CM | POA: Insufficient documentation

## 2020-11-19 LAB — BASIC METABOLIC PANEL WITH GFR
Anion gap: 12 (ref 5–15)
BUN: 14 mg/dL (ref 8–23)
CO2: 23 mmol/L (ref 22–32)
Calcium: 7.9 mg/dL — ABNORMAL LOW (ref 8.9–10.3)
Chloride: 92 mmol/L — ABNORMAL LOW (ref 98–111)
Creatinine, Ser: 0.7 mg/dL (ref 0.44–1.00)
GFR, Estimated: >60 mL/min
Glucose, Bld: 121 mg/dL — ABNORMAL HIGH (ref 70–99)
Potassium: 2.6 mmol/L — CL (ref 3.5–5.1)
Sodium: 127 mmol/L — ABNORMAL LOW (ref 135–145)

## 2020-11-19 LAB — MAGNESIUM: Magnesium: 1.9 mg/dL (ref 1.7–2.4)

## 2020-11-19 MED ORDER — POTASSIUM CHLORIDE CRYS ER 20 MEQ PO TBCR
40.0000 meq | EXTENDED_RELEASE_TABLET | Freq: Once | ORAL | Status: AC
  Administered 2020-11-19: 40 meq via ORAL
  Filled 2020-11-19: qty 2

## 2020-11-19 MED ORDER — SODIUM CHLORIDE 0.9 % IV SOLN
INTRAVENOUS | Status: DC | PRN
  Administered 2020-11-19: 500 mL via INTRAVENOUS

## 2020-11-19 MED ORDER — MAGNESIUM SULFATE 50 % IJ SOLN: 1.0000 g | Freq: Once | INTRAVENOUS | Status: DC

## 2020-11-19 MED ORDER — MAGNESIUM SULFATE 50 % IJ SOLN
1.0000 g | Freq: Once | INTRAMUSCULAR | Status: DC
  Filled 2020-11-19: qty 2

## 2020-11-19 MED ORDER — POTASSIUM CHLORIDE 10 MEQ/100ML IV SOLN
10.0000 meq | Freq: Once | INTRAVENOUS | Status: AC
  Administered 2020-11-19: 10 meq via INTRAVENOUS
  Filled 2020-11-19: qty 100

## 2020-11-19 MED ORDER — POTASSIUM CHLORIDE ER 20 MEQ PO TBCR: 40.0000 meq | EXTENDED_RELEASE_TABLET | Freq: Four times a day (QID) | ORAL | 0 refills | Status: DC

## 2020-11-19 MED ORDER — MAGNESIUM SULFATE 2 GM/50ML IV SOLN
2.0000 g | Freq: Once | INTRAVENOUS | Status: AC
  Administered 2020-11-19: 2 g via INTRAVENOUS
  Filled 2020-11-19: qty 50

## 2020-11-19 NOTE — ED Notes (Signed)
EKG done and given to EDP

## 2020-11-19 NOTE — ED Provider Notes (Signed)
[facility] EMERGENCY DEPARTMENT Provider Note   CSN: [id number] Arrival date & time: 11/19/20  1634     History Chief Complaint  Patient presents with  . Hypokalemia    Tracy Buck is a 86 y.o. female.  Tracy Buck presents upon referral by her primary care doctor.  She had her potassium checked today, and it was quite low.  She has recently been discharged from the hospital after being admitted for sepsis.  She had a complicated hospital course, and she was discharged on IV antibiotics.  She was actually feeling quite well today, and she has no new complaints.  She is on Lasix, and she states that she is having quite a bit of urine output.  She denies any vomiting or diarrhea.   The history is provided by the patient and a relative (daughter).       Past Medical History:  Diagnosis Date  . Arthritis    "fingers, knees" (09/16/2016)  . Complication of anesthesia    "I often go into severe panic attacks when they try to bring me out and they have to put me back under; sometimes happens when they are starting the anesthesia also" (09/16/2016)  . COPD (chronic obstructive pulmonary disease) ([place])    "I think this has been recently dx'd" (09/16/2016)  . Heart valve replaced   . Hyperlipidemia   . Hypertension   . LBBB (left bundle branch block)   . Lung cancer ([place]) 2013  . Lung mass   . Myocardial infarct ([place])   . Panic attack   . Pneumonia    "when I was very young" (09/16/2016)  . Rib fracture   . Skin cancer of nose     Patient Active Problem List   Diagnosis Date Noted  . Persistent atrial fibrillation ([place])   . S/P PICC central line placement   . Sepsis with acute renal failure without septic shock ([place])   . 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 Fatigue   . Bacteremia   . Acute renal failure ([place])   . Malnutrition of moderate degree 11/07/2020  . Acute respiratory failure with hypoxia ([place]) 11/05/2020  . Lactic acidosis   . Elevated troponin   .  Atrial fibrillation with RVR ([place])   . Severe sepsis with septic shock ([place])   . Cholecystitis 09/16/2016  . S/P AVR (aortic valve replacement) 09/16/2016  . S/P CABG (coronary artery bypass graft) 09/16/2016  . Essential hypertension 09/16/2016  . COPD (chronic obstructive pulmonary disease) ([place]) 09/16/2016  . Hyperglycemia 09/16/2016  . Pre-op evaluation   . Hyperlipidemia     Past Surgical History:  Procedure Laterality Date  . AORTIC VALVE REPLACEMENT     "bovine valve"  . BYPASS GRAFT     does not know if she had CABG (09/16/2016)  . CARDIAC SURGERY     does not know if she had CABG (09/16/2016)  . CARDIAC VALVE REPLACEMENT    . CARPAL TUNNEL RELEASE Right 1959  . CHOLECYSTECTOMY N/A 09/17/2016   Procedure: LAPAROSCOPIC CHOLECYSTECTOMY;  Surgeon: Ralene Ok, MD;  Location: [facility];  Service: General;  Laterality: N/A;  . [place]  . KNEE ARTHROSCOPY Left 09/2014  . LOBECTOMY Left 2013   "took bottom lobe off"  . SKIN CANCER EXCISION     "tip of my nose"  . TEE WITHOUT CARDIOVERSION N/A 11/11/2020   Procedure: TRANSESOPHAGEAL ECHOCARDIOGRAM (TEE);  Surgeon: Skeet Latch, MD;  Location: [facility];  Service: Cardiovascular;  Laterality: N/A;  . TONSILLECTOMY       OB History   No obstetric history on file.     Family History  Problem Relation Age of Onset  . Transient ischemic attack Mother   . Colon cancer Father     Social History   Tobacco Use  . Smoking status: Never Smoker  . Smokeless tobacco: Never Used  Substance Use Topics  . Alcohol use: Yes    Comment: 09/16/2016 "2-3 glasses of wine when at the beach; once/year"  . Drug use: No    Home Medications Prior to Admission medications   Medication Sig Start Date End Date Taking? Authorizing Provider  acetaminophen (TYLENOL) 500 MG tablet Take 500 mg by mouth every 6 (six) hours as needed. For pain.    [provider]  albuterol (PROVENTIL HFA;VENTOLIN  HFA) 108 (90 BASE) MCG/ACT inhaler Inhale 2 puffs into the lungs every 6 (six) hours as needed. Patient uses 2 puffs of this medication at 6 am.    [provider]  albuterol (PROVENTIL) (2.5 MG/3ML) 0.083% nebulizer solution Take 2.5 mg by nebulization every 6 (six) hours as needed.    [provider]  apixaban (ELIQUIS) 5 MG TABS tablet Take 1 tablet (5 mg total) by mouth 2 (two) times daily. 11/17/20   Shawna Clamp, MD  calcium carbonate (TITRALAC) 420 MG CHEW Chew 420 mg by mouth daily as needed for heartburn.    [provider]  cephALEXin (KEFLEX) 500 MG capsule Take 1 capsule (500 mg total) by mouth 2 (two) times daily for 10 days. 11/17/20 11/27/20  Shawna Clamp, MD  diltiazem (CARDIZEM CD) 120 MG 24 hr capsule Take 1 capsule (120 mg total) by mouth daily. 11/18/20   Shawna Clamp, MD  docusate sodium (COLACE) 100 MG capsule Take 100 mg by mouth 2 (two) times daily.    [provider]  Fluticasone-Salmeterol (ADVAIR) 250-50 MCG/DOSE AEPB Inhale 1 puff into the lungs every 12 (twelve) hours.    [provider]  furosemide (LASIX) 40 MG tablet Take 1 tablet (40 mg total) by mouth daily. 11/18/20   Shawna Clamp, MD  loratadine (CLARITIN) 10 MG tablet Take 10 mg by mouth daily.    [provider]  penicillin G IVPB Inject 24 Million Units into the vein daily for 25 days. As a continuous infusion. Indication:  Actinomyces Bacteremia  First Dose: No Last Day of Therapy:  12/07/2020 Labs - Once weekly:  CBC/D and BMP, Labs - Every other week:  ESR and CRP Method of administration: Elastomeric (Continuous infusion) Method of administration may be changed at the discretion of home infusion pharmacist based upon assessment of the patient and/or caregiver's ability to self-administer the medication ordered. 11/14/20 12/09/20  Truman Hayward, MD  rosuvastatin (CRESTOR) 20 MG tablet Take 20 mg by mouth daily.    [provider]  sodium  chloride 1 g tablet Take 1 tablet (1 g total) by mouth 2 (two) times daily with a meal. 11/17/20   Shawna Clamp, MD  tiotropium (SPIRIVA) 18 MCG inhalation capsule Place 18 mcg into inhaler and inhale daily.    [provider]  zolpidem (AMBIEN) 10 MG tablet Take 5 mg by mouth at bedtime as needed for sleep.    [provider]    Allergies    Fig extract [ficus], Citalopram hydrobromide, Sulfa antibiotics, and Prednisone  Review of Systems   Review of Systems  Constitutional: Negative for chills and fever.  HENT:  Negative for ear pain and sore throat.   Eyes: Negative for pain and visual disturbance.  Respiratory: Negative for cough and shortness of breath.   Cardiovascular: Negative for chest pain and palpitations.  Gastrointestinal: Negative for abdominal pain and vomiting.  Genitourinary: Negative for dysuria and hematuria.  Musculoskeletal: Negative for arthralgias and back pain.  Skin: Negative for color change and rash.  Neurological: Negative for seizures and syncope.  All other systems reviewed and are negative.   Physical Exam Updated Vital Signs BP 128/76 (BP Location: Left Arm)   Pulse 96   Temp 98.2 F (36.8 C) (Oral)   Resp 18   SpO2 98%   Physical Exam Vitals and nursing note reviewed.  Constitutional:      General: She is not in acute distress.    Appearance: She is well-developed.  HENT:     Head: Normocephalic and atraumatic.  Eyes:     Conjunctiva/sclera: Conjunctivae normal.  Cardiovascular:     Rate and Rhythm: Normal rate. Rhythm irregular.     Heart sounds: Murmur heard.      Comments: Systolic murmur 3/6 Pulmonary:     Effort: Pulmonary effort is normal. No respiratory distress.     Breath sounds: Normal breath sounds.  Abdominal:     Palpations: Abdomen is soft.     Tenderness: There is no abdominal tenderness.  Musculoskeletal:     Cervical back: Neck supple.  Skin:    General: Skin is warm and dry.  Neurological:      General: No focal deficit present.     Mental Status: She is alert.  Psychiatric:        Mood and Affect: Mood normal.     ED Results / Procedures / Treatments   Labs (all labs ordered are listed, but only abnormal results are displayed) Labs Reviewed  BASIC METABOLIC PANEL - Abnormal; Notable for the following components:      Result Value   Sodium 127 (*)    Potassium 2.6 (*)    Chloride 92 (*)    Glucose, Bld 121 (*)    Calcium 7.9 (*)    All other components within normal limits  MAGNESIUM    EKG EKG Interpretation  Date/Time:  Wednesday November 19 2020 20:30:12 EDT Ventricular Rate:  84 PR Interval:    QRS Duration: 148 QT Interval:  444 QTC Calculation: 525 R Axis:   103 Text Interpretation: Atrial fibrillation Multiple ventricular premature complexes Consider left ventricular hypertrophy Repol abnrm, probable ischemia, lateral leads Prolonged QT interval has decreased from earlier today Confirmed by Lorre Munroe ([id number]) on 11/19/2020 8:37:33 PM   Radiology No results found.  Procedures Procedures   Medications Ordered in ED Medications  potassium chloride 10 mEq in 100 mL IVPB (has no administration in time range)  magnesium sulfate (IV Push/IM) injection 1 g (has no administration in time range)    ED Course  I have reviewed the triage vital signs and the nursing notes.  Pertinent labs & imaging results that were available during my care of the patient were reviewed by me and considered in my medical decision making (see chart for details).    MDM Rules/Calculators/A&P                          Tracy Buck presents upon referral from her PCP for hypokalemia.  She is on Lasix, and this is the most likely source of her hypokalemia.  She was given  IV potassium, oral potassium, and IV magnesium.  She will be discharged home on potassium.  QTc interval was slightly prolonged on her EKG but was normalizing after treatment.  Because she is able to tolerate orals  and she is not having gastrointestinal losses, I think she can be managed as an outpatient.  Furthermore, she has very close follow-up with her primary care provider.  I have stressed the importance of having her labs checked in no longer than 2 days from now.  She is agreeable with this plan and will be discharged home. Final Clinical Impression(s) / ED Diagnoses Final diagnoses:  Hypokalemia due to excessive renal loss of potassium    Rx / DC Orders ED Discharge Orders         Ordered    potassium chloride 20 MEQ TBCR  4 times daily        11/19/20 1834           Arnaldo Natal, MD 11/19/20 2039

## 2020-11-19 NOTE — ED Triage Notes (Signed)
Per pt and daughter pt had post hosp admn appt with PCP today-had blood work-was called and advised low K+ and to go to ED-pt NAD-to triage in w/c

## 2020-11-20 NOTE — Telephone Encounter (Signed)
Patient d/c - called pt who asked me to speak to her daughter and scheduled a hospital f/u with Tammy Parrett,NP due to Dr. Erin Fulling not having an availability -pr

## 2020-11-24 ENCOUNTER — Encounter: Payer: Self-pay | Admitting: Infectious Disease

## 2020-11-28 ENCOUNTER — Other Ambulatory Visit: Payer: Self-pay

## 2020-11-28 ENCOUNTER — Ambulatory Visit (INDEPENDENT_AMBULATORY_CARE_PROVIDER_SITE_OTHER): Payer: Medicare Other | Admitting: Infectious Disease

## 2020-11-28 ENCOUNTER — Encounter: Payer: Self-pay | Admitting: Infectious Disease

## 2020-11-28 VITALS — BP 110/67 | HR 53 | Temp 98.1°F

## 2020-11-28 DIAGNOSIS — R7881 Bacteremia: Secondary | ICD-10-CM | POA: Diagnosis not present

## 2020-11-28 DIAGNOSIS — Z95828 Presence of other vascular implants and grafts: Secondary | ICD-10-CM

## 2020-11-28 DIAGNOSIS — Z952 Presence of prosthetic heart valve: Secondary | ICD-10-CM

## 2020-11-28 DIAGNOSIS — A429 Actinomycosis, unspecified: Secondary | ICD-10-CM | POA: Diagnosis present

## 2020-11-28 DIAGNOSIS — K2980 Duodenitis without bleeding: Secondary | ICD-10-CM

## 2020-11-28 DIAGNOSIS — R300 Dysuria: Secondary | ICD-10-CM

## 2020-11-28 MED ORDER — PENICILLIN V POTASSIUM 500 MG PO TABS: 500.0000 mg | ORAL_TABLET | Freq: Four times a day (QID) | ORAL | 5 refills | Status: DC

## 2020-11-28 NOTE — Progress Notes (Signed)
Subjective:  Chief complaint follow-up for actinomyces infection   Patient ID: Tracy Buck, female    DOB: 11-07-1934, 86 y.o.   MRN: [id number]  HPI     Past Medical History:  Diagnosis Date  . Arthritis    "fingers, knees" (09/16/2016)  . Complication of anesthesia    "I often go into severe panic attacks when they try to bring me out and they have to put me back under; sometimes happens when they are starting the anesthesia also" (09/16/2016)  . COPD (chronic obstructive pulmonary disease) ([place])    "I think this has been recently dx'd" (09/16/2016)  . Heart valve replaced   . Hyperlipidemia   . Hypertension   . LBBB (left bundle branch block)   . Lung cancer ([place]) 2013  . Lung mass   . Myocardial infarct ([place])   . Panic attack   . Pneumonia    "when I was very young" (09/16/2016)  . Rib fracture   . Skin cancer of nose     Past Surgical History:  Procedure Laterality Date  . AORTIC VALVE REPLACEMENT     "bovine valve"  . BYPASS GRAFT     does not know if she had CABG (09/16/2016)  . CARDIAC SURGERY     does not know if she had CABG (09/16/2016)  . CARDIAC VALVE REPLACEMENT    . CARPAL TUNNEL RELEASE Right 1959  . CHOLECYSTECTOMY N/A 09/17/2016   Procedure: LAPAROSCOPIC CHOLECYSTECTOMY;  Surgeon: Ralene Ok, MD;  Location: [facility];  Service: General;  Laterality: N/A;  . [place]  . KNEE ARTHROSCOPY Left 09/2014  . LOBECTOMY Left 2013   "took bottom lobe off"  . SKIN CANCER EXCISION     "tip of my nose"  . TEE WITHOUT CARDIOVERSION N/A 11/11/2020   Procedure: TRANSESOPHAGEAL ECHOCARDIOGRAM (TEE);  Surgeon: Skeet Latch, MD;  Location: [facility] ENDOSCOPY;  Service: Cardiovascular;  Laterality: N/A;  . TONSILLECTOMY      Family History  Problem Relation Age of Onset  . Transient ischemic attack Mother   . Colon cancer Father       Social History   Socioeconomic History  . Marital status: Widowed    Spouse name: Not on file   . Number of children: Not on file  . Years of education: Not on file  . Highest education level: Not on file  Occupational History  . Not on file  Tobacco Use  . Smoking status: Never Smoker  . Smokeless tobacco: Never Used  Substance and Sexual Activity  . Alcohol use: Yes    Comment: 09/16/2016 "2-3 glasses of wine when at the beach; once/year"  . Drug use: No  . Sexual activity: Not Currently    Birth control/protection: Abstinence  Other Topics Concern  . Not on file  Social History Narrative  . Not on file   Social Determinants of Health   Financial Resource Strain: Not on file  Food Insecurity: Not on file  Transportation Needs: Not on file  Physical Activity: Not on file  Stress: Not on file  Social Connections: Not on file    Allergies  Allergen Reactions  . Fig Extract [Ficus] Anaphylaxis  . Citalopram Hydrobromide Other (See Comments)    Pt unsure if this is accurate  . Sulfa Antibiotics Other (See Comments)    Severe nervousness.  . Prednisone Other (See Comments)    Other reaction(s): Other (See Comments) So nervous I could climb the walls So  nervous I could climb the walls      Current Outpatient Medications:  .  acetaminophen (TYLENOL) 500 MG tablet, Take 500 mg by mouth every 6 (six) hours as needed. For pain., Disp: , Rfl:  .  albuterol (PROVENTIL HFA;VENTOLIN HFA) 108 (90 BASE) MCG/ACT inhaler, Inhale 2 puffs into the lungs every 6 (six) hours as needed. Patient uses 2 puffs of this medication at 6 am., Disp: , Rfl:  .  albuterol (PROVENTIL) (2.5 MG/3ML) 0.083% nebulizer solution, Take 2.5 mg by nebulization every 6 (six) hours as needed., Disp: , Rfl:  .  apixaban (ELIQUIS) 5 MG TABS tablet, Take 1 tablet (5 mg total) by mouth 2 (two) times daily., Disp: 60 tablet, Rfl: 1 .  docusate sodium (COLACE) 100 MG capsule, Take 100 mg by mouth 2 (two) times daily., Disp: , Rfl:  .  Fluticasone-Salmeterol (ADVAIR) 250-50 MCG/DOSE AEPB, Inhale 1 puff into  the lungs every 12 (twelve) hours., Disp: , Rfl:  .  furosemide (LASIX) 40 MG tablet, Take 1 tablet (40 mg total) by mouth daily., Disp: 30 tablet, Rfl: 1 .  loratadine (CLARITIN) 10 MG tablet, Take 10 mg by mouth daily., Disp: , Rfl:  .  penicillin G IVPB, Inject 24 Million Units into the vein daily for 25 days. As a continuous infusion. Indication:  Actinomyces Bacteremia  First Dose: No Last Day of Therapy:  12/07/2020 Labs - Once weekly:  CBC/D and BMP, Labs - Every other week:  ESR and CRP Method of administration: Elastomeric (Continuous infusion) Method of administration may be changed at the discretion of home infusion pharmacist based upon assessment of the patient and/or caregiver's ability to self-administer the medication ordered., Disp: 25 Units, Rfl: 0 .  calcium carbonate (TITRALAC) 420 MG CHEW, Chew 420 mg by mouth daily as needed for heartburn. (Patient not taking: Reported on 11/28/2020), Disp: , Rfl:  .  diltiazem (CARDIZEM CD) 120 MG 24 hr capsule, Take 1 capsule (120 mg total) by mouth daily. (Patient not taking: Reported on 11/28/2020), Disp: 30 capsule, Rfl: 1 .  potassium chloride 20 MEQ TBCR, Take 40 mEq by mouth in the morning, at noon, in the evening, and at bedtime for 7 days., Disp: 56 tablet, Rfl: 0 .  rosuvastatin (CRESTOR) 20 MG tablet, Take 20 mg by mouth daily. (Patient not taking: Reported on 11/28/2020), Disp: , Rfl:  .  sodium chloride 1 g tablet, Take 1 tablet (1 g total) by mouth 2 (two) times daily with a meal. (Patient not taking: Reported on 11/28/2020), Disp: 6 tablet, Rfl: 0 .  tiotropium (SPIRIVA) 18 MCG inhalation capsule, Place 18 mcg into inhaler and inhale daily. (Patient not taking: Reported on 11/28/2020), Disp: , Rfl:  .  zolpidem (AMBIEN) 10 MG tablet, Take 5 mg by mouth at bedtime as needed for sleep. (Patient not taking: Reported on 11/28/2020), Disp: , Rfl:    Review of Systems  Constitutional: Negative for activity change, appetite change, chills,  diaphoresis, fatigue, fever and unexpected weight change.  HENT: Negative for congestion, rhinorrhea, sinus pressure, sneezing, sore throat and trouble swallowing.   Eyes: Negative for photophobia and visual disturbance.  Respiratory: Negative for cough, chest tightness, shortness of breath, wheezing and stridor.   Cardiovascular: Negative for chest pain, palpitations and leg swelling.  Gastrointestinal: Negative for abdominal distention, abdominal pain, anal bleeding, blood in stool, constipation, diarrhea, nausea and vomiting.  Genitourinary: Negative for difficulty urinating, dysuria, flank pain and hematuria.  Musculoskeletal: Negative for arthralgias, back pain, gait  problem, joint swelling and myalgias.  Skin: Negative for color change, pallor, rash and wound.  Neurological: Negative for dizziness, tremors, weakness and light-headedness.  Hematological: Negative for adenopathy. Does not bruise/bleed easily.  Psychiatric/Behavioral: Negative for agitation, behavioral problems, confusion, decreased concentration, dysphoric mood and sleep disturbance.       Objective:   Physical Exam Constitutional:      General: She is not in acute distress.    Appearance: She is not diaphoretic.  HENT:     Head: Normocephalic and atraumatic.     Right Ear: External ear normal.     Left Ear: External ear normal.     Nose: Nose normal.     Mouth/Throat:     Pharynx: No oropharyngeal exudate.  Eyes:     General: No scleral icterus.    Conjunctiva/sclera: Conjunctivae normal.     Pupils: Pupils are equal, round, and reactive to light.  Cardiovascular:     Rate and Rhythm: Normal rate and regular rhythm.     Heart sounds: Normal heart sounds. No murmur heard. No friction rub. No gallop.   Pulmonary:     Effort: Pulmonary effort is normal. No respiratory distress.     Breath sounds: Normal breath sounds. No wheezing, rhonchi or rales.  Abdominal:     General: Bowel sounds are normal. There is no  distension.     Palpations: Abdomen is soft.     Tenderness: There is no abdominal tenderness. There is no rebound.  Musculoskeletal:        General: No tenderness. Normal range of motion.     Cervical back: Normal range of motion and neck supple.  Lymphadenopathy:     Cervical: No cervical adenopathy.  Skin:    General: Skin is warm and dry.     Coloration: Skin is not pale.     Findings: No erythema or rash.  Neurological:     General: No focal deficit present.     Mental Status: She is alert and oriented to person, place, and time.     Coordination: Coordination normal.  Psychiatric:        Mood and Affect: Mood normal.        Behavior: Behavior normal.        Thought Content: Thought content normal.        Judgment: Judgment normal.    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  Work-up included CT scan which showed possible duodenitis.  We did a CT maxillofacial which was unrevealing and a transesophageal echocardiogram which was negative for vegetations.  She is completing a 6-week course of IV penicillin which will follow up with oral penicillin to push towards 6 months to a year.  I spent greater than 40 minutes with the patient including greater than 50% of time in face to face counsel of the patient and her daughter regarding the nature of actinomyces infection reviewing laboratory data and coordination with home health

## 2020-11-28 NOTE — Progress Notes (Signed)
Per MD called advance with orders to pull picc after last dose. Spoke with Melissa who was able to take order.

## 2020-12-01 ENCOUNTER — Other Ambulatory Visit (HOSPITAL_COMMUNITY): Payer: Self-pay

## 2020-12-01 ENCOUNTER — Telehealth: Payer: Self-pay

## 2020-12-01 ENCOUNTER — Other Ambulatory Visit: Payer: Self-pay | Admitting: Infectious Disease

## 2020-12-01 LAB — URINALYSIS, ROUTINE W REFLEX MICROSCOPIC
Bilirubin Urine: NEGATIVE
Glucose, UA: NEGATIVE
Hyaline Cast: NONE SEEN /LPF
Ketones, ur: NEGATIVE
Nitrite: NEGATIVE
Protein, ur: NEGATIVE
Specific Gravity, Urine: 1.012 (ref 1.001–1.03)
Squamous Epithelial / HPF: NONE SEEN /HPF (ref <=5)
WBC, UA: >=60 /HPF — AB (ref 0–5)
pH: <=5 (ref 5.0–8.0)

## 2020-12-01 LAB — URINE CULTURE
MICRO NUMBER:: 11749853
SPECIMEN QUALITY:: ADEQUATE

## 2020-12-01 MED ORDER — FOSFOMYCIN TROMETHAMINE 3 G PO PACK: PACK | ORAL | 1 refills | Status: DC

## 2020-12-01 NOTE — Telephone Encounter (Signed)
-----   Message from Truman Hayward, MD sent at 12/01/2020 11:32 AM EDT ----- Patient growing ESBL with extensive R. I would like to try to treat her with fosfomycin 3 grams once and then again in 3 days>Donna can you find a pharmacy that has it and we can send in some. Alternatively if she still has PICC line we could have adanced give her ertapenem 1 gram daily for 7 days

## 2020-12-01 NOTE — Telephone Encounter (Addendum)
Patient informed of results and verbalized understanding. Patient is okay with fosfomycin being sent to [facility] on [address], which should be listed on her chart.  I have also called the [facility] and was advised they do have it in stock. A Good Rx coupon for the fosfomycin has been faxed to the pharmacy as well.  Tnia Anglada T Brooks Sailors

## 2020-12-01 NOTE — Progress Notes (Signed)
The script can go to [facility] on [address]

## 2020-12-04 ENCOUNTER — Other Ambulatory Visit: Payer: Self-pay

## 2020-12-04 ENCOUNTER — Other Ambulatory Visit (HOSPITAL_COMMUNITY): Payer: Self-pay

## 2020-12-04 ENCOUNTER — Emergency Department (HOSPITAL_BASED_OUTPATIENT_CLINIC_OR_DEPARTMENT_OTHER)
Admission: EM | Admit: 2020-12-04 | Discharge: 2020-12-04 | Disposition: A | Discharge status: Home / Self Care | Payer: Medicare Other | Attending: Emergency Medicine | Admitting: Emergency Medicine

## 2020-12-04 ENCOUNTER — Encounter (HOSPITAL_BASED_OUTPATIENT_CLINIC_OR_DEPARTMENT_OTHER): Payer: Self-pay | Admitting: Emergency Medicine

## 2020-12-04 DIAGNOSIS — Z85118 Personal history of other malignant neoplasm of bronchus and lung: Secondary | ICD-10-CM | POA: Insufficient documentation

## 2020-12-04 DIAGNOSIS — I4819 Other persistent atrial fibrillation: Secondary | ICD-10-CM | POA: Diagnosis not present

## 2020-12-04 DIAGNOSIS — Z7901 Long term (current) use of anticoagulants: Secondary | ICD-10-CM | POA: Insufficient documentation

## 2020-12-04 DIAGNOSIS — S61412A Laceration without foreign body of left hand, initial encounter: Secondary | ICD-10-CM | POA: Diagnosis not present

## 2020-12-04 DIAGNOSIS — Z85828 Personal history of other malignant neoplasm of skin: Secondary | ICD-10-CM | POA: Diagnosis not present

## 2020-12-04 DIAGNOSIS — J449 Chronic obstructive pulmonary disease, unspecified: Secondary | ICD-10-CM | POA: Insufficient documentation

## 2020-12-04 DIAGNOSIS — Z79899 Other long term (current) drug therapy: Secondary | ICD-10-CM | POA: Insufficient documentation

## 2020-12-04 DIAGNOSIS — W228XXA Striking against or struck by other objects, initial encounter: Secondary | ICD-10-CM | POA: Insufficient documentation

## 2020-12-04 DIAGNOSIS — Z7952 Long term (current) use of systemic steroids: Secondary | ICD-10-CM | POA: Insufficient documentation

## 2020-12-04 DIAGNOSIS — Z951 Presence of aortocoronary bypass graft: Secondary | ICD-10-CM | POA: Diagnosis not present

## 2020-12-04 DIAGNOSIS — I10 Essential (primary) hypertension: Secondary | ICD-10-CM | POA: Diagnosis not present

## 2020-12-04 DIAGNOSIS — S6992XA Unspecified injury of left wrist, hand and finger(s), initial encounter: Secondary | ICD-10-CM | POA: Diagnosis present

## 2020-12-04 NOTE — ED Provider Notes (Signed)
[facility] EMERGENCY DEPARTMENT Provider Note   CSN: [id number] Arrival date & time: 12/04/20  1538     History Chief Complaint  Patient presents with  . Hand Injury    left    Tracy Buck is a 86 y.o. female.  Patient presents from home with left hand abrasion.  States that last night she hit her hand on her wheelchair and has had bleeding since then.  Was seen at [facility] this morning, had silver nitrate applied, and was told that if it continued to bleed, she needed to be seen at the ED.  Her son-in-law noted that she was having more bleeding on the bandage and brought her to the ED.  She states that her hand is "throbbing" a little now, but otherwise doing well.  Has not changed the bandage since this morning at [facility].  States that she has not had a large amount of blood loss.  Denies chest pain and shortness of breath.        Past Medical History:  Diagnosis Date  . Arthritis    "fingers, knees" (09/16/2016)  . Complication of anesthesia    "I often go into severe panic attacks when they try to bring me out and they have to put me back under; sometimes happens when they are starting the anesthesia also" (09/16/2016)  . COPD (chronic obstructive pulmonary disease) ([place])    "I think this has been recently dx'd" (09/16/2016)  . Heart valve replaced   . Hyperlipidemia   . Hypertension   . LBBB (left bundle branch block)   . Lung cancer ([place]) 2013  . Lung mass   . Myocardial infarct ([place])   . Panic attack   . Pneumonia    "when I was very young" (09/16/2016)  . Rib fracture   . Skin cancer of nose     Patient Active Problem List   Diagnosis Date Noted  . Persistent atrial fibrillation ([place])   . S/P PICC central line placement   . Sepsis with acute renal failure without septic shock ([place])   . 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 Fatigue   . Bacteremia   . Acute renal failure ([place])   . Malnutrition of moderate degree 11/07/2020  . Acute respiratory  failure with hypoxia ([place]) 11/05/2020  . Lactic acidosis   . Elevated troponin   . Atrial fibrillation with RVR ([place])   . Severe sepsis with septic shock ([place])   . Cholecystitis 09/16/2016  . S/P AVR (aortic valve replacement) 09/16/2016  . S/P CABG (coronary artery bypass graft) 09/16/2016  . Essential hypertension 09/16/2016  . COPD (chronic obstructive pulmonary disease) ([place]) 09/16/2016  . Hyperglycemia 09/16/2016  . Pre-op evaluation   . Hyperlipidemia     Past Surgical History:  Procedure Laterality Date  . AORTIC VALVE REPLACEMENT     "bovine valve"  . BYPASS GRAFT     does not know if she had CABG (09/16/2016)  . CARDIAC SURGERY     does not know if she had CABG (09/16/2016)  . CARDIAC VALVE REPLACEMENT    . CARPAL TUNNEL RELEASE Right 1959  . CHOLECYSTECTOMY N/A 09/17/2016   Procedure: LAPAROSCOPIC CHOLECYSTECTOMY;  Surgeon: Ralene Ok, MD;  Location: [facility];  Service: General;  Laterality: N/A;  . [place]  . KNEE ARTHROSCOPY Left 09/2014  . LOBECTOMY Left 2013   "took bottom lobe off"  . SKIN CANCER EXCISION     "tip of my  nose"  . TEE WITHOUT CARDIOVERSION N/A 11/11/2020   Procedure: TRANSESOPHAGEAL ECHOCARDIOGRAM (TEE);  Surgeon: Skeet Latch, MD;  Location[facility] ENDOSCOPY;  Service: Cardiovascular;  Laterality: N/A;  . TONSILLECTOMY       OB History   No obstetric history on file.     Family History  Problem Relation Age of Onset  . Transient ischemic attack Mother   . Colon cancer Father     Social History   Tobacco Use  . Smoking status: Never Smoker  . Smokeless tobacco: Never Used  Substance Use Topics  . Alcohol use: Yes    Comment: 09/16/2016 "2-3 glasses of wine when at the beach; once/year"  . Drug use: No    Home Medications Prior to Admission medications   Medication Sig Start Date End Date Taking? Authorizing Provider  acetaminophen (TYLENOL) 500 MG tablet Take 500 mg by mouth every 6 (six) hours  as needed. For pain.    [provider]  albuterol (PROVENTIL HFA;VENTOLIN HFA) 108 (90 BASE) MCG/ACT inhaler Inhale 2 puffs into the lungs every 6 (six) hours as needed. Patient uses 2 puffs of this medication at 6 am.    [provider]  albuterol (PROVENTIL) (2.5 MG/3ML) 0.083% nebulizer solution Take 2.5 mg by nebulization every 6 (six) hours as needed.    [provider]  apixaban (ELIQUIS) 5 MG TABS tablet Take 1 tablet (5 mg total) by mouth 2 (two) times daily. 11/17/20   Shawna Clamp, MD  calcium carbonate (TITRALAC) 420 MG CHEW Chew 420 mg by mouth daily as needed for heartburn. Patient not taking: Reported on 11/28/2020    [provider]  diltiazem (CARDIZEM CD) 120 MG 24 hr capsule Take 1 capsule (120 mg total) by mouth daily. Patient not taking: Reported on 11/28/2020 11/18/20   Shawna Clamp, MD  docusate sodium (COLACE) 100 MG capsule Take 100 mg by mouth 2 (two) times daily.    [provider]  Fluticasone-Salmeterol (ADVAIR) 250-50 MCG/DOSE AEPB Inhale 1 puff into the lungs every 12 (twelve) hours.    [provider]  fosfomycin (MONUROL) 3 g PACK First dose today and then another dose in 3 days 12/01/20   Tommy Medal, Lavell Islam, MD  furosemide (LASIX) 40 MG tablet Take 1 tablet (40 mg total) by mouth daily. 11/18/20   Shawna Clamp, MD  loratadine (CLARITIN) 10 MG tablet Take 10 mg by mouth daily.    [provider]  penicillin G IVPB Inject 24 Million Units into the vein daily for 25 days. As a continuous infusion. Indication:  Actinomyces Bacteremia  First Dose: No Last Day of Therapy:  12/07/2020 Labs - Once weekly:  CBC/D and BMP, Labs - Every other week:  ESR and CRP Method of administration: Elastomeric (Continuous infusion) Method of administration may be changed at the discretion of home infusion pharmacist based upon assessment of the patient and/or caregiver's ability to self-administer the medication ordered.  11/14/20 12/09/20  Truman Hayward, MD  penicillin v potassium (VEETID) 500 MG tablet Take 1 tablet (500 mg total) by mouth 4 (four) times daily. 11/28/20   Truman Hayward, MD  potassium chloride 20 MEQ TBCR Take 40 mEq by mouth in the morning, at noon, in the evening, and at bedtime for 7 days. 11/19/20 11/26/20  Arnaldo Natal, MD  rosuvastatin (CRESTOR) 20 MG tablet Take 20 mg by mouth daily. Patient not taking: Reported on 11/28/2020    [provider]  sodium chloride  1 g tablet Take 1 tablet (1 g total) by mouth 2 (two) times daily with a meal. Patient not taking: Reported on 11/28/2020 11/17/20   Shawna Clamp, MD  tiotropium (SPIRIVA) 18 MCG inhalation capsule Place 18 mcg into inhaler and inhale daily. Patient not taking: Reported on 11/28/2020    [provider]  zolpidem (AMBIEN) 10 MG tablet Take 5 mg by mouth at bedtime as needed for sleep. Patient not taking: Reported on 11/28/2020    [provider]    Allergies    Fig extract [ficus], Citalopram hydrobromide, Sulfa antibiotics, and Prednisone  Review of Systems   Review of Systems  Constitutional: Negative for fever.  Respiratory: Negative for shortness of breath.   Cardiovascular: Negative for chest pain.  Gastrointestinal: Negative for abdominal pain.  Musculoskeletal:       Left hand pain and bleeding from laceration  Skin: Positive for wound.  Neurological: Negative for dizziness.  Psychiatric/Behavioral: Negative for confusion.    Physical Exam Updated Vital Signs BP (!) 121/58   Pulse (!) 58   Temp 98.2 F (36.8 C) (Oral)   Resp 16   Ht 5' 3"  (1.6 m)   Wt 71 kg   SpO2 98%   BMI 27.73 kg/m   Physical Exam Constitutional:      General: She is not in acute distress.    Appearance: She is not ill-appearing or toxic-appearing.  HENT:     Head: Normocephalic and atraumatic.  Cardiovascular:     Rate and Rhythm: Normal rate and regular rhythm.  Pulmonary:     Effort: Pulmonary  effort is normal.     Breath sounds: Normal breath sounds.  Musculoskeletal:     Left hand: No bony tenderness. Normal range of motion. Normal strength.       Hands:  Neurological:     Mental Status: She is alert.          ED Results / Procedures / Treatments   Labs (all labs ordered are listed, but only abnormal results are displayed) Labs Reviewed - No data to display  EKG None  Radiology No results found.  Procedures Procedures   Medications Ordered in ED Medications - No data to display  ED Course  I have reviewed the triage vital signs and the nursing notes.  Pertinent labs & imaging results that were available during my care of the patient were reviewed by me and considered in my medical decision making (see chart for details).    MDM Rules/Calculators/A&P                          Patient is an 86 yo female with A fib on Eliquis, who presents with left hand skin tear.  She had lidocaine with epinephrine at [facility] with silver nitrate applied.  Her vitals are stable, she is well-appearing, no signs of acute anemia.  She has some mild oozing, but no brisk bleeding.  No large wound to repair with stitches.  Area cleansed with normal saline, small amount of oozing noted afterwards.  Placed vaseline-soaked gauze, dry gauze over, and wrapped with coban.  Advised to keep elevated and can use ice as well to cause vasoconstriction.  Discussed with daughter than slight bleeding will continue, but can change bandage and continue with compression and elevation.  Advised to return to care if large amounts of bleeding that cannot be stopped, or if she develops chest pain, shortness of breath, or dizziness.  Daughter and patient voiced understanding.  Patient discharged home in stable condition.   Final Clinical Impression(s) / ED Diagnoses Final diagnoses:  Skin tear of left hand without complication, initial encounter    Rx / DC Orders ED Discharge Orders    None        Cleophas Dunker, DO 12/04/20 1710    Little, Wenda Overland, MD 12/08/20 [phone]

## 2020-12-04 NOTE — ED Triage Notes (Signed)
Pt c/o catching left hand between her body and wheelchair last night and pushing up the skin on top of her hand. Pt states that this morning they went to [facility] and had silver nitrate used on the hand. Per family urgent care stated that if the area was still bleeding that they should go to an ER due to not being able to help more at the urgent care. Area covered with bandaide. No bleeding noted in triage. Pt aaox3, in wheelchair, VSS, GCS 15.

## 2020-12-04 NOTE — Discharge Instructions (Signed)
Keep the area clean.  Use vaseline and gauze to cover the area to make sure the gauze isn't stuck.  The band around it should be snug, but not too tight.  Keep it elevated.  You can also use ice.  This will likely continue to have some slow bleeding, but you should come back if there is a large amount of bleeding that you can't get stopped or if she develops worsening chest pain, shortness of breath, or dizziness.

## 2020-12-15 ENCOUNTER — Encounter: Payer: Self-pay | Admitting: Adult Health

## 2020-12-15 ENCOUNTER — Ambulatory Visit (INDEPENDENT_AMBULATORY_CARE_PROVIDER_SITE_OTHER): Payer: Medicare Other | Admitting: Adult Health

## 2020-12-15 ENCOUNTER — Ambulatory Visit (INDEPENDENT_AMBULATORY_CARE_PROVIDER_SITE_OTHER): Payer: Medicare Other

## 2020-12-15 ENCOUNTER — Other Ambulatory Visit: Payer: Self-pay

## 2020-12-15 VITALS — BP 114/68 | HR 59 | Temp 98.2°F | Ht 64.0 in | Wt 149.0 lb

## 2020-12-15 DIAGNOSIS — J90 Pleural effusion, not elsewhere classified: Secondary | ICD-10-CM

## 2020-12-15 DIAGNOSIS — A429 Actinomycosis, unspecified: Secondary | ICD-10-CM | POA: Diagnosis not present

## 2020-12-15 DIAGNOSIS — J449 Chronic obstructive pulmonary disease, unspecified: Secondary | ICD-10-CM | POA: Diagnosis not present

## 2020-12-15 NOTE — Assessment & Plan Note (Signed)
Recent hospitalization with severe sepsis with bacteremia-isolated actinomyces infection.  Now on prolonged antibiotics.  Patient is making slow clinical progress.  She is continue on antibiotics as prescribed by infectious disease. 2D echo and transesophageal echo negative for valve vegetation.  CT sinus was negative. Advised to follow-up with dentist to rule out possible underlying dental issue although no obvious dental caries on exam  Plan  Patient Instructions  Chest xray today .  Continue on Advair and Spiriva  Follow up with Dentist as discussed  Continue Antibiotics as prescribed.  Follow up with your Pulmonologist in 6-8 weeks  Follow up with Dr. Tommy Medal as planned  Follow up with our office As needed

## 2020-12-15 NOTE — Patient Instructions (Signed)
Chest xray today .  Continue on Advair and Spiriva  Follow up with Dentist as discussed  Continue Antibiotics as prescribed.  Follow up with your Pulmonologist in 6-8 weeks  Follow up with Dr. Tommy Medal as planned  Follow up with our office As needed

## 2020-12-15 NOTE — Assessment & Plan Note (Signed)
Patient is a never smoker.  Carries a diagnosis of COPD/asthma.  Patient has a pulmonologist in [facility] that she follows with regular.  Have advised her to follow back up with him continue on Advair and Spiriva.  We will check chest x-ray today as she had pleural effusions felt to be secondary to volume overload during hospitalization with A. fib RVR  Plan  Patient Instructions  Chest xray today .  Continue on Advair and Spiriva  Follow up with Dentist as discussed  Continue Antibiotics as prescribed.  Follow up with your Pulmonologist in 6-8 weeks  Follow up with Dr. Tommy Medal as planned  Follow up with our office As needed

## 2020-12-15 NOTE — Assessment & Plan Note (Signed)
Small pleural effusions during recent hospitalization.  Felt to be secondary to volume overload.  Check chest x-ray today.

## 2020-12-15 NOTE — Progress Notes (Signed)
@Patient  ID: Tracy Buck, female    DOB: 1935/03/10, 87 y.o.   MRN: [id number]  Chief Complaint  Patient presents with  . 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  Persistent A. fib on anticoagulation, asthma  TEST/EVENTS :   12/15/2020 [facility] Follow up : Bacteremia , Sepsis , Pleural Effusion  Patient presents for a post hospital follow-up.  Patient was seen for pulmonary consult during hospitalization last month.  Patient had a complicated hospitalization.  She was admitted March 16 through March 28 with severe sepsis complicated by septic shock, bacteremia with actinomyces infection.  Patient was seen by infectious disease and recommended on prolonged antibiotics.  She underwent a 2D echo and transesophageal echo that ruled out vegetation.  Patient's hospitalization was complicated by A. fib with RVR, fluid overload.  She did require aggressive diuresis.  CT sinuses was unrevealing.  Since discharge patient says she is starting to feel some better.  But remains very weak and has low appetite.  She has been seen by infectious disease earlier this month and has completed 6-week course of IV penicillin.  She has been recommended to continue on oral penicillin for at least 6 months (possibly up to 1 year). Patient denies any chest pain orthopnea PND or increased leg swelling.  Says her breathing is starting to do better. Patient is followed by pulmonology and [place].  Says she has underlying chronic asthma and is on Advair and Spiriva.  She denies any increased cough or congestion. Patient was recently treated again for a severe urinary tract infection.  She has completed a course of  fosfomycin.  She says she is starting to feel some better but remains very weak.  Prior to admission , lives at home with daughter . Independent , active yard work , driving .      Allergies  Allergen Reactions  . Fig Extract [Ficus] Anaphylaxis  . Citalopram Hydrobromide Other (See Comments)    Pt unsure if this is accurate  . Sulfa Antibiotics Other (See Comments)    Severe nervousness.  . Prednisone Other (See Comments)    Other reaction(s): Other (See Comments) So nervous I could climb the walls So nervous I could climb the walls     Immunization History  Administered Date(s) Administered  . Influenza-Unspecified 05/07/2019, 05/02/2020  . PFIZER(Purple Top)SARS-COV-2 Vaccination 09/07/2019, 10/02/2019    Past Medical History:  Diagnosis Date  . Arthritis    "fingers, knees" (09/16/2016)  . Complication of anesthesia    "I often go into severe panic attacks when they try to bring me out and they have to put me back under; sometimes happens when they are starting the anesthesia also" (09/16/2016)  . COPD (chronic obstructive pulmonary disease) ([place])    "I think this has been recently dx'd" (09/16/2016)  . Heart valve replaced   . Hyperlipidemia   . Hypertension   . LBBB (left bundle branch block)   . Lung cancer ([place]) 2013  . Lung mass   . Myocardial infarct ([place])   . Panic attack   . Pneumonia    "when I was very young" (09/16/2016)  . Rib fracture   . Skin cancer of nose     Tobacco History: Social  History   Tobacco Use  Smoking Status Never Smoker  Smokeless Tobacco Never Used   Counseling given: Not Answered   Outpatient Medications Prior to Visit  Medication Sig Dispense Refill  . Fluticasone-Salmeterol (ADVAIR) 250-50 MCG/DOSE AEPB Inhale 1 puff into the lungs every 12 (twelve) hours.    . furosemide (LASIX) 40 MG tablet Take 1 tablet (40 mg total) by mouth daily. 30 tablet 1  . loratadine (CLARITIN) 10 MG tablet Take 10 mg by mouth daily.    .  penicillin v potassium (VEETID) 500 MG tablet Take 1 tablet (500 mg total) by mouth 4 (four) times daily. 60 tablet 5  . rosuvastatin (CRESTOR) 20 MG tablet Take 20 mg by mouth daily.    Marland Kitchen acetaminophen (TYLENOL) 500 MG tablet Take 500 mg by mouth every 6 (six) hours as needed. For pain.    Marland Kitchen albuterol (PROVENTIL HFA;VENTOLIN HFA) 108 (90 BASE) MCG/ACT inhaler Inhale 2 puffs into the lungs every 6 (six) hours as needed. Patient uses 2 puffs of this medication at 6 am. (Patient not taking: Reported on 12/15/2020)    . albuterol (PROVENTIL) (2.5 MG/3ML) 0.083% nebulizer solution Take 2.5 mg by nebulization every 6 (six) hours as needed. (Patient not taking: Reported on 12/15/2020)    . apixaban (ELIQUIS) 5 MG TABS tablet Take 1 tablet (5 mg total) by mouth 2 (two) times daily. 60 tablet 1  . calcium carbonate (TITRALAC) 420 MG CHEW Chew 420 mg by mouth daily as needed for heartburn. (Patient not taking: No sig reported)    . diltiazem (CARDIZEM CD) 120 MG 24 hr capsule Take 1 capsule (120 mg total) by mouth daily. (Patient not taking: No sig reported) 30 capsule 1  . docusate sodium (COLACE) 100 MG capsule Take 100 mg by mouth 2 (two) times daily. (Patient not taking: Reported on 12/15/2020)    . fosfomycin (MONUROL) 3 g PACK First dose today and then another dose in 3 days (Patient not taking: Reported on 12/15/2020) 6 g 1  . potassium chloride 20 MEQ TBCR Take 40 mEq by mouth in the morning, at noon, in the evening, and at bedtime for 7 days. 56 tablet 0  . sodium chloride 1 g tablet Take 1 tablet (1 g total) by mouth 2 (two) times daily with a meal. (Patient not taking: No sig reported) 6 tablet 0  . tiotropium (SPIRIVA) 18 MCG inhalation capsule Place 18 mcg into inhaler and inhale daily. (Patient not taking: No sig reported)    . zolpidem (AMBIEN) 10 MG tablet Take 5 mg by mouth at bedtime as needed for sleep. (Patient not taking: No sig reported)     No facility-administered medications prior to  visit.     Review of Systems:   Constitutional:   No  weight loss, night sweats,  Fevers, chills,  +fatigue, or  lassitude.  HEENT:   No headaches,  Difficulty swallowing,  Tooth/dental problems, or  Sore throat,                No sneezing, itching, ear ache, nasal congestion, post nasal drip,   CV:  No chest pain,  Orthopnea, PND, swelling in lower extremities, anasarca, dizziness, palpitations, syncope.   GI  No heartburn, indigestion, abdominal pain, nausea, vomiting, diarrhea, change in bowel habits, loss of appetite, bloody stools.   Resp:   No chest wall deformity  Skin: no rash or lesions.  GU: no dysuria, change in color of urine, no urgency or frequency.  No  flank pain, no hematuria   MS:  No joint pain or swelling.  No decreased range of motion.  No back pain.    Physical Exam  BP 114/68 (BP Location: Left Arm, Cuff Size: Normal)   Pulse (!) 59   Temp 98.2 F (36.8 C) (Temporal)   Ht 5\' 4"  (1.626 m)   Wt 149 lb (67.6 kg)   SpO2 98% Comment: RA  BMI 25.58 kg/m   GEN: A/Ox3; pleasant , NAD, well nourished    HEENT:  [place]/AT,   NOSE-clear, THROAT-clear, no lesions, no postnasal drip or exudate noted.   NECK:  Supple w/ fair ROM; no JVD; normal carotid impulses w/o bruits; no thyromegaly or nodules palpated; no lymphadenopathy.    RESP  Clear  P & A; w/o, wheezes/ rales/ or rhonchi. no accessory muscle use, no dullness to percussion  CARD:  RRR, no m/r/g, tr  peripheral edema, pulses intact, no cyanosis or clubbing.  GI:   Soft & nt; nml bowel sounds; no organomegaly or masses detected.   Musco: Warm bil, no deformities or joint swelling noted.   Neuro: alert, no focal deficits noted.    Skin: Warm, no lesions or rashes    Lab Results:  CBC  BMET  ProBNP No results found for: PROBNP  Imaging: DG Chest 2 View  Result Date: 12/15/2020 CLINICAL DATA:  Follow-up pleural effusion EXAM: CHEST - 2 VIEW COMPARISON:  November 12, 2020 FINDINGS: Changes of  prior median sternotomy and TAVR. The heart size and mediastinal contours are within normal limits. Both lungs are clear. Small left pleural effusion, decreased from prior. Trace right-sided pleural effusion, decreased from prior. No visible pneumothorax. The visualized skeletal structures are unremarkable. IMPRESSION: Decreased size of the small left and trace right pleural effusions. Electronically Signed   By: Dahlia Bailiff MD   On: 12/15/2020 17:23      No flowsheet data found.  No results found for: NITRICOXIDE      Assessment & Plan:   Actinomyces infection Recent hospitalization with severe sepsis with bacteremia-isolated actinomyces infection.  Now on prolonged antibiotics.  Patient is making slow clinical progress.  She is continue on antibiotics as prescribed by infectious disease. 2D echo and transesophageal echo negative for valve vegetation.  CT sinus was negative. Advised to follow-up with dentist to rule out possible underlying dental issue although no obvious dental caries on exam  Plan  Patient Instructions  Chest xray today .  Continue on Advair and Spiriva  Follow up with Dentist as discussed  Continue Antibiotics as prescribed.  Follow up with your Pulmonologist in 6-8 weeks  Follow up with Dr. Tommy Medal as planned  Follow up with our office As needed       COPD (chronic obstructive pulmonary disease) [facility]) Patient is a never smoker.  Carries a diagnosis of COPD/asthma.  Patient has a pulmonologist in [facility] that she follows with regular.  Have advised her to follow back up with him continue on Advair and Spiriva.  We will check chest x-ray today as she had pleural effusions felt to be secondary to volume overload during hospitalization with A. fib RVR  Plan  Patient Instructions  Chest xray today .  Continue on Advair and Spiriva  Follow up with Dentist as discussed  Continue Antibiotics as prescribed.  Follow up with your Pulmonologist in 6-8 weeks   Follow up with Dr. Tommy Medal as planned  Follow up with our office As needed  Pleural effusion Small pleural effusions during recent hospitalization.  Felt to be secondary to volume overload.  Check chest x-ray today.     Rexene Edison, NP 12/15/2020

## 2020-12-16 NOTE — Progress Notes (Signed)
Called and spoke with patient, advised of results/recommendations per Tammy Parrett NP.  She verbalized understanding.  Nothing further needed.

## 2021-01-16 ENCOUNTER — Ambulatory Visit: Payer: Medicare Other | Admitting: Infectious Disease

## 2021-03-02 ENCOUNTER — Other Ambulatory Visit: Payer: Self-pay

## 2021-03-02 ENCOUNTER — Encounter: Payer: Self-pay | Admitting: Infectious Disease

## 2021-03-02 ENCOUNTER — Ambulatory Visit (INDEPENDENT_AMBULATORY_CARE_PROVIDER_SITE_OTHER): Payer: Medicare Other | Admitting: Infectious Disease

## 2021-03-02 VITALS — BP 122/82 | HR 67 | Temp 97.5°F | Wt 146.0 lb

## 2021-03-02 DIAGNOSIS — N179 Acute kidney failure, unspecified: Secondary | ICD-10-CM

## 2021-03-02 DIAGNOSIS — R6520 Severe sepsis without septic shock: Secondary | ICD-10-CM | POA: Diagnosis not present

## 2021-03-02 DIAGNOSIS — J90 Pleural effusion, not elsewhere classified: Secondary | ICD-10-CM | POA: Diagnosis not present

## 2021-03-02 DIAGNOSIS — S51811A Laceration without foreign body of right forearm, initial encounter: Secondary | ICD-10-CM | POA: Diagnosis not present

## 2021-03-02 DIAGNOSIS — A419 Sepsis, unspecified organism: Secondary | ICD-10-CM | POA: Diagnosis not present

## 2021-03-02 DIAGNOSIS — S0081XA Abrasion of other part of head, initial encounter: Secondary | ICD-10-CM | POA: Diagnosis not present

## 2021-03-02 DIAGNOSIS — Z951 Presence of aortocoronary bypass graft: Secondary | ICD-10-CM | POA: Diagnosis not present

## 2021-03-02 DIAGNOSIS — A429 Actinomycosis, unspecified: Secondary | ICD-10-CM | POA: Diagnosis not present

## 2021-03-02 DIAGNOSIS — W19XXXA Unspecified fall, initial encounter: Secondary | ICD-10-CM

## 2021-03-02 DIAGNOSIS — R7881 Bacteremia: Secondary | ICD-10-CM

## 2021-03-02 DIAGNOSIS — Z952 Presence of prosthetic heart valve: Secondary | ICD-10-CM | POA: Diagnosis not present

## 2021-03-02 DIAGNOSIS — W1800XA Striking against unspecified object with subsequent fall, initial encounter: Secondary | ICD-10-CM | POA: Insufficient documentation

## 2021-03-02 DIAGNOSIS — S51819A Laceration without foreign body of unspecified forearm, initial encounter: Secondary | ICD-10-CM | POA: Insufficient documentation

## 2021-03-02 DIAGNOSIS — I4891 Unspecified atrial fibrillation: Secondary | ICD-10-CM

## 2021-03-02 HISTORY — DX: Laceration without foreign body of unspecified forearm, initial encounter: S51.819A

## 2021-03-02 HISTORY — DX: Abrasion of other part of head, initial encounter: S00.81XA

## 2021-03-02 HISTORY — DX: Unspecified fall, initial encounter: W19.XXXA

## 2021-03-02 NOTE — Progress Notes (Signed)
Subjective:  Chief complaint follow-up for actinomyces infection on penicillin having had a recent mechanical fall   Patient ID: Tracy Buck, female    DOB: 12-29-1934, 86 y.o.   MRN: [id number]  HPI   86 y.o. 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  CT scan shows possible duodenitis. CT maxillofacial was unrevealing transesophageal echocardiogram was negative for vegetations  He received 6 weeks of IV penicillin was changed over to oral penicillin which has been on since.  She has having trouble tolerating it at all.  There was some concern about some pleural effusion still being present but she is seen by pulmonary and follow-up chest x-ray shows resolution of this process.  Is feeling so well in fact she went to the beach this past week with family.  While there she got up in the middle the night when it was dark and tripped over a stool.  She struck her forehead and also her right arm where she sustained a laceration.  Arm has been bandaged under fairly occlusive bandage since then.      Past Medical History:  Diagnosis Date   Arthritis    "fingers, knees" (04/09/5630)   Complication of anesthesia    "I often go into severe panic attacks when they try to bring me out and they have to put me back under; sometimes happens when they are starting the anesthesia also" (09/16/2016)   COPD (chronic obstructive pulmonary disease) ([place])    "I think this has been recently dx'd" (09/16/2016)   Heart valve replaced    Hyperlipidemia    Hypertension    LBBB (left bundle branch block)    Lung cancer ([place]) 2013   Lung mass    Myocardial infarct ([place])    Panic attack    Pneumonia    "when I was very young" (09/16/2016)   Rib fracture    Skin cancer of nose     Past Surgical History:  Procedure  Laterality Date   AORTIC VALVE REPLACEMENT     "bovine valve"   BYPASS GRAFT     does not know if she had CABG (09/16/2016)   [place]     does not know if she had CABG (09/16/2016)   CARDIAC VALVE REPLACEMENT     CARPAL TUNNEL RELEASE Right 1959   CHOLECYSTECTOMY N/A 09/17/2016   Procedure: LAPAROSCOPIC CHOLECYSTECTOMY;  Surgeon: Ralene Ok, MD;  Location: [facility];  Service: General;  Laterality: N/A;   [place] ARTHROSCOPY Left 09/2014   LOBECTOMY Left 2013   "took bottom lobe off"   SKIN CANCER EXCISION     "tip of my nose"   TEE WITHOUT CARDIOVERSION N/A 11/11/2020   Procedure: TRANSESOPHAGEAL ECHOCARDIOGRAM (TEE);  Surgeon: Skeet Latch, MD;  Location[facility] ENDOSCOPY;  Service: Cardiovascular;  Laterality: N/A;   TONSILLECTOMY      Family History  Problem Relation Age of Onset   Transient ischemic attack Mother    Colon cancer Father       Social History   Socioeconomic History   Marital status: Widowed    Spouse name: Not on file   Number of children: Not on file   Years of education: Not on file   Highest education level: Not on file  Occupational History  Not on file  Tobacco Use   Smoking status: Never   Smokeless tobacco: Never  Substance and Sexual Activity   Alcohol use: Yes    Comment: 09/16/2016 "2-3 glasses of wine when at the beach; once/year"   Drug use: No   Sexual activity: Not Currently    Birth control/protection: Abstinence  Other Topics Concern   Not on file  Social History Narrative   Not on file   Social Determinants of Health   Financial Resource Strain: Not on file  Food Insecurity: Not on file  Transportation Needs: Not on file  Physical Activity: Not on file  Stress: Not on file  Social Connections: Not on file    Allergies  Allergen Reactions   Fig Extract [Ficus] Anaphylaxis   Citalopram Hydrobromide Other (See Comments)    Pt unsure if this is accurate   Sulfa Antibiotics  Other (See Comments)    Severe nervousness.   Prednisone Other (See Comments)    Other reaction(s): Other (See Comments) So nervous I could climb the walls So nervous I could climb the walls      Current Outpatient Medications:    albuterol (PROVENTIL HFA;VENTOLIN HFA) 108 (90 BASE) MCG/ACT inhaler, Inhale 2 puffs into the lungs every 6 (six) hours as needed. Patient uses 2 puffs of this medication at 6 am., Disp: , Rfl:    albuterol (PROVENTIL) (2.5 MG/3ML) 0.083% nebulizer solution, Take 2.5 mg by nebulization every 6 (six) hours as needed., Disp: , Rfl:    apixaban (ELIQUIS) 5 MG TABS tablet, Take 1 tablet (5 mg total) by mouth 2 (two) times daily., Disp: 60 tablet, Rfl: 1   calcium carbonate (TITRALAC) 420 MG CHEW, Chew 420 mg by mouth daily as needed for heartburn., Disp: , Rfl:    diltiazem (CARDIZEM CD) 120 MG 24 hr capsule, Take 1 capsule (120 mg total) by mouth daily., Disp: 30 capsule, Rfl: 1   docusate sodium (COLACE) 100 MG capsule, Take 100 mg by mouth 2 (two) times daily., Disp: , Rfl:    Fluticasone-Salmeterol (ADVAIR) 250-50 MCG/DOSE AEPB, Inhale 1 puff into the lungs every 12 (twelve) hours., Disp: , Rfl:    furosemide (LASIX) 40 MG tablet, Take 1 tablet (40 mg total) by mouth daily., Disp: 30 tablet, Rfl: 1   loratadine (CLARITIN) 10 MG tablet, Take 10 mg by mouth daily., Disp: , Rfl:    penicillin v potassium (VEETID) 500 MG tablet, Take 1 tablet (500 mg total) by mouth 4 (four) times daily., Disp: 60 tablet, Rfl: 5   rosuvastatin (CRESTOR) 20 MG tablet, Take 20 mg by mouth daily., Disp: , Rfl:    sodium chloride 1 g tablet, Take 1 tablet (1 g total) by mouth 2 (two) times daily with a meal., Disp: 6 tablet, Rfl: 0   tiotropium (SPIRIVA) 18 MCG inhalation capsule, Place 18 mcg into inhaler and inhale daily., Disp: , Rfl:    traZODone (DESYREL) 50 MG tablet, TAKE 1 TABLET(50 MG) BY MOUTH EVERY NIGHT, Disp: , Rfl:    acetaminophen (TYLENOL) 500 MG tablet, Take 500 mg by  mouth every 6 (six) hours as needed. For pain. (Patient not taking: Reported on 03/02/2021), Disp: , Rfl:    fosfomycin (MONUROL) 3 g PACK, First dose today and then another dose in 3 days (Patient not taking: No sig reported), Disp: 6 g, Rfl: 1   potassium chloride SA (KLOR-CON) 20 MEQ tablet, Take 20 mEq by mouth 2 (two) times daily., Disp: , Rfl:  zolpidem (AMBIEN) 10 MG tablet, Take 5 mg by mouth at bedtime as needed for sleep. (Patient not taking: Reported on 03/02/2021), Disp: , Rfl:    Review of Systems  Constitutional:  Negative for activity change, appetite change, chills, diaphoresis, fatigue, fever and unexpected weight change.  HENT:  Negative for congestion, rhinorrhea, sinus pressure, sneezing, sore throat and trouble swallowing.   Eyes:  Negative for photophobia and visual disturbance.  Respiratory:  Negative for cough, chest tightness, shortness of breath, wheezing and stridor.   Cardiovascular:  Negative for chest pain, palpitations and leg swelling.  Gastrointestinal:  Negative for abdominal distention, abdominal pain, anal bleeding, blood in stool, constipation, diarrhea, nausea and vomiting.  Genitourinary:  Negative for difficulty urinating, dysuria, flank pain and hematuria.  Musculoskeletal:  Negative for arthralgias, back pain, gait problem, joint swelling and myalgias.  Skin:  Positive for wound. Negative for color change, pallor and rash.  Neurological:  Negative for dizziness, tremors, seizures, syncope, speech difficulty, weakness, light-headedness and numbness.  Hematological:  Negative for adenopathy. Does not bruise/bleed easily.  Psychiatric/Behavioral:  Negative for agitation, behavioral problems, confusion, decreased concentration, dysphoric mood and sleep disturbance.       Objective:   Physical Exam Constitutional:      General: She is not in acute distress.    Appearance: She is not diaphoretic.  HENT:     Head: Normocephalic and atraumatic.     Right  Ear: External ear normal.     Left Ear: External ear normal.     Nose: Nose normal.     Mouth/Throat:     Pharynx: No oropharyngeal exudate.  Eyes:     General: No scleral icterus.    Conjunctiva/sclera: Conjunctivae normal.     Pupils: Pupils are equal, round, and reactive to light.  Cardiovascular:     Rate and Rhythm: Normal rate. Rhythm irregular.     Heart sounds: Normal heart sounds. No murmur heard.   No friction rub. No gallop.  Pulmonary:     Effort: Pulmonary effort is normal. No respiratory distress.     Breath sounds: Normal breath sounds. No wheezing, rhonchi or rales.  Abdominal:     General: Bowel sounds are normal. There is no distension.     Palpations: Abdomen is soft.     Tenderness: There is no abdominal tenderness. There is no rebound.  Musculoskeletal:        General: No tenderness. Normal range of motion.     Cervical back: Normal range of motion and neck supple.  Lymphadenopathy:     Cervical: No cervical adenopathy.  Skin:    General: Skin is warm and dry.     Coloration: Skin is not pale.     Findings: No erythema or rash.  Neurological:     General: No focal deficit present.     Mental Status: She is alert and oriented to person, place, and time.     Coordination: Coordination normal.  Psychiatric:        Mood and Affect: Mood normal.        Behavior: Behavior normal.        Thought Content: Thought content normal.        Judgment: Judgment normal.   Line is clean dry and intact November 28, 2020:     Wounds to mechanical fall 03/02/2021:            Assessment & Plan:  86 year old Caucasian lady with history of aortic valve replacement x2 admitted to  the hospital with several weeks of fatigue and found to be in atrial fibrillation rapid ventricular sponsor but with also septic physiology and was found to have actinomyces in the blood.  Work-up included CT scan which showed possible duodenitis.  We did a CT maxillofacial which was  unrevealing and a transesophageal echocardiogram which was negative for vegetations.  She received 6 weeks of penicillin IV and now on oral penicillin.  Actinomyces bacteremia:: Unclear source we will have her follow-up with me in September pushing between 6 months to a year of therapy.  Mechanical fall: She seems to be recovering but does have the wounds pictured above  Continue topical wound care but with dressings that can be changed to that should the wounds can be examined more frequently  Atrial fibrillation with rapid ventricular sponsor on rate control and eliquis

## 2021-03-03 LAB — COMPLETE METABOLIC PANEL WITH GFR
AG Ratio: 1.5 (calc) (ref 1.0–2.5)
ALT: 9 U/L (ref 6–29)
AST: 13 U/L (ref 10–35)
Albumin: 3.7 g/dL (ref 3.6–5.1)
Alkaline phosphatase (APISO): 77 U/L (ref 37–153)
BUN: 9 mg/dL (ref 7–25)
CO2: 26 mmol/L (ref 20–32)
Calcium: 9.3 mg/dL (ref 8.6–10.4)
Chloride: 102 mmol/L (ref 98–110)
Creat: 0.87 mg/dL (ref 0.60–0.95)
Globulin: 2.5 g/dL (calc) (ref 1.9–3.7)
Glucose, Bld: 93 mg/dL (ref 65–99)
Potassium: 5.1 mmol/L (ref 3.5–5.3)
Sodium: 135 mmol/L (ref 135–146)
Total Bilirubin: 1.2 mg/dL (ref 0.2–1.2)
Total Protein: 6.2 g/dL (ref 6.1–8.1)
eGFR: 66 mL/min/{1.73_m2} (ref >=60)

## 2021-03-03 LAB — CBC WITH DIFFERENTIAL/PLATELET
Absolute Monocytes: 822 cells/uL (ref 200–950)
Basophils Absolute: 50 cells/uL (ref 0–200)
Basophils Relative: 0.5 %
Eosinophils Absolute: 89 cells/uL (ref 15–500)
Eosinophils Relative: 0.9 %
HCT: 41.8 % (ref 35.0–45.0)
Hemoglobin: 13.1 g/dL (ref 11.7–15.5)
Lymphs Abs: 1832 cells/uL (ref 850–3900)
MCH: 26.8 pg — ABNORMAL LOW (ref 27.0–33.0)
MCHC: 31.3 g/dL — ABNORMAL LOW (ref 32.0–36.0)
MCV: 85.5 fL (ref 80.0–100.0)
MPV: 9.9 fL (ref 7.5–12.5)
Monocytes Relative: 8.3 %
Neutro Abs: 7108 cells/uL (ref 1500–7800)
Neutrophils Relative %: 71.8 %
Platelets: 249 10*3/uL (ref 140–400)
RBC: 4.89 10*6/uL (ref 3.80–5.10)
RDW: 13.9 % (ref 11.0–15.0)
Total Lymphocyte: 18.5 %
WBC: 9.9 10*3/uL (ref 3.8–10.8)

## 2021-03-03 LAB — C-REACTIVE PROTEIN: CRP: 15.6 mg/L — ABNORMAL HIGH (ref <8.0)

## 2021-03-03 LAB — SEDIMENTATION RATE: Sed Rate: 6 mm/h (ref 0–30)

## 2021-04-06 ENCOUNTER — Telehealth: Payer: Self-pay | Admitting: *Deleted

## 2021-04-06 DIAGNOSIS — A429 Actinomycosis, unspecified: Secondary | ICD-10-CM

## 2021-04-06 MED ORDER — PENICILLIN V POTASSIUM 500 MG PO TABS: 500.0000 mg | ORAL_TABLET | Freq: Four times a day (QID) | ORAL | 4 refills | Status: DC

## 2021-04-06 NOTE — Telephone Encounter (Signed)
Patient's son-in-law [employer] called for refill of penicillin tablets. RN spoke with [facility] who states the prescription was sent in for 15 day supply instead of 30 day.  Patient has been filling penicillin every 2 weeks. RN confirmed with Dr Tommy Medal that the patient is supposed to take the tablets 4 times daily, resent the prescription with #120 instead of #60. RN left message on Robert's voicemail letting him know the problem has been fixed, prescription sent today. RN reminded him of upcoming appointment in October. Landis Gandy, RN

## 2021-06-03 ENCOUNTER — Ambulatory Visit: Payer: Medicare Other | Admitting: Infectious Disease

## 2021-07-03 ENCOUNTER — Other Ambulatory Visit: Payer: Self-pay

## 2021-07-03 ENCOUNTER — Ambulatory Visit (INDEPENDENT_AMBULATORY_CARE_PROVIDER_SITE_OTHER): Payer: Medicare Other

## 2021-07-03 ENCOUNTER — Encounter: Payer: Self-pay | Admitting: Infectious Disease

## 2021-07-03 ENCOUNTER — Ambulatory Visit (INDEPENDENT_AMBULATORY_CARE_PROVIDER_SITE_OTHER): Payer: Medicare Other | Admitting: Infectious Disease

## 2021-07-03 VITALS — BP 121/77 | HR 65 | Temp 97.8°F | Wt 144.0 lb

## 2021-07-03 DIAGNOSIS — A429 Actinomycosis, unspecified: Secondary | ICD-10-CM | POA: Diagnosis present

## 2021-07-03 DIAGNOSIS — Z952 Presence of prosthetic heart valve: Secondary | ICD-10-CM | POA: Diagnosis not present

## 2021-07-03 DIAGNOSIS — Z23 Encounter for immunization: Secondary | ICD-10-CM

## 2021-07-03 MED ORDER — PENICILLIN V POTASSIUM 500 MG PO TABS: 500.0000 mg | ORAL_TABLET | Freq: Four times a day (QID) | ORAL | 5 refills | Status: DC

## 2021-07-03 NOTE — Progress Notes (Signed)
   Covid-19 Vaccination Clinic  Name:  Tracy Buck    MRN: [id number] DOB: Mar 25, 1935  07/03/2021  Tracy Buck was observed post Covid-19 immunization for 15 minutes without incident. She was provided with Vaccine Information Sheet and instruction to access the V-Safe system.   Tracy Buck was instructed to call 911 with any severe reactions post vaccine: Difficulty breathing  Swelling of face and throat  A fast heartbeat  A bad rash all over body  Dizziness and weakness   Immunizations Administered     Name Date Dose VIS Date Route   Pfizer Covid-19 Vaccine Bivalent Booster 07/03/2021  4:31 PM 0.3 mL 04/22/2021 Intramuscular   Manufacturer: [place]   Lot: [id number]   [place]: [place]

## 2021-07-03 NOTE — Progress Notes (Signed)
Subjective:  Chief complaint: Follow-up for actinomyces infection  Patient ID: Tracy Buck, female    DOB: 05/29/1935, 86 y.o.   MRN: [id number]  HPI   86 y.o. 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  CT scan shows possible duodenitis. CT maxillofacial was unrevealing transesophageal echocardiogram was negative for vegetations  He received 6 weeks of IV penicillin was changed over to oral penicillin which has been on since.  She has having trouble tolerating it at all.  There was some concern about some pleural effusion still being present but she is seen by pulmonary and follow-up chest x-ray shows resolution of this process.  Last time I saw her she had suffered a fall and had multiple injuries related to this.  She is now living with her daughter and they have made sure that her bedroom is very close to the bathroom and the lights are on at all times and been [place] no falls whatsoever.  She is tolerating her penicillin well.  I reviewed her CBC and BMP drawn at [facility] and these labs are within normal limits and there is no need for dose adjustment of her penicillin.        Past Medical History:  Diagnosis Date   Arthritis    "fingers, knees" (8/65/7846)   Complication of anesthesia    "I often go into severe panic attacks when they try to bring me out and they have to put me back under; sometimes happens when they are starting the anesthesia also" (09/16/2016)   COPD (chronic obstructive pulmonary disease) ([place])    "I think this has been recently dx'd" (09/16/2016)   Fall 03/02/2021   Forearm laceration 03/02/2021   Forehead abrasion 03/02/2021   Heart valve replaced    Hyperlipidemia    Hypertension    LBBB (left bundle branch block)    Lung cancer ([place]) 2013   Lung  mass    Myocardial infarct ([place])    Panic attack    Pneumonia    "when I was very young" (09/16/2016)   Rib fracture    Skin cancer of nose     Past Surgical History:  Procedure Laterality Date   AORTIC VALVE REPLACEMENT     "bovine valve"   BYPASS GRAFT     does not know if she had CABG (09/16/2016)   [place]     does not know if she had CABG (09/16/2016)   CARDIAC VALVE REPLACEMENT     CARPAL TUNNEL RELEASE Right 1959   CHOLECYSTECTOMY N/A 09/17/2016   Procedure: LAPAROSCOPIC CHOLECYSTECTOMY;  Surgeon: Ralene Ok, MD;  Location: [facility];  Service: General;  Laterality: N/A;   [place] ARTHROSCOPY Left 09/2014   LOBECTOMY Left 2013   "took bottom lobe off"   SKIN CANCER EXCISION     "tip of my nose"   TEE WITHOUT CARDIOVERSION N/A 11/11/2020   Procedure: TRANSESOPHAGEAL ECHOCARDIOGRAM (TEE);  Surgeon: Skeet Latch, MD;  Location: [facility] ENDOSCOPY;  Service: Cardiovascular;  Laterality: N/A;   TONSILLECTOMY      Family History  Problem Relation Age of Onset   Transient ischemic attack Mother    Colon cancer Father       Social History   Socioeconomic History   Marital status: Widowed  Spouse name: Not on file   Number of children: Not on file   Years of education: Not on file   Highest education level: Not on file  Occupational History   Not on file  Tobacco Use   Smoking status: Never   Smokeless tobacco: Never  Substance and Sexual Activity   Alcohol use: Yes    Comment: 09/16/2016 "2-3 glasses of wine when at the beach; once/year"   Drug use: No   Sexual activity: Not Currently    Birth control/protection: Abstinence  Other Topics Concern   Not on file  Social History Narrative   Not on file   Social Determinants of Health   Financial Resource Strain: Not on file  Food Insecurity: Not on file  Transportation Needs: Not on file  Physical Activity: Not on file  Stress: Not on file  Social Connections:  Not on file    Allergies  Allergen Reactions   Fig Extract [Ficus] Anaphylaxis   Citalopram Hydrobromide Other (See Comments)    Pt unsure if this is accurate   Sulfa Antibiotics Other (See Comments)    Severe nervousness.   Prednisone Other (See Comments)    Other reaction(s): Other (See Comments) So nervous I could climb the walls So nervous I could climb the walls      Current Outpatient Medications:    acetaminophen (TYLENOL) 500 MG tablet, Take 500 mg by mouth every 6 (six) hours as needed. For pain., Disp: , Rfl:    albuterol (PROVENTIL HFA;VENTOLIN HFA) 108 (90 BASE) MCG/ACT inhaler, Inhale 2 puffs into the lungs every 6 (six) hours as needed. Patient uses 2 puffs of this medication at 6 am., Disp: , Rfl:    albuterol (PROVENTIL) (2.5 MG/3ML) 0.083% nebulizer solution, Take 2.5 mg by nebulization every 6 (six) hours as needed., Disp: , Rfl:    apixaban (ELIQUIS) 5 MG TABS tablet, Take 1 tablet (5 mg total) by mouth 2 (two) times daily., Disp: 60 tablet, Rfl: 1   calcium carbonate (TITRALAC) 420 MG CHEW, Chew 420 mg by mouth daily as needed for heartburn., Disp: , Rfl:    diltiazem (CARDIZEM CD) 120 MG 24 hr capsule, Take 1 capsule (120 mg total) by mouth daily., Disp: 30 capsule, Rfl: 1   Fluticasone-Salmeterol (ADVAIR) 250-50 MCG/DOSE AEPB, Inhale 1 puff into the lungs every 12 (twelve) hours., Disp: , Rfl:    furosemide (LASIX) 40 MG tablet, Take 1 tablet (40 mg total) by mouth daily., Disp: 30 tablet, Rfl: 1   loratadine (CLARITIN) 10 MG tablet, Take 10 mg by mouth daily., Disp: , Rfl:    penicillin v potassium (VEETID) 500 MG tablet, Take 1 tablet (500 mg total) by mouth 4 (four) times daily., Disp: 120 tablet, Rfl: 4   potassium chloride SA (KLOR-CON) 20 MEQ tablet, Take 20 mEq by mouth 2 (two) times daily., Disp: , Rfl:    rosuvastatin (CRESTOR) 20 MG tablet, Take 20 mg by mouth daily., Disp: , Rfl:    sodium chloride 1 g tablet, Take 1 tablet (1 g total) by mouth 2 (two)  times daily with a meal., Disp: 6 tablet, Rfl: 0   tiotropium (SPIRIVA) 18 MCG inhalation capsule, Place 18 mcg into inhaler and inhale daily., Disp: , Rfl:    traZODone (DESYREL) 50 MG tablet, TAKE 1 TABLET(50 MG) BY MOUTH EVERY NIGHT, Disp: , Rfl:    docusate sodium (COLACE) 100 MG capsule, Take 100 mg by mouth 2 (two) times daily. (Patient not taking: Reported on  07/03/2021), Disp: , Rfl:    fosfomycin (MONUROL) 3 g PACK, First dose today and then another dose in 3 days (Patient not taking: No sig reported), Disp: 6 g, Rfl: 1   zolpidem (AMBIEN) 10 MG tablet, Take 5 mg by mouth at bedtime as needed for sleep. (Patient not taking: No sig reported), Disp: , Rfl:    Review of Systems  Constitutional:  Negative for activity change, appetite change, chills, diaphoresis, fatigue, fever and unexpected weight change.  HENT:  Negative for congestion, rhinorrhea, sinus pressure, sneezing, sore throat and trouble swallowing.   Eyes:  Negative for photophobia and visual disturbance.  Respiratory:  Negative for cough, chest tightness, shortness of breath, wheezing and stridor.   Cardiovascular:  Negative for chest pain, palpitations and leg swelling.  Gastrointestinal:  Negative for abdominal distention, abdominal pain, anal bleeding, blood in stool, constipation, diarrhea, nausea and vomiting.  Genitourinary:  Negative for difficulty urinating, dysuria, flank pain and hematuria.  Musculoskeletal:  Negative for arthralgias, back pain, gait problem, joint swelling and myalgias.  Skin:  Negative for color change, pallor, rash and wound.  Neurological:  Negative for dizziness, tremors, weakness and light-headedness.  Hematological:  Negative for adenopathy. Does not bruise/bleed easily.  Psychiatric/Behavioral:  Negative for agitation, behavioral problems, confusion, decreased concentration, dysphoric mood and sleep disturbance.       Objective:   Physical Exam Constitutional:      General: She is not  in acute distress.    Appearance: Normal appearance. She is well-developed. She is not ill-appearing or diaphoretic.  HENT:     Head: Normocephalic and atraumatic.     Right Ear: Hearing and external ear normal.     Left Ear: Hearing and external ear normal.     Nose: No nasal deformity or rhinorrhea.  Eyes:     General: No scleral icterus.    Conjunctiva/sclera: Conjunctivae normal.     Right eye: Right conjunctiva is not injected.     Left eye: Left conjunctiva is not injected.     Pupils: Pupils are equal, round, and reactive to light.  Neck:     Vascular: No JVD.  Cardiovascular:     Rate and Rhythm: Rhythm irregular.     Heart sounds: S1 normal and S2 normal.  Pulmonary:     Effort: Pulmonary effort is normal. No respiratory distress.     Breath sounds: No wheezing.  Abdominal:     General: There is no distension.  Musculoskeletal:        General: Normal range of motion.     Right shoulder: Normal.     Left shoulder: Normal.     Cervical back: Normal range of motion and neck supple.     Right hip: Normal.     Left hip: Normal.     Right knee: Normal.     Left knee: Normal.  Lymphadenopathy:     Head:     Right side of head: No submandibular, preauricular or posterior auricular adenopathy.     Left side of head: No submandibular, preauricular or posterior auricular adenopathy.     Cervical: No cervical adenopathy.     Right cervical: No superficial or deep cervical adenopathy.    Left cervical: No superficial or deep cervical adenopathy.  Skin:    General: Skin is warm and dry.     Coloration: Skin is not pale.     Findings: No abrasion, bruising, ecchymosis, erythema, lesion or rash.     Nails: There is  no clubbing.  Neurological:     Mental Status: She is alert and oriented to person, place, and time.     Sensory: No sensory deficit.     Coordination: Coordination normal.     Gait: Gait normal.  Psychiatric:        Attention and Perception: She is attentive.         Mood and Affect: Mood normal.        Speech: Speech normal.        Behavior: Behavior normal. Behavior is cooperative.        Thought Content: Thought content normal.        Judgment: Judgment normal.         Assessment & Plan:  86 year old Caucasian lady with history of aortic valve replacement x2 admitted to the hospital with several weeks of fatigue and found to be in atrial fibrillation rapid ventricular sponsor but with also septic physiology and was found to have actinomyces in the blood.  Work-up included CT scan which showed possible duodenitis.  We did a CT maxillofacial which was unrevealing and a transesophageal echocardiogram which was negative for vegetations.  She received 6 weeks of penicillin IV and now on oral penicillin.  Actinomyces bacteremia:  Not clear what the source of this was we will have her complete a year of therapy and she will come back in March 2022 to see me.  I have reviewed her CBC and BMP done at [facility] in October 2022.  I am continuing her penicillin VK 4 times daily  Mechanical fall: She seems to be recovering but does have the wounds pictured above   Atrial fibrillation with rapid ventricular sponsor on rate control and Eliquis.  Vaccine counseling recommended that she get updated COVID-19 booster which was given today.

## 2021-10-28 ENCOUNTER — Other Ambulatory Visit: Payer: Self-pay

## 2021-10-28 ENCOUNTER — Ambulatory Visit (INDEPENDENT_AMBULATORY_CARE_PROVIDER_SITE_OTHER): Payer: Medicare Other | Admitting: Infectious Disease

## 2021-10-28 ENCOUNTER — Encounter: Payer: Self-pay | Admitting: Infectious Disease

## 2021-10-28 VITALS — BP 97/65 | HR 71 | Wt 141.0 lb

## 2021-10-28 DIAGNOSIS — A419 Sepsis, unspecified organism: Secondary | ICD-10-CM | POA: Diagnosis not present

## 2021-10-28 DIAGNOSIS — A429 Actinomycosis, unspecified: Secondary | ICD-10-CM

## 2021-10-28 DIAGNOSIS — Z7185 Encounter for immunization safety counseling: Secondary | ICD-10-CM

## 2021-10-28 DIAGNOSIS — R7881 Bacteremia: Secondary | ICD-10-CM

## 2021-10-28 DIAGNOSIS — Z952 Presence of prosthetic heart valve: Secondary | ICD-10-CM

## 2021-10-28 DIAGNOSIS — I4819 Other persistent atrial fibrillation: Secondary | ICD-10-CM | POA: Diagnosis not present

## 2021-10-28 DIAGNOSIS — N179 Acute kidney failure, unspecified: Secondary | ICD-10-CM | POA: Diagnosis not present

## 2021-10-28 DIAGNOSIS — R6520 Severe sepsis without septic shock: Secondary | ICD-10-CM | POA: Diagnosis not present

## 2021-10-28 HISTORY — DX: Encounter for immunization safety counseling: Z71.85

## 2021-10-28 NOTE — Progress Notes (Signed)
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   MRN: [id number]  HPI   87 y.o. 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  CT scan shows possible duodenitis. CT maxillofacial was unrevealing transesophageal echocardiogram was negative for vegetations  He received 6 weeks of IV penicillin was changed over to oral penicillin which has been on since.  She has had no trouble tolerating it at all.  There was some concern about some pleural effusion still being present but she is seen by pulmonary and follow-up chest x-ray shows resolution of this process.  Several visits ago she had suffered a fall with multiple injuries unmoved and and is now she is now living with her daughter and they have made sure that her bedroom is very close to the bathroom and the lights are on at all times and been [place] no falls whatsoever.  She has now nearing the years worth of therapy that we planned to treat actinomyces infection.  She has been getting regular dental care and there is no significant dental pathology that has been observed by her dentist.          Past Medical History:  Diagnosis Date   Arthritis    "fingers, knees" (1/77/9390)   Complication of anesthesia    "I often go into severe panic attacks when they try to bring me out and they have to put me back under; sometimes happens when they are starting the anesthesia also" (09/16/2016)   COPD (chronic obstructive pulmonary disease) ([place])    "I think this has been recently dx'd" (09/16/2016)   Fall 03/02/2021   Forearm laceration 03/02/2021   Forehead abrasion 03/02/2021   Heart valve replaced    Hyperlipidemia    Hypertension    LBBB (left bundle branch  block)    Lung cancer ([place]) 2013   Lung mass    Myocardial infarct ([place])    Panic attack    Pneumonia    "when I was very young" (09/16/2016)   Rib fracture    Skin cancer of nose     Past Surgical History:  Procedure Laterality Date   AORTIC VALVE REPLACEMENT     "bovine valve"   BYPASS GRAFT     does not know if she had CABG (09/16/2016)   [place]     does not know if she had CABG (09/16/2016)   CARDIAC VALVE REPLACEMENT     CARPAL TUNNEL RELEASE Right 1959   CHOLECYSTECTOMY N/A 09/17/2016   Procedure: LAPAROSCOPIC CHOLECYSTECTOMY;  Surgeon: Ralene Ok, MD;  Location: [facility];  Service: General;  Laterality: N/A;   [place] ARTHROSCOPY Left 09/2014   LOBECTOMY Left 2013   "took bottom lobe off"   SKIN CANCER EXCISION     "tip of my nose"   TEE WITHOUT CARDIOVERSION N/A 11/11/2020   Procedure: TRANSESOPHAGEAL ECHOCARDIOGRAM (TEE);  Surgeon: Skeet Latch, MD;  Location: [facility] ENDOSCOPY;  Service: Cardiovascular;  Laterality: N/A;   TONSILLECTOMY      Family History  Problem Relation Age of Onset   Transient ischemic attack Mother    Colon cancer Father       Social History   Socioeconomic History  Marital status: Widowed    Spouse name: Not on file   Number of children: Not on file   Years of education: Not on file   Highest education level: Not on file  Occupational History   Not on file  Tobacco Use   Smoking status: Never   Smokeless tobacco: Never  Substance and Sexual Activity   Alcohol use: Yes    Comment: 09/16/2016 "2-3 glasses of wine when at the beach; once/year"   Drug use: No   Sexual activity: Not Currently    Birth control/protection: Abstinence  Other Topics Concern   Not on file  Social History Narrative   Not on file   Social Determinants of Health   Financial Resource Strain: Not on file  Food Insecurity: Not on file  Transportation Needs: Not on file  Physical Activity: Not on file   Stress: Not on file  Social Connections: Not on file    Allergies  Allergen Reactions   Fig Extract [Ficus] Anaphylaxis   Citalopram Hydrobromide Other (See Comments)    Pt unsure if this is accurate   Sulfa Antibiotics Other (See Comments)    Severe nervousness.   Prednisone Other (See Comments)    Other reaction(s): Other (See Comments) So nervous I could climb the walls So nervous I could climb the walls      Current Outpatient Medications:    acetaminophen (TYLENOL) 500 MG tablet, Take 500 mg by mouth every 6 (six) hours as needed. For pain., Disp: , Rfl:    albuterol (PROVENTIL HFA;VENTOLIN HFA) 108 (90 BASE) MCG/ACT inhaler, Inhale 2 puffs into the lungs every 6 (six) hours as needed. Patient uses 2 puffs of this medication at 6 am., Disp: , Rfl:    albuterol (PROVENTIL) (2.5 MG/3ML) 0.083% nebulizer solution, Take 2.5 mg by nebulization every 6 (six) hours as needed., Disp: , Rfl:    apixaban (ELIQUIS) 5 MG TABS tablet, Take 1 tablet (5 mg total) by mouth 2 (two) times daily., Disp: 60 tablet, Rfl: 1   calcium carbonate (TITRALAC) 420 MG CHEW, Chew 420 mg by mouth daily as needed for heartburn., Disp: , Rfl:    diltiazem (CARDIZEM CD) 120 MG 24 hr capsule, Take 1 capsule (120 mg total) by mouth daily., Disp: 30 capsule, Rfl: 1   Fluticasone-Salmeterol (ADVAIR) 250-50 MCG/DOSE AEPB, Inhale 1 puff into the lungs every 12 (twelve) hours., Disp: , Rfl:    furosemide (LASIX) 40 MG tablet, Take 1 tablet (40 mg total) by mouth daily., Disp: 30 tablet, Rfl: 1   loratadine (CLARITIN) 10 MG tablet, Take 10 mg by mouth daily., Disp: , Rfl:    potassium chloride SA (KLOR-CON) 20 MEQ tablet, Take 20 mEq by mouth 2 (two) times daily., Disp: , Rfl:    rosuvastatin (CRESTOR) 20 MG tablet, Take 20 mg by mouth daily., Disp: , Rfl:    sodium chloride 1 g tablet, Take 1 tablet (1 g total) by mouth 2 (two) times daily with a meal., Disp: 6 tablet, Rfl: 0   tiotropium (SPIRIVA) 18 MCG inhalation  capsule, Place 18 mcg into inhaler and inhale daily., Disp: , Rfl:    traZODone (DESYREL) 50 MG tablet, TAKE 1 TABLET(50 MG) BY MOUTH EVERY NIGHT, Disp: , Rfl:    docusate sodium (COLACE) 100 MG capsule, Take 100 mg by mouth 2 (two) times daily. (Patient not taking: Reported on 07/03/2021), Disp: , Rfl:    fosfomycin (MONUROL) 3 g PACK, First dose today and then another dose in  3 days (Patient not taking: Reported on 12/15/2020), Disp: 6 g, Rfl: 1   zolpidem (AMBIEN) 10 MG tablet, Take 5 mg by mouth at bedtime as needed for sleep. (Patient not taking: Reported on 03/02/2021), Disp: , Rfl:    Review of Systems  Constitutional:  Negative for activity change, appetite change, chills, diaphoresis, fatigue, fever and unexpected weight change.  HENT:  Negative for congestion, rhinorrhea, sinus pressure, sneezing, sore throat and trouble swallowing.   Eyes:  Negative for photophobia and visual disturbance.  Respiratory:  Negative for cough, chest tightness, shortness of breath, wheezing and stridor.   Cardiovascular:  Negative for chest pain, palpitations and leg swelling.  Gastrointestinal:  Negative for abdominal distention, abdominal pain, anal bleeding, blood in stool, constipation, diarrhea, nausea and vomiting.  Genitourinary:  Negative for difficulty urinating, dysuria, flank pain and hematuria.  Musculoskeletal:  Negative for arthralgias, back pain, gait problem, joint swelling and myalgias.  Skin:  Negative for color change, pallor, rash and wound.  Neurological:  Negative for dizziness, tremors, weakness and light-headedness.  Hematological:  Negative for adenopathy. Does not bruise/bleed easily.  Psychiatric/Behavioral:  Negative for agitation, behavioral problems, confusion, decreased concentration, dysphoric mood and sleep disturbance.       Objective:   Physical Exam Constitutional:      General: She is not in acute distress.    Appearance: She is not diaphoretic.  HENT:     Head:  Normocephalic and atraumatic.     Right Ear: External ear normal.     Left Ear: External ear normal.     Nose: Nose normal.     Mouth/Throat:     Pharynx: No oropharyngeal exudate.  Eyes:     General: No scleral icterus.       Right eye: No discharge.        Left eye: No discharge.     Extraocular Movements: Extraocular movements intact.     Conjunctiva/sclera: Conjunctivae normal.  Cardiovascular:     Rate and Rhythm: Normal rate and regular rhythm.     Heart sounds:    No friction rub.  Pulmonary:     Effort: Pulmonary effort is normal. No respiratory distress.     Breath sounds: No wheezing or rales.  Abdominal:     General: There is no distension.     Palpations: Abdomen is soft.     Tenderness: There is no rebound.  Musculoskeletal:        General: No tenderness. Normal range of motion.     Cervical back: Normal range of motion and neck supple.  Lymphadenopathy:     Cervical: No cervical adenopathy.  Skin:    General: Skin is warm and dry.     Coloration: Skin is not jaundiced or pale.     Findings: No erythema, lesion or rash.  Neurological:     General: No focal deficit present.     Mental Status: She is alert and oriented to person, place, and time.     Coordination: Coordination normal.  Psychiatric:        Mood and Affect: Mood normal.        Behavior: Behavior normal.        Thought Content: Thought content normal.        Judgment: Judgment normal.         Assessment & Plan:  87 year old Caucasian lady with history of aortic valve replacement x2 admitted to the hospital with several weeks of fatigue and found to be in atrial  fibrillation rapid ventricular sponsor but with also septic physiology and was found to have actinomyces in the blood.  Work-up included CT scan which showed possible duodenitis.  We did a CT maxillofacial which was unrevealing and a transesophageal echocardiogram which was negative for vegetations.  She received 6 weeks of penicillin IV  and now on oral penicillin having reached nearly a year of therapy  Actinomyces bacteremia: Not clear what the source of this was but we are having her complete her year of therapy and then she can come back as needed I am checking a sed rate CRP CBC and BMP but I expect these did not show any evidence of significant inflammation.  Atrial fibrillation with history of rapid ventricular response on rate control and Eliquis vaccine counseling recommended Prevnar 20 if she has not received this.  Atrial fibrillation with rapid ventricular sponsor on rate control and Eliquis.  Vaccine counseling recommended that she get updated COVID-19 booster which was given today.

## 2021-10-29 LAB — CBC WITH DIFFERENTIAL/PLATELET
Absolute Monocytes: 874 cells/uL (ref 200–950)
Basophils Absolute: 76 cells/uL (ref 0–200)
Basophils Relative: 0.8 %
Eosinophils Absolute: 437 cells/uL (ref 15–500)
Eosinophils Relative: 4.6 %
HCT: 41.8 % (ref 35.0–45.0)
Hemoglobin: 14 g/dL (ref 11.7–15.5)
Lymphs Abs: 1948 cells/uL (ref 850–3900)
MCH: 29 pg (ref 27.0–33.0)
MCHC: 33.5 g/dL (ref 32.0–36.0)
MCV: 86.7 fL (ref 80.0–100.0)
MPV: 10.1 fL (ref 7.5–12.5)
Monocytes Relative: 9.2 %
Neutro Abs: 6166 cells/uL (ref 1500–7800)
Neutrophils Relative %: 64.9 %
Platelets: 229 10*3/uL (ref 140–400)
RBC: 4.82 10*6/uL (ref 3.80–5.10)
RDW: 13.9 % (ref 11.0–15.0)
Total Lymphocyte: 20.5 %
WBC: 9.5 10*3/uL (ref 3.8–10.8)

## 2021-10-29 LAB — BASIC METABOLIC PANEL WITH GFR
BUN/Creatinine Ratio: 19 (calc) (ref 6–22)
BUN: 20 mg/dL (ref 7–25)
CO2: 29 mmol/L (ref 20–32)
Calcium: 9.2 mg/dL (ref 8.6–10.4)
Chloride: 97 mmol/L — ABNORMAL LOW (ref 98–110)
Creat: 1.06 mg/dL — ABNORMAL HIGH (ref 0.60–0.95)
Glucose, Bld: 114 mg/dL — ABNORMAL HIGH (ref 65–99)
Potassium: 3.3 mmol/L — ABNORMAL LOW (ref 3.5–5.3)
Sodium: 137 mmol/L (ref 135–146)
eGFR: 52 mL/min/{1.73_m2} — ABNORMAL LOW (ref >=60)

## 2021-10-29 LAB — SEDIMENTATION RATE: Sed Rate: 2 mm/h (ref 0–30)

## 2021-10-29 LAB — C-REACTIVE PROTEIN: CRP: 14.4 mg/L — ABNORMAL HIGH (ref <8.0)

## 2021-12-06 IMAGING — DX DG CHEST 2V
2 series · 2 of 2 positions shown · non-contrast
Comparison: November 12, 2020

CLINICAL DATA: Follow-up pleural effusion

EXAM:
CHEST - 2 VIEW

[chest pa]
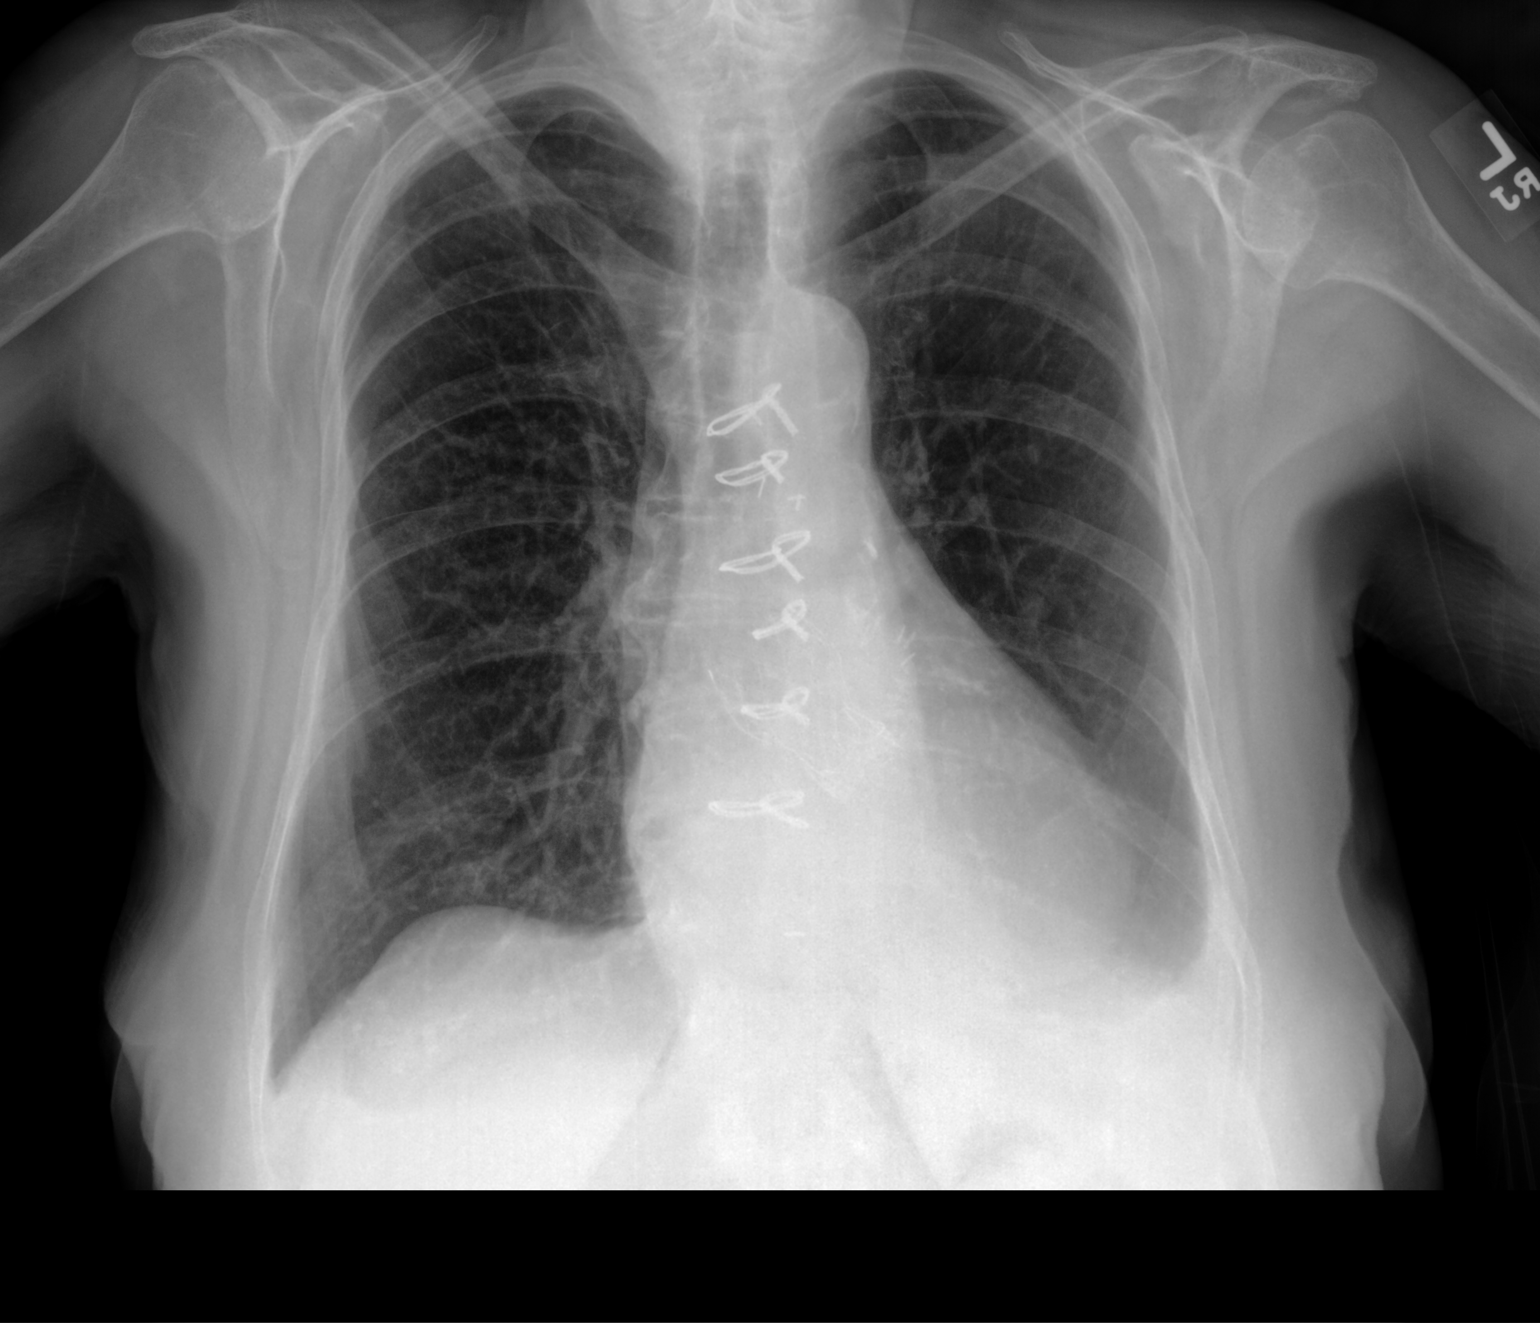

[chest lat]
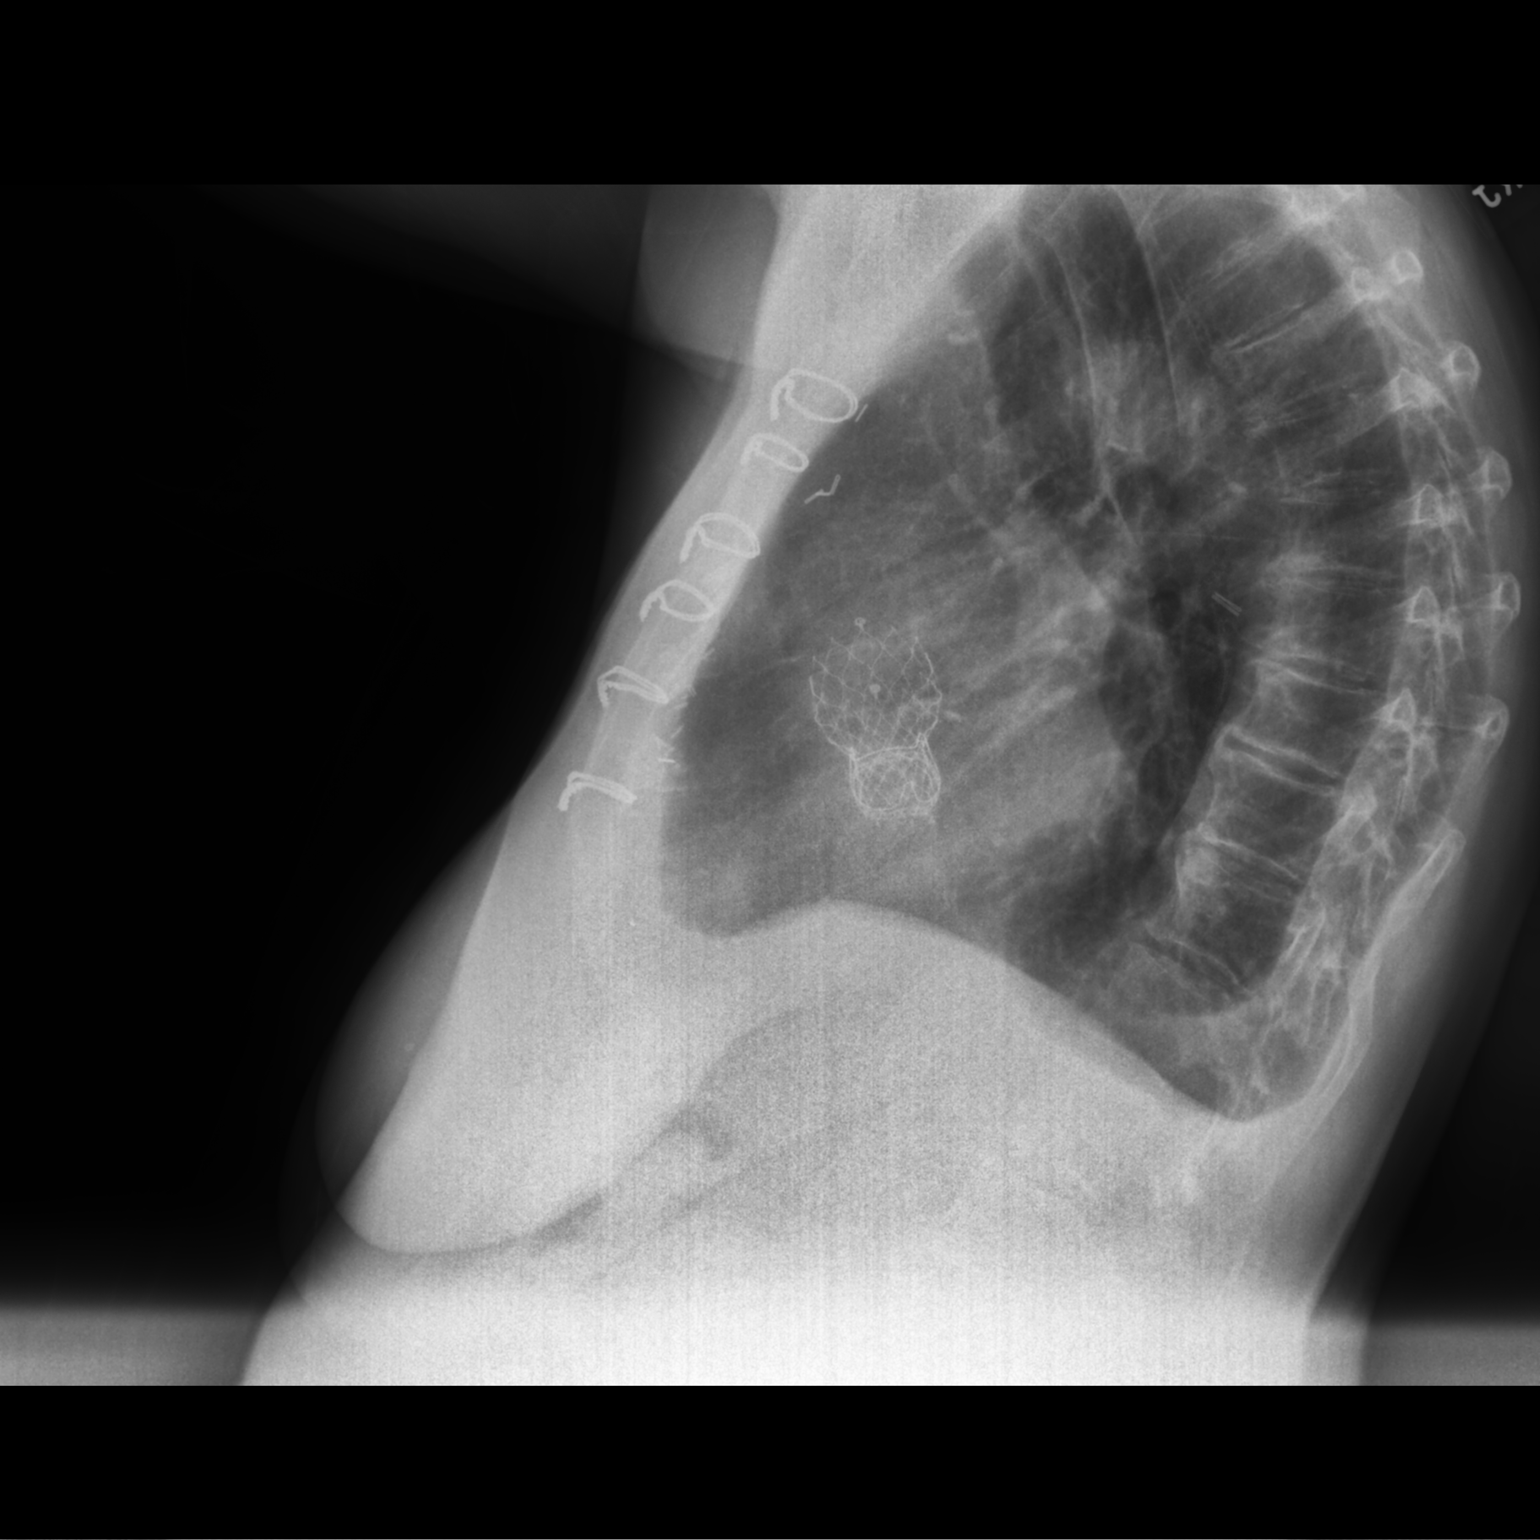

[2 of 2 positions shown; findings below may reference images not displayed]

FINDINGS: Changes of prior median sternotomy and TAVR. The heart size and
mediastinal contours are within normal limits. Both lungs are clear.
Small left pleural effusion, decreased from prior. Trace right-sided
pleural effusion, decreased from prior. No visible pneumothorax. The
visualized skeletal structures are unremarkable.
IMPRESSION: Decreased size of the small left and trace right pleural effusions.

## 2024-08-06 ENCOUNTER — Encounter (HOSPITAL_BASED_OUTPATIENT_CLINIC_OR_DEPARTMENT_OTHER): Payer: Self-pay | Admitting: Emergency Medicine

## 2024-08-06 ENCOUNTER — Emergency Department (HOSPITAL_BASED_OUTPATIENT_CLINIC_OR_DEPARTMENT_OTHER)

## 2024-08-06 ENCOUNTER — Other Ambulatory Visit: Payer: Self-pay

## 2024-08-06 DIAGNOSIS — J449 Chronic obstructive pulmonary disease, unspecified: Secondary | ICD-10-CM | POA: Diagnosis present

## 2024-08-06 DIAGNOSIS — Z7901 Long term (current) use of anticoagulants: Secondary | ICD-10-CM

## 2024-08-06 DIAGNOSIS — Z681 Body mass index (BMI) 19 or less, adult: Secondary | ICD-10-CM

## 2024-08-06 DIAGNOSIS — I5033 Acute on chronic diastolic (congestive) heart failure: Secondary | ICD-10-CM | POA: Diagnosis present

## 2024-08-06 DIAGNOSIS — F05 Delirium due to known physiological condition: Secondary | ICD-10-CM | POA: Diagnosis not present

## 2024-08-06 DIAGNOSIS — E876 Hypokalemia: Secondary | ICD-10-CM | POA: Diagnosis present

## 2024-08-06 DIAGNOSIS — Z91018 Allergy to other foods: Secondary | ICD-10-CM

## 2024-08-06 DIAGNOSIS — Z953 Presence of xenogenic heart valve: Secondary | ICD-10-CM

## 2024-08-06 DIAGNOSIS — I110 Hypertensive heart disease with heart failure: Principal | ICD-10-CM | POA: Diagnosis present

## 2024-08-06 DIAGNOSIS — R627 Adult failure to thrive: Secondary | ICD-10-CM | POA: Diagnosis present

## 2024-08-06 DIAGNOSIS — Z882 Allergy status to sulfonamides status: Secondary | ICD-10-CM

## 2024-08-06 DIAGNOSIS — I447 Left bundle-branch block, unspecified: Secondary | ICD-10-CM | POA: Diagnosis present

## 2024-08-06 DIAGNOSIS — Z888 Allergy status to other drugs, medicaments and biological substances status: Secondary | ICD-10-CM

## 2024-08-06 DIAGNOSIS — Z9049 Acquired absence of other specified parts of digestive tract: Secondary | ICD-10-CM

## 2024-08-06 DIAGNOSIS — Z79899 Other long term (current) drug therapy: Secondary | ICD-10-CM

## 2024-08-06 DIAGNOSIS — F03A2 Unspecified dementia, mild, with psychotic disturbance: Secondary | ICD-10-CM | POA: Diagnosis present

## 2024-08-06 DIAGNOSIS — Z85118 Personal history of other malignant neoplasm of bronchus and lung: Secondary | ICD-10-CM

## 2024-08-06 DIAGNOSIS — I252 Old myocardial infarction: Secondary | ICD-10-CM

## 2024-08-06 DIAGNOSIS — Z7951 Long term (current) use of inhaled steroids: Secondary | ICD-10-CM

## 2024-08-06 DIAGNOSIS — R443 Hallucinations, unspecified: Secondary | ICD-10-CM | POA: Diagnosis not present

## 2024-08-06 DIAGNOSIS — E785 Hyperlipidemia, unspecified: Secondary | ICD-10-CM | POA: Diagnosis present

## 2024-08-06 DIAGNOSIS — Z751 Person awaiting admission to adequate facility elsewhere: Secondary | ICD-10-CM

## 2024-08-06 DIAGNOSIS — Z951 Presence of aortocoronary bypass graft: Secondary | ICD-10-CM

## 2024-08-06 DIAGNOSIS — Z902 Acquired absence of lung [part of]: Secondary | ICD-10-CM

## 2024-08-06 DIAGNOSIS — Z85828 Personal history of other malignant neoplasm of skin: Secondary | ICD-10-CM

## 2024-08-06 DIAGNOSIS — Z66 Do not resuscitate: Secondary | ICD-10-CM | POA: Diagnosis present

## 2024-08-06 DIAGNOSIS — J918 Pleural effusion in other conditions classified elsewhere: Secondary | ICD-10-CM | POA: Diagnosis present

## 2024-08-06 DIAGNOSIS — I4821 Permanent atrial fibrillation: Secondary | ICD-10-CM | POA: Diagnosis present

## 2024-08-06 DIAGNOSIS — I2510 Atherosclerotic heart disease of native coronary artery without angina pectoris: Secondary | ICD-10-CM | POA: Diagnosis present

## 2024-08-06 LAB — CBC
HCT: 32 % — ABNORMAL LOW (ref 36.0–46.0)
Hemoglobin: 9.6 g/dL — ABNORMAL LOW (ref 12.0–15.0)
MCH: 21.1 pg — ABNORMAL LOW (ref 26.0–34.0)
MCHC: 30 g/dL (ref 30.0–36.0)
MCV: 70.2 fL — ABNORMAL LOW (ref 80.0–100.0)
Platelets: 278 K/uL (ref 150–400)
RBC: 4.56 MIL/uL (ref 3.87–5.11)
RDW: 19.1 % — ABNORMAL HIGH (ref 11.5–15.5)
WBC: 7.1 K/uL (ref 4.0–10.5)
nRBC: 0 % (ref 0.0–0.2)

## 2024-08-06 LAB — BASIC METABOLIC PANEL WITH GFR
Anion gap: 13 (ref 5–15)
BUN: 13 mg/dL (ref 8–23)
CO2: 28 mmol/L (ref 22–32)
Calcium: 9.1 mg/dL (ref 8.9–10.3)
Chloride: 95 mmol/L — ABNORMAL LOW (ref 98–111)
Creatinine, Ser: 1.25 mg/dL — ABNORMAL HIGH (ref 0.44–1.00)
GFR, Estimated: 42 mL/min — ABNORMAL LOW (ref >60)
Glucose, Bld: 122 mg/dL — ABNORMAL HIGH (ref 70–99)
Potassium: 2.8 mmol/L — ABNORMAL LOW (ref 3.5–5.1)
Sodium: 136 mmol/L (ref 135–145)

## 2024-08-06 LAB — PRO BRAIN NATRIURETIC PEPTIDE: Pro Brain Natriuretic Peptide: 6114 pg/mL — ABNORMAL HIGH (ref <300.0)

## 2024-08-06 NOTE — ED Triage Notes (Signed)
 Pt with daughter- pt with BLLE swelling this evening.   Pt reports shob, states its about the same as normal, daughter feels it is worse.

## 2024-08-07 ENCOUNTER — Inpatient Hospital Stay (HOSPITAL_BASED_OUTPATIENT_CLINIC_OR_DEPARTMENT_OTHER)
Admission: EM | Admit: 2024-08-07 | Discharge: 2024-08-13 | DRG: 291 | Disposition: A | Discharge status: Skilled Nursing Facility | Attending: Internal Medicine | Admitting: Internal Medicine

## 2024-08-07 DIAGNOSIS — I5033 Acute on chronic diastolic (congestive) heart failure: Secondary | ICD-10-CM

## 2024-08-07 DIAGNOSIS — I5031 Acute diastolic (congestive) heart failure: Secondary | ICD-10-CM | POA: Diagnosis present

## 2024-08-07 DIAGNOSIS — E785 Hyperlipidemia, unspecified: Secondary | ICD-10-CM | POA: Diagnosis present

## 2024-08-07 DIAGNOSIS — J449 Chronic obstructive pulmonary disease, unspecified: Secondary | ICD-10-CM | POA: Diagnosis present

## 2024-08-07 DIAGNOSIS — I4819 Other persistent atrial fibrillation: Secondary | ICD-10-CM | POA: Diagnosis present

## 2024-08-07 DIAGNOSIS — E876 Hypokalemia: Secondary | ICD-10-CM | POA: Insufficient documentation

## 2024-08-07 DIAGNOSIS — I509 Heart failure, unspecified: Principal | ICD-10-CM

## 2024-08-07 DIAGNOSIS — R0609 Other forms of dyspnea: Secondary | ICD-10-CM

## 2024-08-07 DIAGNOSIS — Z951 Presence of aortocoronary bypass graft: Secondary | ICD-10-CM

## 2024-08-07 DIAGNOSIS — Z952 Presence of prosthetic heart valve: Secondary | ICD-10-CM

## 2024-08-07 DIAGNOSIS — R410 Disorientation, unspecified: Secondary | ICD-10-CM

## 2024-08-07 LAB — MAGNESIUM: Magnesium: 1.9 mg/dL (ref 1.7–2.4)

## 2024-08-07 LAB — URINALYSIS, MICROSCOPIC (REFLEX)

## 2024-08-07 LAB — COMPREHENSIVE METABOLIC PANEL WITH GFR
ALT: 10 U/L (ref 0–44)
AST: 26 U/L (ref 15–41)
Albumin: 3.7 g/dL (ref 3.5–5.0)
Alkaline Phosphatase: 78 U/L (ref 38–126)
Anion gap: 11 (ref 5–15)
BUN: 11 mg/dL (ref 8–23)
CO2: 29 mmol/L (ref 22–32)
Calcium: 9.1 mg/dL (ref 8.9–10.3)
Chloride: 99 mmol/L (ref 98–111)
Creatinine, Ser: 1.21 mg/dL — ABNORMAL HIGH (ref 0.44–1.00)
GFR, Estimated: 43 mL/min — ABNORMAL LOW (ref >60)
Glucose, Bld: 135 mg/dL — ABNORMAL HIGH (ref 70–99)
Potassium: 2.8 mmol/L — ABNORMAL LOW (ref 3.5–5.1)
Sodium: 138 mmol/L (ref 135–145)
Total Bilirubin: 1 mg/dL (ref 0.0–1.2)
Total Protein: 6.1 g/dL — ABNORMAL LOW (ref 6.5–8.1)

## 2024-08-07 LAB — URINALYSIS, ROUTINE W REFLEX MICROSCOPIC
Bilirubin Urine: NEGATIVE
Glucose, UA: NEGATIVE mg/dL
Ketones, ur: NEGATIVE mg/dL
Nitrite: NEGATIVE
Protein, ur: NEGATIVE mg/dL
Specific Gravity, Urine: 1.015 (ref 1.005–1.030)
pH: 6.5 (ref 5.0–8.0)

## 2024-08-07 MED ORDER — POTASSIUM CHLORIDE CRYS ER 20 MEQ PO TBCR: 40.0000 meq | EXTENDED_RELEASE_TABLET | Freq: Two times a day (BID) | ORAL | Status: DC

## 2024-08-07 MED ORDER — POTASSIUM CHLORIDE CRYS ER 20 MEQ PO TBCR
40.0000 meq | EXTENDED_RELEASE_TABLET | Freq: Once | ORAL | Status: AC
  Administered 2024-08-08: 40 meq via ORAL
  Filled 2024-08-07: qty 2

## 2024-08-07 MED ORDER — ROSUVASTATIN CALCIUM 20 MG PO TABS
20.0000 mg | ORAL_TABLET | Freq: Every day | ORAL | Status: DC
  Administered 2024-08-07 – 2024-08-13 (×7): 20 mg via ORAL
  Filled 2024-08-07 (×7): qty 1

## 2024-08-07 MED ORDER — POTASSIUM CHLORIDE CRYS ER 20 MEQ PO TBCR
40.0000 meq | EXTENDED_RELEASE_TABLET | Freq: Two times a day (BID) | ORAL | Status: AC
  Administered 2024-08-07 – 2024-08-08 (×2): 40 meq via ORAL
  Filled 2024-08-07 (×2): qty 2

## 2024-08-07 MED ORDER — POTASSIUM CHLORIDE CRYS ER 20 MEQ PO TBCR: 20.0000 meq | EXTENDED_RELEASE_TABLET | Freq: Two times a day (BID) | ORAL | Status: DC

## 2024-08-07 MED ORDER — ONDANSETRON HCL 4 MG/2ML IJ SOLN
4.0000 mg | Freq: Four times a day (QID) | INTRAMUSCULAR | Status: DC | PRN
  Administered 2024-08-07: 22:00:00 4 mg via INTRAVENOUS
  Filled 2024-08-07: qty 2

## 2024-08-07 MED ORDER — LEVALBUTEROL HCL 0.63 MG/3ML IN NEBU: 0.6300 mg | INHALATION_SOLUTION | Freq: Four times a day (QID) | RESPIRATORY_TRACT | Status: DC | PRN

## 2024-08-07 MED ORDER — DILTIAZEM HCL ER COATED BEADS 120 MG PO CP24
120.0000 mg | ORAL_CAPSULE | Freq: Every day | ORAL | Status: DC
  Administered 2024-08-08 – 2024-08-13 (×6): 120 mg via ORAL
  Filled 2024-08-07 (×6): qty 1

## 2024-08-07 MED ORDER — FUROSEMIDE 10 MG/ML IJ SOLN
40.0000 mg | Freq: Every day | INTRAMUSCULAR | Status: DC
  Filled 2024-08-07: qty 4

## 2024-08-07 MED ORDER — POTASSIUM CHLORIDE 20 MEQ PO PACK
40.0000 meq | PACK | Freq: Once | ORAL | Status: AC
  Administered 2024-08-07: 01:00:00 40 meq via ORAL
  Filled 2024-08-07: qty 2

## 2024-08-07 MED ORDER — ONDANSETRON HCL 4 MG PO TABS: 4.0000 mg | ORAL_TABLET | Freq: Four times a day (QID) | ORAL | Status: DC | PRN

## 2024-08-07 MED ORDER — APIXABAN 5 MG PO TABS
5.0000 mg | ORAL_TABLET | Freq: Two times a day (BID) | ORAL | Status: DC
  Administered 2024-08-07 (×2): 5 mg via ORAL
  Filled 2024-08-07: qty 1
  Filled 2024-08-07: qty 2

## 2024-08-07 MED ORDER — FUROSEMIDE 10 MG/ML IJ SOLN
40.0000 mg | Freq: Once | INTRAMUSCULAR | Status: AC
  Administered 2024-08-07: 01:00:00 40 mg via INTRAVENOUS
  Filled 2024-08-07: qty 4

## 2024-08-07 NOTE — Plan of Care (Signed)
   Problem: Health Behavior/Discharge Planning: Goal: Ability to manage health-related needs will improve Outcome: Not Progressing

## 2024-08-07 NOTE — Assessment & Plan Note (Signed)
 2D ECHO 10/2023 w/ EF 50-55%  Noted orthopnea, PND, LE swelling x roughly 1 month- suspect likely diet related in setting of Thanksgiving holiday around onset of symptoms ProBNP 6000 CXR w/ small R pleural effusion  S/p IV lasix  in the ER  Will continue w/ gentle diuresis  Strict Is and Os and daily weights  2D ECHO  Consult cardiology as appropriate

## 2024-08-07 NOTE — ED Notes (Signed)
 Tele-hospitalist at bedside performing assessment now.

## 2024-08-07 NOTE — Progress Notes (Signed)
 Hospitalist Transfer Note:    Nursing staff, Please call [facility] Admits & Consults System-Wide number on Amion [phone]) as soon as patient's arrival, so appropriate admitting provider can evaluate the pt.   Transferring facility: [facility] Requesting provider: Dr. Theadore (EDP at [facility]) Reason for transfer: admission for further evaluation and management of suspected acute on chronic diastolic heart failure.   89 year old female with history of dementia, aortic stenosis status post TAVR, moderate to severe mitral regurgitation, chronic diastolic heart failure, who presented to [facility] ED complaining of worsening edema in the bilateral lower extremities.   In the setting of a reported history of mild dementia, the following history is provided by the patient as well as the patient's family, who were present at [facility] this evening.  Over the last 1 to 2 weeks, the patient has developed worsening swelling in her bilateral lower extremities, associated with some new orthopnea and worsening dyspnea on exertion over that timeframe, family has noted the patient to be short of breath with ambulation within their house, including as the patient ambulates to the dinner table from 1-2 rooms over.  Not associate with any chest pain.  Will patient's family conveys that the patient's dementia is mild in nature, they note that at baseline, she will frequently experience sundowning, even at home.   Her cardiac history is notable for severe aortic stenosis status post TAVR, moderate to severe mitral regurgitation, as well as chronic diastolic heart failure.  She underwent echocardiogram in January 2018, which showed grade 2 diastolic dysfunction.  Most recent TTE occurred in March 2022 and was notable for LVEF 50 to 55% as well as indeterminate diastolic parameters.  Vital signs in the ED were notable for the following: Afebrile; systolic pressures in the 120s mmHg; oxygen saturation in the  mid to high 90s on room air.  Labs were notable for BMP notable for potassium of 2.8.  Imaging notable for chest x-ray showed a small right pleural effusion, but otherwise demonstrated no evidence of acute cardiopulmonary process.  Subsequently, I accepted this patient for transfer for observation admission to a med/tele bed at [facility]  for further work-up and management of the above.      Eva Pore, DO Hospitalist

## 2024-08-07 NOTE — Assessment & Plan Note (Signed)
 Cont home [facility] 

## 2024-08-07 NOTE — Assessment & Plan Note (Signed)
 History of bioprosthetic aortic valve prosthetic valve stenosis, now s/p TAVR  had a 21mm Magna valve implanted in 2007, which degenerated and became stenotic.   On 05/01/2020, she underwent TAVR  Followed by [facility]  On Eliquis 

## 2024-08-07 NOTE — Assessment & Plan Note (Signed)
 K 2.8 on presentation w/ noted active diuresis w/ lasix   S/p oral and IV lasix   Replete  Check Mg level  Follow

## 2024-08-07 NOTE — Plan of Care (Addendum)
 Virtual admission for CHF, hypokalemia,chart and labs reviewed, patient was seen, patient appears comfortable , NAD. Brayton Lye MD

## 2024-08-07 NOTE — H&P (Signed)
 [facility] History and Physical   Referring Provider: Ozell Poisson MD  Telemedicine Provider: Elspeth Masters MD  Provider Location: [facility]  Patient Location: [facility]  Referring Diagnosis: Acute on chronic HFpEF  Patient Name and DOB verified: Yes  Patient consented to Telemedicine Evaluation:Yes RN virtual assistant: Darryle Lunger RN  Video encounter time and date:08/07/24 1520   Additional Information:  Transfusion: No  CPAP/BIPAP: no  O2:no  Foley: no    Patient: Tracy Buck:[id number] DOB: [birth year] DOA: 08/07/2024 DOS: the patient was seen and examined on 08/07/2024 PCP: Ilda Cadet, PA-C  Patient coming from: Home  Chief Complaint:  Chief Complaint  Patient presents with   Leg Swelling   HPI: Tracy Buck is a 89 y.o. female with medical history significant of chronic HFpEF, HTN, HLD, COPD, CAD s/p CABG, TAVR presenting with acute on chronic HFpEF.  History from patient as well as family at the bedside.  Per report, patient with increased work of breathing as well as lower extremity swelling for the past 4 weeks.  Noted to be followed by [facility] cardiology.  Family reports that patient eats a diet of what ever she desires.  May have had excessive salt intake associated with Thanksgiving roughly 1 month ago.  Has had progressive worsening orthopnea as well as PND.  Worsening lower extremity swelling.  Has been taking home Lasix  still with minimal improvement.  No chest pain.  No cough.  Mild shortness of breath.  No wheezing.  No increased sputum production in the setting of baseline COPD.  No abdominal pain or diarrhea.  Family denies any recent NSAID intake.  Family denies any history of chronic high salt intake associated with diet.  No recent medication changes.  Family.  Has shortness of breath with patient for like she was being smothered at times. Presented to the ER afebrile, heart rate 60s to 70s, respiration 20s, blood pressure 120s to  140s over 30s to 50s.  Satting well on room air.  White count 7.1, hemoglobin 9.6, platelets 278, creatinine 1.25.  Potassium 2.8.  proBNP of 6114.CXR w/ small R pleural effusion.  Review of Systems: As mentioned in the history of present illness. All other systems reviewed and are negative. Past Medical History:  Diagnosis Date   Arthritis    fingers, knees (09/16/2016)   Complication of anesthesia    I often go into severe panic attacks when they try to bring me out and they have to put me back under; sometimes happens when they are starting the anesthesia also (09/16/2016)   COPD (chronic obstructive pulmonary disease) (HCC)    I think this has been recently dx'd (09/16/2016)   Fall 03/02/2021   Forearm laceration 03/02/2021   Forehead abrasion 03/02/2021   Heart valve replaced    Hyperlipidemia    Hypertension    LBBB (left bundle branch block)    Lung cancer (HCC) 2013   Lung mass    Myocardial infarct (HCC)    Panic attack    Pneumonia    when I was very young (09/16/2016)   Rib fracture    Skin cancer of nose    Vaccine counseling 10/28/2021   Past Surgical History:  Procedure Laterality Date   AORTIC VALVE REPLACEMENT     bovine valve   BYPASS GRAFT     does not know if she had CABG (09/16/2016)   CARDIAC SURGERY     does not know if she had CABG (09/16/2016)  CARDIAC VALVE REPLACEMENT     CARPAL TUNNEL RELEASE Right 1959   CHOLECYSTECTOMY N/A 09/17/2016   Procedure: LAPAROSCOPIC CHOLECYSTECTOMY;  Surgeon: Lynda Leos, MD;  Location: [facility] OR;  Service: General;  Laterality: N/A;   DILATION AND CURETTAGE OF UTERUS  1963   KNEE ARTHROSCOPY Left 09/2014   LOBECTOMY Left 2013   took bottom lobe off   SKIN CANCER EXCISION     tip of my nose   TEE WITHOUT CARDIOVERSION N/A 11/11/2020   Procedure: TRANSESOPHAGEAL ECHOCARDIOGRAM (TEE);  Surgeon: Raford Riggs, MD;  Location: [facility] ENDOSCOPY;  Service: Cardiovascular;  Laterality: N/A;   TONSILLECTOMY     Social  History:  reports that she has never smoked. She has never used smokeless tobacco. She reports current alcohol use. She reports that she does not use drugs.  Allergies[1]  Family History  Problem Relation Age of Onset   Transient ischemic attack Mother    Colon cancer Father     Prior to Admission medications  Medication Sig Start Date End Date Taking? Authorizing Provider  acetaminophen  (TYLENOL ) 500 MG tablet Take 500 mg by mouth every 6 (six) hours as needed. For pain.    [provider]  albuterol  (PROVENTIL  HFA;VENTOLIN  HFA) 108 (90 BASE) MCG/ACT inhaler Inhale 2 puffs into the lungs every 6 (six) hours as needed. Patient uses 2 puffs of this medication at 6 am.    [provider]  albuterol  (PROVENTIL ) (2.5 MG/3ML) 0.083% nebulizer solution Take 2.5 mg by nebulization every 6 (six) hours as needed.    [provider]  apixaban  (ELIQUIS ) 5 MG TABS tablet Take 1 tablet (5 mg total) by mouth 2 (two) times daily. 11/17/20   Leotis Bogus, MD  calcium  carbonate (TITRALAC) 420 MG CHEW Chew 420 mg by mouth daily as needed for heartburn.    [provider]  diltiazem  (CARDIZEM  CD) 120 MG 24 hr capsule Take 1 capsule (120 mg total) by mouth daily. 11/18/20   Leotis Bogus, MD  docusate sodium  (COLACE) 100 MG capsule Take 100 mg by mouth 2 (two) times daily. Patient not taking: Reported on 07/03/2021    [provider]  Fluticasone -Salmeterol (ADVAIR) 250-50 MCG/DOSE AEPB Inhale 1 puff into the lungs every 12 (twelve) hours.    [provider]  fosfomycin  (MONUROL ) 3 g PACK First dose today and then another dose in 3 days Patient not taking: Reported on 12/15/2020 12/01/20   Fleeta Rothman, Jomarie SAILOR, MD  furosemide  (LASIX ) 40 MG tablet Take 1 tablet (40 mg total) by mouth daily. 11/18/20   Leotis Bogus, MD  loratadine  (CLARITIN ) 10 MG tablet Take 10 mg by mouth daily.    [provider]  potassium chloride  SA (KLOR-CON ) 20 MEQ tablet  Take 20 mEq by mouth 2 (two) times daily. 02/11/21   [provider]  rosuvastatin  (CRESTOR ) 20 MG tablet Take 20 mg by mouth daily.    [provider]  sodium chloride  1 g tablet Take 1 tablet (1 g total) by mouth 2 (two) times daily with a meal. 11/17/20   Leotis Bogus, MD  tiotropium (SPIRIVA ) 18 MCG inhalation capsule Place 18 mcg into inhaler and inhale daily.    [provider]  traZODone (DESYREL) 50 MG tablet TAKE 1 TABLET(50 MG) BY MOUTH EVERY NIGHT 03/02/21   [provider]  zolpidem (AMBIEN) 10 MG tablet Take 5 mg by mouth at bedtime as needed for sleep. Patient not taking: Reported on 03/02/2021    [provider]  Physical Exam: Vitals:   08/07/24 1200 08/07/24 1300 08/07/24 1400 08/07/24 1415  BP: (!) 127/43 132/72    Pulse: 74 84 80 78  Resp: (!) 22 (!) 28 (!) 28 15  Temp:  97.7 F (36.5 C)    TempSrc:      SpO2: 96% 97% 98% 93%  Height:       Physical Exam Vitals reviewed. Nursing note reviewed: Nursing portion of exam including cardiac, pulmonary abdominal performed by Darryle Lunger, RN. Constitutional:      Appearance: She is normal weight.  HENT:     Head: Normocephalic and atraumatic.     Nose: Nose normal.     Mouth/Throat:     Mouth: Mucous membranes are moist.  Eyes:     Pupils: Pupils are equal, round, and reactive to light.  Cardiovascular:     Rate and Rhythm: Normal rate and regular rhythm.  Pulmonary:     Effort: Pulmonary effort is normal.  Musculoskeletal:     Right lower leg: Edema present.     Left lower leg: Edema present.  Neurological:     General: No focal deficit present.  Psychiatric:        Mood and Affect: Mood normal.     Data Reviewed:  There are no new results to review at this time.  Assessment and Plan: Acute on chronic heart failure with preserved ejection fraction (HFpEF) (HCC) 2D ECHO 10/2023 w/ EF 50-55%  Noted orthopnea, PND, LE swelling x roughly 1 month- suspect likely  diet related in setting of Thanksgiving holiday around onset of symptoms ProBNP 6000 CXR w/ small R pleural effusion  S/p IV lasix  in the ER  Will continue w/ gentle diuresis  Strict Is and Os and daily weights  2D ECHO  Consult cardiology as appropriate   Hypokalemia K 2.8 on presentation w/ noted active diuresis w/ lasix   S/p oral and IV lasix   Replete  Check Mg level  Follow   Persistent atrial fibrillation (HCC) Rate controlled at present Continue home Eliquis  and diltiazem   COPD (chronic obstructive pulmonary disease) (HCC) Appears fairly stable from a respiratory standpoint  No wheezing or increased WOB  Will add on prn xopenex  in setting of atrial fibrillation  Monitor    S/P CABG (coronary artery bypass graft) Baseline CAD s/p CABG  No active chest pain at present  Cont home regimen including baby asa and statin  S/P AVR (aortic valve replacement) History of bioprosthetic aortic valve prosthetic valve stenosis, now s/p TAVR  had a 21mm Magna valve implanted in 2007, which degenerated and became stenotic.   On 05/01/2020, she underwent TAVR  Followed by [facility]  On Eliquis    Hyperlipidemia Cont home crestor       Advance Care Planning:   Code Status: Prior   Consults: Consult cardiology as appropriate   Family Communication: Family at the bedside   Severity of Illness: The appropriate patient status for this patient is OBSERVATION. Observation status is judged to be reasonable and necessary in order to provide the required intensity of service to ensure the patient's safety. The patient's presenting symptoms, physical exam findings, and initial radiographic and laboratory data in the context of their medical condition is felt to place them at decreased risk for further clinical deterioration. Furthermore, it is anticipated that the patient will be medically stable for discharge from the hospital within 2 midnights of admission.    Author: Elspeth JINNY Masters, MD 08/07/2024 5:10 PM  For on  call review [email or web address].     [1]  Allergies Allergen Reactions   Fig Extract [Ficus] Anaphylaxis   Citalopram Hydrobromide Other (See Comments)    Pt unsure if this is accurate   Sulfa Antibiotics Other (See Comments)    Severe nervousness.   Prednisone Other (See Comments)    Other reaction(s): Other (See Comments) So nervous I could climb the walls So nervous I could climb the walls

## 2024-08-07 NOTE — ED Provider Notes (Signed)
 [facility]PT [facility] Emergency Department Provider Note MRN:  [id number]  Arrival date & time: 08/07/2024     Chief Complaint   Leg Swelling   History of Present Illness   Tracy Buck is a 89 y.o. year-old female with a history of COPD presenting to the ED with chief complaint of leg swelling.  Family noticed leg swelling today.  Also noticed patient more short of breath than normal when walking to the dinner table.  Patient has been having trouble laying flat for several weeks, makes her short of breath.  Trouble taking her pills lately, does not like to swallow them.  Denies having any chest pain, no fever or cough.  Also increased degree of sundowning tonight, looks like she is hallucinating per family.  Review of Systems  A thorough review of systems was obtained and all systems are negative except as noted in the HPI and PMH.   Patient's Health History    Past Medical History:  Diagnosis Date   Arthritis    fingers, knees (09/16/2016)   Complication of anesthesia    I often go into severe panic attacks when they try to bring me out and they have to put me back under; sometimes happens when they are starting the anesthesia also (09/16/2016)   COPD (chronic obstructive pulmonary disease) (HCC)    I think this has been recently dx'd (09/16/2016)   Fall 03/02/2021   Forearm laceration 03/02/2021   Forehead abrasion 03/02/2021   Heart valve replaced    Hyperlipidemia    Hypertension    LBBB (left bundle branch block)    Lung cancer (HCC) 2013   Lung mass    Myocardial infarct (HCC)    Panic attack    Pneumonia    when I was very young (09/16/2016)   Rib fracture    Skin cancer of nose    Vaccine counseling 10/28/2021    Past Surgical History:  Procedure Laterality Date   AORTIC VALVE REPLACEMENT     bovine valve   BYPASS GRAFT     does not know if she had CABG (09/16/2016)   CARDIAC SURGERY     does not know if she had CABG (09/16/2016)    CARDIAC VALVE REPLACEMENT     CARPAL TUNNEL RELEASE Right 1959   CHOLECYSTECTOMY N/A 09/17/2016   Procedure: LAPAROSCOPIC CHOLECYSTECTOMY;  Surgeon: Lynda Leos, MD;  Location: [facility] OR;  Service: General;  Laterality: N/A;   DILATION AND CURETTAGE OF UTERUS  1963   KNEE ARTHROSCOPY Left 09/2014   LOBECTOMY Left 2013   took bottom lobe off   SKIN CANCER EXCISION     tip of my nose   TEE WITHOUT CARDIOVERSION N/A 11/11/2020   Procedure: TRANSESOPHAGEAL ECHOCARDIOGRAM (TEE);  Surgeon: Raford Riggs, MD;  Location: [facility] ENDOSCOPY;  Service: Cardiovascular;  Laterality: N/A;   TONSILLECTOMY      Family History  Problem Relation Age of Onset   Transient ischemic attack Mother    Colon cancer Father     Social History   Socioeconomic History   Marital status: Widowed    Spouse name: Not on file   Number of children: Not on file   Years of education: Not on file   Highest education level: Not on file  Occupational History   Not on file  Tobacco Use   Smoking status: Never   Smokeless tobacco: Never  Substance and Sexual Activity   Alcohol use: Yes    Comment: 09/16/2016 2-3 glasses  of wine when at the beach; once/year   Drug use: No   Sexual activity: Not Currently    Birth control/protection: Abstinence  Other Topics Concern   Not on file  Social History Narrative   Not on file   Social Drivers of Health   Tobacco Use: Low Risk (08/06/2024)   Patient History    Smoking Tobacco Use: Never    Smokeless Tobacco Use: Never    Passive Exposure: Not on file  Financial Resource Strain: Not on file  Food Insecurity: Low Risk (01/08/2023)   Received from [facility]   Epic    Within the past 12 months, you worried that your food would run out before you got money to buy more: Never true    Within the past 12 months, the food you bought just didn't last and you didn't have money to get more. : Never true  Transportation Needs: No Transportation Needs (01/08/2023)    Received from [employer]    In the past 12 months, has lack of reliable transportation kept you from medical appointments, meetings, work or from getting things needed for daily living? : No  Physical Activity: Not on file  Stress: Not on file  Social Connections: Not on file  Intimate Partner Violence: Not on file  Depression (EYV7-0): Not on file  Alcohol Screen: Not on file  Housing: Low Risk (01/08/2023)   Received from [facility]   Epic    What is your living situation today?: I have a steady place to live    Think about the place you live. Do you have problems with any of the following? Choose all that apply:: None/None on this list  Utilities: Low Risk (01/08/2023)   Received from [facility]   Utilities    In the past 12 months has the electric, gas, oil, or water  company threatened to shut off services in your home? : No  Health Literacy: Not on file     Physical Exam   Vitals:   08/06/24 2133 08/06/24 2317  BP: (!) 127/48   Pulse: 72 62  Resp: (!) 22   Temp: 98.5 F (36.9 C)   SpO2: 99% 95%    CONSTITUTIONAL: Well-appearing, NAD NEURO/PSYCH:  Alert and oriented x 3, no focal deficits EYES:  eyes equal and reactive ENT/NECK:  no LAD, no JVD CARDIO: Regular rate, well-perfused, normal S1 and S2 PULM:  CTAB no wheezing or rhonchi GI/GU:  non-distended, non-tender MSK/SPINE:  No gross deformities, no edema SKIN:  no rash, atraumatic   *Additional and/or pertinent findings included in MDM below  Diagnostic and Interventional Summary    EKG Interpretation Date/Time:  Monday August 06 2024 21:38:39 EST Ventricular Rate:  71 PR Interval:    QRS Duration:  147 QT Interval:  454 QTC Calculation: 494 R Axis:   99  Text Interpretation: Atrial fibrillation Consider left ventricular hypertrophy Similar to prior No STEMI Confirmed by Darra Chew [phone]) on 08/06/2024 9:46:23 PM       Labs Reviewed  BASIC METABOLIC PANEL WITH GFR -  Abnormal; Notable for the following components:      Result Value   Potassium 2.8 (*)    Chloride 95 (*)    Glucose, Bld 122 (*)    Creatinine, Ser 1.25 (*)    GFR, Estimated 42 (*)    All other components within normal limits  CBC - Abnormal; Notable for the following components:   Hemoglobin 9.6 (*)  HCT 32.0 (*)    MCV 70.2 (*)    MCH 21.1 (*)    RDW 19.1 (*)    All other components within normal limits  PRO BRAIN NATRIURETIC PEPTIDE - Abnormal; Notable for the following components:   Pro Brain Natriuretic Peptide 6,114.0 (*)    All other components within normal limits  URINALYSIS, ROUTINE W REFLEX MICROSCOPIC    DG Chest 2 View  Final Result      Medications  furosemide  (LASIX ) injection 40 mg (40 mg Intravenous Given 08/07/24 0032)  potassium chloride  (KLOR-CON ) packet 40 mEq (40 mEq Oral Given 08/07/24 0032)     Procedures  /  Critical Care Procedures  ED Course and Medical Decision Making  Initial Impression and Ddx Suspect CHF exacerbation.  Had an episode of congestive heart failure back in 2021 in the setting of new onset A-fib.  Has not had issues since.  Takes Lasix  but reportedly has been having trouble with her home medications.  Patient is well-appearing sitting comfortably, conversant and occasionally acting a bit erratically or disinhibited.  Abdomen soft, lungs clear.  Past medical/surgical history that increases complexity of ED encounter: A-fib  Interpretation of Diagnostics I personally reviewed the EKG and my interpretation is as follows: A-fib with left bundle branch block  Labs reveal elevated BNP, hypokalemia, mild creatinine elevation  Patient Reassessment and Ultimate Disposition/Management     Given the laboratory findings and patient's dyspnea exertion, increased confusion requesting hospitalist admission.  Patient management required discussion with the following services or consulting groups:  Hospitalist Service  Complexity of  Problems Addressed Acute illness or injury that poses threat of life of bodily function  Additional Data Reviewed and Analyzed Further history obtained from: Further history from spouse/family member  Additional Factors Impacting ED Encounter Risk Consideration of hospitalization  Ozell HERO. Theadore, MD [facility] [email or web address]  Final Clinical Impressions(s) / ED Diagnoses     ICD-10-CM   1. Acute congestive heart failure, unspecified heart failure type (HCC)  I50.9     2. Confusion  R41.0     3. Hypokalemia  E87.6     4. Dyspnea on exertion  R06.09       ED Discharge Orders     None        Discharge Instructions Discussed with and Provided to Patient:   Discharge Instructions   None      Theadore Ozell HERO, MD 08/07/24 [phone]

## 2024-08-07 NOTE — Assessment & Plan Note (Signed)
 Appears fairly stable from a respiratory standpoint  No wheezing or increased WOB  Will add on prn xopenex  in setting of atrial fibrillation  Monitor

## 2024-08-07 NOTE — Assessment & Plan Note (Signed)
 Baseline CAD s/p CABG  No active chest pain at present  Cont home regimen including baby asa and statin

## 2024-08-07 NOTE — Assessment & Plan Note (Signed)
 Rate controlled at present Continue home Eliquis  and diltiazem 

## 2024-08-08 ENCOUNTER — Observation Stay (HOSPITAL_COMMUNITY)

## 2024-08-08 ENCOUNTER — Encounter (HOSPITAL_COMMUNITY): Payer: Self-pay | Admitting: Internal Medicine

## 2024-08-08 DIAGNOSIS — I5031 Acute diastolic (congestive) heart failure: Secondary | ICD-10-CM

## 2024-08-08 DIAGNOSIS — Z951 Presence of aortocoronary bypass graft: Secondary | ICD-10-CM | POA: Diagnosis not present

## 2024-08-08 DIAGNOSIS — J918 Pleural effusion in other conditions classified elsewhere: Secondary | ICD-10-CM | POA: Diagnosis present

## 2024-08-08 DIAGNOSIS — I4821 Permanent atrial fibrillation: Secondary | ICD-10-CM | POA: Diagnosis present

## 2024-08-08 DIAGNOSIS — Z7189 Other specified counseling: Secondary | ICD-10-CM | POA: Diagnosis not present

## 2024-08-08 DIAGNOSIS — F03A2 Unspecified dementia, mild, with psychotic disturbance: Secondary | ICD-10-CM | POA: Diagnosis present

## 2024-08-08 DIAGNOSIS — I110 Hypertensive heart disease with heart failure: Secondary | ICD-10-CM | POA: Diagnosis present

## 2024-08-08 DIAGNOSIS — I447 Left bundle-branch block, unspecified: Secondary | ICD-10-CM | POA: Diagnosis present

## 2024-08-08 DIAGNOSIS — E785 Hyperlipidemia, unspecified: Secondary | ICD-10-CM | POA: Diagnosis present

## 2024-08-08 DIAGNOSIS — Z85118 Personal history of other malignant neoplasm of bronchus and lung: Secondary | ICD-10-CM | POA: Diagnosis not present

## 2024-08-08 DIAGNOSIS — I2510 Atherosclerotic heart disease of native coronary artery without angina pectoris: Secondary | ICD-10-CM | POA: Diagnosis present

## 2024-08-08 DIAGNOSIS — Z85828 Personal history of other malignant neoplasm of skin: Secondary | ICD-10-CM | POA: Diagnosis not present

## 2024-08-08 DIAGNOSIS — E876 Hypokalemia: Secondary | ICD-10-CM | POA: Diagnosis present

## 2024-08-08 DIAGNOSIS — Z91018 Allergy to other foods: Secondary | ICD-10-CM | POA: Diagnosis not present

## 2024-08-08 DIAGNOSIS — F05 Delirium due to known physiological condition: Secondary | ICD-10-CM | POA: Diagnosis not present

## 2024-08-08 DIAGNOSIS — Z7901 Long term (current) use of anticoagulants: Secondary | ICD-10-CM | POA: Diagnosis not present

## 2024-08-08 DIAGNOSIS — Z953 Presence of xenogenic heart valve: Secondary | ICD-10-CM | POA: Diagnosis not present

## 2024-08-08 DIAGNOSIS — I5033 Acute on chronic diastolic (congestive) heart failure: Secondary | ICD-10-CM

## 2024-08-08 DIAGNOSIS — R627 Adult failure to thrive: Secondary | ICD-10-CM | POA: Diagnosis present

## 2024-08-08 DIAGNOSIS — Z681 Body mass index (BMI) 19 or less, adult: Secondary | ICD-10-CM | POA: Diagnosis not present

## 2024-08-08 DIAGNOSIS — I4819 Other persistent atrial fibrillation: Secondary | ICD-10-CM | POA: Diagnosis not present

## 2024-08-08 DIAGNOSIS — Z515 Encounter for palliative care: Secondary | ICD-10-CM | POA: Diagnosis not present

## 2024-08-08 DIAGNOSIS — Z882 Allergy status to sulfonamides status: Secondary | ICD-10-CM | POA: Diagnosis not present

## 2024-08-08 DIAGNOSIS — I252 Old myocardial infarction: Secondary | ICD-10-CM | POA: Diagnosis not present

## 2024-08-08 DIAGNOSIS — Z66 Do not resuscitate: Secondary | ICD-10-CM | POA: Diagnosis present

## 2024-08-08 DIAGNOSIS — R443 Hallucinations, unspecified: Secondary | ICD-10-CM | POA: Diagnosis not present

## 2024-08-08 DIAGNOSIS — Z888 Allergy status to other drugs, medicaments and biological substances status: Secondary | ICD-10-CM | POA: Diagnosis not present

## 2024-08-08 DIAGNOSIS — J449 Chronic obstructive pulmonary disease, unspecified: Secondary | ICD-10-CM | POA: Diagnosis present

## 2024-08-08 LAB — CBC WITH DIFFERENTIAL/PLATELET
Abs Immature Granulocytes: 0.03 K/uL (ref 0.00–0.07)
Basophils Absolute: 0.1 K/uL (ref 0.0–0.1)
Basophils Relative: 1 %
Eosinophils Absolute: 0.1 K/uL (ref 0.0–0.5)
Eosinophils Relative: 1 %
HCT: 33.4 % — ABNORMAL LOW (ref 36.0–46.0)
Hemoglobin: 9.8 g/dL — ABNORMAL LOW (ref 12.0–15.0)
Immature Granulocytes: 0 %
Lymphocytes Relative: 11 %
Lymphs Abs: 0.9 K/uL (ref 0.7–4.0)
MCH: 20.9 pg — ABNORMAL LOW (ref 26.0–34.0)
MCHC: 29.3 g/dL — ABNORMAL LOW (ref 30.0–36.0)
MCV: 71.4 fL — ABNORMAL LOW (ref 80.0–100.0)
Monocytes Absolute: 0.7 K/uL (ref 0.1–1.0)
Monocytes Relative: 8 %
Neutro Abs: 6.4 K/uL (ref 1.7–7.7)
Neutrophils Relative %: 79 %
Platelets: 265 K/uL (ref 150–400)
RBC: 4.68 MIL/uL (ref 3.87–5.11)
RDW: 19.2 % — ABNORMAL HIGH (ref 11.5–15.5)
WBC: 8.1 K/uL (ref 4.0–10.5)
nRBC: 0 % (ref 0.0–0.2)

## 2024-08-08 LAB — ECHOCARDIOGRAM COMPLETE
AR max vel: 1.47 cm2
AV Area VTI: 1.49 cm2
AV Area mean vel: 1.19 cm2
AV Mean grad: 8 mmHg
AV Peak grad: 15.2 mmHg
Ao pk vel: 1.95 m/s
Area-P 1/2: 4.63 cm2
Calc EF: 41.4 %
Height: 64 in
S' Lateral: 4.5 cm
Single Plane A2C EF: 37.6 %
Single Plane A4C EF: 39.6 %
Weight: 1865.97 [oz_av]

## 2024-08-08 LAB — PRO BRAIN NATRIURETIC PEPTIDE: Pro Brain Natriuretic Peptide: 5865 pg/mL — ABNORMAL HIGH (ref <300.0)

## 2024-08-08 LAB — BASIC METABOLIC PANEL WITH GFR
Anion gap: 8 (ref 5–15)
BUN: 12 mg/dL (ref 8–23)
CO2: 30 mmol/L (ref 22–32)
Calcium: 9 mg/dL (ref 8.9–10.3)
Chloride: 102 mmol/L (ref 98–111)
Creatinine, Ser: 1.23 mg/dL — ABNORMAL HIGH (ref 0.44–1.00)
GFR, Estimated: 42 mL/min — ABNORMAL LOW (ref >60)
Glucose, Bld: 160 mg/dL — ABNORMAL HIGH (ref 70–99)
Potassium: 3.9 mmol/L (ref 3.5–5.1)
Sodium: 139 mmol/L (ref 135–145)

## 2024-08-08 LAB — MAGNESIUM: Magnesium: 2 mg/dL (ref 1.7–2.4)

## 2024-08-08 LAB — PROCALCITONIN: Procalcitonin: 0.2 ng/mL

## 2024-08-08 MED ORDER — FUROSEMIDE 10 MG/ML IJ SOLN
40.0000 mg | Freq: Once | INTRAMUSCULAR | Status: AC
  Administered 2024-08-08: 04:00:00 40 mg via INTRAVENOUS

## 2024-08-08 MED ORDER — METOPROLOL TARTRATE 5 MG/5ML IV SOLN
5.0000 mg | INTRAVENOUS | Status: DC | PRN
  Administered 2024-08-08: 05:00:00 5 mg via INTRAVENOUS
  Filled 2024-08-08: qty 5

## 2024-08-08 MED ORDER — HALOPERIDOL LACTATE 5 MG/ML IJ SOLN
5.0000 mg | Freq: Once | INTRAMUSCULAR | Status: AC | PRN
  Administered 2024-08-08: 01:00:00 5 mg via INTRAVENOUS
  Filled 2024-08-08: qty 1

## 2024-08-08 MED ORDER — FUROSEMIDE 10 MG/ML IJ SOLN
40.0000 mg | Freq: Two times a day (BID) | INTRAMUSCULAR | Status: DC
  Administered 2024-08-08 (×2): 40 mg via INTRAVENOUS
  Filled 2024-08-08 (×2): qty 4

## 2024-08-08 NOTE — Progress Notes (Addendum)
 [facility] night cross cover note:  I was notified by RN that this patient is confused, agitated, restless, yelling, refractory to attempts at verbal redirection. I subsequently placed order for  haldol  5 mg iv x 1 dose prn for agitation.   Update: prior to receiving haldol , patient reports some sob. STAT EKG  ordered. O2 sat 98% on 2 L [place], consistent with earlier during this night shift. May also consider updated cxr once patient is less agitated and able to stay still for this imaging.    Update: additional sob and this patient hospitalized with acute on chronic diastolic heart failure, with history of permanent atrial fibrillation on Eliquis .  Increased respiratory rate, and heart rates into the 130s in the setting of her permanent atrial fibrillation.  Blood pressure tolerating, with systolic pressures in the 160s.  Still maintaining oxygen saturations in the high 90s without interval increase in supplemental oxygen via nasal cannula.  Magnesium  level this morning 2.0.  Not associate with any reported chest pain.  I have ordered a stat chest x-ray, and additional dose of Lasix  40 mg IV x 1 dose now.  Have also added on BNP and procalcitonin levels, and have added as needed IV Lopressor  for sustained heart rates greater than 130 bpm.     Eva Pore, DO Hospitalist

## 2024-08-08 NOTE — Evaluation (Signed)
 Occupational Therapy Evaluation Patient Details Name: Tracy Buck MRN: [id number] DOB: [birth year] Today's Date: 08/08/2024   History of Present Illness   Pt is an 89 y/o female admitted for acute on chronic HFpEF. PMH: chronic HFpEF, HTN, HLD, COPD, CAD s/p CABG, TAVR     Clinical Impressions PTA, pt lives with daughter and son in law, reports typically ambulatory and able to manage ADLs without assist (aside from supervision for safety during showering tasks). Pt presents now with deficits in cognition, standing balance and endurance. However, deficits potentially exacerbated as pt reports feeling sleepy likely from medication overnight. Pt requires Setup for UB ADL, Min A for LB ADLs and CGA-Min A for transfers. Anticipate good improvements with continued OOB activity while admitted to return home with HHOT upon DC. Will continue to follow.     If plan is discharge home, recommend the following:   A little help with bathing/dressing/bathroom;Assistance with cooking/housework;Direct supervision/assist for medications management;Direct supervision/assist for financial management;Assist for transportation     Functional Status Assessment   Patient has had a recent decline in their functional status and demonstrates the ability to make significant improvements in function in a reasonable and predictable amount of time.     Equipment Recommendations   Other (comment) (TBD)     Recommendations for Other Services         Precautions/Restrictions   Precautions Precautions: Fall Restrictions Weight Bearing Restrictions Per Provider Order: No     Mobility Bed Mobility Overal bed mobility: Needs Assistance Bed Mobility: Supine to Sit, Sit to Supine     Supine to sit: Supervision Sit to supine: Supervision        Transfers Overall transfer level: Needs assistance Equipment used: 1 person hand held assist Transfers: Sit to/from Stand Sit to Stand: Min assist            General transfer comment: Min A to stand with handheld assist at bedside, able to take steps along bedside with CGA.      Balance Overall balance assessment: Needs assistance Sitting-balance support: Feet supported, No upper extremity supported Sitting balance-Leahy Scale: Fair     Standing balance support: No upper extremity supported, During functional activity, Single extremity supported Standing balance-Leahy Scale: Fair                             ADL either performed or assessed with clinical judgement   ADL Overall ADL's : Needs assistance/impaired Eating/Feeding: Independent   Grooming: Set up;Sitting;Wash/dry face   Upper Body Bathing: Set up;Sitting   Lower Body Bathing: Sit to/from stand;Sitting/lateral leans;Minimal assistance   Upper Body Dressing : Set up;Sitting   Lower Body Dressing: Minimal assistance;Sit to/from stand;Sitting/lateral leans   Toilet Transfer: Contact guard assist;Stand-pivot;BSC/3in1   Toileting- Clothing Manipulation and Hygiene: Minimal assistance;Sitting/lateral lean;Sit to/from stand               Vision Baseline Vision/History: 1 Wears glasses Ability to See in Adequate Light: 0 Adequate Patient Visual Report: No change from baseline Vision Assessment?: No apparent visual deficits     Perception         Praxis         Pertinent Vitals/Pain Pain Assessment Pain Assessment: No/denies pain     Extremity/Trunk Assessment Upper Extremity Assessment Upper Extremity Assessment: Overall WFL for tasks assessed;Right hand dominant   Lower Extremity Assessment Lower Extremity Assessment: Defer to PT evaluation   Cervical / Trunk Assessment Cervical / Trunk  Assessment: Kyphotic   Communication Communication Communication: No apparent difficulties   Cognition Arousal: Alert Behavior During Therapy: WFL for tasks assessed/performed Cognition: Cognition impaired   Orientation impairments:  Situation Awareness: Online awareness impaired Memory impairment (select all impairments): Short-term memory Attention impairment (select first level of impairment): Selective attention   OT - Cognition Comments: pleasant, endorses feeling sleepy (was given haldol  overnight). able to follow directions, some minor confusion when providing recall of recent events leading to admission but overall WFL                 Following commands: Intact       Cueing  General Comments   Cueing Techniques: Verbal cues      Exercises     Shoulder Instructions      Home Living Family/patient expects to be discharged to:: Private residence Living Arrangements: Children;Other (Comment) (and son in law) Available Help at Discharge: Family Type of Home: House Home Access: Stairs to enter [employer] of Steps: 3-4   Home Layout: Two level;Able to live on main level with bedroom/bathroom               Home Equipment: Shower seat          Prior Functioning/Environment Prior Level of Function : Independent/Modified Independent;Patient poor historian/Family not available             Mobility Comments: no AD for mobility ADLs Comments: Reports Indep with ADLs but only showers once a week when family there for supervision. Sponge bathes on other days. Reports family manages cooking and cleaning. son in law assists with med mgmt    OT Problem List: Decreased strength;Decreased activity tolerance;Impaired balance (sitting and/or standing);Decreased cognition;Cardiopulmonary status limiting activity   OT Treatment/Interventions: Self-care/ADL training;Therapeutic exercise;Energy conservation;DME and/or AE instruction;Therapeutic activities;Patient/family education;Balance training      OT Goals(Current goals can be found in the care plan section)   Acute Rehab OT Goals Patient Stated Goal: not feel so sleepy OT Goal Formulation: With patient Time For Goal  Achievement: 08/22/24 Potential to Achieve Goals: Good   OT Frequency:  Min 2X/week    Co-evaluation              AM-PAC OT 6 Clicks Daily Activity     Outcome Measure Help from another person eating meals?: None Help from another person taking care of personal grooming?: A Little Help from another person toileting, which includes using toliet, bedpan, or urinal?: A Little Help from another person bathing (including washing, rinsing, drying)?: A Little Help from another person to put on and taking off regular upper body clothing?: A Little Help from another person to put on and taking off regular lower body clothing?: A Little 6 Click Score: 19   End of Session Equipment Utilized During Treatment: Oxygen  Activity Tolerance: Patient tolerated treatment well Patient left: in bed;with call bell/phone within reach;with bed alarm set  OT Visit Diagnosis: Unsteadiness on feet (R26.81)                Time: 9265-9250 OT Time Calculation (min): 15 min Charges:  OT General Charges $OT Visit: 1 Visit OT Evaluation $OT Eval Low Complexity: 1 Low  Mliss NOVAK, OTR/L Acute Rehab Services Office: [phone]   Mliss Fish 08/08/2024, 8:34 AM

## 2024-08-08 NOTE — Care Management Obs Status (Signed)
 MEDICARE OBSERVATION STATUS NOTIFICATION   Patient Details  Name: Tracy Buck MRN: [id number] Date of Birth: 1935-05-24   Medicare Observation Status Notification Given:  Yes    Vonzell Arrie Sharps 08/08/2024, 12:03 PM

## 2024-08-08 NOTE — Plan of Care (Signed)
°  Problem: Health Behavior/Discharge Planning: Goal: Ability to manage health-related needs will improve Outcome: Progressing   Problem: Clinical Measurements: Goal: Ability to maintain clinical measurements within normal limits will improve Outcome: Progressing Goal: Will remain free from infection Outcome: Progressing   Problem: Nutrition: Goal: Adequate nutrition will be maintained Outcome: Progressing   Problem: Elimination: Goal: Will not experience complications related to urinary retention Outcome: Progressing   Problem: Pain Managment: Goal: General experience of comfort will improve and/or be controlled Outcome: Progressing   Problem: Safety: Goal: Ability to remain free from injury will improve Outcome: Progressing   Problem: Education: Goal: Ability to demonstrate management of disease process will improve Outcome: Progressing Goal: Ability to verbalize understanding of medication therapies will improve Outcome: Progressing Goal: Individualized Educational Video(s) Outcome: Progressing   Problem: Cardiac: Goal: Ability to achieve and maintain adequate cardiopulmonary perfusion will improve Outcome: Progressing   Problem: Activity: Goal: Risk for activity intolerance will decrease Outcome: Not Progressing   Problem: Elimination: Goal: Will not experience complications related to bowel motility Outcome: Not Progressing   Problem: Activity: Goal: Capacity to carry out activities will improve Outcome: Not Progressing

## 2024-08-08 NOTE — TOC CM/SW Note (Signed)
 Transition of Care [facility]) - Inpatient Brief Assessment   Patient Details  Name: Shanigua Gibb MRN: [id number] Date of Birth: [birth year]  Transition of Care [facility]) CM/SW Contact:    Waddell Barnie Rama, RN Phone Number: 08/08/2024, 12:19 PM   Clinical Narrative: From home with daughter, has PCP, Celinda PA and insurance on file, states has no HH services in place at this time, has walker, cane and w/chair that she uses for long distances at home.  States family member (daughter)  will transport them home at [employer] and family is support system, .  Pta self ambulatory.   Per PT eval rec HHPT, NCM offered choice for HHPT, daughter states they may need hospice she is not sure, she states if she needs HHPT she would [facility].  Referral sent thru portal to [facility].  They have accepted , soc will begin 24 to 48 hrs post dc.    Transition of Care Asessment: Insurance and Status: Insurance coverage has been reviewed Patient has primary care physician: Yes Home environment has been reviewed: home with daughter Prior level of function:: indep Prior/Current Home Services: Current home services (walker, cane, w/chair, only use w/chair for long distance) Social Drivers of Health Review: SDOH reviewed no interventions necessary Readmission risk has been reviewed: Yes Transition of care needs: transition of care needs identified, TOC will continue to follow

## 2024-08-08 NOTE — Progress Notes (Signed)
 PROGRESS NOTE    Tracy Buck  FMW:[id number] DOB: July 04, 1935 DOA: 08/07/2024 PCP: Ilda Cadet, PA-C  89/F with history of diastolic CHF, COPD, CABG, TAVR was brought to the ED by family with increased work of breathing, lower extremity edema for few weeks.  Family reports poor compliance with diet, excessive salt intake around Thanksgiving, worsening orthopnea, in the ED she was tachypneic, mildly tachycardic, hypoxic labs noted WBC of 7.1, hemoglobin 9, creatinine 1.2, proBNP 6114, chest x-ray with pleural effusions  Subjective: -Still short of breath, very poor historian  Assessment and Plan:  Acute on chronic heart failure with preserved ejection fraction (HFpEF) (HCC) Pleural effusions -Last echo 3/22 noted EF 60-65%, normal RV, mildly elevated PASP -Today's x-ray with moderate left effusion and small right effusion, increased bilateral interstitial and patchy opacities -Clinical suspicion for pneumonia is low, hold off on antibiotics - Continue IV Lasix  today - Hold apixaban , repeat x-ray tomorrow, potential need for thoracentesis - Add low-dose Aldactone , caution with excess GDMT  Hypokalemia Repleted, mag is acceptable  Persistent atrial fibrillation (HCC) Rate controlled at present Continue diltiazem , hold Eliquis  for possible thoracentesis  COPD (chronic obstructive pulmonary disease) (HCC) Continue as needed Xopenex   S/P CABG (coronary artery bypass graft) -Stable, has no symptoms at this time, continue asa and statin  S/P AVR (aortic valve replacement) History of bioprosthetic aortic valve prosthetic valve stenosis, now s/p TAVR  had a 21mm Magna valve implanted in 2007, which degenerated and became stenotic.   On 05/01/2020, she underwent TAVR  Followed by [facility]  On Eliquis    Hyperlipidemia Cont home crestor   DVT prophylaxis: Eliquis , holding yesterday Code Status: DNR Family Communication: No family at bedside, called and updated  daughter Disposition Plan: Home pending improvement in symptoms  Consultants:    Procedures:   Antimicrobials:    Objective: Vitals:   08/08/24 0800 08/08/24 0815 08/08/24 1013 08/08/24 1018  BP:  (!) 109/50 (!) 120/58 (!) 120/58  Pulse:  81  78  Resp: (!) 21 (!) 22  20  Temp:  (!) 97.4 F (36.3 C)  98.4 F (36.9 C)  TempSrc:  Axillary  Oral  SpO2: 100% 100%    Weight:      Height:        Intake/Output Summary (Last 24 hours) at 08/08/2024 1050 Last data filed at 08/08/2024 0900 Gross per 24 hour  Intake 60 ml  Output 200 ml  Net -140 ml   Filed Weights   08/07/24 1831 08/08/24 0045 08/08/24 0530  Weight: 60.1 kg 52.9 kg 52.9 kg    Examination:  General exam: Elderly chronically ill, AAO x 2, moderate cognitive deficits HEENT: Positive JVD Respiratory system: Creased breath sounds in the left Cardiovascular system: S1 & S2 heard, RRR.  Abd: nondistended, soft and nontender.Normal bowel sounds heard. Central nervous system: Alert and oriented. No focal neurological deficits. Extremities: Trace edema Skin: No rashes Psychiatry:  Mood & affect appropriate.     Data Reviewed:   CBC: Recent Labs  Lab 08/06/24 2136 08/08/24 0245  WBC 7.1 8.1  NEUTROABS  --  6.4  HGB 9.6* 9.8*  HCT 32.0* 33.4*  MCV 70.2* 71.4*  PLT 278 265   Basic Metabolic Panel: Recent Labs  Lab 08/06/24 2136 08/07/24 1613 08/08/24 0245  NA 136 138 139  K 2.8* 2.8* 3.9  CL 95* 99 102  CO2 28 29 30   GLUCOSE 122* 135* 160*  BUN 13 11 12   CREATININE 1.25* 1.21* 1.23*  CALCIUM   9.1 9.1 9.0  MG  --  1.9 2.0   GFR: Estimated Creatinine Clearance: 26.9 mL/min (A) (by C-G formula based on SCr of 1.23 mg/dL (H)). Liver Function Tests: Recent Labs  Lab 08/07/24 1613  AST 26  ALT 10  ALKPHOS 78  BILITOT 1.0  PROT 6.1*  ALBUMIN 3.7   No results for input(s): LIPASE, AMYLASE in the last 168 hours. No results for input(s): AMMONIA in the last 168 hours. Coagulation  Profile: No results for input(s): INR, PROTIME in the last 168 hours. Cardiac Enzymes: No results for input(s): CKTOTAL, CKMB, CKMBINDEX, TROPONINI in the last 168 hours. BNP (last 3 results) Recent Labs    08/06/24 2136 08/08/24 0245  PROBNP 6,114.0* 5,865.0*   HbA1C: No results for input(s): HGBA1C in the last 72 hours. CBG: No results for input(s): GLUCAP in the last 168 hours. Lipid Profile: No results for input(s): CHOL, HDL, LDLCALC, TRIG, CHOLHDL, LDLDIRECT in the last 72 hours. Thyroid Function Tests: No results for input(s): TSH, T4TOTAL, FREET4, T3FREE, THYROIDAB in the last 72 hours. Anemia Panel: No results for input(s): VITAMINB12, FOLATE, FERRITIN, TIBC, IRON, RETICCTPCT in the last 72 hours. Urine analysis:    Component Value Date/Time   COLORURINE YELLOW 08/07/2024 0128   APPEARANCEUR CLEAR 08/07/2024 0128   LABSPEC 1.015 08/07/2024 0128   PHURINE 6.5 08/07/2024 0128   GLUCOSEU NEGATIVE 08/07/2024 0128   HGBUR MODERATE (A) 08/07/2024 0128   BILIRUBINUR NEGATIVE 08/07/2024 0128   KETONESUR NEGATIVE 08/07/2024 0128   PROTEINUR NEGATIVE 08/07/2024 0128   NITRITE NEGATIVE 08/07/2024 0128   LEUKOCYTESUR TRACE (A) 08/07/2024 0128   Sepsis Labs: @LABRCNTIP (procalcitonin:4,lacticidven:4)  )No results found for this or any previous visit (from the past 240 hours).   Radiology Studies: DG Chest Port 1 View Result Date: 08/08/2024 EXAM: 1 VIEW(S) XRAY OF THE CHEST 08/08/2024 05:40:22 AM COMPARISON: 08/06/2024 CLINICAL HISTORY: SOB (shortness of breath) FINDINGS: LUNGS AND PLEURA: Moderate left pleural effusion. Small right pleural effusion. Increased bilateral interstitial and patchy opacities. Left basilar consolidation. No pneumothorax. HEART AND MEDIASTINUM: Cardiomegaly. Aortic valve replacement and aortic stent noted. Atherosclerotic calcifications. BONES AND SOFT TISSUES: Median sternotomy noted. Partially imaged  cervicothoracic fixation hardware. No acute osseous abnormality. IMPRESSION: 1. Moderate left pleural effusion and small right pleural effusion. 2. Increased bilateral interstitial and patchy opacities with left basilar consolidation. 3. Cardiomegaly with aortic valve replacement and aortic stent. Atherosclerotic calcifications. Electronically signed by: Evalene Coho MD 08/08/2024 05:54 AM EST RP Workstation: [id number]   DG Chest 2 View Result Date: 08/06/2024 EXAM: 2 VIEW(S) XRAY OF THE CHEST 08/06/2024 10:01:00 PM COMPARISON: 12/15/2020 CLINICAL HISTORY: FINDINGS: LUNGS AND PLEURA: Findings of the costophrenic angles are noted bilaterally. A small pleural effusion is likely present on the right. Scarring on the left is present as it is stable from 2022. No pneumothorax. HEART AND MEDIASTINUM: Cardiac valve replacement. Cardiomegaly. BONES AND SOFT TISSUES: Multilevel degenerative changes of thoracic spine. Remote right rib fractures. Posterior fixation of upper thoracic spine. Median sternotomy noted. No acute osseous abnormality. IMPRESSION: 1. Small right pleural effusion. 2. Stable left lung scarring. Electronically signed by: Oneil Devonshire MD 08/06/2024 10:16 PM EST RP Workstation: [id number]     Scheduled Meds:  diltiazem   120 mg Oral Daily   furosemide   40 mg Intravenous BID   rosuvastatin   20 mg Oral Daily   Continuous Infusions:   LOS: 0 days    Time spent:    Sigurd Pac, MD [facility]   08/08/2024, 10:50 AM

## 2024-08-08 NOTE — Evaluation (Signed)
 Clinical/Bedside Swallow Evaluation Patient Details  Name: Tracy Buck MRN: [id number] Date of Birth: July 13, 1935  Today's Date: 08/08/2024 Time: SLP Start Time (ACUTE ONLY): 0919 SLP Stop Time (ACUTE ONLY): 0936 SLP Time Calculation (min) (ACUTE ONLY): 17 min  Past Medical History:  Past Medical History:  Diagnosis Date   Arthritis    fingers, knees (09/16/2016)   Complication of anesthesia    I often go into severe panic attacks when they try to bring me out and they have to put me back under; sometimes happens when they are starting the anesthesia also (09/16/2016)   COPD (chronic obstructive pulmonary disease) (HCC)    I think this has been recently dx'd (09/16/2016)   Fall 03/02/2021   Forearm laceration 03/02/2021   Forehead abrasion 03/02/2021   Heart valve replaced    Hyperlipidemia    Hypertension    LBBB (left bundle branch block)    Lung cancer (HCC) 2013   Lung mass    Myocardial infarct (HCC)    Panic attack    Pneumonia    when I was very young (09/16/2016)   Rib fracture    Skin cancer of nose    Vaccine counseling 10/28/2021   Past Surgical History:  Past Surgical History:  Procedure Laterality Date   AORTIC VALVE REPLACEMENT     bovine valve   BYPASS GRAFT     does not know if she had CABG (09/16/2016)   CARDIAC SURGERY     does not know if she had CABG (09/16/2016)   CARDIAC VALVE REPLACEMENT     CARPAL TUNNEL RELEASE Right 1959   CHOLECYSTECTOMY N/A 09/17/2016   Procedure: LAPAROSCOPIC CHOLECYSTECTOMY;  Surgeon: Lynda Leos, MD;  Location: [facility] OR;  Service: General;  Laterality: N/A;   DILATION AND CURETTAGE OF UTERUS  1963   KNEE ARTHROSCOPY Left 09/2014   LOBECTOMY Left 2013   took bottom lobe off   SKIN CANCER EXCISION     tip of my nose   TEE WITHOUT CARDIOVERSION N/A 11/11/2020   Procedure: TRANSESOPHAGEAL ECHOCARDIOGRAM (TEE);  Surgeon: Raford Riggs, MD;  Location: [facility] ENDOSCOPY;  Service: Cardiovascular;  Laterality: N/A;    TONSILLECTOMY     HPI:  Pt is an 89 y/o female admitted for acute on chronic HFpEF. CXR Increased bilateral interstitial and patchy opacities with left basilar consolidation. FEES 12/2023 at [facility] laryngeal penetration solid/thin clears with reflexive throat clears, no aspiration, mild-mod residue reduced with liquid wash, rec's easy to chew textures/thin. PMH: chronic HFpEF, HTN, HLD, COPD, CAD s/p CABG, TAVR, lung mass    Assessment / Plan / Recommendation  Clinical Impression  Pt exhibits functional oropharyngeal swallow with recommendation to continue regular, thin, pills whole in applesauce (cut large in half), swallow twice and liquids after solids when needed. No further ST warranted. Pt has history of pharyngeal residue from prior FEES decreased with sips liquid and confirmed pharyngeal globus sensation during today's assessment. Pt able to perform second swallow followed by sip liquid when needed with pt report that it cleared. There were no s/s aspiration with multiple sips via straw and mastication was timely without residue. Pt has COPD placing her at higher risk for silent aspiration and CXR showed opacities with left basilar consolidation - no diagnosis of pna per MD at present. Do not feel an instrumental test is warranted at this time however if admitted with pna in future may need to consider. Pt able to recall strategies and sign placed on wall. No further ST warranted.  SLP Visit Diagnosis: Dysphagia, unspecified (R13.10)    Aspiration Risk  Mild aspiration risk    Diet Recommendation           Other Recommendations Oral Care Recommendations: Oral care BID     Swallow Evaluation Recommendations Recommendations: PO diet PO Diet Recommendation: Regular;Thin liquids (Level 0) Liquid Administration via: Cup;Straw Medication Administration: Other (Comment) (cut large in half) Supervision: Patient able to self-feed Swallowing strategies  : Multiple dry swallows after each  bite/sip;Follow solids with liquids Postural changes: Position pt fully upright for meals Oral care recommendations: Oral care BID (2x/day)   Assistance Recommended at Discharge    Functional Status Assessment Patient has not had a recent decline in their functional status  Frequency and Duration            Prognosis        Swallow Study   General Date of Onset: 08/08/24 HPI: Pt is an 89 y/o female admitted for acute on chronic HFpEF. CXR Increased bilateral interstitial and patchy opacities with left basilar consolidation. FEES 12/2023 at [facility] laryngeal penetration solid/thin clears with reflexive throat clears, no aspiration, mild-mod residue reduced with liquid wash, rec's easy to chew textures/thin. PMH: chronic HFpEF, HTN, HLD, COPD, CAD s/p CABG, TAVR, lung mass Type of Study: Bedside Swallow Evaluation Previous Swallow Assessment:  (see HPI) Diet Prior to this Study: Regular;Thin liquids (Level 0) Temperature Spikes Noted: No Respiratory Status: Nasal cannula History of Recent Intubation: No Behavior/Cognition: Alert;Cooperative;Pleasant mood Oral Cavity Assessment: Within Functional Limits Oral Care Completed by SLP: No Oral Cavity - Dentition: Other (Comment) (natural upper, lower partial) Vision: Functional for self-feeding Self-Feeding Abilities: Able to feed self Patient Positioning: Upright in bed Baseline Vocal Quality: Normal Volitional Cough: Weak Volitional Swallow: Able to elicit    Oral/Motor/Sensory Function Overall Oral Motor/Sensory Function: Within functional limits   Ice Chips Ice chips: Not tested   Thin Liquid Thin Liquid: Within functional limits Presentation: Straw    Nectar Thick Nectar Thick Liquid: Not tested   Honey Thick Honey Thick Liquid: Not tested   Puree Puree: Within functional limits   Solid     Solid: Within functional limits      Dustin Olam Bull 08/08/2024,9:55 AM

## 2024-08-08 NOTE — Evaluation (Signed)
 Physical Therapy Evaluation Patient Details Name: Tracy Buck MRN: [id number] DOB: [birth year] Today's Date: 08/08/2024  History of Present Illness  Pt is an 89 y/o female admitted for acute on chronic HFpEF. PMH: chronic HFpEF, HTN, HLD, COPD, CAD s/p CABG, TAVR  Clinical Impression  Prior to admittance, pt reports indep with mobility and needs assistance with bathing. Pt presents to evaluation with deficits in mobility, strength, power, activity tolerance, and balance. Pt was able to ambulate at EOB with HHA for improved balance. Pt was encouraged to use RW with staff and upon discharge for optimal balance and stability. PT will continue to treat pt while she is admitted. Recommending HHPT at discharge to address remaining mobility deficits and optimize return to PLOF.         If plan is discharge home, recommend the following: A little help with walking and/or transfers;Assist for transportation;Help with stairs or ramp for entrance   Can travel by private vehicle        Equipment Recommendations None recommended by PT  Recommendations for Other Services       Functional Status Assessment Patient has had a recent decline in their functional status and demonstrates the ability to make significant improvements in function in a reasonable and predictable amount of time.     Precautions / Restrictions Precautions Precautions: Fall Recall of Precautions/Restrictions: Intact Restrictions Weight Bearing Restrictions Per Provider Order: No      Mobility  Bed Mobility Overal bed mobility: Needs Assistance Bed Mobility: Supine to Sit, Sit to Supine     Supine to sit: Supervision Sit to supine: Supervision   General bed mobility comments: increased time to complete    Transfers Overall transfer level: Needs assistance Equipment used: 1 person hand held assist Transfers: Sit to/from Stand Sit to Stand: Contact guard assist           General transfer comment: no physical  assistance given, pt requiring several trials, increasing anterior trunk lean with greater momentum to assist with stand    Ambulation/Gait Ambulation/Gait assistance: Contact guard assist Gait Distance (Feet): 24 Feet (side steps EOB) Assistive device: 1 person hand held assist Gait Pattern/deviations: Step-to pattern, Decreased stride length, Trunk flexed, Shuffle Gait velocity: reduced Gait velocity interpretation: <1.8 ft/sec, indicate of risk for recurrent falls   General Gait Details: Pt with reduced hip and knee flexion, dragging bilateral LEs when performing side steps at EOB.  Stairs            Wheelchair Mobility     Tilt Bed    Modified Rankin (Stroke Patients Only)       Balance Overall balance assessment: Needs assistance Sitting-balance support: Feet supported, No upper extremity supported Sitting balance-Leahy Scale: Fair Sitting balance - Comments: seated EOB   Standing balance support: No upper extremity supported, During functional activity Standing balance-Leahy Scale: Fair Standing balance comment: able to stand without UE support but reahces for HHA for gait                             Pertinent Vitals/Pain      Home Living Family/patient expects to be discharged to:: Private residence Living Arrangements: Children;Other (Comment) (son in law) Available Help at Discharge: Family Type of Home: House Home Access: Stairs to enter   [employer] of Steps: 3-4   Home Layout: Two level;Able to live on main level with bedroom/bathroom Home Equipment: Shower seat;Cane - single point;Rolling Walker (2 wheels)  Prior Function Prior Level of Function : Independent/Modified Independent;Patient poor historian/Family not available             Mobility Comments: no AD for mobility ADLs Comments: Reports Indep with ADLs but only showers once a week when family there for supervision. Sponge bathes on other days. Reports  family manages cooking and cleaning. son in law assists with med mgmt     Extremity/Trunk Assessment   Upper Extremity Assessment Upper Extremity Assessment: Defer to OT evaluation    Lower Extremity Assessment Lower Extremity Assessment: Generalized weakness    Cervical / Trunk Assessment Cervical / Trunk Assessment: Kyphotic  Communication   Communication Communication: No apparent difficulties    Cognition Arousal: Alert Behavior During Therapy: WFL for tasks assessed/performed   PT - Cognitive impairments: No family/caregiver present to determine baseline, Sequencing, Problem solving                         Following commands: Intact       Cueing Cueing Techniques: Verbal cues     General Comments General comments (skin integrity, edema, etc.): VSS    Exercises     Assessment/Plan    PT Assessment Patient needs continued PT services  PT Problem List Decreased strength;Decreased range of motion;Decreased activity tolerance;Decreased balance;Decreased mobility;Decreased coordination;Decreased cognition;Decreased knowledge of use of DME;Decreased safety awareness;Cardiopulmonary status limiting activity       PT Treatment Interventions DME instruction;Gait training;Functional mobility training;Therapeutic activities;Therapeutic exercise;Balance training;Stair training;Patient/family education;Wheelchair mobility training;Manual techniques    PT Goals (Current goals can be found in the Care Plan section)  Acute Rehab PT Goals Patient Stated Goal: to go home PT Goal Formulation: With patient Time For Goal Achievement: 08/22/24 Potential to Achieve Goals: Fair    Frequency Min 2X/week     Co-evaluation               AM-PAC PT 6 Clicks Mobility  Outcome Measure Help needed turning from your back to your side while in a flat bed without using bedrails?: A Little Help needed moving from lying on your back to sitting on the side of a flat bed  without using bedrails?: A Little Help needed moving to and from a bed to a chair (including a wheelchair)?: A Little Help needed standing up from a chair using your arms (e.g., wheelchair or bedside chair)?: A Little Help needed to walk in hospital room?: A Little Help needed climbing 3-5 steps with a railing? : Total 6 Click Score: 16    End of Session Equipment Utilized During Treatment: Gait belt;Oxygen (6L HFNC) Activity Tolerance: Patient tolerated treatment well Patient left: in bed;with call bell/phone within reach;with bed alarm set Nurse Communication: Mobility status PT Visit Diagnosis: Unsteadiness on feet (R26.81);Other abnormalities of gait and mobility (R26.89);Muscle weakness (generalized) (M62.81)    Time: 9173-9144 PT Time Calculation (min) (ACUTE ONLY): 29 min   Charges:   PT Evaluation $PT Eval Moderate Complexity: 1 Mod   PT General Charges $$ ACUTE PT VISIT: 1 Visit         Leontine Hilt DPT Acute Rehab Services [phone] Prefer contact via chat   Leontine NOVAK Artasia Thang 08/08/2024, 10:01 AM

## 2024-08-08 NOTE — Progress Notes (Signed)
 Heart Failure Navigator Progress Note  Assessed for Heart & Vascular TOC clinic readiness.  Patient does not meet criteria due to she is a patient with [facility]. No HF TOC. .   Navigator will sign off at this time.   Stephane Haddock, BSN, Scientist, Clinical (histocompatibility And Immunogenetics) Only

## 2024-08-09 ENCOUNTER — Inpatient Hospital Stay (HOSPITAL_COMMUNITY)

## 2024-08-09 DIAGNOSIS — I5031 Acute diastolic (congestive) heart failure: Secondary | ICD-10-CM | POA: Diagnosis not present

## 2024-08-09 DIAGNOSIS — Z66 Do not resuscitate: Secondary | ICD-10-CM

## 2024-08-09 DIAGNOSIS — E785 Hyperlipidemia, unspecified: Secondary | ICD-10-CM

## 2024-08-09 DIAGNOSIS — I4819 Other persistent atrial fibrillation: Secondary | ICD-10-CM

## 2024-08-09 DIAGNOSIS — Z7189 Other specified counseling: Secondary | ICD-10-CM

## 2024-08-09 DIAGNOSIS — Z515 Encounter for palliative care: Secondary | ICD-10-CM

## 2024-08-09 HISTORY — PX: IR THORACENTESIS ASP PLEURAL SPACE W/IMG GUIDE: IMG5380

## 2024-08-09 LAB — CBC
HCT: 31.7 % — ABNORMAL LOW (ref 36.0–46.0)
Hemoglobin: 9.5 g/dL — ABNORMAL LOW (ref 12.0–15.0)
MCH: 21.3 pg — ABNORMAL LOW (ref 26.0–34.0)
MCHC: 30 g/dL (ref 30.0–36.0)
MCV: 70.9 fL — ABNORMAL LOW (ref 80.0–100.0)
Platelets: 250 K/uL (ref 150–400)
RBC: 4.47 MIL/uL (ref 3.87–5.11)
RDW: 18.6 % — ABNORMAL HIGH (ref 11.5–15.5)
WBC: 8.9 K/uL (ref 4.0–10.5)
nRBC: 0 % (ref 0.0–0.2)

## 2024-08-09 LAB — BASIC METABOLIC PANEL WITH GFR
Anion gap: 11 (ref 5–15)
BUN: 15 mg/dL (ref 8–23)
CO2: 26 mmol/L (ref 22–32)
Calcium: 9.2 mg/dL (ref 8.9–10.3)
Chloride: 99 mmol/L (ref 98–111)
Creatinine, Ser: 1.57 mg/dL — ABNORMAL HIGH (ref 0.44–1.00)
GFR, Estimated: 32 mL/min — ABNORMAL LOW (ref >60)
Glucose, Bld: 133 mg/dL — ABNORMAL HIGH (ref 70–99)
Potassium: 4.6 mmol/L (ref 3.5–5.1)
Sodium: 136 mmol/L (ref 135–145)

## 2024-08-09 LAB — BODY FLUID CELL COUNT WITH DIFFERENTIAL
Eos, Fluid: 0 %
Lymphs, Fluid: 28 %
Monocyte-Macrophage-Serous Fluid: 15 % — ABNORMAL LOW (ref 50–90)
Neutrophil Count, Fluid: 57 % — ABNORMAL HIGH (ref 0–25)
Total Nucleated Cell Count, Fluid: 320 uL (ref 0–1000)

## 2024-08-09 MED ORDER — LIDOCAINE-EPINEPHRINE 1 %-1:100000 IJ SOLN
INTRAMUSCULAR | Status: AC
  Filled 2024-08-09: qty 20

## 2024-08-09 MED ORDER — SPIRONOLACTONE 12.5 MG HALF TABLET
12.5000 mg | ORAL_TABLET | Freq: Every day | ORAL | Status: DC
  Administered 2024-08-09 – 2024-08-12 (×4): 12.5 mg via ORAL
  Filled 2024-08-09 (×4): qty 1

## 2024-08-09 MED ORDER — FUROSEMIDE 40 MG PO TABS
40.0000 mg | ORAL_TABLET | Freq: Every day | ORAL | Status: DC
  Administered 2024-08-09 – 2024-08-13 (×5): 40 mg via ORAL
  Filled 2024-08-09 (×5): qty 1

## 2024-08-09 MED ADMIN — Lidocaine Inj 1% w/ Epinephrine-1:200000 (PF): 10 mL | @ 11:00:00 | NDC 63323048717

## 2024-08-09 NOTE — Progress Notes (Signed)
 PROGRESS NOTE    Tracy Buck  FMW:[id number] DOB: 1934/12/18 DOA: 08/07/2024 PCP: Tracy Cadet, PA-C  89/F with history of diastolic CHF, COPD, CABG, TAVR, dementia was brought to the ED by family with increased work of breathing, lower extremity edema for few weeks.  Family reports poor compliance with diet, excessive salt intake around Thanksgiving, worsening orthopnea, in the ED she was tachypneic, mildly tachycardic, hypoxic labs noted WBC of 7.1, hemoglobin 9, creatinine 1.2, proBNP 6114, chest x-ray with pleural effusions  Subjective: - Is better, breathing is improving  Assessment and Plan:  Acute on chronic heart failure with preserved ejection fraction (HFpEF) (HCC) Moderate left pleural effusion -Last echo 3/22 noted EF 60-65%, normal RV, mildly elevated PASP - Repeat x-ray with moderate left effusion and small right effusion, increased bilateral interstitial and patchy opacities -Clinical suspicion for pneumonia is low, hold off on antibiotics - Creatinine trending up, hold further IV Lasix  today, thoracentesis in IR, apixaban  on hold -Started on low-dose Aldactone , caution with excess GDMT  Dementia Delirium - Worsening confusion in the last few weeks months per family - Palliative consult for goals of care, discussed with daughters, they are thinking about hospice  Hypokalemia Repleted, mag is acceptable  Persistent atrial fibrillation (HCC) Rate controlled at present Continue diltiazem , hold Eliquis  for thoracentesis, resume tomorrow  COPD (chronic obstructive pulmonary disease) (HCC) Continue as needed Xopenex   S/P CABG (coronary artery bypass graft) -Stable, has no symptoms at this time, continue asa and statin  S/P AVR (aortic valve replacement) History of bioprosthetic aortic valve prosthetic valve stenosis, now s/p TAVR  had a 21mm Magna valve implanted in 2007, which degenerated and became stenotic.   On 05/01/2020, she underwent TAVR  Followed by [facility]  On Eliquis    Hyperlipidemia Cont home crestor   DVT prophylaxis: Eliquis -on hold now Code Status: DNR Family Communication: Daughter at bedside today Disposition Plan: Home pending improvement in symptoms  Consultants:    Procedures:   Antimicrobials:    Objective: Vitals:   08/08/24 2300 08/09/24 0100 08/09/24 0300 08/09/24 0733  BP: (!) 123/47  (!) 110/48 (!) 132/48  Pulse: 77  73 86  Resp: 20 (!) 28 (!) 22 20  Temp: 98.5 F (36.9 C)  98.4 F (36.9 C) 98.6 F (37 C)  TempSrc: Oral  Oral Oral  SpO2: 90% 97% 90% 99%  Weight:   52.6 kg   Height:   5' 4 (1.626 m)     Intake/Output Summary (Last 24 hours) at 08/09/2024 0958 Last data filed at 08/08/2024 2300 Gross per 24 hour  Intake 960 ml  Output 150 ml  Net 810 ml   Filed Weights   08/08/24 0045 08/08/24 0530 08/09/24 0300  Weight: 52.9 kg 52.9 kg 52.6 kg    Examination:  General exam: Elderly chronically ill, AAO x 2, moderate cognitive deficits, no distress HEENT: No JVD Respiratory system: decreased breath sounds in the left Cardiovascular system: S1 & S2 heard, RRR.  Abd: nondistended, soft and nontender.Normal bowel sounds heard. Central nervous system: Alert and oriented. No focal neurological deficits. Extremities: No edema Skin: No rashes Psychiatry: Has not, mild confusion    Data Reviewed:   CBC: Recent Labs  Lab 08/06/24 2136 08/08/24 0245 08/09/24 0255  WBC 7.1 8.1 8.9  NEUTROABS  --  6.4  --   HGB 9.6* 9.8* 9.5*  HCT 32.0* 33.4* 31.7*  MCV 70.2* 71.4* 70.9*  PLT 278 265 250   Basic Metabolic Panel: Recent Labs  Lab 08/06/24 2136 08/07/24 1613 08/08/24 0245 08/09/24 0255  NA 136 138 139 136  K 2.8* 2.8* 3.9 4.6  CL 95* 99 102 99  CO2 28 29 30 26   GLUCOSE 122* 135* 160* 133*  BUN 13 11 12 15   CREATININE 1.25* 1.21* 1.23* 1.57*  CALCIUM  9.1 9.1 9.0 9.2  MG  --  1.9 2.0  --    GFR: Estimated Creatinine Clearance: 21 mL/min (A) (by C-G  formula based on SCr of 1.57 mg/dL (H)). Liver Function Tests: Recent Labs  Lab 08/07/24 1613  AST 26  ALT 10  ALKPHOS 78  BILITOT 1.0  PROT 6.1*  ALBUMIN 3.7   No results for input(s): LIPASE, AMYLASE in the last 168 hours. No results for input(s): AMMONIA in the last 168 hours. Coagulation Profile: No results for input(s): INR, PROTIME in the last 168 hours. Cardiac Enzymes: No results for input(s): CKTOTAL, CKMB, CKMBINDEX, TROPONINI in the last 168 hours. BNP (last 3 results) Recent Labs    08/06/24 2136 08/08/24 0245  PROBNP 6,114.0* 5,865.0*   HbA1C: No results for input(s): HGBA1C in the last 72 hours. CBG: No results for input(s): GLUCAP in the last 168 hours. Lipid Profile: No results for input(s): CHOL, HDL, LDLCALC, TRIG, CHOLHDL, LDLDIRECT in the last 72 hours. Thyroid Function Tests: No results for input(s): TSH, T4TOTAL, FREET4, T3FREE, THYROIDAB in the last 72 hours. Anemia Panel: No results for input(s): VITAMINB12, FOLATE, FERRITIN, TIBC, IRON, RETICCTPCT in the last 72 hours. Urine analysis:    Component Value Date/Time   COLORURINE YELLOW 08/07/2024 0128   APPEARANCEUR CLEAR 08/07/2024 0128   LABSPEC 1.015 08/07/2024 0128   PHURINE 6.5 08/07/2024 0128   GLUCOSEU NEGATIVE 08/07/2024 0128   HGBUR MODERATE (A) 08/07/2024 0128   BILIRUBINUR NEGATIVE 08/07/2024 0128   KETONESUR NEGATIVE 08/07/2024 0128   PROTEINUR NEGATIVE 08/07/2024 0128   NITRITE NEGATIVE 08/07/2024 0128   LEUKOCYTESUR TRACE (A) 08/07/2024 0128   Sepsis Labs: @LABRCNTIP (procalcitonin:4,lacticidven:4)  )No results found for this or any previous visit (from the past 240 hours).   Radiology Studies: ECHOCARDIOGRAM COMPLETE Result Date: 08/08/2024    ECHOCARDIOGRAM REPORT   Patient Name:   Tracy Buck Date of Exam: 08/08/2024 Medical Rec #:  [id number]    Height:       64.0 in Accession #:    [id number]   Weight:       116.6  lb Date of Birth:  05-21-1935    BSA:          1.556 m Patient Age:    89 years     BP:           134/60 mmHg Patient Gender: F            HR:           85 bpm. Exam Location:  Inpatient Procedure: 2D Echo, Cardiac Doppler and Color Doppler (Both Spectral and Color            Flow Doppler were utilized during procedure). Indications:    CHF I50.9  History:        Patient has prior history of Echocardiogram examinations, most                 recent 11/05/2020. Signs/Symptoms:Hypertensive Heart Disease.  Sonographer:    Nathanel Devonshire Referring Phys: [phone] STEVEN J NEWTON IMPRESSIONS  1. Left ventricular ejection fraction, by estimation, is 40 to 45%. The left ventricle has mildly decreased function. The left ventricle demonstrates regional  wall motion abnormalities (see scoring diagram/findings for description). There is mild asymmetric left ventricular hypertrophy of the basal-septal segment. Left ventricular diastolic parameters are indeterminate.  2. Right ventricular systolic function is normal. The right ventricular size is normal. There is mildly elevated pulmonary artery systolic pressure. The estimated right ventricular systolic pressure is 38.3 mmHg.  3. Left atrial size was moderately dilated.  4. Right atrial size was severely dilated.  5. Moderate pleural effusion in the left lateral region.  6. The mitral valve is degenerative. Trivial mitral valve regurgitation. No evidence of mitral stenosis.  7. Tricuspid valve regurgitation is moderate to severe.  8. S/p TAVR 23mm Evolut Pro VIV TAVR (previous Magna Ease). Gradients stable based on previous echo report. The aortic valve has been repaired/replaced. Aortic valve regurgitation is mild. No aortic stenosis is present. Echo findings are consistent with  normal structure and function of the aortic valve prosthesis. Aortic valve area, by VTI measures 1.49 cm. Aortic valve mean gradient measures 8.0 mmHg. Aortic valve Vmax measures 1.95 m/s.  9. The inferior vena  cava is normal in size with greater than 50% respiratory variability, suggesting right atrial pressure of 3 mmHg. 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 Recommend repeating study with Definity  contrast. FINDINGS  Left Ventricle: Left ventricular ejection fraction, by estimation, is 40 to 45%. The left ventricle has mildly decreased function. The left ventricle demonstrates regional wall motion abnormalities. The left ventricular internal cavity size was normal in size. There is mild asymmetric left ventricular hypertrophy of the basal-septal segment. Left ventricular diastolic parameters are indeterminate.  LV Wall Scoring: The apical septal segment is dyskinetic. The mid inferoseptal segment, apical inferior segment, and apex are akinetic. The inferior wall and apical lateral segment are hypokinetic. Right Ventricle: The right ventricular size is normal. No increase in right ventricular wall thickness. Right ventricular systolic function is normal. There is mildly elevated pulmonary artery systolic pressure. The tricuspid regurgitant velocity is 2.97  m/s, and with an assumed right atrial pressure of 3 mmHg, the estimated right ventricular systolic pressure is 38.3 mmHg. Left [facility]: Left atrial size was moderately dilated. Right [facility]: Right atrial size was severely dilated. Pericardium: There is no evidence of pericardial effusion. Mitral Valve: The mitral valve is degenerative in appearance. Mild mitral annular calcification. Trivial mitral valve regurgitation. No evidence of mitral valve stenosis. Tricuspid Valve: The tricuspid valve is normal in structure. Tricuspid valve regurgitation is moderate to severe. No evidence of tricuspid stenosis. Aortic Valve: S/p TAVR 23mm Evolut Pro VIV TAVR (previous Magna Ease). Gradients stable based on previous echo report. The aortic valve has been repaired/replaced. Aortic valve regurgitation is mild. No aortic stenosis is present. Aortic valve  mean gradient measures 8.0 mmHg. Aortic valve peak gradient measures 15.2 mmHg. Aortic valve area, by VTI measures 1.49 cm. There is a 23 mm CoreValve-Evolut Pro prosthetic, stented (TAVR) valve present in the aortic position. Echo findings are consistent with normal structure and function of the aortic valve prosthesis. Pulmonic Valve: The pulmonic valve was normal in structure. Pulmonic valve regurgitation is not visualized. No evidence of pulmonic stenosis. Aorta: The aortic root is normal in size and structure. Venous: The inferior vena cava is normal in size with greater than 50% respiratory variability, suggesting right atrial pressure of 3 mmHg. IAS/Shunts: No atrial level shunt detected by color flow Doppler. Additional Comments: There is a moderate pleural effusion in the left lateral region.  LEFT VENTRICLE PLAX 2D LVIDd:  5.95 cm     Diastology LVIDs:         4.50 cm     LV e' medial:    4.79 cm/s LV PW:         0.95 cm     LV E/e' medial:  23.2 LV IVS:        0.95 cm     LV e' lateral:   7.51 cm/s LVOT diam:     1.80 cm     LV E/e' lateral: 14.8 LV SV:         45 LV SV Index:   29 LVOT Area:     2.54 cm  LV Volumes (MOD) LV vol d, MOD A2C: 66.8 ml LV vol d, MOD A4C: 85.3 ml LV vol s, MOD A2C: 41.7 ml LV vol s, MOD A4C: 51.5 ml LV SV MOD A2C:     25.1 ml LV SV MOD A4C:     85.3 ml LV SV MOD BP:      33.1 ml RIGHT VENTRICLE            IVC RV Basal diam:  4.10 cm    IVC diam: 1.80 cm RV S prime:     8.59 cm/s TAPSE (M-mode): 1.8 cm LEFT ATRIUM             Index        RIGHT ATRIUM           Index LA diam:        3.40 cm 2.19 cm/m   RA Area:     20.10 cm LA Vol (A2C):   70.7 ml 45.45 ml/m  RA Volume:   65.70 ml  42.23 ml/m LA Vol (A4C):   50.3 ml 32.33 ml/m LA Biplane Vol: 62.6 ml 40.24 ml/m  AORTIC VALVE AV Area (Vmax):    1.47 cm AV Area (Vmean):   1.19 cm AV Area (VTI):     1.49 cm AV Vmax:           195.00 cm/s AV Vmean:          127.000 cm/s AV VTI:            0.302 m AV Peak Grad:       15.2 mmHg AV Mean Grad:      8.0 mmHg LVOT Vmax:         113.00 cm/s LVOT Vmean:        59.600 cm/s LVOT VTI:          0.177 m LVOT/AV VTI ratio: 0.59  AORTA Ao Root diam: 2.40 cm Ao Asc diam:  2.90 cm MITRAL VALVE                TRICUSPID VALVE MV Area (PHT): 4.63 cm     TR Peak grad:   35.3 mmHg MV Decel Time: 164 msec     TR Vmax:        297.00 cm/s MV E velocity: 111.00 cm/s MV A velocity: 23.60 cm/s   SHUNTS MV E/A ratio:  4.70         Systemic VTI:  0.18 m                             Systemic Diam: 1.80 cm Toribio Fuel MD Electronically signed by Toribio Fuel MD Signature Date/Time: 08/08/2024/7:51:47 PM    Final    DG Chest Port 1 View Result Date: 08/08/2024 EXAM:  1 VIEW(S) XRAY OF THE CHEST 08/08/2024 05:40:22 AM COMPARISON: 08/06/2024 CLINICAL HISTORY: SOB (shortness of breath) FINDINGS: LUNGS AND PLEURA: Moderate left pleural effusion. Small right pleural effusion. Increased bilateral interstitial and patchy opacities. Left basilar consolidation. No pneumothorax. HEART AND MEDIASTINUM: Cardiomegaly. Aortic valve replacement and aortic stent noted. Atherosclerotic calcifications. BONES AND SOFT TISSUES: Median sternotomy noted. Partially imaged cervicothoracic fixation hardware. No acute osseous abnormality. IMPRESSION: 1. Moderate left pleural effusion and small right pleural effusion. 2. Increased bilateral interstitial and patchy opacities with left basilar consolidation. 3. Cardiomegaly with aortic valve replacement and aortic stent. Atherosclerotic calcifications. Electronically signed by: Evalene Coho MD 08/08/2024 05:54 AM EST RP Workstation: [id number]     Scheduled Meds:  diltiazem   120 mg Oral Daily   furosemide   40 mg Oral Daily   rosuvastatin   20 mg Oral Daily   Continuous Infusions:   LOS: 1 day    Time spent:    Sigurd Pac, MD [facility]   08/09/2024, 9:58 AM

## 2024-08-09 NOTE — Consult Note (Signed)
 Consultation Note Date: 08/09/2024   Patient Name: Tracy Buck  DOB: [birth year]  MRN: [id number]  Age / Sex: 89 y.o., female  PCP: Ilda Lang RIGGERS Referring Physician: Fairy Frames, MD  Reason for Consultation: Establishing goals of care  HPI/Patient Profile: 89 y.o. female  with past medical history of diastolic CHF, COPD, CABG, TAVR, and dementia admitted on 08/07/2024 with acute on chronic HF. PMT consulted for GOC.   Clinical Assessment and Goals of Care: I have reviewed medical records including EPIC notes, labs and imaging,  assessed the patient and then met with patient's daughter Tracy Buck  to discuss diagnosis prognosis, GOC, EOL wishes, disposition and options.  I introduced Palliative Medicine as specialized medical care for people living with serious illness. It focuses on providing relief from the symptoms and stress of a serious illness. The goal is to improve quality of life for both the patient and the family.  Patient has been living with her daughter Tracy Buck.   As far as functional and nutritional status, patient has remained ambulatory but doesn't walk much - furniture surfs at home to the bathroom. Declining appetite. More confusion and significant panic attacks complicating her care.    We discussed patient's current illness and what it means in the larger context of patient's on-going co-morbidities.  Natural disease trajectory and expectations at EOL were discussed. We reviewed her chronic disease burden as well as decline in quality of life.   I attempted to elicit values and goals of care important to the patient.  Daughter shares she wants focus to be on whatever time is left to be good time. Wants to focus on symptom management. Wants to avoid multiple medical visits and fragmented care.   We reviewed home health vs hospice support outpatient. After describing each thoroughly daughter feels hospice support most  aligns with their goals - she agrees to hospice referral. [facility]   She tells me they are working on ALF placement - she is touring [employer] today.   Discussed with daughter the importance of continued conversation with family and the medical providers regarding overall plan of care and treatment options, ensuring decisions are within the context of the patients values and GOCs.    Questions and concerns were addressed. The family was encouraged to call with questions or concerns.     SUMMARY OF RECOMMENDATIONS   Will refer to hospice - daughter's request, TOC aware, Dr. Fairy aware Continue current measures to stabilize patient best we can prior to dc  Code Status/Advance Care Planning: DNR      Primary Diagnoses: Present on Admission:  Acute diastolic heart failure (HCC)  Persistent atrial fibrillation (HCC)  COPD (chronic obstructive pulmonary disease) (HCC)  Hyperlipidemia   I have reviewed the medical record, interviewed the patient and family, and examined the patient. The following aspects are pertinent.  Past Medical History:  Diagnosis Date   Arthritis    fingers, knees (09/16/2016)   Complication of anesthesia    I often go into severe panic attacks when they try to bring me out and they have to put me back under; sometimes happens when they are starting the anesthesia also (09/16/2016)   COPD (chronic obstructive pulmonary disease) (HCC)    I think this has been recently dx'd (09/16/2016)   Fall 03/02/2021   Forearm laceration 03/02/2021   Forehead abrasion 03/02/2021   Heart valve replaced    Hyperlipidemia    Hypertension    LBBB (left  bundle branch block)    Lung cancer (HCC) 2013   Lung mass    Myocardial infarct [facility])    Panic attack    Pneumonia    when I was very young (09/16/2016)   Rib fracture    Skin cancer of nose    Vaccine counseling 10/28/2021   Social History   Socioeconomic History    Marital status: Widowed    Spouse name: Not on file   Number of children: Not on file   Years of education: Not on file   Highest education level: Not on file  Occupational History   Not on file  Tobacco Use   Smoking status: Never   Smokeless tobacco: Never  Substance and Sexual Activity   Alcohol use: Yes    Comment: 09/16/2016 2-3 glasses of wine when at the beach; once/year   Drug use: No   Sexual activity: Not Currently    Birth control/protection: Abstinence  Other Topics Concern   Not on file  Social History Narrative   Not on file   Social Drivers of Health   Tobacco Use: Low Risk (08/06/2024)   Patient History    Smoking Tobacco Use: Never    Smokeless Tobacco Use: Never    Passive Exposure: Not on file  Financial Resource Strain: Not on file  Food Insecurity: No Food Insecurity (08/07/2024)   Epic    Worried About Programme Researcher, Broadcasting/film/video in the Last Year: Never true    Ran Out of Food in the Last Year: Never true  Transportation Needs: No Transportation Needs (08/07/2024)   Epic    Lack of Transportation (Medical): No    Lack of Transportation (Non-Medical): No  Physical Activity: Not on file  Stress: Not on file  Social Connections: Moderately Integrated (08/07/2024)   Social Connection and Isolation Panel    Frequency of Communication with Friends and Family: More than three times a week    Frequency of Social Gatherings with Friends and Family: More than three times a week    Attends Religious Services: 1 to 4 times per year    Active Member of [employer] or Organizations: Yes    Attends Banker Meetings: 1 to 4 times per year    Marital Status: Widowed  Depression (PHQ2-9): Not on file  Alcohol Screen: Not on file  Housing: Low Risk (08/07/2024)   Epic    Unable to Pay for Housing in the Last Year: No    Number of Times Moved in the Last Year: 0    Homeless in the Last Year: No  Utilities: Not At Risk (08/07/2024)   Epic    Threatened with  loss of utilities: No  Health Literacy: Not on file   Family History  Problem Relation Age of Onset   Transient ischemic attack Mother    Colon cancer Father    Scheduled Meds:  diltiazem   120 mg Oral Daily   furosemide   40 mg Oral Daily   rosuvastatin   20 mg Oral Daily   spironolactone   12.5 mg Oral Daily   Continuous Infusions: PRN Meds:.levalbuterol , metoprolol  tartrate, ondansetron  **OR** ondansetron  (ZOFRAN ) IV Allergies[1] Review of Systems  Unable to perform ROS: Other  Patient asleep - dtr requests we not wake  Physical Exam Constitutional:      General: She is not in acute distress.    Appearance: She is ill-appearing.     Comments: Sleeping - dtr asks we not wake  Pulmonary:  Effort: Pulmonary effort is normal.  Skin:    General: Skin is warm and dry.     Vital Signs: BP 138/67 (BP Location: Left Wrist)   Pulse 86   Temp 98.6 F (37 C) (Oral)   Resp 20   Ht 5' 4 (1.626 m)   Wt 52.6 kg   SpO2 95%   BMI 19.90 kg/m  Pain Scale: 0-10   Pain Score: 0-No pain   SpO2: SpO2: 95 % O2 Device:SpO2: 95 % O2 Flow Rate: .O2 Flow Rate (L/min): 5 L/min  IO: Intake/output summary:  Intake/Output Summary (Last 24 hours) at 08/09/2024 1057 Last data filed at 08/08/2024 2300 Gross per 24 hour  Intake 960 ml  Output 150 ml  Net 810 ml    LBM: Last BM Date :  (PTA) Baseline Weight: Weight: 60.1 kg Most recent weight: Weight: 52.6 kg      *Please note that this is a verbal dictation therefore any spelling or grammatical errors are due to the [employer] One system interpretation.   Time Total: 50 minutes Time spent includes: Detailed review of medical records (labs, imaging, vital signs), medically appropriate exam, discussion with treatment team, counseling and educating patient, family and/or staff, documenting clinical information, medication management and coordination of care.    Tobey Jama Barnacle, DNP, AGNP-C Palliative Medicine  Team [phone] Pager: [phone]      [1]  Allergies Allergen Reactions   Fig Extract [Ficus] Anaphylaxis   Citalopram Hydrobromide Other (See Comments)    Pt unsure if this is accurate   Sulfa Antibiotics Other (See Comments)    Severe nervousness.   Prednisone Other (See Comments)    Other reaction(s): Other (See Comments) So nervous I could climb the walls So nervous I could climb the walls

## 2024-08-09 NOTE — Procedures (Signed)
 PROCEDURE SUMMARY:  Successful image-guided left-sided diagnostic and therapeutic thoracentesis. Yielded 0.50 liters of clear, amber-colored pleural fluid. Patient tolerated procedure well. EBL: Zero No immediate complications.  Specimen was sent for labs. Post procedure CXR is currently pending.  Please see imaging section of Epic for full dictation.  Carlin LABOR Keyston Ardolino PA-C 08/09/2024 11:52 AM

## 2024-08-10 DIAGNOSIS — I5031 Acute diastolic (congestive) heart failure: Secondary | ICD-10-CM | POA: Diagnosis not present

## 2024-08-10 LAB — BASIC METABOLIC PANEL WITH GFR
Anion gap: 12 (ref 5–15)
BUN: 20 mg/dL (ref 8–23)
CO2: 27 mmol/L (ref 22–32)
Calcium: 9.2 mg/dL (ref 8.9–10.3)
Chloride: 99 mmol/L (ref 98–111)
Creatinine, Ser: 1.63 mg/dL — ABNORMAL HIGH (ref 0.44–1.00)
GFR, Estimated: 30 mL/min — ABNORMAL LOW
Glucose, Bld: 115 mg/dL — ABNORMAL HIGH (ref 70–99)
Potassium: 4.5 mmol/L (ref 3.5–5.1)
Sodium: 138 mmol/L (ref 135–145)

## 2024-08-10 LAB — PROTEIN, BODY FLUID (OTHER): Total Protein, Body Fluid Other: 1.7 g/dL

## 2024-08-10 LAB — LD, BODY FLUID (OTHER): LD, Body Fluid: 93 IU/L

## 2024-08-10 LAB — PATHOLOGIST SMEAR REVIEW: Path Review: REACTIVE

## 2024-08-10 LAB — CYTOLOGY - NON PAP

## 2024-08-10 MED ORDER — APIXABAN 2.5 MG PO TABS
2.5000 mg | ORAL_TABLET | Freq: Two times a day (BID) | ORAL | Status: DC
  Administered 2024-08-10 – 2024-08-13 (×7): 2.5 mg via ORAL
  Filled 2024-08-10 (×7): qty 1

## 2024-08-10 NOTE — NC FL2 (Cosign Needed)
 " [place]  MEDICAID FL2 LEVEL OF CARE FORM     IDENTIFICATION  Patient Name: Tracy Buck Birthdate: 1934-12-16 Sex: female Admission Date (Current Location): 08/07/2024  [facility] and [place] Number:  Producer, Television/film/video and Address:  [facility], [address], [place], [place] [address]      Provider Number: [id number]  Attending Physician Name and Address:  Fairy Frames, MD  Relative Name and Phone Number:       Current Level of Care: Hospital Recommended Level of Care: Skilled Nursing Facility Prior Approval Number:    Date Approved/Denied:   PASRR Number: [id number]  Discharge Plan: SNF    Current Diagnoses: Patient Active Problem List   Diagnosis Date Noted   Acute diastolic heart failure (HCC) 08/07/2024   Acute on chronic heart failure with preserved ejection fraction (HFpEF) (HCC) 08/07/2024   Hypokalemia 08/07/2024   Vaccine counseling 10/28/2021   Fall against object 03/02/2021   Forehead abrasion 03/02/2021   Forearm laceration 03/02/2021   Pleural effusion 12/15/2020   Persistent atrial fibrillation (HCC)    S/P PICC central line placement    Sepsis with acute renal failure without septic shock (HCC)    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/2020   Fatigue    Bacteremia    Acute renal failure    Malnutrition of moderate degree 11/07/2020   Acute respiratory failure with hypoxia (HCC) 11/05/2020   Lactic acidosis    Elevated troponin    Atrial fibrillation with RVR (HCC)    Severe sepsis with septic shock (HCC)    Cholecystitis 09/16/2016   S/P AVR (aortic valve replacement) 09/16/2016   S/P CABG (coronary artery bypass graft) 09/16/2016   Essential hypertension 09/16/2016   COPD (chronic obstructive pulmonary disease) (HCC) 09/16/2016   Hyperglycemia 09/16/2016   Pre-op evaluation    Hyperlipidemia     Orientation RESPIRATION BLADDER Height & Weight     Self, Place, Time (fluctuating orientation)  Normal Incontinent  Weight: 114 lb 6.7 oz (51.9 kg) Height:  5' 4 (162.6 cm)  BEHAVIORAL SYMPTOMS/MOOD NEUROLOGICAL BOWEL NUTRITION STATUS      Continent Diet (Regular;Thin liquids)  AMBULATORY STATUS COMMUNICATION OF NEEDS Skin   Limited Assist Verbally Normal                       Personal Care Assistance Level of Assistance  Bathing, Feeding, Dressing Bathing Assistance: Limited assistance Feeding assistance: Limited assistance Dressing Assistance: Limited assistance     Functional Limitations Info  Sight, Hearing, Speech Sight Info: Adequate Hearing Info: Adequate Speech Info: Adequate    SPECIAL CARE FACTORS FREQUENCY  PT (By licensed PT), OT (By licensed OT)                    Contractures Contractures Info: Not present    Additional Factors Info  Code Status Code Status Info: DNR             Current Medications (08/10/2024):  This is the current hospital active medication list Current Facility-Administered Medications  Medication Dose Route Frequency Provider Last Rate Last Admin   apixaban  (ELIQUIS ) tablet 2.5 mg  2.5 mg Oral BID Joseph, Preetha, MD       diltiazem  (CARDIZEM  CD) 24 hr capsule 120 mg  120 mg Oral Daily Eldonna Elspeth PARAS, MD   120 mg at 08/10/24 0955   furosemide  (LASIX ) tablet 40 mg  40 mg Oral Daily Joseph, Preetha, MD   40 mg at 08/10/24  9045   levalbuterol  (XOPENEX ) nebulizer solution 0.63 mg  0.63 mg Nebulization Q6H PRN Newton, Steven J, MD       metoprolol  tartrate (LOPRESSOR ) injection 5 mg  5 mg Intravenous Q4H PRN Howerter, Justin B, DO   5 mg at 08/08/24 0453   ondansetron  (ZOFRAN ) tablet 4 mg  4 mg Oral Q6H PRN Eldonna Elspeth PARAS, MD       Or   ondansetron  (ZOFRAN ) injection 4 mg  4 mg Intravenous Q6H PRN Newton, Steven J, MD   4 mg at 08/07/24 2156   rosuvastatin  (CRESTOR ) tablet 20 mg  20 mg Oral Daily Eldonna Elspeth PARAS, MD   20 mg at 08/10/24 9045   spironolactone  (ALDACTONE ) tablet 12.5 mg  12.5 mg Oral Daily Joseph, Preetha, MD   12.5 mg at  08/10/24 9045     Discharge Medications: Please see discharge summary for a list of discharge medications.  Relevant Imaging Results:  Relevant Lab Results:   Additional Information SS# [id number]  Gwenn Julien Norris, [place]     "

## 2024-08-10 NOTE — TOC Progression Note (Addendum)
 Transition of Care [facility]) - Progression Note    Patient Details  Name: Tracy Buck MRN: [id number] Date of Birth: 09-05-1934  Transition of [facility]) CM/SW Contact  Luise JAYSON Pan, [place] Phone Number: 08/10/2024, 10:51 AM  Clinical Narrative:   Per daily meeting with treatment team, family wants patient placed at an ALF and family has toured [facility]. Patient from home with daughter. CSW followed up with [name], pts dtr, at [phone] to discuss that ALF placement is not something our team typically assist with but CSW inquired if family picked out an ALF already. Per [name], family tours [facility] on [address] and they will tour other facilities. [name] stated family has not been given a timeline for discharge. CSW inquired that since [facility] does not typically assist with ALF placement would family be interested in SNF placement while they finalize ALF placement as patients EDD is 12/21. [name] stated that family prefers not to move patient to too many different facilities and family has been awaiting to hear about patients procedure for removing fluid. CSW to discuss with treatment team.   11:35 AM CSW and RNCM left VM for patients daughter.  4:23 PM CSW and RNCM met patient and daughter at bedside and daughter is agreeable to SNF at this time. CSW explained that patient can go to SNF and transition to ALF. CSW informed patient can get a TB test done with her PCP or at an urgent care. CSW to provide medicare.gov list of current accepting SNFs.   4:35 PM Family stated they would like patient to go to [facility] at this time.  4:39 PM Brittany with [facility] informed CSW that facility will not have a bed until later next week. TOC to follow up with daughter.  CSW will continue to follow.                      Expected Discharge Plan and Services                                               Social Drivers of Health (SDOH) Interventions SDOH Screenings    Food Insecurity: No Food Insecurity (08/07/2024)  Housing: Low Risk (08/07/2024)  Transportation Needs: No Transportation Needs (08/07/2024)  Utilities: Not At Risk (08/07/2024)  Social Connections: Moderately Integrated (08/07/2024)  Tobacco Use: Low Risk (08/06/2024)    Readmission Risk Interventions     No data to display

## 2024-08-10 NOTE — Plan of Care (Signed)
 ?  Problem: Clinical Measurements: ?Goal: Will remain free from infection ?Outcome: Progressing ?  ?

## 2024-08-10 NOTE — Plan of Care (Signed)
 Palliative:  Chart reviewed. Daughters are interested in hospice care. Updated by [facility] today that patient is ready for discharge and ALF placement has not been secured yet; therefore, plan is to dc to SNF with palliative to follow then transition to ALF with hospice when room is ready. Discussed with hospice liaison who has been communicating with patient family.  No outstanding palliative needs. Goals clear.   Please reach out if needs arise.  Tobey Jama Barnacle, DNP, AGNP-C Palliative Medicine Team Team Phone # [phone]  Pager # [phone]

## 2024-08-10 NOTE — Progress Notes (Signed)
 " PROGRESS NOTE    Tracy Buck  FMW:[id number] DOB: [birth year] DOA: 08/07/2024 PCP: Ilda Cadet, PA-C  89/F with history of diastolic CHF, COPD, CABG, TAVR, dementia was brought to the ED by family with increased work of breathing, lower extremity edema for few weeks.  Family reports poor compliance with diet, excessive salt intake around Thanksgiving, worsening orthopnea, in the ED she was tachypneic, mildly tachycardic, hypoxic labs noted WBC of 7.1, hemoglobin 9, creatinine 1.2, proBNP 6114, chest x-ray with pleural effusions. - Admitted, started on diuretics - 12/18, underwent thoracentesis, palliative consulted - Plan for hospice services at ALF  Subjective: - Fair this morning sitting up in bed eating breakfast  Assessment and Plan:  Acute on chronic heart failure with preserved ejection fraction (HFpEF) (HCC) Moderate left pleural effusion -Last echo 3/22 noted EF 60-65%, normal RV, mildly elevated PASP - Repeat x-ray with moderate left effusion and small right effusion, increased bilateral interstitial and patchy opacities -Clinical suspicion for pneumonia is low, hold off on antibiotics - Switch to oral diuretics now, underwent thoracentesis yesterday, will restart apixaban  -Started on low-dose Aldactone , caution with excess GDMT  Dementia Delirium - Worsening confusion in the last few weeks months per family - Palliative consult for goals of care, discussed with daughters, family wanting ALF with hospice  Hypokalemia Repleted, mag is acceptable  Persistent atrial fibrillation (HCC) Rate controlled at present Continue diltiazem , Eliquis  on hold for thoracentesis will restart today  COPD (chronic obstructive pulmonary disease) (HCC) Continue as needed Xopenex   S/P CABG (coronary artery bypass graft) -Stable, has no symptoms at this time, continue asa and statin  S/P AVR (aortic valve replacement) History of bioprosthetic aortic valve prosthetic valve stenosis, now  s/p TAVR  had a 21mm Magna valve implanted in 2007, which degenerated and became stenotic.   On 05/01/2020, she underwent TAVR  Followed by [facility]  On Eliquis    Hyperlipidemia Cont home crestor   DVT prophylaxis: Eliquis - Code Status: DNR Family Communication: Daughter at bedside today yesterday Disposition Plan: To be determined, family wants ALF with hospice, medically stable for discharge  Consultants:    Procedures:   Antimicrobials:    Objective: Vitals:   08/09/24 1900 08/09/24 2300 08/10/24 0300 08/10/24 0806  BP: (!) 120/47 (!) 138/51 (!) 128/49 (!) 124/47  Pulse: 81 76 70   Resp: 20 20 20 20   Temp: 98.2 F (36.8 C) 98.5 F (36.9 C) 97.7 F (36.5 C) 98.4 F (36.9 C)  TempSrc: Oral Oral Oral Oral  SpO2: 93% 100% 100% 92%  Weight:   51.9 kg   Height:   5' 4 (1.626 m)     Intake/Output Summary (Last 24 hours) at 08/10/2024 0933 Last data filed at 08/10/2024 0806 Gross per 24 hour  Intake 240 ml  Output --  Net 240 ml   Filed Weights   08/08/24 0530 08/09/24 0300 08/10/24 0300  Weight: 52.9 kg 52.6 kg 51.9 kg    Examination:  General exam: Elderly chronically ill, AAO x 2, moderate cognitive deficits, no distress HEENT: No JVD CVS: S1-S2, regular rhythm Lungs: Decreased breath sounds to bases Abdomen: Soft, nontender, bowel sounds present  Extremities: No edema Skin: No rashes Psychiatry:mild confusion    Data Reviewed:   CBC: Recent Labs  Lab 08/06/24 2136 08/08/24 0245 08/09/24 0255  WBC 7.1 8.1 8.9  NEUTROABS  --  6.4  --   HGB 9.6* 9.8* 9.5*  HCT 32.0* 33.4* 31.7*  MCV 70.2* 71.4* 70.9*  PLT 278  265 250   Basic Metabolic Panel: Recent Labs  Lab 08/06/24 2136 08/07/24 1613 08/08/24 0245 08/09/24 0255 08/10/24 0329  NA 136 138 139 136 138  K 2.8* 2.8* 3.9 4.6 4.5  CL 95* 99 102 99 99  CO2 28 29 30 26 27   GLUCOSE 122* 135* 160* 133* 115*  BUN 13 11 12 15 20   CREATININE 1.25* 1.21* 1.23* 1.57* 1.63*   CALCIUM  9.1 9.1 9.0 9.2 9.2  MG  --  1.9 2.0  --   --    GFR: Estimated Creatinine Clearance: 19.9 mL/min (A) (by C-G formula based on SCr of 1.63 mg/dL (H)). Liver Function Tests: Recent Labs  Lab 08/07/24 1613  AST 26  ALT 10  ALKPHOS 78  BILITOT 1.0  PROT 6.1*  ALBUMIN 3.7   No results for input(s): LIPASE, AMYLASE in the last 168 hours. No results for input(s): AMMONIA in the last 168 hours. Coagulation Profile: No results for input(s): INR, PROTIME in the last 168 hours. Cardiac Enzymes: No results for input(s): CKTOTAL, CKMB, CKMBINDEX, TROPONINI in the last 168 hours. BNP (last 3 results) Recent Labs    08/06/24 2136 08/08/24 0245  PROBNP 6,114.0* 5,865.0*   HbA1C: No results for input(s): HGBA1C in the last 72 hours. CBG: No results for input(s): GLUCAP in the last 168 hours. Lipid Profile: No results for input(s): CHOL, HDL, LDLCALC, TRIG, CHOLHDL, LDLDIRECT in the last 72 hours. Thyroid Function Tests: No results for input(s): TSH, T4TOTAL, FREET4, T3FREE, THYROIDAB in the last 72 hours. Anemia Panel: No results for input(s): VITAMINB12, FOLATE, FERRITIN, TIBC, IRON, RETICCTPCT in the last 72 hours. Urine analysis:    Component Value Date/Time   COLORURINE YELLOW 08/07/2024 0128   APPEARANCEUR CLEAR 08/07/2024 0128   LABSPEC 1.015 08/07/2024 0128   PHURINE 6.5 08/07/2024 0128   GLUCOSEU NEGATIVE 08/07/2024 0128   HGBUR MODERATE (A) 08/07/2024 0128   BILIRUBINUR NEGATIVE 08/07/2024 0128   KETONESUR NEGATIVE 08/07/2024 0128   PROTEINUR NEGATIVE 08/07/2024 0128   NITRITE NEGATIVE 08/07/2024 0128   LEUKOCYTESUR TRACE (A) 08/07/2024 0128   Sepsis Labs: @LABRCNTIP (procalcitonin:4,lacticidven:4)  ) Recent Results (from the past 240 hours)  Body fluid culture w Gram Stain     Status: None (Preliminary result)   Collection Time: 08/09/24 11:44 AM   Specimen: Lung, Left Lower Lobe; Pleural Fluid   Result Value Ref Range Status   Specimen Description PLEURAL  Final   Special Requests LEFT LOWER LOBE  Final   Gram Stain   Final    RARE WBC PRESENT, PREDOMINANTLY MONONUCLEAR NO ORGANISMS SEEN Performed at [facility], [address], [place], [place] [address]    Culture PENDING  Incomplete   Report Status PENDING  Incomplete     Radiology Studies: IR THORACENTESIS ASP PLEURAL SPACE W/IMG GUIDE Result Date: 08/09/2024 INDICATION: 89 year old female enters admitted with acute on chronic heart failure, with moderate left-sided pleural effusion. IR is requested for diagnostic and therapeutic left-sided thoracentesis. EXAM: ULTRASOUND GUIDED DIAGNOSTIC AND THERAPEUTIC LEFT-SIDED THORACENTESIS MEDICATIONS: 4 mL of 1% lidocaine . COMPLICATIONS: None immediate. PROCEDURE: An ultrasound guided thoracentesis was thoroughly discussed with the patient and questions answered. The benefits, risks, alternatives and complications were also discussed. The patient understands and wishes to proceed with the procedure. Written consent was obtained. Ultrasound was performed to localize and mark an adequate pocket of fluid in the left chest. The area was then prepped and draped in the normal sterile fashion. 1% Lidocaine  was used for local anesthesia. Under  ultrasound guidance a 6 Fr Safe-T-Centesis catheter was introduced. Thoracentesis was performed. The catheter was removed and a dressing applied. FINDINGS: A total of approximately 500 mL of clear, amber colored pleural fluid was removed. Samples were sent to the laboratory as requested by the clinical team. IMPRESSION: Successful ultrasound guided left thoracentesis yielding 500 mL of pleural fluid. Procedure performed by: Carlin Griffon, PA-C Electronically Signed   By: Juliene Balder M.D.   On: 08/09/2024 12:51   DG Chest 1 View Result Date: 08/09/2024 CLINICAL DATA:  Status post left thoracentesis EXAM: CHEST  1 VIEW COMPARISON:  Yesterday FINDINGS: Left  pleural effusion is smaller status post thoracentesis. No definite pneumothorax. IMPRESSION: Left pleural effusion is smaller status post thoracentesis. No definite pneumothorax. Electronically Signed   By: Lynwood Landy Raddle M.D.   On: 08/09/2024 12:06   ECHOCARDIOGRAM COMPLETE Result Date: 08/08/2024    ECHOCARDIOGRAM REPORT   Patient Name:   Tracy Buck Date of Exam: 08/08/2024 Medical Rec #:  [id number]    Height:       64.0 in Accession #:    [id number]   Weight:       116.6 lb Date of Birth:  01-Mar-1935    BSA:          1.556 m Patient Age:    89 years     BP:           134/60 mmHg Patient Gender: F            HR:           85 bpm. Exam Location:  Inpatient Procedure: 2D Echo, Cardiac Doppler and Color Doppler (Both Spectral and Color            Flow Doppler were utilized during procedure). Indications:    CHF I50.9  History:        Patient has prior history of Echocardiogram examinations, most                 recent 11/05/2020. Signs/Symptoms:Hypertensive Heart Disease.  Sonographer:    Nathanel Devonshire Referring Phys: [phone] STEVEN J NEWTON IMPRESSIONS  1. Left ventricular ejection fraction, by estimation, is 40 to 45%. The left ventricle has mildly decreased function. The left ventricle demonstrates regional wall motion abnormalities (see scoring diagram/findings for description). There is mild asymmetric left ventricular hypertrophy of the basal-septal segment. Left ventricular diastolic parameters are indeterminate.  2. Right ventricular systolic function is normal. The right ventricular size is normal. There is mildly elevated pulmonary artery systolic pressure. The estimated right ventricular systolic pressure is 38.3 mmHg.  3. Left atrial size was moderately dilated.  4. Right atrial size was severely dilated.  5. Moderate pleural effusion in the left lateral region.  6. The mitral valve is degenerative. Trivial mitral valve regurgitation. No evidence of mitral stenosis.  7. Tricuspid valve regurgitation is  moderate to severe.  8. S/p TAVR 23mm Evolut Pro VIV TAVR (previous Magna Ease). Gradients stable based on previous echo report. The aortic valve has been repaired/replaced. Aortic valve regurgitation is mild. No aortic stenosis is present. Echo findings are consistent with  normal structure and function of the aortic valve prosthesis. Aortic valve area, by VTI measures 1.49 cm. Aortic valve mean gradient measures 8.0 mmHg. Aortic valve Vmax measures 1.95 m/s.  9. The inferior vena cava is normal in size with greater than 50% respiratory variability, suggesting right atrial pressure of 3 mmHg. 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 Recommend repeating study with Definity   contrast. FINDINGS  Left Ventricle: Left ventricular ejection fraction, by estimation, is 40 to 45%. The left ventricle has mildly decreased function. The left ventricle demonstrates regional wall motion abnormalities. The left ventricular internal cavity size was normal in size. There is mild asymmetric left ventricular hypertrophy of the basal-septal segment. Left ventricular diastolic parameters are indeterminate.  LV Wall Scoring: The apical septal segment is dyskinetic. The mid inferoseptal segment, apical inferior segment, and apex are akinetic. The inferior wall and apical lateral segment are hypokinetic. Right Ventricle: The right ventricular size is normal. No increase in right ventricular wall thickness. Right ventricular systolic function is normal. There is mildly elevated pulmonary artery systolic pressure. The tricuspid regurgitant velocity is 2.97  m/s, and with an assumed right atrial pressure of 3 mmHg, the estimated right ventricular systolic pressure is 38.3 mmHg. Left Atrium: Left atrial size was moderately dilated. Right Atrium: Right atrial size was severely dilated. Pericardium: There is no evidence of pericardial effusion. Mitral Valve: The mitral valve is degenerative in appearance. Mild mitral  annular calcification. Trivial mitral valve regurgitation. No evidence of mitral valve stenosis. Tricuspid Valve: The tricuspid valve is normal in structure. Tricuspid valve regurgitation is moderate to severe. No evidence of tricuspid stenosis. Aortic Valve: S/p TAVR 23mm Evolut Pro VIV TAVR (previous Magna Ease). Gradients stable based on previous echo report. The aortic valve has been repaired/replaced. Aortic valve regurgitation is mild. No aortic stenosis is present. Aortic valve mean gradient measures 8.0 mmHg. Aortic valve peak gradient measures 15.2 mmHg. Aortic valve area, by VTI measures 1.49 cm. There is a 23 mm CoreValve-Evolut Pro prosthetic, stented (TAVR) valve present in the aortic position. Echo findings are consistent with normal structure and function of the aortic valve prosthesis. Pulmonic Valve: The pulmonic valve was normal in structure. Pulmonic valve regurgitation is not visualized. No evidence of pulmonic stenosis. Aorta: The aortic root is normal in size and structure. Venous: The inferior vena cava is normal in size with greater than 50% respiratory variability, suggesting right atrial pressure of 3 mmHg. IAS/Shunts: No atrial level shunt detected by color flow Doppler. Additional Comments: There is a moderate pleural effusion in the left lateral region.  LEFT VENTRICLE PLAX 2D LVIDd:         5.95 cm     Diastology LVIDs:         4.50 cm     LV e' medial:    4.79 cm/s LV PW:         0.95 cm     LV E/e' medial:  23.2 LV IVS:        0.95 cm     LV e' lateral:   7.51 cm/s LVOT diam:     1.80 cm     LV E/e' lateral: 14.8 LV SV:         45 LV SV Index:   29 LVOT Area:     2.54 cm  LV Volumes (MOD) LV vol d, MOD A2C: 66.8 ml LV vol d, MOD A4C: 85.3 ml LV vol s, MOD A2C: 41.7 ml LV vol s, MOD A4C: 51.5 ml LV SV MOD A2C:     25.1 ml LV SV MOD A4C:     85.3 ml LV SV MOD BP:      33.1 ml RIGHT VENTRICLE            IVC RV Basal diam:  4.10 cm    IVC diam: 1.80 cm RV S prime:     8.59  cm/s TAPSE  (M-mode): 1.8 cm LEFT ATRIUM             Index        RIGHT ATRIUM           Index LA diam:        3.40 cm 2.19 cm/m   RA Area:     20.10 cm LA Vol (A2C):   70.7 ml 45.45 ml/m  RA Volume:   65.70 ml  42.23 ml/m LA Vol (A4C):   50.3 ml 32.33 ml/m LA Biplane Vol: 62.6 ml 40.24 ml/m  AORTIC VALVE AV Area (Vmax):    1.47 cm AV Area (Vmean):   1.19 cm AV Area (VTI):     1.49 cm AV Vmax:           195.00 cm/s AV Vmean:          127.000 cm/s AV VTI:            0.302 m AV Peak Grad:      15.2 mmHg AV Mean Grad:      8.0 mmHg LVOT Vmax:         113.00 cm/s LVOT Vmean:        59.600 cm/s LVOT VTI:          0.177 m LVOT/AV VTI ratio: 0.59  AORTA Ao Root diam: 2.40 cm Ao Asc diam:  2.90 cm MITRAL VALVE                TRICUSPID VALVE MV Area (PHT): 4.63 cm     TR Peak grad:   35.3 mmHg MV Decel Time: 164 msec     TR Vmax:        297.00 cm/s MV E velocity: 111.00 cm/s MV A velocity: 23.60 cm/s   SHUNTS MV E/A ratio:  4.70         Systemic VTI:  0.18 m                             Systemic Diam: 1.80 cm Toribio Fuel MD Electronically signed by Toribio Fuel MD Signature Date/Time: 08/08/2024/7:51:47 PM    Final      Scheduled Meds:  diltiazem   120 mg Oral Daily   furosemide   40 mg Oral Daily   rosuvastatin   20 mg Oral Daily   spironolactone   12.5 mg Oral Daily   Continuous Infusions:   LOS: 2 days    Time spent:    Sigurd Pac, MD [facility]   08/10/2024, 9:33 AM    "

## 2024-08-10 NOTE — Progress Notes (Signed)
 Physical Therapy Treatment Patient Details Name: Tracy Buck MRN: [id number] DOB: 1935/01/13 Today's Date: 08/10/2024   History of Present Illness Pt is an 89 y/o female admitted for acute on chronic HFpEF. S/p thoracentesis yielding .5L PMH: chronic HFpEF, HTN, HLD, COPD, CAD s/p CABG, TAVR    PT Comments  Pt demonstrating good progress this session, improving distance ambulated with AD and no physical assistance. At baseline, pt reports no utilizing an AD but demonstrates improved stability when ambulating with RW; pt would benefit from utilizing at home. Pt continues to be limited secondary to fatigue, requesting to return to bed following short bout of ambulation. PT will continue to treat pt while she is admitted. Recommending HHPT at discharge to address remaining mobility deficits and optimize return to PLOF.    If plan is discharge home, recommend the following: A little help with walking and/or transfers;Assist for transportation;Help with stairs or ramp for entrance   Can travel by private vehicle        Equipment Recommendations  None recommended by PT    Recommendations for Other Services       Precautions / Restrictions Precautions Precautions: Fall Recall of Precautions/Restrictions: Intact Restrictions Weight Bearing Restrictions Per Provider Order: No     Mobility  Bed Mobility Overal bed mobility: Needs Assistance Bed Mobility: Rolling, Supine to Sit, Sit to Supine Rolling: Contact guard assist (to the L for bed pad placement)   Supine to sit: Supervision, HOB elevated Sit to supine: Mod assist   General bed mobility comments: Pt able to advance BLE off EOB and utilized R bed railing to pull into sitting. Pt requires physical assistance via bed pad for advancing hips to EOB. For sit to supine, pt requires physical assistance for BLE management. VC given for sequencing; increased time to complete.    Transfers Overall transfer level: Needs  assistance Equipment used: Rolling walker (2 wheels) Transfers: Sit to/from Stand Sit to Stand: Contact guard assist           General transfer comment: Pt completed STS from EOB with RW and no physical assistance. VC given to push from bed as pt attemtps to pull to stand with RW. Increased time to complete.    Ambulation/Gait Ambulation/Gait assistance: Contact guard assist Gait Distance (Feet): 20 Feet Assistive device: Rolling walker (2 wheels) Gait Pattern/deviations: Step-through pattern, Decreased step length - right, Decreased step length - left, Decreased stride length, Trunk flexed, Narrow base of support, Shuffle Gait velocity: reduced Gait velocity interpretation: <1.31 ft/sec, indicative of household ambulator   General Gait Details: At beginning of ambulation, pt demonstrating reduced step length with increased cadence, and reliance on RW for improved stabilty. With time, pt able to progress gait speed, but continues to display shuffle like gait, with reduced hip and knee flexion, and ankle DF bilaterally.   Stairs             Wheelchair Mobility     Tilt Bed    Modified Rankin (Stroke Patients Only)       Balance Overall balance assessment: Needs assistance Sitting-balance support: No upper extremity supported, Feet supported Sitting balance-Leahy Scale: Fair Sitting balance - Comments: seated EOB   Standing balance support: Bilateral upper extremity supported, During functional activity, Reliant on assistive device for balance Standing balance-Leahy Scale: Poor Standing balance comment: reliant on external support for improved stability with gait  Communication Communication Communication: No apparent difficulties  Cognition Arousal: Alert Behavior During Therapy: WFL for tasks assessed/performed   PT - Cognitive impairments: No family/caregiver present to determine baseline, Sequencing, Problem solving                          Following commands: Intact      Cueing Cueing Techniques: Verbal cues  Exercises      General Comments General comments (skin integrity, edema, etc.): VSS      Pertinent Vitals/Pain Pain Assessment Pain Assessment: No/denies pain    Home Living                          Prior Function            PT Goals (current goals can now be found in the care plan section) Acute Rehab PT Goals Patient Stated Goal: to go home PT Goal Formulation: With patient Time For Goal Achievement: 08/22/24 Potential to Achieve Goals: Fair Progress towards PT goals: Progressing toward goals    Frequency    Min 2X/week      PT Plan      Co-evaluation              AM-PAC PT 6 Clicks Mobility   Outcome Measure  Help needed turning from your back to your side while in a flat bed without using bedrails?: A Little Help needed moving from lying on your back to sitting on the side of a flat bed without using bedrails?: A Little Help needed moving to and from a bed to a chair (including a wheelchair)?: A Little Help needed standing up from a chair using your arms (e.g., wheelchair or bedside chair)?: A Little Help needed to walk in hospital room?: A Little Help needed climbing 3-5 steps with a railing? : Total 6 Click Score: 16    End of Session Equipment Utilized During Treatment: Gait belt;Oxygen (3L SpO2 100%) Activity Tolerance: Patient tolerated treatment well Patient left: in bed;with call bell/phone within reach;with bed alarm set Nurse Communication: Mobility status PT Visit Diagnosis: Unsteadiness on feet (R26.81);Other abnormalities of gait and mobility (R26.89);Muscle weakness (generalized) (M62.81)     Time: 8954-8894 PT Time Calculation (min) (ACUTE ONLY): 20 min  Charges:    $Therapeutic Activity: 8-22 mins PT General Charges $$ ACUTE PT VISIT: 1 Visit                     Leontine Hilt DPT Acute Rehab  Services [phone] Prefer contact via chat    Leontine NOVAK Haydn Hutsell 08/10/2024, 11:19 AM

## 2024-08-10 NOTE — Progress Notes (Signed)
 Occupational Therapy Treatment Patient Details Name: Tracy Buck MRN: [id number] DOB: 05-Sep-1934 Today's Date: 08/10/2024   History of present illness Pt is an 89 y/o female admitted for acute on chronic HFpEF. PMH: chronic HFpEF, HTN, HLD, COPD, CAD s/p CABG, TAVR   OT comments  Pt. Seen for skilled OT treatment session.  Pt. Able to complete bed level grooming task with set up.  Bed mobility with CGA-MOD A with cues for encouragement and sequencing.  Sit/stand with S and MINA for standing clothing management.  Side stepx3 with rw min a.  Agree with current d/c recommendations.  Cont. With acute OT POC.      [place] had been taken off upon my arrival.  Pt. At 80%, rebound to 92% once [place] placed on pt.  RN aware and states pt. Takes it off frequently and does not keep in on despite encouragement.        If plan is discharge home, recommend the following:  A little help with bathing/dressing/bathroom;Assistance with cooking/housework;Direct supervision/assist for medications management;Direct supervision/assist for financial management;Assist for transportation   Equipment Recommendations       Recommendations for Other Services      Precautions / Restrictions Precautions Precautions: Fall Recall of Precautions/Restrictions: Intact       Mobility Bed Mobility Overal bed mobility: Needs Assistance Bed Mobility: Rolling, Sidelying to Sit, Sit to Sidelying Rolling: Contact guard assist, Used rails Sidelying to sit: Contact guard assist, HOB elevated, Used rails     Sit to sidelying: Mod assist, HOB elevated, Used rails General bed mobility comments: increased time to complete, heavy cues for sequencing and to cont. to try vs. reaching and asking therapist asst. to pull her up/out towards eob.  mod a to bring bles into bed    Transfers                         Balance                                           ADL either performed or assessed with  clinical judgement   ADL Overall ADL's : Needs assistance/impaired     Grooming: Set up;Sitting;Wash/dry face;Bed level               Lower Body Dressing: Sitting/lateral leans;Set up Lower Body Dressing Details (indicate cue type and reason): able to figure four without physical assistance or LOB to adj. socks   Toilet Transfer Details (indicate cue type and reason): in standing able to side step x3 towards hob, toilet transfer simulated Toileting- Clothing Manipulation and Hygiene: Minimal assistance;Sit to/from stand Toileting - Clothing Manipulation Details (indicate cue type and reason): able to pull up disposable briefs in standing from knee level over buttocks     Functional mobility during ADLs: Minimal assistance;Rolling walker (2 wheels) General ADL Comments: pt. able to demonstrate good sitting balance with figure four method to manage LB ADLs.    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[employer], [employer] Communication: No apparent difficulties   Cognition Arousal: Alert Behavior During Therapy: WFL for tasks assessed/performed Cognition: Cognition impaired   Orientation impairments: Situation Awareness: Online awareness impaired Memory impairment (select all impairments): Short-term memory Attention impairment (select  first level of impairment): Selective attention                     Following commands: Intact        Cueing   Cueing Techniques: Verbal cues  Exercises      Shoulder Instructions       General Comments      Pertinent Vitals/ Pain       Pain Assessment Pain Assessment: No/denies pain  Home Living                                          Prior Functioning/Environment              Frequency  Min 2X/week        Progress Toward Goals  OT Goals(current goals can now be found in the care plan section)  Progress towards OT goals:  Progressing toward goals     Plan      Co-evaluation                 AM-PAC OT 6 Clicks Daily Activity     Outcome Measure   Help from another person eating meals?: None Help from another person taking care of personal grooming?: A Little Help from another person toileting, which includes using toliet, bedpan, or urinal?: A Little Help from another person bathing (including washing, rinsing, drying)?: A Little Help from another person to put on and taking off regular upper body clothing?: A Little Help from another person to put on and taking off regular lower body clothing?: A Little 6 Click Score: 19    End of Session Equipment Utilized During Treatment: Oxygen  OT Visit Diagnosis: Unsteadiness on feet (R26.81)   Activity Tolerance Patient tolerated treatment well   Patient Left in bed;with call bell/phone within reach;with bed alarm set   Nurse Communication Other (comment) (rn states ok to work with pt., updated rn on o2 levels/status after session)        Time: 9182-9169 OT Time Calculation (min): 13 min  Charges: OT General Charges $OT Visit: 1 Visit OT Treatments $Self Care/Home Management : 8-22 mins  Randall, COTA/L Acute Rehabilitation [phone]   CHRISTELLA Nest Lorraine-COTA/L  08/10/2024, 8:43 AM

## 2024-08-11 DIAGNOSIS — I5031 Acute diastolic (congestive) heart failure: Secondary | ICD-10-CM | POA: Diagnosis not present

## 2024-08-11 LAB — CBC
HCT: 31.6 % — ABNORMAL LOW (ref 36.0–46.0)
Hemoglobin: 9.7 g/dL — ABNORMAL LOW (ref 12.0–15.0)
MCH: 20.9 pg — ABNORMAL LOW (ref 26.0–34.0)
MCHC: 30.7 g/dL (ref 30.0–36.0)
MCV: 68.1 fL — ABNORMAL LOW (ref 80.0–100.0)
Platelets: 231 K/uL (ref 150–400)
RBC: 4.64 MIL/uL (ref 3.87–5.11)
RDW: 18.6 % — ABNORMAL HIGH (ref 11.5–15.5)
WBC: 11 K/uL — ABNORMAL HIGH (ref 4.0–10.5)
nRBC: 0.4 % — ABNORMAL HIGH (ref 0.0–0.2)

## 2024-08-11 NOTE — Progress Notes (Signed)
 " PROGRESS NOTE    Tracy Buck  FMW:[id number] DOB: [birth year] DOA: 08/07/2024 PCP: Ilda Cadet, PA-C  89/F with history of diastolic CHF, COPD, CABG, TAVR, dementia was brought to the ED by family with increased work of breathing, lower extremity edema for few weeks.  Family reports poor compliance with diet, excessive salt intake around Thanksgiving, worsening orthopnea, in the ED she was tachypneic, mildly tachycardic, hypoxic labs noted WBC of 7.1, hemoglobin 9, creatinine 1.2, proBNP 6114, chest x-ray with pleural effusions. - Admitted, started on diuretics - 12/18, underwent thoracentesis, palliative consulted - Palliative consult, plan for hospice services at ALF - Due to long process for ALF, interim plan for SNF with palliative care  Subjective: - Feels better overall, just finished eating some of her breakfast, breathing overall better  Assessment and Plan:  Acute on chronic heart failure with preserved ejection fraction (HFpEF) (HCC) Moderate left pleural effusion -Last echo 3/22 noted EF 60-65%, normal RV, mildly elevated PASP - Repeat x-ray with moderate left effusion and small right effusion, increased bilateral interstitial and patchy opacities -Clinical suspicion for pneumonia is low, hold off on antibiotics - Now switched to oral Lasix , underwent thoracentesis 12/18  -Started on low-dose Aldactone , caution with excess GDMT  Dementia Delirium - Worsening confusion in the last few weeks months per family - Palliative consult for goals of care, discussed with daughters, family wanting ALF with hospice, given long process now plan for SNF with palliative care  Hypokalemia Repleted, mag is acceptable  Persistent atrial fibrillation (HCC) Rate controlled at present Continue diltiazem , Eliquis   COPD (chronic obstructive pulmonary disease) (HCC) Continue as needed Xopenex   S/P CABG (coronary artery bypass graft) -Stable, has no symptoms at this time, continue asa and  statin  S/P AVR (aortic valve replacement) History of bioprosthetic aortic valve prosthetic valve stenosis, now s/p TAVR  had a 21mm Magna valve implanted in 2007, which degenerated and became stenotic.   On 05/01/2020, she underwent TAVR  Followed by [facility]  On Eliquis    Hyperlipidemia Cont home crestor   DVT prophylaxis: Eliquis - Code Status: DNR Family Communication: no family at bedside today, discussed with daughters earlier Disposition Plan: Awaiting SNF  Consultants:    Procedures:   Antimicrobials:    Objective: Vitals:   08/11/24 0326 08/11/24 0414 08/11/24 0825 08/11/24 1018  BP: (!) 153/51  (!) 140/51 (!) 140/51  Pulse:   86   Resp: (!) 26 (!) 28 (!) 28   Temp: 97.8 F (36.6 C)  97.7 F (36.5 C)   TempSrc: Oral  Oral   SpO2: 95%  96%   Weight:  50.1 kg    Height:        Intake/Output Summary (Last 24 hours) at 08/11/2024 1037 Last data filed at 08/11/2024 0700 Gross per 24 hour  Intake 720 ml  Output 400 ml  Net 320 ml   Filed Weights   08/10/24 0300 08/11/24 0228 08/11/24 0414  Weight: 51.9 kg 50.1 kg 50.1 kg    Examination:  General exam: Early chronically ill, AAO x 2, moderate cognitive deficits, no distress HEENT: No JVD CVS: S1-S2, regular rhythm Lungs: Decreased breath sounds at the bases Abdomen: Soft, nontender, bowel sounds present  Extremities: No edema Skin: No rashes Psychiatry:mild confusion    Data Reviewed:   CBC: Recent Labs  Lab 08/06/24 2136 08/08/24 0245 08/09/24 0255 08/11/24 0247  WBC 7.1 8.1 8.9 11.0*  NEUTROABS  --  6.4  --   --   HGB  9.6* 9.8* 9.5* 9.7*  HCT 32.0* 33.4* 31.7* 31.6*  MCV 70.2* 71.4* 70.9* 68.1*  PLT 278 265 250 231   Basic Metabolic Panel: Recent Labs  Lab 08/06/24 2136 08/07/24 1613 08/08/24 0245 08/09/24 0255 08/10/24 0329  NA 136 138 139 136 138  K 2.8* 2.8* 3.9 4.6 4.5  CL 95* 99 102 99 99  CO2 28 29 30 26 27   GLUCOSE 122* 135* 160* 133* 115*  BUN  13 11 12 15 20   CREATININE 1.25* 1.21* 1.23* 1.57* 1.63*  CALCIUM  9.1 9.1 9.0 9.2 9.2  MG  --  1.9 2.0  --   --    GFR: Estimated Creatinine Clearance: 19.2 mL/min (A) (by C-G formula based on SCr of 1.63 mg/dL (H)). Liver Function Tests: Recent Labs  Lab 08/07/24 1613  AST 26  ALT 10  ALKPHOS 78  BILITOT 1.0  PROT 6.1*  ALBUMIN 3.7   No results for input(s): LIPASE, AMYLASE in the last 168 hours. No results for input(s): AMMONIA in the last 168 hours. Coagulation Profile: No results for input(s): INR, PROTIME in the last 168 hours. Cardiac Enzymes: No results for input(s): CKTOTAL, CKMB, CKMBINDEX, TROPONINI in the last 168 hours. BNP (last 3 results) Recent Labs    08/06/24 2136 08/08/24 0245  PROBNP 6,114.0* 5,865.0*   HbA1C: No results for input(s): HGBA1C in the last 72 hours. CBG: No results for input(s): GLUCAP in the last 168 hours. Lipid Profile: No results for input(s): CHOL, HDL, LDLCALC, TRIG, CHOLHDL, LDLDIRECT in the last 72 hours. Thyroid Function Tests: No results for input(s): TSH, T4TOTAL, FREET4, T3FREE, THYROIDAB in the last 72 hours. Anemia Panel: No results for input(s): VITAMINB12, FOLATE, FERRITIN, TIBC, IRON, RETICCTPCT in the last 72 hours. Urine analysis:    Component Value Date/Time   COLORURINE YELLOW 08/07/2024 0128   APPEARANCEUR CLEAR 08/07/2024 0128   LABSPEC 1.015 08/07/2024 0128   PHURINE 6.5 08/07/2024 0128   GLUCOSEU NEGATIVE 08/07/2024 0128   HGBUR MODERATE (A) 08/07/2024 0128   BILIRUBINUR NEGATIVE 08/07/2024 0128   KETONESUR NEGATIVE 08/07/2024 0128   PROTEINUR NEGATIVE 08/07/2024 0128   NITRITE NEGATIVE 08/07/2024 0128   LEUKOCYTESUR TRACE (A) 08/07/2024 0128   Sepsis Labs: @LABRCNTIP (procalcitonin:4,lacticidven:4)  ) Recent Results (from the past 240 hours)  Body fluid culture w Gram Stain     Status: None (Preliminary result)   Collection Time: 08/09/24  11:44 AM   Specimen: Lung, Left Lower Lobe; Pleural Fluid  Result Value Ref Range Status   Specimen Description PLEURAL  Final   Special Requests LEFT LOWER LOBE  Final   Gram Stain   Final    RARE WBC PRESENT, PREDOMINANTLY MONONUCLEAR NO ORGANISMS SEEN    Culture   Final    NO GROWTH < 24 HOURS Performed at [facility], [address] [place], [place] [address]    Report Status PENDING  Incomplete     Radiology Studies: IR THORACENTESIS ASP PLEURAL SPACE W/IMG GUIDE Result Date: 08/09/2024 INDICATION: 89 year old female enters admitted with acute on chronic heart failure, with moderate left-sided pleural effusion. IR is requested for diagnostic and therapeutic left-sided thoracentesis. EXAM: ULTRASOUND GUIDED DIAGNOSTIC AND THERAPEUTIC LEFT-SIDED THORACENTESIS MEDICATIONS: 4 mL of 1% lidocaine . COMPLICATIONS: None immediate. PROCEDURE: An ultrasound guided thoracentesis was thoroughly discussed with the patient and questions answered. The benefits, risks, alternatives and complications were also discussed. The patient understands and wishes to proceed with the procedure. Written consent was obtained. Ultrasound was performed to localize and mark  an adequate pocket of fluid in the left chest. The area was then prepped and draped in the normal sterile fashion. 1% Lidocaine  was used for local anesthesia. Under ultrasound guidance a 6 Fr Safe-T-Centesis catheter was introduced. Thoracentesis was performed. The catheter was removed and a dressing applied. FINDINGS: A total of approximately 500 mL of clear, amber colored pleural fluid was removed. Samples were sent to the laboratory as requested by the clinical team. IMPRESSION: Successful ultrasound guided left thoracentesis yielding 500 mL of pleural fluid. Procedure performed by: Carlin Griffon, PA-C Electronically Signed   By: Juliene Balder M.D.   On: 08/09/2024 12:51   DG Chest 1 View Result Date: 08/09/2024 CLINICAL DATA:  Status post  left thoracentesis EXAM: CHEST  1 VIEW COMPARISON:  Yesterday FINDINGS: Left pleural effusion is smaller status post thoracentesis. No definite pneumothorax. IMPRESSION: Left pleural effusion is smaller status post thoracentesis. No definite pneumothorax. Electronically Signed   By: Lynwood Landy Raddle M.D.   On: 08/09/2024 12:06     Scheduled Meds:  apixaban   2.5 mg Oral BID   diltiazem   120 mg Oral Daily   furosemide   40 mg Oral Daily   rosuvastatin   20 mg Oral Daily   spironolactone   12.5 mg Oral Daily   Continuous Infusions:   LOS: 3 days    Time spent:    Sigurd Pac, MD [facility]   08/11/2024, 10:37 AM    "

## 2024-08-11 NOTE — Progress Notes (Signed)
 Mobility Specialist Progress Note:    08/11/24 1113  Mobility  Activity Ambulated with assistance  Level of Assistance Contact guard assist, steadying assist  Assistive Device Other (Comment) (HHA)  Distance Ambulated (ft) 10 ft  Range of Motion/Exercises Active  Activity Response Tolerated fair  Mobility Referral Yes  Mobility visit 1 Mobility  Mobility Specialist Start Time (ACUTE ONLY) 1113  Mobility Specialist Stop Time (ACUTE ONLY) 1129  Mobility Specialist Time Calculation (min) (ACUTE ONLY) 16 min   Received pt laying in bed agreeable to session. Originally, pt had no c/o stating that she ambulated a couple times earlier and was willing to again. Once standing, she stated she started to feel more fatigued and like her feet did not want to pick up. Pt able to move and ambulate decently w/ HHA-CGA assist. Returned pt to bed w/ all needs met.   [name] Mobility Specialist Please Neurosurgeon or Rehab Office at [phone]

## 2024-08-11 NOTE — TOC Progression Note (Addendum)
 Transition of Care [facility]) - Progression Note    Patient Details  Name: Tracy Buck MRN: [id number] Date of Birth: 03-13-1935  [facility]) CM/SW Contact  Olam FORBES Ally, LCSW Phone Number: 08/11/2024, 12:22 PM  Clinical Narrative:     CSW reached out to patient's daughter Tracy Buck to inform her that [facility] was full therefore another SNF choice would need to be chosen. Tracy Buck explained that she would reach out to her sister and get back to CSW.  2:51 pm- CSW spoke with patient's daughter Tracy Buck and she confirmed new choice of [facility] and next [facility]. CSW will follow up [facility] on bed availability.  CSW called Soy at [facility] and had to leave a voice mail.    TOC team will continue to assist with discharge planning needs.       Expected Discharge Plan and Services                Social Drivers of Health (SDOH) Interventions SDOH Screenings   Food Insecurity: No Food Insecurity (08/07/2024)  Housing: Low Risk (08/07/2024)  Transportation Needs: No Transportation Needs (08/07/2024)  Utilities: Not At Risk (08/07/2024)  Social Connections: Moderately Integrated (08/07/2024)  Tobacco Use: Low Risk (08/06/2024)    Readmission Risk Interventions     No data to display

## 2024-08-12 LAB — BODY FLUID CULTURE W GRAM STAIN: Culture: NO GROWTH

## 2024-08-12 LAB — BASIC METABOLIC PANEL WITH GFR
Anion gap: 10 (ref 5–15)
BUN: 24 mg/dL — ABNORMAL HIGH (ref 8–23)
CO2: 27 mmol/L (ref 22–32)
Calcium: 8.5 mg/dL — ABNORMAL LOW (ref 8.9–10.3)
Chloride: 98 mmol/L (ref 98–111)
Creatinine, Ser: 1.36 mg/dL — ABNORMAL HIGH (ref 0.44–1.00)
GFR, Estimated: 38 mL/min — ABNORMAL LOW
Glucose, Bld: 130 mg/dL — ABNORMAL HIGH (ref 70–99)
Potassium: 3.2 mmol/L — ABNORMAL LOW (ref 3.5–5.1)
Sodium: 135 mmol/L (ref 135–145)

## 2024-08-12 LAB — ACID FAST SMEAR (AFB, MYCOBACTERIA): Acid Fast Smear: NEGATIVE

## 2024-08-12 MED ORDER — POTASSIUM CHLORIDE CRYS ER 20 MEQ PO TBCR
40.0000 meq | EXTENDED_RELEASE_TABLET | Freq: Once | ORAL | Status: AC
  Administered 2024-08-12: 40 meq via ORAL
  Filled 2024-08-12: qty 2

## 2024-08-12 NOTE — Progress Notes (Signed)
 Mobility Specialist Progress Note:    08/12/24 1157  Mobility  Activity Ambulated with assistance  Level of Assistance Contact guard assist, steadying assist  Assistive Device None;Other (Comment) (HHA)  Distance Ambulated (ft) 10 ft  Range of Motion/Exercises Active  Activity Response Tolerated well  Mobility Referral Yes  Mobility visit 1 Mobility  Mobility Specialist Start Time (ACUTE ONLY) 1157  Mobility Specialist Stop Time (ACUTE ONLY) 1220  Mobility Specialist Time Calculation (min) (ACUTE ONLY) 23 min   Received pt laying in bed agreeable to session. No c/o any symptoms. Pt feeling better today; moving and ambulating well. Pt performed 3 STS before being left in chair w/ all needs met. RN notified.   [name] Mobility Specialist Please Neurosurgeon or Rehab Office at [phone]

## 2024-08-12 NOTE — Progress Notes (Signed)
" °  Patient seen and examined, no changes from my note yesterday, remains medically stable for discharge to SNF with outpatient palliative care when bed available   Sigurd Pac, MD  "

## 2024-08-12 NOTE — Plan of Care (Signed)
   Problem: Education: Goal: Knowledge of General Education information will improve Description Including pain rating scale, medication(s)/side effects and non-pharmacologic comfort measures Outcome: Progressing

## 2024-08-13 DIAGNOSIS — I5031 Acute diastolic (congestive) heart failure: Secondary | ICD-10-CM | POA: Diagnosis not present

## 2024-08-13 LAB — BASIC METABOLIC PANEL WITH GFR
Anion gap: 11 (ref 5–15)
BUN: 19 mg/dL (ref 8–23)
CO2: 25 mmol/L (ref 22–32)
Calcium: 8.7 mg/dL — ABNORMAL LOW (ref 8.9–10.3)
Chloride: 99 mmol/L (ref 98–111)
Creatinine, Ser: 1.21 mg/dL — ABNORMAL HIGH (ref 0.44–1.00)
GFR, Estimated: 43 mL/min — ABNORMAL LOW
Glucose, Bld: 123 mg/dL — ABNORMAL HIGH (ref 70–99)
Potassium: 3.3 mmol/L — ABNORMAL LOW (ref 3.5–5.1)
Sodium: 135 mmol/L (ref 135–145)

## 2024-08-13 MED ORDER — LOSARTAN POTASSIUM 25 MG PO TABS: 25.0000 mg | ORAL_TABLET | Freq: Every day | ORAL | Status: AC

## 2024-08-13 MED ORDER — SPIRONOLACTONE 25 MG PO TABS: 25.0000 mg | ORAL_TABLET | Freq: Every day | ORAL | Status: AC

## 2024-08-13 MED ORDER — SPIRONOLACTONE 25 MG PO TABS
25.0000 mg | ORAL_TABLET | Freq: Every day | ORAL | Status: DC
  Administered 2024-08-13: 25 mg via ORAL
  Filled 2024-08-13: qty 1

## 2024-08-13 MED ORDER — POTASSIUM CHLORIDE CRYS ER 20 MEQ PO TBCR
40.0000 meq | EXTENDED_RELEASE_TABLET | Freq: Once | ORAL | Status: AC
  Administered 2024-08-13: 40 meq via ORAL
  Filled 2024-08-13: qty 2

## 2024-08-13 MED ORDER — LOSARTAN POTASSIUM 25 MG PO TABS
25.0000 mg | ORAL_TABLET | Freq: Every day | ORAL | Status: DC
  Administered 2024-08-13: 25 mg via ORAL
  Filled 2024-08-13: qty 1

## 2024-08-13 MED ORDER — APIXABAN 2.5 MG PO TABS: 2.5000 mg | ORAL_TABLET | Freq: Two times a day (BID) | ORAL | Status: AC

## 2024-08-13 MED ORDER — POTASSIUM CHLORIDE CRYS ER 20 MEQ PO TBCR: 20.0000 meq | EXTENDED_RELEASE_TABLET | Freq: Every day | ORAL | Status: AC

## 2024-08-13 NOTE — TOC Progression Note (Signed)
 Transition of Care [facility]) - Progression Note    Patient Details  Name: Tracy Buck MRN: [id number] Date of Birth: 01/03/1935  [facility] CM/SW Contact  Luise JAYSON Pan, [place] Phone Number: 08/13/2024, 12:07 PM  Clinical Narrative:   [facility] can accept patient per Brittany with [employer] facilities. CSW notified family and family would like to move forward with [facility]. [name] informed CSW that patient has been approved to  go to [facility] on [address] in [place] after STR. CSW awaiting call to report number from facility.  CSW will continue to follow.                        Expected Discharge Plan and Services         Expected Discharge Date: 08/13/24                                     Social Drivers of Health (SDOH) Interventions SDOH Screenings   Food Insecurity: No Food Insecurity (08/07/2024)  Housing: Low Risk (08/07/2024)  Transportation Needs: No Transportation Needs (08/07/2024)  Utilities: Not At Risk (08/07/2024)  Social Connections: Moderately Integrated (08/07/2024)  Tobacco Use: Low Risk (08/06/2024)    Readmission Risk Interventions     No data to display

## 2024-08-13 NOTE — Care Management Important Message (Signed)
 Important Message  Patient Details  Name: Sumiko Ceasar MRN: [id number] Date of Birth: 1935/02/01   Important Message Given:  Yes - Medicare IM     Vonzell Arrie Sharps 08/13/2024, 12:07 PM

## 2024-08-13 NOTE — Plan of Care (Signed)
   Problem: Education: Goal: Knowledge of General Education information will improve Description Including pain rating scale, medication(s)/side effects and non-pharmacologic comfort measures Outcome: Progressing   Problem: Health Behavior/Discharge Planning: Goal: Ability to manage health-related needs will improve Outcome: Progressing

## 2024-08-13 NOTE — Progress Notes (Signed)
 Patient report given to nurse Brittney at [facility]. Verdel LOISE Shams, RN

## 2024-08-13 NOTE — TOC Transition Note (Addendum)
 Transition of Care [facility]) - Discharge Note   Patient Details  Name: Tracy Buck MRN: [id number] Date of Birth: [birth year]  Transition of Care [facility]) CM/SW Contact:  Tracy Buck, LCSWA Phone Number: 08/13/2024, 12:26 PM   Clinical Narrative:   Patient will DC to: [facility] SNF Anticipated DC date: 08/13/2024  Family notified: Tracy Buck (dtr) [phone]  Transport by: [facility]   Per MD patient ready for DC to [facility]. RN to call report prior to discharge ([phone]; room 305). RN, patient, patient's family, and facility notified of DC. Discharge Summary and FL2 sent to facility. DC packet on chart. Ambulance transport requested for patient 12:24PM.   CSW will sign off for now as social work intervention is no longer needed. Please consult us  again if new needs arise.      Final next level of care: Skilled Nursing Facility Barriers to Discharge: Barriers Resolved   Patient Goals and CMS Choice   CMS Medicare.gov Compare Post Acute Care list provided to:: Patient Represenative (must comment) Choice offered to / list presented to : Adult Children [place] ownership interest in [facility].provided to:: Adult Children    Discharge Placement              Patient chooses bed at: Other - please specify in the comment section below: [facility] SNF) Patient to be transferred to facility by: [facility] Name of family member notified: Tracy Buck (dtr) [phone] Patient and family notified of of transfer: 08/13/24  Discharge Plan and Services Additional resources added to the After Visit Summary for                                       Social Drivers of Health (SDOH) Interventions SDOH Screenings   Food Insecurity: No Food Insecurity (08/07/2024)  Housing: Low Risk (08/07/2024)  Transportation Needs: No Transportation Needs (08/07/2024)  Utilities: Not At Risk (08/07/2024)  Social Connections: Moderately Integrated (08/07/2024)   Tobacco Use: Low Risk (08/06/2024)     Readmission Risk Interventions     No data to display

## 2024-08-13 NOTE — Discharge Summary (Addendum)
 Physician Discharge Summary  Tracy Buck FMW:[id number] DOB: 09/05/1934 DOA: 08/07/2024  PCP: Ilda Cadet, PA-C  Admit date: 08/07/2024 Discharge date: 08/13/2024  Time spent:45 minutes  Recommendations for Outpatient Follow-up:  Plan for SNF with palliative care and then transition to ALF with hospice after arrangements completed   Discharge Diagnoses:  Principal Problem:   Acute on chronic heart failure with preserved ejection fraction (HFpEF)  Dementia Delirium Persistent atrial fibrillation   Hyperlipidemia   S/P AVR (aortic valve replacement)   S/P CABG (coronary artery bypass graft)   COPD (chronic obstructive pulmonary disease) (HCC)   Persistent atrial fibrillation (HCC)   Hypokalemia DNR   Discharge Condition: Improved  Diet recommendation: Low-sodium, heart healthy  Filed Weights   08/11/24 0414 08/12/24 0500 08/13/24 0500  Weight: 50.1 kg 50 kg 49.8 kg    History of present illness:  89/F with history of diastolic CHF, COPD, CABG, TAVR, dementia was brought to the ED by family with increased work of breathing, lower extremity edema for few weeks.  Family reports poor compliance with diet, excessive salt intake around Thanksgiving, worsening orthopnea, in the ED she was tachypneic, mildly tachycardic, hypoxic labs noted WBC of 7.1, hemoglobin 9, creatinine 1.2, proBNP 6114, chest x-ray with pleural effusions. - Admitted, started on diuretics - 12/18, underwent thoracentesis, palliative consulted - Palliative consult, plan for hospice services at ALF - Due to long process for ALF, interim plan for SNF with palliative care    Hospital Course:   Acute on chronic heart failure with preserved ejection fraction (HFpEF) (HCC) Moderate left pleural effusion -Last echo 3/22 noted EF 60-65%, normal RV, mildly elevated PASP - Repeat x-ray with moderate left effusion and small right effusion, increased bilateral interstitial and patchy opacities - Improved with  diuresis and thoracentesis on 12/18, fluid was transudative, cultures negative - Now switched to oral Lasix , started on low-dose Aldactone  and losartan  - Seen by palliative care on account of dementia delirium and failure to thrive, plan for hospice eventually when she transition to assisted living from SNF   Dementia Delirium - Worsening confusion in the last few weeks months per family - Palliative consult for goals of care, discussed with daughters, family wanting ALF with hospice, given long process now plan for SNF with palliative care   Hypokalemia Repleted, mag is acceptable   Persistent atrial fibrillation (HCC) Rate controlled at present Continue diltiazem , Eliquis    COPD (chronic obstructive pulmonary disease) (HCC) Continue as needed Xopenex    S/P CABG (coronary artery bypass graft) -Stable, has no symptoms at this time, continue asa and statin   S/P AVR (aortic valve replacement) History of bioprosthetic aortic valve prosthetic valve stenosis, now s/p TAVR  had a 21mm Magna valve implanted in 2007, which degenerated and became stenotic.   On 05/01/2020, she underwent TAVR  Followed by [facility]     Hyperlipidemia Cont home crestor   Discharge Exam: Vitals:   08/13/24 0433 08/13/24 0724  BP: (!) 129/48 (!) 175/75  Pulse: 77 87  Resp: 18 18  Temp: 98.2 F (36.8 C) 98.1 F (36.7 C)  SpO2: 96% 96%   General exam: Early chronically ill, AAO x 2, moderate cognitive deficits, no distress HEENT: No JVD CVS: S1-S2, regular rhythm Lungs: Decreased breath sounds at the bases Abdomen: Soft, nontender, bowel sounds present  Extremities: No edema Skin: No rashes Psychiatry:mild confusion   Discharge Instructions    Allergies as of 08/13/2024       Reactions   Fig Extract [  ficus] Anaphylaxis   Citalopram Hydrobromide Other (See Comments)   Pt unsure if this is accurate   Sulfa Antibiotics Other (See Comments)   Severe nervousness.    Prednisone Other (See Comments)   Other reaction(s): Other (See Comments) So nervous I could climb the walls So nervous I could climb the walls        Medication List     STOP taking these medications    fosfomycin  3 g Pack Commonly known as: MONUROL        TAKE these medications    apixaban  2.5 MG Tabs tablet Commonly known as: ELIQUIS  Take 1 tablet (2.5 mg total) by mouth 2 (two) times daily. What changed:  medication strength how much to take   aspirin  EC 325 MG tablet Take 325 mg by mouth daily.   diltiazem  120 MG 24 hr capsule Commonly known as: CARDIZEM  CD Take 1 capsule (120 mg total) by mouth daily.   furosemide  40 MG tablet Commonly known as: LASIX  Take 1 tablet (40 mg total) by mouth daily.   losartan  25 MG tablet Commonly known as: COZAAR  Take 1 tablet (25 mg total) by mouth daily. Start taking on: August 14, 2024   potassium chloride  SA 20 MEQ tablet Commonly known as: KLOR-CON  M Take 1 tablet (20 mEq total) by mouth daily. What changed: when to take this   rosuvastatin  20 MG tablet Commonly known as: CRESTOR  Take 20 mg by mouth daily.   spironolactone  25 MG tablet Commonly known as: ALDACTONE  Take 1 tablet (25 mg total) by mouth daily. Start taking on: August 14, 2024       Allergies[1]  Contact information for after-discharge care     Destination     [facility] .   Service: Skilled Nursing Contact information: [address] [employer] [place]  [address] [phone]             Home Medical Care     [facility]) .   Service: Home Health Services Why: Agency will call you to set up apt times Contact information: [address] [place]  [phone]                      The results of significant diagnostics from this hospitalization (including imaging, microbiology, ancillary and laboratory) are listed below for  reference.    Significant Diagnostic Studies: IR THORACENTESIS ASP PLEURAL SPACE W/IMG GUIDE Result Date: 08/09/2024 INDICATION: 89 year old female enters admitted with acute on chronic heart failure, with moderate left-sided pleural effusion. IR is requested for diagnostic and therapeutic left-sided thoracentesis. EXAM: ULTRASOUND GUIDED DIAGNOSTIC AND THERAPEUTIC LEFT-SIDED THORACENTESIS MEDICATIONS: 4 mL of 1% lidocaine . COMPLICATIONS: None immediate. PROCEDURE: An ultrasound guided thoracentesis was thoroughly discussed with the patient and questions answered. The benefits, risks, alternatives and complications were also discussed. The patient understands and wishes to proceed with the procedure. Written consent was obtained. Ultrasound was performed to localize and mark an adequate pocket of fluid in the left chest. The area was then prepped and draped in the normal sterile fashion. 1% Lidocaine  was used for local anesthesia. Under ultrasound guidance a 6 Fr Safe-T-Centesis catheter was introduced. Thoracentesis was performed. The catheter was removed and a dressing applied. FINDINGS: A total of approximately 500 mL of clear, amber colored pleural fluid was removed. Samples were sent to the laboratory as requested by the clinical team. IMPRESSION: Successful ultrasound guided left thoracentesis yielding 500 mL of  pleural fluid. Procedure performed by: Carlin Griffon, PA-C Electronically Signed   By: Juliene Balder M.D.   On: 08/09/2024 12:51   DG Chest 1 View Result Date: 08/09/2024 CLINICAL DATA:  Status post left thoracentesis EXAM: CHEST  1 VIEW COMPARISON:  Yesterday FINDINGS: Left pleural effusion is smaller status post thoracentesis. No definite pneumothorax. IMPRESSION: Left pleural effusion is smaller status post thoracentesis. No definite pneumothorax. Electronically Signed   By: Lynwood Landy Raddle M.D.   On: 08/09/2024 12:06   ECHOCARDIOGRAM COMPLETE Result Date: 08/08/2024    ECHOCARDIOGRAM  REPORT   Patient Name:   Tracy Buck Date of Exam: 08/08/2024 Medical Rec #:  [id number]    Height:       64.0 in Accession #:    [id number]   Weight:       116.6 lb Date of Birth:  06-06-1935    BSA:          1.556 m Patient Age:    89 years     BP:           134/60 mmHg Patient Gender: F            HR:           85 bpm. Exam Location:  Inpatient Procedure: 2D Echo, Cardiac Doppler and Color Doppler (Both Spectral and Color            Flow Doppler were utilized during procedure). Indications:    CHF I50.9  History:        Patient has prior history of Echocardiogram examinations, most                 recent 11/05/2020. Signs/Symptoms:Hypertensive Heart Disease.  Sonographer:    Nathanel Devonshire Referring Phys: [phone] STEVEN J NEWTON IMPRESSIONS  1. Left ventricular ejection fraction, by estimation, is 40 to 45%. The left ventricle has mildly decreased function. The left ventricle demonstrates regional wall motion abnormalities (see scoring diagram/findings for description). There is mild asymmetric left ventricular hypertrophy of the basal-septal segment. Left ventricular diastolic parameters are indeterminate.  2. Right ventricular systolic function is normal. The right ventricular size is normal. There is mildly elevated pulmonary artery systolic pressure. The estimated right ventricular systolic pressure is 38.3 mmHg.  3. Left atrial size was moderately dilated.  4. Right atrial size was severely dilated.  5. Moderate pleural effusion in the left lateral region.  6. The mitral valve is degenerative. Trivial mitral valve regurgitation. No evidence of mitral stenosis.  7. Tricuspid valve regurgitation is moderate to severe.  8. S/p TAVR 23mm Evolut Pro VIV TAVR (previous Magna Ease). Gradients stable based on previous echo report. The aortic valve has been repaired/replaced. Aortic valve regurgitation is mild. No aortic stenosis is present. Echo findings are consistent with  normal structure and function of the aortic valve  prosthesis. Aortic valve area, by VTI measures 1.49 cm. Aortic valve mean gradient measures 8.0 mmHg. Aortic valve Vmax measures 1.95 m/s.  9. The inferior vena cava is normal in size with greater than 50% respiratory variability, suggesting right atrial pressure of 3 mmHg. 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 Recommend repeating study with Definity  contrast. FINDINGS  Left Ventricle: Left ventricular ejection fraction, by estimation, is 40 to 45%. The left ventricle has mildly decreased function. The left ventricle demonstrates regional wall motion abnormalities. The left ventricular internal cavity size was normal in size. There is mild asymmetric left ventricular hypertrophy of the basal-septal segment. Left ventricular diastolic parameters are indeterminate.  LV Wall Scoring: The apical septal segment is dyskinetic. The mid inferoseptal segment, apical inferior segment, and apex are akinetic. The inferior wall and apical lateral segment are hypokinetic. Right Ventricle: The right ventricular size is normal. No increase in right ventricular wall thickness. Right ventricular systolic function is normal. There is mildly elevated pulmonary artery systolic pressure. The tricuspid regurgitant velocity is 2.97  m/s, and with an assumed right atrial pressure of 3 mmHg, the estimated right ventricular systolic pressure is 38.3 mmHg. Left Atrium: Left atrial size was moderately dilated. Right Atrium: Right atrial size was severely dilated. Pericardium: There is no evidence of pericardial effusion. Mitral Valve: The mitral valve is degenerative in appearance. Mild mitral annular calcification. Trivial mitral valve regurgitation. No evidence of mitral valve stenosis. Tricuspid Valve: The tricuspid valve is normal in structure. Tricuspid valve regurgitation is moderate to severe. No evidence of tricuspid stenosis. Aortic Valve: S/p TAVR 23mm Evolut Pro VIV TAVR (previous Magna Ease). Gradients  stable based on previous echo report. The aortic valve has been repaired/replaced. Aortic valve regurgitation is mild. No aortic stenosis is present. Aortic valve mean gradient measures 8.0 mmHg. Aortic valve peak gradient measures 15.2 mmHg. Aortic valve area, by VTI measures 1.49 cm. There is a 23 mm CoreValve-Evolut Pro prosthetic, stented (TAVR) valve present in the aortic position. Echo findings are consistent with normal structure and function of the aortic valve prosthesis. Pulmonic Valve: The pulmonic valve was normal in structure. Pulmonic valve regurgitation is not visualized. No evidence of pulmonic stenosis. Aorta: The aortic root is normal in size and structure. Venous: The inferior vena cava is normal in size with greater than 50% respiratory variability, suggesting right atrial pressure of 3 mmHg. IAS/Shunts: No atrial level shunt detected by color flow Doppler. Additional Comments: There is a moderate pleural effusion in the left lateral region.  LEFT VENTRICLE PLAX 2D LVIDd:         5.95 cm     Diastology LVIDs:         4.50 cm     LV e' medial:    4.79 cm/s LV PW:         0.95 cm     LV E/e' medial:  23.2 LV IVS:        0.95 cm     LV e' lateral:   7.51 cm/s LVOT diam:     1.80 cm     LV E/e' lateral: 14.8 LV SV:         45 LV SV Index:   29 LVOT Area:     2.54 cm  LV Volumes (MOD) LV vol d, MOD A2C: 66.8 ml LV vol d, MOD A4C: 85.3 ml LV vol s, MOD A2C: 41.7 ml LV vol s, MOD A4C: 51.5 ml LV SV MOD A2C:     25.1 ml LV SV MOD A4C:     85.3 ml LV SV MOD BP:      33.1 ml RIGHT VENTRICLE            IVC RV Basal diam:  4.10 cm    IVC diam: 1.80 cm RV S prime:     8.59 cm/s TAPSE (M-mode): 1.8 cm LEFT ATRIUM             Index        RIGHT ATRIUM           Index LA diam:        3.40 cm 2.19 cm/m   RA Area:  20.10 cm LA Vol (A2C):   70.7 ml 45.45 ml/m  RA Volume:   65.70 ml  42.23 ml/m LA Vol (A4C):   50.3 ml 32.33 ml/m LA Biplane Vol: 62.6 ml 40.24 ml/m  AORTIC VALVE AV Area (Vmax):    1.47 cm  AV Area (Vmean):   1.19 cm AV Area (VTI):     1.49 cm AV Vmax:           195.00 cm/s AV Vmean:          127.000 cm/s AV VTI:            0.302 m AV Peak Grad:      15.2 mmHg AV Mean Grad:      8.0 mmHg LVOT Vmax:         113.00 cm/s LVOT Vmean:        59.600 cm/s LVOT VTI:          0.177 m LVOT/AV VTI ratio: 0.59  AORTA Ao Root diam: 2.40 cm Ao Asc diam:  2.90 cm MITRAL VALVE                TRICUSPID VALVE MV Area (PHT): 4.63 cm     TR Peak grad:   35.3 mmHg MV Decel Time: 164 msec     TR Vmax:        297.00 cm/s MV E velocity: 111.00 cm/s MV A velocity: 23.60 cm/s   SHUNTS MV E/A ratio:  4.70         Systemic VTI:  0.18 m                             Systemic Diam: 1.80 cm Toribio Fuel MD Electronically signed by Toribio Fuel MD Signature Date/Time: 08/08/2024/7:51:47 PM    Final    DG Chest Port 1 View Result Date: 08/08/2024 EXAM: 1 VIEW(S) XRAY OF THE CHEST 08/08/2024 05:40:22 AM COMPARISON: 08/06/2024 CLINICAL HISTORY: SOB (shortness of breath) FINDINGS: LUNGS AND PLEURA: Moderate left pleural effusion. Small right pleural effusion. Increased bilateral interstitial and patchy opacities. Left basilar consolidation. No pneumothorax. HEART AND MEDIASTINUM: Cardiomegaly. Aortic valve replacement and aortic stent noted. Atherosclerotic calcifications. BONES AND SOFT TISSUES: Median sternotomy noted. Partially imaged cervicothoracic fixation hardware. No acute osseous abnormality. IMPRESSION: 1. Moderate left pleural effusion and small right pleural effusion. 2. Increased bilateral interstitial and patchy opacities with left basilar consolidation. 3. Cardiomegaly with aortic valve replacement and aortic stent. Atherosclerotic calcifications. Electronically signed by: Evalene Coho MD 08/08/2024 05:54 AM EST RP Workstation: [id number]   DG Chest 2 View Result Date: 08/06/2024 EXAM: 2 VIEW(S) XRAY OF THE CHEST 08/06/2024 10:01:00 PM COMPARISON: 12/15/2020 CLINICAL HISTORY: FINDINGS: LUNGS AND  PLEURA: Findings of the costophrenic angles are noted bilaterally. A small pleural effusion is likely present on the right. Scarring on the left is present as it is stable from 2022. No pneumothorax. HEART AND MEDIASTINUM: Cardiac valve replacement. Cardiomegaly. BONES AND SOFT TISSUES: Multilevel degenerative changes of thoracic spine. Remote right rib fractures. Posterior fixation of upper thoracic spine. Median sternotomy noted. No acute osseous abnormality. IMPRESSION: 1. Small right pleural effusion. 2. Stable left lung scarring. Electronically signed by: Oneil Devonshire MD 08/06/2024 10:16 PM EST RP Workstation: [id number]    Microbiology: Recent Results (from the past 240 hours)  Acid Fast Smear (AFB)     Status: None   Collection Time: 08/09/24 11:44 AM   Specimen: Lung, Left Lower Lobe; Pleural Fluid  Result  Value Ref Range Status   AFB Specimen Processing Concentration  Final   Acid Fast Smear Negative  Final    Comment: (NOTE) Performed At: [facility] [address] [place], [place] [id number] Jennette Shorter MD Ey:[id number]    Source (AFB) PLEURAL  Final    Comment: Performed at [facility], [address], [place], [place] [address]  Body fluid culture w Gram Stain     Status: None   Collection Time: 08/09/24 11:44 AM   Specimen: Lung, Left Lower Lobe; Pleural Fluid  Result Value Ref Range Status   Specimen Description PLEURAL  Final   Special Requests LEFT LOWER LOBE  Final   Gram Stain   Final    RARE WBC PRESENT, PREDOMINANTLY MONONUCLEAR NO ORGANISMS SEEN    Culture   Final    NO GROWTH 3 DAYS Performed at [facility], [address], [place], [place] [address]    Report Status 08/12/2024 FINAL  Final     Labs: Basic Metabolic Panel: Recent Labs  Lab 08/07/24 1613 08/08/24 0245 08/09/24 0255 08/10/24 0329 08/12/24 0347 08/13/24 0303  NA 138 139 136 138 135 135  K 2.8* 3.9 4.6 4.5 3.2* 3.3*  CL 99 102 99 99 98 99  CO2 29 30 26 27 27  25   GLUCOSE 135* 160* 133* 115* 130* 123*  BUN 11 12 15 20  24* 19  CREATININE 1.21* 1.23* 1.57* 1.63* 1.36* 1.21*  CALCIUM  9.1 9.0 9.2 9.2 8.5* 8.7*  MG 1.9 2.0  --   --   --   --    Liver Function Tests: Recent Labs  Lab 08/07/24 1613  AST 26  ALT 10  ALKPHOS 78  BILITOT 1.0  PROT 6.1*  ALBUMIN 3.7   No results for input(s): LIPASE, AMYLASE in the last 168 hours. No results for input(s): AMMONIA in the last 168 hours. CBC: Recent Labs  Lab 08/06/24 2136 08/08/24 0245 08/09/24 0255 08/11/24 0247  WBC 7.1 8.1 8.9 11.0*  NEUTROABS  --  6.4  --   --   HGB 9.6* 9.8* 9.5* 9.7*  HCT 32.0* 33.4* 31.7* 31.6*  MCV 70.2* 71.4* 70.9* 68.1*  PLT 278 265 250 231   Cardiac Enzymes: No results for input(s): CKTOTAL, CKMB, CKMBINDEX, TROPONINI in the last 168 hours. BNP: BNP (last 3 results) No results for input(s): BNP in the last 8760 hours.  ProBNP (last 3 results) Recent Labs    08/06/24 2136 08/08/24 0245  PROBNP 6,114.0* 5,865.0*    CBG: No results for input(s): GLUCAP in the last 168 hours.     Signed:  Sigurd Pac MD.  [facility] 08/13/2024, 10:21 AM        [1]  Allergies Allergen Reactions   Fig Extract [Ficus] Anaphylaxis   Citalopram Hydrobromide Other (See Comments)    Pt unsure if this is accurate   Sulfa Antibiotics Other (See Comments)    Severe nervousness.   Prednisone Other (See Comments)    Other reaction(s): Other (See Comments) So nervous I could climb the walls So nervous I could climb the walls

## 2024-08-13 NOTE — Plan of Care (Signed)

## 2024-09-24 LAB — ACID FAST CULTURE WITH REFLEXED SENSITIVITIES (MYCOBACTERIA): Acid Fast Culture: NEGATIVE
